# Patient Record
Sex: Female | Born: 1935 | Race: White | Hispanic: No | Smoking: Current every day smoker
Health system: Southern US, Community
[De-identification: ages and names within clinical notes are randomized; demographics above are authoritative.]

## PROBLEM LIST (undated history)

## (undated) DIAGNOSIS — K7460 Unspecified cirrhosis of liver: Secondary | ICD-10-CM

## (undated) DIAGNOSIS — IMO0002 Reserved for concepts with insufficient information to code with codable children: Secondary | ICD-10-CM

## (undated) DIAGNOSIS — Z923 Personal history of irradiation: Secondary | ICD-10-CM

## (undated) DIAGNOSIS — C50919 Malignant neoplasm of unspecified site of unspecified female breast: Secondary | ICD-10-CM

## (undated) DIAGNOSIS — M1990 Unspecified osteoarthritis, unspecified site: Secondary | ICD-10-CM

## (undated) DIAGNOSIS — Z972 Presence of dental prosthetic device (complete) (partial): Secondary | ICD-10-CM

## (undated) DIAGNOSIS — Z9221 Personal history of antineoplastic chemotherapy: Secondary | ICD-10-CM

## (undated) DIAGNOSIS — Z09 Encounter for follow-up examination after completed treatment for conditions other than malignant neoplasm: Secondary | ICD-10-CM

## (undated) DIAGNOSIS — C4490 Unspecified malignant neoplasm of skin, unspecified: Secondary | ICD-10-CM

## (undated) HISTORY — DX: Malignant neoplasm of unspecified site of unspecified female breast: C50.919

## (undated) HISTORY — PX: CHOLECYSTECTOMY: SHX55

---

## 1999-07-04 ENCOUNTER — Other Ambulatory Visit: Admission: RE | Admit: 1999-07-04 | Discharge: 1999-07-04 | Payer: Self-pay | Admitting: Obstetrics and Gynecology

## 2003-03-29 ENCOUNTER — Other Ambulatory Visit: Admission: RE | Admit: 2003-03-29 | Discharge: 2003-03-29 | Payer: Self-pay | Admitting: Obstetrics and Gynecology

## 2006-04-09 ENCOUNTER — Other Ambulatory Visit: Payer: Self-pay

## 2006-04-09 ENCOUNTER — Emergency Department: Payer: Self-pay | Admitting: Emergency Medicine

## 2009-06-01 DIAGNOSIS — C50919 Malignant neoplasm of unspecified site of unspecified female breast: Secondary | ICD-10-CM

## 2009-06-01 DIAGNOSIS — IMO0001 Reserved for inherently not codable concepts without codable children: Secondary | ICD-10-CM

## 2009-06-01 DIAGNOSIS — Z09 Encounter for follow-up examination after completed treatment for conditions other than malignant neoplasm: Secondary | ICD-10-CM

## 2009-06-01 HISTORY — DX: Malignant neoplasm of unspecified site of unspecified female breast: C50.919

## 2009-06-01 HISTORY — PX: BREAST EXCISIONAL BIOPSY: SUR124

## 2009-06-01 HISTORY — DX: Encounter for follow-up examination after completed treatment for conditions other than malignant neoplasm: Z09

## 2009-06-01 HISTORY — DX: Reserved for inherently not codable concepts without codable children: IMO0001

## 2009-11-29 ENCOUNTER — Ambulatory Visit: Payer: Self-pay | Admitting: Oncology

## 2009-12-23 ENCOUNTER — Ambulatory Visit: Payer: Self-pay | Admitting: Vascular Surgery

## 2009-12-24 ENCOUNTER — Ambulatory Visit: Payer: Self-pay | Admitting: Oncology

## 2009-12-30 ENCOUNTER — Ambulatory Visit: Payer: Self-pay | Admitting: Oncology

## 2010-01-02 ENCOUNTER — Inpatient Hospital Stay: Payer: Self-pay | Admitting: Internal Medicine

## 2010-01-30 ENCOUNTER — Ambulatory Visit: Payer: Self-pay | Admitting: Oncology

## 2010-03-01 ENCOUNTER — Ambulatory Visit: Payer: Self-pay | Admitting: Oncology

## 2010-03-15 ENCOUNTER — Inpatient Hospital Stay: Payer: Self-pay | Admitting: Oncology

## 2010-04-01 ENCOUNTER — Ambulatory Visit: Payer: Self-pay | Admitting: Oncology

## 2010-05-01 ENCOUNTER — Ambulatory Visit: Payer: Self-pay | Admitting: Oncology

## 2010-06-01 ENCOUNTER — Ambulatory Visit: Payer: Self-pay | Admitting: Oncology

## 2010-07-02 ENCOUNTER — Ambulatory Visit: Payer: Self-pay | Admitting: Oncology

## 2010-07-31 ENCOUNTER — Ambulatory Visit: Payer: Self-pay | Admitting: Oncology

## 2010-08-31 ENCOUNTER — Ambulatory Visit: Payer: Self-pay | Admitting: Oncology

## 2010-09-30 ENCOUNTER — Ambulatory Visit: Payer: Self-pay | Admitting: Oncology

## 2010-10-31 ENCOUNTER — Ambulatory Visit: Payer: Self-pay | Admitting: Oncology

## 2010-11-30 ENCOUNTER — Ambulatory Visit: Payer: Self-pay | Admitting: Oncology

## 2010-12-29 ENCOUNTER — Observation Stay: Payer: Self-pay | Admitting: Internal Medicine

## 2010-12-30 DIAGNOSIS — R079 Chest pain, unspecified: Secondary | ICD-10-CM

## 2010-12-31 ENCOUNTER — Ambulatory Visit: Payer: Self-pay | Admitting: Oncology

## 2011-01-31 ENCOUNTER — Ambulatory Visit: Payer: Self-pay | Admitting: Oncology

## 2011-02-23 ENCOUNTER — Ambulatory Visit: Payer: Self-pay | Admitting: Vascular Surgery

## 2011-03-02 ENCOUNTER — Ambulatory Visit: Payer: Self-pay | Admitting: Oncology

## 2011-05-14 ENCOUNTER — Ambulatory Visit: Payer: Self-pay | Admitting: Oncology

## 2011-06-02 ENCOUNTER — Ambulatory Visit: Payer: Self-pay | Admitting: Oncology

## 2011-08-18 ENCOUNTER — Ambulatory Visit: Payer: Self-pay | Admitting: Unknown Physician Specialty

## 2011-11-23 ENCOUNTER — Ambulatory Visit: Payer: Self-pay | Admitting: Oncology

## 2011-11-23 LAB — CBC CANCER CENTER
Basophil #: 0 x10 3/mm (ref 0.0–0.1)
HCT: 39.2 % (ref 35.0–47.0)
Lymphocyte #: 1.3 x10 3/mm (ref 1.0–3.6)
Lymphocyte %: 28.6 %
MCHC: 32.7 g/dL (ref 32.0–36.0)
MCV: 97 fL (ref 80–100)
Monocyte #: 0.3 x10 3/mm (ref 0.2–0.9)
Monocyte %: 7.3 %
Neutrophil %: 61.3 %
RDW: 14.3 % (ref 11.5–14.5)
WBC: 4.5 x10 3/mm (ref 3.6–11.0)

## 2011-11-30 ENCOUNTER — Ambulatory Visit: Payer: Self-pay | Admitting: Oncology

## 2012-05-31 ENCOUNTER — Ambulatory Visit: Payer: Self-pay | Admitting: Oncology

## 2012-05-31 LAB — CBC CANCER CENTER
Basophil #: 0 x10 3/mm (ref 0.0–0.1)
Eosinophil #: 0.1 x10 3/mm (ref 0.0–0.7)
Lymphocyte %: 32.7 %
MCHC: 33.6 g/dL (ref 32.0–36.0)
MCV: 95 fL (ref 80–100)
Neutrophil %: 55.4 %
RBC: 4.32 10*6/uL (ref 3.80–5.20)
RDW: 14.4 % (ref 11.5–14.5)
WBC: 3.8 x10 3/mm (ref 3.6–11.0)

## 2012-06-01 ENCOUNTER — Ambulatory Visit: Payer: Self-pay | Admitting: Oncology

## 2012-06-01 HISTORY — PX: BREAST BIOPSY: SHX20

## 2012-06-02 LAB — CANCER ANTIGEN 27.29: CA 27.29: 14.1 U/mL (ref 0.0–38.6)

## 2012-11-28 ENCOUNTER — Ambulatory Visit: Payer: Self-pay | Admitting: Oncology

## 2012-11-29 ENCOUNTER — Ambulatory Visit: Payer: Self-pay | Admitting: Oncology

## 2012-11-29 LAB — CANCER ANTIGEN 27.29: CA 27.29: 10.9 U/mL

## 2012-12-30 ENCOUNTER — Ambulatory Visit: Payer: Self-pay | Admitting: Oncology

## 2013-01-19 DIAGNOSIS — C50919 Malignant neoplasm of unspecified site of unspecified female breast: Secondary | ICD-10-CM | POA: Insufficient documentation

## 2013-10-04 ENCOUNTER — Ambulatory Visit: Payer: Self-pay | Admitting: Oncology

## 2013-10-05 LAB — CANCER ANTIGEN 27.29: CA 27.29: 12.6 U/mL (ref 0.0–38.6)

## 2013-10-30 ENCOUNTER — Ambulatory Visit: Payer: Self-pay | Admitting: Oncology

## 2013-12-27 ENCOUNTER — Ambulatory Visit: Payer: Self-pay | Admitting: Family Medicine

## 2014-01-31 ENCOUNTER — Ambulatory Visit: Payer: Self-pay | Admitting: Oncology

## 2014-03-01 ENCOUNTER — Ambulatory Visit: Payer: Self-pay | Admitting: Oncology

## 2014-04-11 ENCOUNTER — Ambulatory Visit: Payer: Self-pay | Admitting: Oncology

## 2014-04-19 LAB — CANCER ANTIGEN 27.29: CA 27.29: 6.3 U/mL (ref 0.0–38.6)

## 2014-05-01 ENCOUNTER — Ambulatory Visit: Payer: Self-pay | Admitting: Oncology

## 2014-05-30 DIAGNOSIS — R252 Cramp and spasm: Secondary | ICD-10-CM | POA: Insufficient documentation

## 2014-06-01 DIAGNOSIS — M8580 Other specified disorders of bone density and structure, unspecified site: Secondary | ICD-10-CM | POA: Insufficient documentation

## 2014-07-30 DIAGNOSIS — M79606 Pain in leg, unspecified: Secondary | ICD-10-CM | POA: Insufficient documentation

## 2014-07-30 DIAGNOSIS — M79669 Pain in unspecified lower leg: Secondary | ICD-10-CM | POA: Insufficient documentation

## 2014-10-19 ENCOUNTER — Inpatient Hospital Stay: Payer: Medicare Other | Attending: Oncology | Admitting: Oncology

## 2014-10-19 ENCOUNTER — Encounter: Payer: Self-pay | Admitting: Oncology

## 2014-10-19 VITALS — BP 161/70 | HR 60 | Temp 97.5°F | Resp 18 | Wt 176.6 lb

## 2014-10-19 DIAGNOSIS — Z170 Estrogen receptor positive status [ER+]: Secondary | ICD-10-CM | POA: Diagnosis not present

## 2014-10-19 DIAGNOSIS — M810 Age-related osteoporosis without current pathological fracture: Secondary | ICD-10-CM

## 2014-10-19 DIAGNOSIS — M8580 Other specified disorders of bone density and structure, unspecified site: Secondary | ICD-10-CM | POA: Diagnosis not present

## 2014-10-19 DIAGNOSIS — Z79899 Other long term (current) drug therapy: Secondary | ICD-10-CM | POA: Diagnosis not present

## 2014-10-19 DIAGNOSIS — Z79811 Long term (current) use of aromatase inhibitors: Secondary | ICD-10-CM | POA: Insufficient documentation

## 2014-10-19 DIAGNOSIS — D696 Thrombocytopenia, unspecified: Secondary | ICD-10-CM

## 2014-10-19 DIAGNOSIS — C50911 Malignant neoplasm of unspecified site of right female breast: Secondary | ICD-10-CM

## 2014-10-19 DIAGNOSIS — C50919 Malignant neoplasm of unspecified site of unspecified female breast: Secondary | ICD-10-CM | POA: Diagnosis present

## 2014-10-19 DIAGNOSIS — F17210 Nicotine dependence, cigarettes, uncomplicated: Secondary | ICD-10-CM | POA: Insufficient documentation

## 2014-11-01 ENCOUNTER — Telehealth: Payer: Self-pay | Admitting: Oncology

## 2014-11-01 NOTE — Telephone Encounter (Signed)
[name], I have called [facility] to request images.  Someone from [facility] will pick up the images and let you know when they can schedule.   FYI:  If [facility] knows previous images are done at [facility] they will request and pick up images themselves.     Thanks

## 2014-11-01 NOTE — Telephone Encounter (Signed)
Patient had her last mammogram at a different facility and we need to get images before we can schedule her next mammo. (Mammo was performed at [facility] across the [address] from hospital. [phone])

## 2014-11-02 DIAGNOSIS — C50919 Malignant neoplasm of unspecified site of unspecified female breast: Secondary | ICD-10-CM | POA: Insufficient documentation

## 2014-11-02 NOTE — Progress Notes (Signed)
[place]  [phone] Fax:([phone]) [phone]  ID: [name] OB: 1936/01/04  MR#: [id number]  UKG#:[id number]  No care team member to display  CHIEF COMPLAINT:  Chief Complaint  Patient presents with  . Follow-up    Breast Ca    INTERVAL HISTORY: Patient returns to clinic today for her routine six-month follow-up. She currently feels well and is asymptomatic. She denies any fevers.  She has no neurologic complaints.  She denies any chest pain, cough, or shortness of breath.  She has a good appetite and denies any nausea, vomiting, constipation, or diarrhea.  She has no urinary complaints.  Patient offers no specific complaints today.   REVIEW OF SYSTEMS:   Review of Systems  Constitutional: Negative.   Respiratory: Negative.   Cardiovascular: Negative.     As per HPI. Otherwise, a complete review of systems is negatve.  PAST MEDICAL HISTORY: Past Medical History  Diagnosis Date  . Breast cancer     PAST SURGICAL HISTORY: Past Surgical History  Procedure Laterality Date  . Cholecystectomy      FAMILY HISTORY: Father with prostate cancer, maternal aunt with cancer of unknown type.     ADVANCED DIRECTIVES:    HEALTH MAINTENANCE: History  Substance Use Topics  . Smoking status: Current Every Day Smoker -- 0.50 packs/day    Types: Cigarettes  . Smokeless tobacco: Never Used  . Alcohol Use: No     Comment: occasional glass of wine     Colonoscopy:  PAP:  Bone density:  Lipid panel:  No Known Allergies  Current Outpatient Prescriptions  Medication Sig Dispense Refill  . letrozole (FEMARA) 2.5 MG tablet      No current facility-administered medications for this visit.    OBJECTIVE: Filed Vitals:   10/19/14 1235  BP: 161/70  Pulse: 60  Temp: 97.5 F (36.4 C)  Resp: 18     There is no height on file to calculate BMI.    ECOG FS:0 - Asymptomatic  General: Well-developed, well-nourished, no acute distress. Eyes:  anicteric sclera. Breasts: Patient refused breast exam today. Lungs: Clear to auscultation bilaterally. Heart: Regular rate and rhythm. No rubs, murmurs, or gallops. Abdomen: Soft, nontender, nondistended. No organomegaly noted, normoactive bowel sounds. Musculoskeletal: No edema, cyanosis, or clubbing. Neuro: Alert, answering all questions appropriately. Cranial nerves grossly intact. Skin: No rashes or petechiae noted. Psych: Normal affect.   LAB RESULTS:  No results found for: NA, K, CL, CO2, GLUCOSE, BUN, CREATININE, CALCIUM, PROT, ALBUMIN, AST, ALT, ALKPHOS, BILITOT, GFRNONAA, GFRAA  Lab Results  Component Value Date   WBC 3.8 05/31/2012   NEUTROABS 2.1 05/31/2012   HGB 13.7 05/31/2012   HCT 40.9 05/31/2012   MCV 95 05/31/2012   PLT 121* 05/31/2012     STUDIES: No results found.  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  PLAN:    1.  Breast cancer:  Anastrozole was discontinued secondary to leg cramps and patient was switched to letrozole which she will complete in approximately October 2016.  Patient has not had a mammogram in greater than 1 year, therefore will schedule one in the next 1-2 weeks. Return to clinic in 6 months for routine evaluation. 2.  Osteopenia: Patient's bone mineral density on December 27, 2013 was reported -1.6.  Repeat in July 2016. 2.  Anxiety: Continue Xanax. 3.  Thrombocytopenia: Stable, secondary chemotherapy, monitor.  Patient expressed understanding and was in agreement with this plan. She also understands that She  can call clinic at any time with any questions, concerns, or complaints.   Breast cancer   Staging form: Breast, AJCC 7th Edition     Clinical stage from 11/02/2014: Stage IIA (T1b, N1, M0) - Signed by Lloyd Huger, MD on 11/02/2014   Lloyd Huger, MD   11/02/2014 4:05 PM

## 2014-11-15 NOTE — Telephone Encounter (Signed)
I contacted [facility] today to inquire about images on [facility] said they still had not received them and she could see the request hanging since June 2. She said she would call immediately to find out why they hadn't come yet.

## 2014-11-21 DIAGNOSIS — Q619 Cystic kidney disease, unspecified: Secondary | ICD-10-CM | POA: Diagnosis present

## 2014-11-21 DIAGNOSIS — R161 Splenomegaly, not elsewhere classified: Secondary | ICD-10-CM | POA: Insufficient documentation

## 2014-11-24 ENCOUNTER — Encounter: Payer: Self-pay | Admitting: Emergency Medicine

## 2014-11-24 ENCOUNTER — Emergency Department: Payer: Medicare Other

## 2014-11-24 DIAGNOSIS — Z720 Tobacco use: Secondary | ICD-10-CM | POA: Diagnosis not present

## 2014-11-24 DIAGNOSIS — M545 Low back pain: Secondary | ICD-10-CM | POA: Diagnosis not present

## 2014-11-24 DIAGNOSIS — N390 Urinary tract infection, site not specified: Secondary | ICD-10-CM | POA: Insufficient documentation

## 2014-11-24 DIAGNOSIS — Z792 Long term (current) use of antibiotics: Secondary | ICD-10-CM | POA: Diagnosis not present

## 2014-11-24 DIAGNOSIS — G8929 Other chronic pain: Secondary | ICD-10-CM | POA: Diagnosis not present

## 2014-11-24 DIAGNOSIS — R109 Unspecified abdominal pain: Secondary | ICD-10-CM | POA: Diagnosis present

## 2014-11-24 LAB — CBC
HCT: 41.9 % (ref 35.0–47.0)
HEMOGLOBIN: 14 g/dL (ref 12.0–16.0)
MCH: 31.2 pg (ref 26.0–34.0)
MCHC: 33.4 g/dL (ref 32.0–36.0)
MCV: 93.5 fL (ref 80.0–100.0)
Platelets: 124 10*3/uL — ABNORMAL LOW (ref 150–440)
RBC: 4.48 MIL/uL (ref 3.80–5.20)
RDW: 14.9 % — ABNORMAL HIGH (ref 11.5–14.5)
WBC: 6.6 10*3/uL (ref 3.6–11.0)

## 2014-11-24 LAB — COMPREHENSIVE METABOLIC PANEL
ALT: 33 U/L (ref 14–54)
AST: 45 U/L — AB (ref 15–41)
Albumin: 4.3 g/dL (ref 3.5–5.0)
Alkaline Phosphatase: 93 U/L (ref 38–126)
Anion gap: 11 (ref 5–15)
BUN: 24 mg/dL — ABNORMAL HIGH (ref 6–20)
CO2: 25 mmol/L (ref 22–32)
Calcium: 9.2 mg/dL (ref 8.9–10.3)
Chloride: 105 mmol/L (ref 101–111)
Creatinine, Ser: 0.78 mg/dL (ref 0.44–1.00)
GFR calc Af Amer: >60 mL/min (ref >60)
GFR calc non Af Amer: >60 mL/min (ref >60)
Glucose, Bld: 159 mg/dL — ABNORMAL HIGH (ref 65–99)
Potassium: 3.7 mmol/L (ref 3.5–5.1)
Sodium: 141 mmol/L (ref 135–145)
Total Bilirubin: 0.7 mg/dL (ref 0.3–1.2)
Total Protein: 7.3 g/dL (ref 6.5–8.1)

## 2014-11-24 LAB — URINALYSIS COMPLETE WITH MICROSCOPIC (ARMC ONLY)
BILIRUBIN URINE: NEGATIVE
Glucose, UA: NEGATIVE mg/dL
HGB URINE DIPSTICK: NEGATIVE
KETONES UR: NEGATIVE mg/dL
NITRITE: NEGATIVE
PROTEIN: NEGATIVE mg/dL
Specific Gravity, Urine: 1.029 (ref 1.005–1.030)
pH: 5 (ref 5.0–8.0)

## 2014-11-24 MED ORDER — ONDANSETRON 8 MG PO TBDP
8.0000 mg | ORAL_TABLET | Freq: Once | ORAL | Status: AC
  Administered 2014-11-24: 8 mg via ORAL

## 2014-11-24 MED ORDER — ONDANSETRON 8 MG PO TBDP
ORAL_TABLET | ORAL | Status: AC
  Administered 2014-11-24: 8 mg via ORAL
  Filled 2014-11-24: qty 1

## 2014-11-24 MED ORDER — OXYCODONE-ACETAMINOPHEN 5-325 MG PO TABS
ORAL_TABLET | ORAL | Status: AC
  Administered 2014-11-24: 1 via ORAL
  Filled 2014-11-24: qty 1

## 2014-11-24 MED ORDER — OXYCODONE-ACETAMINOPHEN 5-325 MG PO TABS
1.0000 | ORAL_TABLET | Freq: Once | ORAL | Status: AC
  Administered 2014-11-24: 1 via ORAL

## 2014-11-24 NOTE — ED Notes (Signed)
Pt presents to ER alert and in NAD. Pt reports she was told Wed that she had large kidney stones, pt had an [place] Wed. Pt states worse pain today.

## 2014-11-24 NOTE — ED Notes (Signed)
Pt reports she has rod in back and doesn't know if she can have CT scan, spoke with MD Thomasene Lot and he stated ok to CT scan.

## 2014-11-25 ENCOUNTER — Emergency Department
Admission: EM | Admit: 2014-11-25 | Discharge: 2014-11-25 | Disposition: A | Discharge status: Home / Self Care | Payer: Medicare Other | Attending: Emergency Medicine | Admitting: Emergency Medicine

## 2014-11-25 DIAGNOSIS — M545 Low back pain, unspecified: Secondary | ICD-10-CM

## 2014-11-25 DIAGNOSIS — N390 Urinary tract infection, site not specified: Secondary | ICD-10-CM | POA: Diagnosis not present

## 2014-11-25 DIAGNOSIS — R109 Unspecified abdominal pain: Secondary | ICD-10-CM

## 2014-11-25 MED ORDER — HYDROCODONE-ACETAMINOPHEN 5-325 MG PO TABS: 1.0000 | ORAL_TABLET | ORAL | Status: DC | PRN

## 2014-11-25 MED ORDER — CEPHALEXIN 500 MG PO CAPS
500.0000 mg | ORAL_CAPSULE | Freq: Once | ORAL | Status: AC
  Administered 2014-11-25: 500 mg via ORAL

## 2014-11-25 MED ORDER — CEPHALEXIN 500 MG PO CAPS
ORAL_CAPSULE | ORAL | Status: AC
  Filled 2014-11-25: qty 1

## 2014-11-25 MED ORDER — HYDROCODONE-ACETAMINOPHEN 5-325 MG PO TABS
2.0000 | ORAL_TABLET | Freq: Once | ORAL | Status: AC
  Administered 2014-11-25: 2 via ORAL

## 2014-11-25 MED ORDER — CEPHALEXIN 500 MG PO CAPS: 500.0000 mg | ORAL_CAPSULE | Freq: Two times a day (BID) | ORAL | Status: DC

## 2014-11-25 MED ORDER — HYDROCODONE-ACETAMINOPHEN 5-325 MG PO TABS
ORAL_TABLET | ORAL | Status: AC
  Filled 2014-11-25: qty 2

## 2014-11-25 NOTE — ED Provider Notes (Signed)
[facility] Emergency Department Provider Note  ____________________________________________  Time seen: Approximately 1:36 AM  I have reviewed the triage vital signs and the nursing notes.   HISTORY  Chief Complaint Flank Pain    HPI [name] is a 79 y.o. female with chronic lower back pain who presents with gradual onset of worsening acute bilateral lower back/flank pain.  She has been told in the past that she has large stones in her kidneys.  She denies dysuria.  She denies chest pain, shortness of breath, and abdominal pain.  She denies nausea and vomiting.  She states that at first she just thought it was worsening of her chronic pain, but was concerned about her history of kidney stones.  She says that she is cold a lot but denies fever.  She describes the pain as moderate.   Past Medical History  Diagnosis Date  . Breast cancer     Patient Active Problem List   Diagnosis Date Noted  . Breast cancer 11/02/2014    Past Surgical History  Procedure Laterality Date  . Cholecystectomy      Current Outpatient Rx  Name  Route  Sig  Dispense  Refill  . cephALEXin (KEFLEX) 500 MG capsule   Oral   Take 1 capsule (500 mg total) by mouth 2 (two) times daily.   14 capsule   0   . HYDROcodone-acetaminophen (NORCO/VICODIN) 5-325 MG per tablet   Oral   Take 1-2 tablets by mouth every 4 (four) hours as needed for moderate pain.   15 tablet   0   . letrozole (FEMARA) 2.5 MG tablet                 Allergies Review of patient's allergies indicates no known allergies.  History reviewed. No pertinent family history.  Social History History  Substance Use Topics  . Smoking status: Current Every Day Smoker -- 0.50 packs/day    Types: Cigarettes  . Smokeless tobacco: Never Used  . Alcohol Use: No     Comment: occasional glass of wine    Review of Systems Constitutional: No fever/chills Eyes: No visual changes. ENT: No sore  throat. Cardiovascular: Denies chest pain. Respiratory: Denies shortness of breath. Gastrointestinal: No abdominal pain.  No nausea, no vomiting.  No diarrhea.  No constipation. Genitourinary: Negative for dysuria. Musculoskeletal: Acute on chronic lower back pain bilaterally Skin: Negative for rash. Neurological: Negative for headaches, focal weakness or numbness.  10-point ROS otherwise negative.  ____________________________________________   PHYSICAL EXAM:  VITAL SIGNS: ED Triage Vitals  Enc Vitals Group     BP 11/24/14 2222 180/82 mmHg     Pulse Rate 11/24/14 2222 79     Resp 11/24/14 2222 20     Temp 11/24/14 2222 97.8 F (36.6 C)     Temp Source 11/24/14 2222 Oral     SpO2 11/24/14 2222 95 %     Weight 11/24/14 2222 171 lb (77.565 kg)     Height 11/24/14 2222 5\' 7"  (1.702 m)     Head Cir --      Peak Flow --      Pain Score 11/24/14 2224 10     Pain Loc --      Pain Edu? --      Excl. in [place]? --     Constitutional: Alert and oriented. Well appearing and in no acute distress. Eyes: Conjunctivae are normal. PERRL. EOMI. Head: Atraumatic. Nose: No congestion/rhinnorhea. Mouth/Throat: Mucous membranes are  moist.  Oropharynx non-erythematous. Neck: No stridor.   Cardiovascular: Normal rate, regular rhythm. Grossly normal heart sounds.  Good peripheral circulation. Respiratory: Normal respiratory effort.  No retractions. Lungs CTAB. Gastrointestinal: Soft and nontender. No distention. No abdominal bruits.  Mild bilateral CVA tenderness. Musculoskeletal: No lower extremity tenderness nor edema.  No joint effusions. Neurologic:  Normal speech and language. No gross focal neurologic deficits are appreciated. Speech is normal. Skin:  Skin is warm, dry and intact. No rash noted.   ____________________________________________   LABS (all labs ordered are listed, but only abnormal results are displayed)  Labs Reviewed  COMPREHENSIVE METABOLIC PANEL - Abnormal; Notable  for the following:    Glucose, Bld 159 (*)    BUN 24 (*)    AST 45 (*)    All other components within normal limits  URINALYSIS COMPLETEWITH MICROSCOPIC ([facility] ONLY) - Abnormal; Notable for the following:    Color, Urine YELLOW (*)    APPearance HAZY (*)    Leukocytes, UA 1+ (*)    Bacteria, UA RARE (*)    Squamous Epithelial / LPF 6-30 (*)    All other components within normal limits  CBC - Abnormal; Notable for the following:    RDW 14.9 (*)    Platelets 124 (*)    All other components within normal limits  URINE CULTURE   ____________________________________________  EKG  Not indicated ____________________________________________  RADIOLOGY  Ct Renal Stone Study  11/24/2014   CLINICAL DATA:  Acute onset of chronic lower back pain. Initial encounter.  EXAM: CT ABDOMEN AND PELVIS WITHOUT CONTRAST  TECHNIQUE: Multidetector CT imaging of the abdomen and pelvis was performed following the standard protocol without IV contrast.  COMPARISON:  CT of the abdomen and pelvis from 03/15/2010  FINDINGS: The visualized lung bases are clear.  Mildly nodular hepatic contour is compatible with cirrhosis. The spleen is enlarged, measuring 14.0 cm in length. The patient is status post cholecystectomy, with clips noted at the gallbladder fossa. The pancreas and adrenal glands are unremarkable. Gastric varices are seen.  Scattered left renal parapelvic cysts are seen. The kidneys are otherwise grossly unremarkable. There is no evidence of hydronephrosis. No renal or ureteral stones are seen. No perinephric stranding is appreciated.  No free fluid is identified. The small bowel is unremarkable in appearance. The stomach is within normal limits. No acute vascular abnormalities are seen. Relatively diffuse calcification is noted along the abdominal aorta and its branches.  The appendix is normal in caliber and contains trace air, without evidence for appendicitis. Scattered diverticulosis is noted along the  distal descending and proximal sigmoid colon, without evidence of diverticulitis.  The bladder is decompressed and not well assessed. The uterus is grossly unremarkable in appearance. The ovaries are relatively symmetric. No inguinal lymphadenopathy is seen.  No acute osseous abnormalities are identified. The patient is status post lumbar spinal fusion at L3-L5. There is diffuse osteopenia of visualized osseous structures.  IMPRESSION: 1. No acute abnormality seen to explain the patient's symptoms. 2. Findings of hepatic cirrhosis. Splenomegaly noted. Gastric varices seen. 3. Scattered left renal parapelvic cysts noted. 4. Relatively diffuse calcification along the abdominal aorta and its branches. 5. Scattered diverticulosis along the distal descending and proximal sigmoid colon, without evidence of diverticulitis. 6. Diffuse osteopenia of visualized osseous structures. Status post lumbar spinal fusion at L3-L5.   Electronically Signed   By: Garald Balding M.D.   On: 11/24/2014 23:30    ____________________________________________   PROCEDURES  Procedure(s) performed: None  Critical Care performed: No ____________________________________________   INITIAL IMPRESSION / ASSESSMENT AND PLAN / ED COURSE  Pertinent labs & imaging results that were available during my care of the patient were reviewed by me and considered in my medical decision making (see chart for details).  The patient is well-appearing and in no acute distress with reassuring vital signs.  I explained to her that she has no kidney stones on her CT scan.  I explained she may have a mild urinary tract infection that we will treat empirically with antibiotics while also sending the urine to culture.  I will treat her with a small number of Norco to help with her discomfort and advised her to follow up as soon as possible with her regular doctor.  I do not believe that she has an emergent medical condition.  She understands and agrees  with the plan.  ____________________________________________  FINAL CLINICAL IMPRESSION(S) / ED DIAGNOSES  Final diagnoses:  Bilateral low back pain without sciatica  UTI (urinary tract infection), uncomplicated      NEW MEDICATIONS STARTED DURING THIS VISIT:  New Prescriptions   CEPHALEXIN (KEFLEX) 500 MG CAPSULE    Take 1 capsule (500 mg total) by mouth 2 (two) times daily.   HYDROCODONE-ACETAMINOPHEN (NORCO/VICODIN) 5-325 MG PER TABLET    Take 1-2 tablets by mouth every 4 (four) hours as needed for moderate pain.     Hinda Kehr, MD 11/25/14 [phone]

## 2014-11-25 NOTE — Discharge Instructions (Signed)
As we discussed, your workup today was reassuring.  You may have a mild urinary tract infection for which we prescribed antibiotics and sent your urine for culture.  We are also providing a small number of stronger pain pills to help with your discomfort until you can follow-up with your primary care doctor.  If you develop new or worsening symptoms that concern you, please return to the emergency department.  We also provided a copy of your CT scan results to take your doctor; since there is no evidence of kidney stones, we thought you might want to pass this information along.   Back Pain, Adult Back pain is very common. The pain often gets better over time. The cause of back pain is usually not dangerous. Most people can learn to manage their back pain on their own.  HOME CARE   Stay active. Start with short walks on flat ground if you can. Try to walk farther each day.  Do not sit, drive, or stand in one place for more than 30 minutes. Do not stay in bed.  Do not avoid exercise or work. Activity can help your back heal faster.  Be careful when you bend or lift an object. Bend at your knees, keep the object close to you, and do not twist.  Sleep on a firm mattress. Lie on your side, and bend your knees. If you lie on your back, put a pillow under your knees.  Only take medicines as told by your doctor.  Put ice on the injured area.  Put ice in a plastic bag.  Place a towel between your skin and the bag.  Leave the ice on for 15-20 minutes, 03-04 times a day for the first 2 to 3 days. After that, you can switch between ice and heat packs.  Ask your doctor about back exercises or massage.  Avoid feeling anxious or stressed. Find good ways to deal with stress, such as exercise. GET HELP RIGHT AWAY IF:   Your pain does not go away with rest or medicine.  Your pain does not go away in 1 week.  You have new problems.  You do not feel well.  The pain spreads into your legs.  You  cannot control when you poop (bowel movement) or pee (urinate).  Your arms or legs feel weak or lose feeling (numbness).  You feel sick to your stomach (nauseous) or throw up (vomit).  You have belly (abdominal) pain.  You feel like you may pass out (faint). MAKE SURE YOU:   Understand these instructions.  Will watch your condition.  Will get help right away if you are not doing well or get worse. Document Released: 11/04/2007 Document Revised: 08/10/2011 Document Reviewed: 09/19/2013 [facility] Patient Information 2015 , [place]. This information is not intended to replace advice given to you by your health care provider. Make sure you discuss any questions you have with your health care provider.  Urinary Tract Infection Urinary tract infections (UTIs) can develop anywhere along your urinary tract. Your urinary tract is your body's drainage system for removing wastes and extra water. Your urinary tract includes two kidneys, two ureters, a bladder, and a urethra. Your kidneys are a pair of bean-shaped organs. Each kidney is about the size of your fist. They are located below your ribs, one on each side of your spine. CAUSES Infections are caused by microbes, which are microscopic organisms, including fungi, viruses, and bacteria. These organisms are so small that they can only  be seen through a microscope. Bacteria are the microbes that most commonly cause UTIs. SYMPTOMS  Symptoms of UTIs may vary by age and gender of the patient and by the location of the infection. Symptoms in young women typically include a frequent and intense urge to urinate and a painful, burning feeling in the bladder or urethra during urination. Older women and men are more likely to be tired, shaky, and weak and have muscle aches and abdominal pain. A fever may mean the infection is in your kidneys. Other symptoms of a kidney infection include pain in your back or sides below the ribs, nausea, and  vomiting. DIAGNOSIS To diagnose a UTI, your caregiver will ask you about your symptoms. Your caregiver also will ask to provide a urine sample. The urine sample will be tested for bacteria and white blood cells. White blood cells are made by your body to help fight infection. TREATMENT  Typically, UTIs can be treated with medication. Because most UTIs are caused by a bacterial infection, they usually can be treated with the use of antibiotics. The choice of antibiotic and length of treatment depend on your symptoms and the type of bacteria causing your infection. HOME CARE INSTRUCTIONS  If you were prescribed antibiotics, take them exactly as your caregiver instructs you. Finish the medication even if you feel better after you have only taken some of the medication.  Drink enough water and fluids to keep your urine clear or pale yellow.  Avoid caffeine, tea, and carbonated beverages. They tend to irritate your bladder.  Empty your bladder often. Avoid holding urine for long periods of time.  Empty your bladder before and after sexual intercourse.  After a bowel movement, women should cleanse from front to back. Use each tissue only once. SEEK MEDICAL CARE IF:   You have back pain.  You develop a fever.  Your symptoms do not begin to resolve within 3 days. SEEK IMMEDIATE MEDICAL CARE IF:   You have severe back pain or lower abdominal pain.  You develop chills.  You have nausea or vomiting.  You have continued burning or discomfort with urination. MAKE SURE YOU:   Understand these instructions.  Will watch your condition.  Will get help right away if you are not doing well or get worse. Document Released: 02/25/2005 Document Revised: 11/17/2011 Document Reviewed: 06/26/2011 [facility] Patient Information 2015 [place], [place]. This information is not intended to replace advice given to you by your health care provider. Make sure you discuss any questions you have with your health  care provider.

## 2014-11-27 LAB — URINE CULTURE: Special Requests: NORMAL

## 2014-11-28 ENCOUNTER — Encounter: Payer: Self-pay | Admitting: Urology

## 2014-11-28 ENCOUNTER — Ambulatory Visit (INDEPENDENT_AMBULATORY_CARE_PROVIDER_SITE_OTHER): Payer: Medicare Other | Admitting: Urology

## 2014-11-28 VITALS — BP 144/77 | HR 78 | Temp 98.7°F | Ht 67.0 in | Wt 171.6 lb

## 2014-11-28 DIAGNOSIS — M545 Low back pain, unspecified: Secondary | ICD-10-CM

## 2014-11-28 DIAGNOSIS — G8929 Other chronic pain: Secondary | ICD-10-CM | POA: Diagnosis not present

## 2014-11-28 DIAGNOSIS — N200 Calculus of kidney: Secondary | ICD-10-CM | POA: Diagnosis not present

## 2014-11-28 DIAGNOSIS — R351 Nocturia: Secondary | ICD-10-CM | POA: Diagnosis not present

## 2014-11-28 LAB — URINALYSIS, COMPLETE
BILIRUBIN UA: NEGATIVE
Glucose, UA: NEGATIVE
KETONES UA: NEGATIVE
Nitrite, UA: NEGATIVE
Specific Gravity, UA: >1.03 — ABNORMAL HIGH (ref 1.005–1.030)
Urobilinogen, Ur: 0.2 mg/dL (ref 0.2–1.0)
pH, UA: 6.5 (ref 5.0–7.5)

## 2014-11-28 LAB — MICROSCOPIC EXAMINATION

## 2014-11-28 LAB — BLADDER SCAN AMB NON-IMAGING: Scan Result: 47

## 2014-11-28 NOTE — Progress Notes (Signed)
11/28/2014 3:45 PM   [name] May 25, 1936 [id number]  Referring provider: Lavera Guise, MD [address] [place], [place] [address]  Chief Complaint  Patient presents with  . Flank Pain    bilateral fllank pain. the pt was seen in the ER x 3 days     HPI: 79 yo F presents for follow-up after recent emergency room visit on 11/25/2014.  She was referred to our office by her PCP a few weeks ago for bilateral flank pain. An in-office ultrasound (imaging is unavailable) noted to kidney stones which were too large to pass as well as an enlarged kidney per the patient. Over the past few weeks, her lower back pain has continued to worsen and she ultimately ended up in the emergency room 4 days ago. A CT abdomen and pelvis without contrast was obtained to rule out kidney stones which showed no evidence of nephrolithiasis bilaterally, no hydronephrosis, or if the any other concerning GU pathology.  She has lower mid back pain which was been present since Soso day.  The pain is 24-7 and worse when she gets up or moves.  She also has pain that shoots down her legs with associated numbness and tingling.  She does have a history of chronic back pain as well as a back injury requiring rod placement back in the 90s.  She denies any urinary enzymes including urinary urgency, frequency, or dysuria. No gross hematuria.  She does get up 2-3x nightly to void but drinks a lot just before bed and during the night when she wakes up.  She is not bothered by this.   She was dx with a UTI and urine culture from the ED on 11/24/14.  She never filled the prescription as she was complete asymptatic.  Urine culture showed mixed flora.     She denies a personal history of kidney stones.Marland Kitchen   PMH: Past Medical History  Diagnosis Date  . Breast cancer     Surgical History: Past Surgical History  Procedure Laterality Date  . Cholecystectomy      Home Medications:    Medication List       This list  is accurate as of: 11/28/14  3:45 PM.  Always use your most recent med list.               cephALEXin 500 MG capsule  Commonly known as:  KEFLEX  Take 1 capsule (500 mg total) by mouth 2 (two) times daily.     HYDROcodone-acetaminophen 5-325 MG per tablet  Commonly known as:  NORCO/VICODIN  Take 1-2 tablets by mouth every 4 (four) hours as needed for moderate pain.     letrozole 2.5 MG tablet  Commonly known as:  FEMARA        Allergies: No Known Allergies  Family History: No family history on file.  Social History:  reports that she has been smoking Cigarettes.  She has been smoking about 0.50 packs per day. She has never used smokeless tobacco. She reports that she does not drink alcohol or use illicit drugs.  ROS: Urological Symptom Review  Patient is experiencing the following symptoms: Get up at night to urinate   Review of Systems  Gastrointestinal (upper)  : Negative for upper GI symptoms  Gastrointestinal (lower) : Negative for lower GI symptoms  Constitutional : Weight loss  Skin: Negative for skin symptoms  Eyes: Negative for eye symptoms  Ear/Nose/Throat : Negative for Ear/Nose/Throat symptoms  Hematologic/Lymphatic: Negative for Hematologic/Lymphatic  symptoms  Cardiovascular : Negative for cardiovascular symptoms  Respiratory : Negative for respiratory symptoms  Endocrine: Negative for endocrine symptoms  Musculoskeletal: Back pain Joint pain  Neurological: Negative for neurological symptoms  Psychologic: Negative for psychiatric symptoms   Physical Exam: BP 144/77 mmHg  Pulse 78  Temp(Src) 98.7 F (37.1 C) (Oral)  Ht 5\' 7"  (1.702 m)  Wt 171 lb 9.6 oz (77.837 kg)  BMI 26.87 kg/m2  Constitutional:  Alert and oriented, No acute distress. HEENT: [place] AT, moist mucus membranes.  Trachea midline, no masses. Cardiovascular: No clubbing, cyanosis, or edema. Respiratory: Normal respiratory effort, no increased work of  breathing. GI: Abdomen is soft, nontender, nondistended, no abdominal masses GU: No CVA tenderness.  Skin: No rashes, bruises or suspicious lesions. Lymph: No cervical or inguinal adenopathy. Neurologic: Grossly intact, no focal deficits, moving all 4 extremities. Psychiatric: Normal mood and affect.  Laboratory Data: Lab Results  Component Value Date   WBC 6.6 11/24/2014   HGB 14.0 11/24/2014   HCT 41.9 11/24/2014   MCV 93.5 11/24/2014   PLT 124* 11/24/2014    Lab Results  Component Value Date   CREATININE 0.78 11/24/2014    Urinalysis    Component Value Date/Time   COLORURINE YELLOW* 11/24/2014 2234   APPEARANCEUR HAZY* 11/24/2014 2234   LABSPEC 1.029 11/24/2014 2234   PHURINE 5.0 11/24/2014 2234   GLUCOSEU NEGATIVE 11/24/2014 2234   HGBUR NEGATIVE 11/24/2014 2234   BILIRUBINUR NEGATIVE 11/24/2014 2234   KETONESUR NEGATIVE 11/24/2014 2234   PROTEINUR NEGATIVE 11/24/2014 2234   NITRITE NEGATIVE 11/24/2014 2234   LEUKOCYTESUR 1+* 11/24/2014 2234   Repeat UA today shows 6-10 white blood cells, 0-2 red blood cells per high-power field, 10 epithelial cells per high-powered field, nitrate negative.   Pertinent Imaging: CLINICAL DATA: Acute onset of chronic lower back pain. Initial encounter.  EXAM: CT ABDOMEN AND PELVIS WITHOUT CONTRAST  TECHNIQUE: Multidetector CT imaging of the abdomen and pelvis was performed following the standard protocol without IV contrast.  COMPARISON: CT of the abdomen and pelvis from 03/15/2010  FINDINGS: The visualized lung bases are clear.  Mildly nodular hepatic contour is compatible with cirrhosis. The spleen is enlarged, measuring 14.0 cm in length. The patient is status post cholecystectomy, with clips noted at the gallbladder fossa. The pancreas and adrenal glands are unremarkable. Gastric varices are seen.  Scattered left renal parapelvic cysts are seen. The kidneys are otherwise grossly unremarkable. There is no  evidence of hydronephrosis. No renal or ureteral stones are seen. No perinephric stranding is appreciated.  No free fluid is identified. The small bowel is unremarkable in appearance. The stomach is within normal limits. No acute vascular abnormalities are seen. Relatively diffuse calcification is noted along the abdominal aorta and its branches.  The appendix is normal in caliber and contains trace air, without evidence for appendicitis. Scattered diverticulosis is noted along the distal descending and proximal sigmoid colon, without evidence of diverticulitis.  The bladder is decompressed and not well assessed. The uterus is grossly unremarkable in appearance. The ovaries are relatively symmetric. No inguinal lymphadenopathy is seen.  No acute osseous abnormalities are identified. The patient is status post lumbar spinal fusion at L3-L5. There is diffuse osteopenia of visualized osseous structures.  IMPRESSION: 1. No acute abnormality seen to explain the patient's symptoms. 2. Findings of hepatic cirrhosis. Splenomegaly noted. Gastric varices seen. 3. Scattered left renal parapelvic cysts noted. 4. Relatively diffuse calcification along the abdominal aorta and its branches. 5. Scattered diverticulosis along the  distal descending and proximal sigmoid colon, without evidence of diverticulitis. 6. Diffuse osteopenia of visualized osseous structures. Status post lumbar spinal fusion at L3-L5.   Electronically Signed  By: Garald Balding M.D.  On: 11/24/2014 23:30     Assessment & Plan:  79 year old female with midline lower back pain which is more consistent with musculoskeletal lumbago presents today for evaluation of possible kidney stones seen on in-house ultrasound. Follow-up CT stone protocol shows no stones or no obvious kidney pathology. Her UA today is fairly unremarkable and recent urine culture was negative. I explained to her that I have no reason to suspect  that her pain is associated with any urological problem. She was advised to follow up with her PCP for further direction/ work up of back pain.  1. Chronic lower back pain See above  2. flank pain Unlikely GU in origin.  CT scan negative.  UA today not suspicious and recent negative culture. - Urinalysis, Complete   3. Nocturia Minimal bother.     Return if symptoms worsen or fail to improve.  Hollice Espy, MD  [facility] [address], [place], [place] [address] [phone]

## 2014-12-31 ENCOUNTER — Ambulatory Visit
Admission: RE | Admit: 2014-12-31 | Discharge: 2014-12-31 | Disposition: A | Discharge status: Home / Self Care | Payer: Medicare Other | Source: Ambulatory Visit | Attending: Oncology | Admitting: Oncology

## 2014-12-31 ENCOUNTER — Other Ambulatory Visit: Payer: Self-pay | Admitting: Oncology

## 2014-12-31 ENCOUNTER — Ambulatory Visit: Payer: Medicare Other

## 2014-12-31 DIAGNOSIS — M8580 Other specified disorders of bone density and structure, unspecified site: Secondary | ICD-10-CM | POA: Diagnosis not present

## 2014-12-31 DIAGNOSIS — Z853 Personal history of malignant neoplasm of breast: Secondary | ICD-10-CM | POA: Diagnosis present

## 2014-12-31 DIAGNOSIS — M810 Age-related osteoporosis without current pathological fracture: Secondary | ICD-10-CM | POA: Diagnosis present

## 2014-12-31 DIAGNOSIS — C50911 Malignant neoplasm of unspecified site of right female breast: Secondary | ICD-10-CM

## 2014-12-31 DIAGNOSIS — Z13820 Encounter for screening for osteoporosis: Secondary | ICD-10-CM | POA: Diagnosis present

## 2014-12-31 HISTORY — DX: Encounter for follow-up examination after completed treatment for conditions other than malignant neoplasm: Z09

## 2014-12-31 HISTORY — DX: Reserved for concepts with insufficient information to code with codable children: IMO0002

## 2015-01-02 ENCOUNTER — Ambulatory Visit (INDEPENDENT_AMBULATORY_CARE_PROVIDER_SITE_OTHER): Payer: Medicare Other | Admitting: Surgery

## 2015-01-02 ENCOUNTER — Encounter: Payer: Self-pay | Admitting: Surgery

## 2015-01-02 VITALS — BP 158/79 | HR 76 | Temp 98.3°F | Ht 67.0 in | Wt 171.0 lb

## 2015-01-02 DIAGNOSIS — D1720 Benign lipomatous neoplasm of skin and subcutaneous tissue of unspecified limb: Secondary | ICD-10-CM | POA: Diagnosis not present

## 2015-01-02 NOTE — H&P (Signed)
CC: Tender right arm, enlarging nodule   [name] is a pleasant 79 yo F with a history of breast cancer with LN involvement who presents with a history of a right arm nodule, anticubital fossa.  She says that it has been there for a while.  Has been enlarging per her report.  Often tender, often gets harder.  No fevers, chills, night sweats, shortness of breath, cough, chest pain, abdominal pain, nausea/vomiting, diarrhea/constipation, dysuria/hematuria.  Active Ambulatory Problems    Diagnosis Date Noted  . Breast cancer 11/02/2014  . Breast CA 01/19/2013   Resolved Ambulatory Problems    Diagnosis Date Noted  . No Resolved Ambulatory Problems   Past Medical History  Diagnosis Date  . Radiation 2011  . Chemotherapy follow-up examination 2011   Past Surgical History  Procedure Laterality Date  . Cholecystectomy    . Breast biopsy Right 2014    RT CORE W/CLIP - NEG     Medication List       This list is accurate as of: 01/02/15  1:25 PM.  Always use your most recent med list.               calcium gluconate 500 MG tablet  Take 1 tablet by mouth daily.     cephALEXin 500 MG capsule  Commonly known as:  KEFLEX  Take 1 capsule (500 mg total) by mouth 2 (two) times daily.     cholecalciferol 1000 UNITS tablet  Commonly known as:  VITAMIN D  Take 1,000 Units by mouth daily.     HYDROcodone-acetaminophen 5-325 MG per tablet  Commonly known as:  NORCO/VICODIN  Take 1-2 tablets by mouth every 4 (four) hours as needed for moderate pain.     letrozole 2.5 MG tablet  Commonly known as:  FEMARA     Magnesium 100 MG Tabs  Take 1 tablet by mouth daily.      No Known Allergies   History   Social History  . Marital Status: Divorced    Spouse Name: N/A  . Number of Children: N/A  . Years of Education: N/A   Occupational History  . Not on file.   Social History Main Topics  . Smoking status: Former Smoker -- 0.50 packs/day    Types: Cigarettes    Quit date:  04/28/2014  . Smokeless tobacco: Former Systems developer  . Alcohol Use: No     Comment: occasional glass of wine  . Drug Use: No  . Sexual Activity: Not on file   Other Topics Concern  . Not on file   Social History Narrative   Family History  Problem Relation Age of Onset  . Cancer Father     prostate  . Cancer Brother     prostate   Blood pressure 158/79, pulse 76, temperature 98.3 F (36.8 C), temperature source Oral, height 5\' 7"  (1.702 m), weight 77.565 kg (171 lb). GEN: NAD/A&Ox3 FACE: no obvious facial trauma, normal external nose, normal external ears EYES: no scleral icterus, no conjunctivitis HEAD: normocephalic atraumatic CV: RRR, no MRG RESP: moving air well, lungs clear ABD: soft, nontender, nondistended EXT: moving all ext well, strength 5/5, right anticubital fossa with approx 1.5 x 1.5 cm, nontender, mobile nodule, no hand/arm lesions NEURO: cnII-XII grossly intact, sensation intact all 4 ext  Labs: No new labs Imaging: No new imaging  A/P 80 yo with anticubital fossa nodule, intermittently nontender, growing.  Likely a lipoma, less likely a hypertropic lymph node but no hand/arm lesions.  I have told her that it probably benign.  I have offered excision if she would like due to discomfort and anxiety.  I would prefer to do it in the OR in case bleeding is encountered.  I have discussed the procedure including risks and benefits and she would like to proceed.

## 2015-01-02 NOTE — Progress Notes (Signed)
CC: Tender right arm, enlarging nodule   [name] is a pleasant 79 yo F with a history of breast cancer with LN involvement who presents with a history of a right arm nodule, anticubital fossa.  She says that it has been there for a while.  Has been enlarging per her report.  Often tender, often gets harder.  No fevers, chills, night sweats, shortness of breath, cough, chest pain, abdominal pain, nausea/vomiting, diarrhea/constipation, dysuria/hematuria.  Active Ambulatory Problems    Diagnosis Date Noted  . Breast cancer 11/02/2014  . Breast CA 01/19/2013   Resolved Ambulatory Problems    Diagnosis Date Noted  . No Resolved Ambulatory Problems   Past Medical History  Diagnosis Date  . Radiation 2011  . Chemotherapy follow-up examination 2011   Past Surgical History  Procedure Laterality Date  . Cholecystectomy    . Breast biopsy Right 2014    RT CORE W/CLIP - NEG     Medication List       This list is accurate as of: 01/02/15  1:25 PM.  Always use your most recent med list.               calcium gluconate 500 MG tablet  Take 1 tablet by mouth daily.     cephALEXin 500 MG capsule  Commonly known as:  KEFLEX  Take 1 capsule (500 mg total) by mouth 2 (two) times daily.     cholecalciferol 1000 UNITS tablet  Commonly known as:  VITAMIN D  Take 1,000 Units by mouth daily.     HYDROcodone-acetaminophen 5-325 MG per tablet  Commonly known as:  NORCO/VICODIN  Take 1-2 tablets by mouth every 4 (four) hours as needed for moderate pain.     letrozole 2.5 MG tablet  Commonly known as:  FEMARA     Magnesium 100 MG Tabs  Take 1 tablet by mouth daily.      No Known Allergies   History   Social History  . Marital Status: Divorced    Spouse Name: N/A  . Number of Children: N/A  . Years of Education: N/A   Occupational History  . Not on file.   Social History Main Topics  . Smoking status: Former Smoker -- 0.50 packs/day    Types: Cigarettes    Quit date:  04/28/2014  . Smokeless tobacco: Former Systems developer  . Alcohol Use: No     Comment: occasional glass of wine  . Drug Use: No  . Sexual Activity: Not on file   Other Topics Concern  . Not on file   Social History Narrative   Family History  Problem Relation Age of Onset  . Cancer Father     prostate  . Cancer Brother     prostate   Blood pressure 158/79, pulse 76, temperature 98.3 F (36.8 C), temperature source Oral, height 5\' 7"  (1.702 m), weight 77.565 kg (171 lb). GEN: NAD/A&Ox3 FACE: no obvious facial trauma, normal external nose, normal external ears EYES: no scleral icterus, no conjunctivitis HEAD: normocephalic atraumatic CV: RRR, no MRG RESP: moving air well, lungs clear ABD: soft, nontender, nondistended EXT: moving all ext well, strength 5/5, right anticubital fossa with approx 1.5 x 1.5 cm, nontender, mobile nodule, no hand/arm lesions NEURO: cnII-XII grossly intact, sensation intact all 4 ext  Labs: No new labs Imaging: No new imaging  A/P 79 yo with anticubital fossa nodule, intermittently nontender, growing.  Likely a lipoma, less likely a hypertropic lymph node but no hand/arm lesions.  I have told her that it probably benign.  I have offered excision if she would like due to discomfort and anxiety.  I would prefer to do it in the OR in case bleeding is encountered.  I have discussed the procedure including risks and benefits and she would like to proceed.

## 2015-01-02 NOTE — Patient Instructions (Signed)
We will see you in two weeks for [place] to do your surgery. Give [place] a call if you need [place] before then.

## 2015-01-03 ENCOUNTER — Telehealth: Payer: Self-pay | Admitting: Surgery

## 2015-01-03 NOTE — Telephone Encounter (Signed)
I called pt to go over surgery date. Pt stated that she would like to postpone surgery and will call back to reschedule at a later time. Dr Rexene Edison, Operating Room and pre admit has been advised.

## 2015-01-16 ENCOUNTER — Encounter: Admission: RE | Payer: Self-pay | Source: Ambulatory Visit

## 2015-01-16 ENCOUNTER — Ambulatory Visit: Admission: RE | Admit: 2015-01-16 | Payer: Medicare Other | Source: Ambulatory Visit | Admitting: Surgery

## 2015-01-16 SURGERY — EXCISION LIPOMA
Anesthesia: Choice | Laterality: Right

## 2015-02-14 ENCOUNTER — Telehealth: Payer: Self-pay | Admitting: Gastroenterology

## 2015-02-14 DIAGNOSIS — M545 Low back pain, unspecified: Secondary | ICD-10-CM | POA: Insufficient documentation

## 2015-02-14 NOTE — Telephone Encounter (Signed)
Colonoscopy triage °

## 2015-02-18 NOTE — Telephone Encounter (Signed)
Phoned patient for colon triage No answer, cant leave Voice mail will mail letter

## 2015-04-19 ENCOUNTER — Ambulatory Visit: Payer: Medicare Other | Admitting: Oncology

## 2015-04-19 ENCOUNTER — Other Ambulatory Visit: Payer: Medicare Other

## 2015-05-10 ENCOUNTER — Other Ambulatory Visit: Payer: Medicare Other

## 2015-05-10 ENCOUNTER — Ambulatory Visit: Payer: Medicare Other | Admitting: Oncology

## 2015-05-13 ENCOUNTER — Inpatient Hospital Stay: Payer: Medicare Other

## 2015-05-13 ENCOUNTER — Inpatient Hospital Stay: Payer: Medicare Other | Admitting: Oncology

## 2015-05-15 ENCOUNTER — Inpatient Hospital Stay: Payer: Medicare Other | Attending: Oncology

## 2015-05-15 ENCOUNTER — Inpatient Hospital Stay (HOSPITAL_BASED_OUTPATIENT_CLINIC_OR_DEPARTMENT_OTHER): Payer: Medicare Other | Admitting: Oncology

## 2015-05-15 VITALS — BP 155/82 | HR 64 | Temp 96.3°F | Resp 18 | Wt 176.8 lb

## 2015-05-15 DIAGNOSIS — Z170 Estrogen receptor positive status [ER+]: Secondary | ICD-10-CM

## 2015-05-15 DIAGNOSIS — Z87891 Personal history of nicotine dependence: Secondary | ICD-10-CM | POA: Insufficient documentation

## 2015-05-15 DIAGNOSIS — Z79899 Other long term (current) drug therapy: Secondary | ICD-10-CM | POA: Diagnosis not present

## 2015-05-15 DIAGNOSIS — D696 Thrombocytopenia, unspecified: Secondary | ICD-10-CM | POA: Diagnosis not present

## 2015-05-15 DIAGNOSIS — Z853 Personal history of malignant neoplasm of breast: Secondary | ICD-10-CM

## 2015-05-15 DIAGNOSIS — M8580 Other specified disorders of bone density and structure, unspecified site: Secondary | ICD-10-CM | POA: Diagnosis not present

## 2015-05-15 DIAGNOSIS — C50911 Malignant neoplasm of unspecified site of right female breast: Secondary | ICD-10-CM

## 2015-05-15 DIAGNOSIS — F419 Anxiety disorder, unspecified: Secondary | ICD-10-CM | POA: Insufficient documentation

## 2015-05-15 NOTE — Progress Notes (Signed)
Patient is having leg and they had not improved since stopping the Letrozole in 11/2014

## 2015-05-16 LAB — CANCER ANTIGEN 27.29: CA 27.29: 4.1 U/mL (ref 0.0–38.6)

## 2015-06-01 NOTE — Progress Notes (Signed)
[facility]  Telephone:([phone] [phone]) [phone]  ID: [name] OB: 1936/01/19  MR#: [id number]  XTK#:[id number]  Patient Care Team: Perrin Maltese, MD as PCP - General (Internal Medicine)  CHIEF COMPLAINT:  Chief Complaint  Patient presents with  . Breast Cancer    INTERVAL HISTORY: Patient returns to clinic today for her routine six-month follow-up. She continues to have leg cramps since discontinuing letrozole, but otherwise feels well. She currently feels well and is asymptomatic. She denies any fevers.  She has no neurologic complaints.  She denies any chest pain, cough, or shortness of breath.  She has a good appetite and denies any nausea, vomiting, constipation, or diarrhea.  She has no urinary complaints.  Patient offers no further specific complaints today.   REVIEW OF SYSTEMS:   Review of Systems  Constitutional: Negative.   Respiratory: Negative.   Cardiovascular: Negative.   Gastrointestinal: Negative.   Musculoskeletal:       Leg cramping.  Neurological: Negative.     As per HPI. Otherwise, a complete review of systems is negatve.  PAST MEDICAL HISTORY: Past Medical History  Diagnosis Date  . Breast cancer 2011    RT LUMPECTOMY  . Radiation 2011    RT BREAST CANCER  . Chemotherapy follow-up examination 2011    RT BREAST CANCER    PAST SURGICAL HISTORY: Past Surgical History  Procedure Laterality Date  . Cholecystectomy    . Breast biopsy Right 2014    RT CORE W/CLIP - NEG    FAMILY HISTORY: Father with prostate cancer, maternal aunt with cancer of unknown type.     ADVANCED DIRECTIVES:    HEALTH MAINTENANCE: Social History  Substance Use Topics  . Smoking status: Former Smoker -- 0.50 packs/day    Types: Cigarettes    Quit date: 04/28/2014  . Smokeless tobacco: Former Systems developer  . Alcohol Use: No     Comment: occasional glass of wine     Colonoscopy:  PAP:  Bone density:  Lipid panel:  No Known  Allergies  Current Outpatient Prescriptions  Medication Sig Dispense Refill  . calcium gluconate 500 MG tablet Take 1 tablet by mouth daily.    . cephALEXin (KEFLEX) 500 MG capsule Take 1 capsule (500 mg total) by mouth 2 (two) times daily. 14 capsule 0  . cholecalciferol (VITAMIN D) 1000 UNITS tablet Take 1,000 Units by mouth daily.    Marland Kitchen HYDROcodone-acetaminophen (NORCO/VICODIN) 5-325 MG per tablet Take 1-2 tablets by mouth every 4 (four) hours as needed for moderate pain. 15 tablet 0  . Magnesium 100 MG TABS Take 1 tablet by mouth daily.    Marland Kitchen letrozole [facility]) 2.5 MG tablet Reported on 05/15/2015     No current facility-administered medications for this visit.    OBJECTIVE: Filed Vitals:   05/15/15 1222  BP: 155/82  Pulse: 64  Temp: 96.3 F (35.7 C)  Resp: 18     Body mass index is 27.69 kg/(m^2).    ECOG FS:0 - Asymptomatic  General: Well-developed, well-nourished, no acute distress. Eyes: anicteric sclera. Breasts: Patient refused breast exam today. Lungs: Clear to auscultation bilaterally. Heart: Regular rate and rhythm. No rubs, murmurs, or gallops. Abdomen: Soft, nontender, nondistended. No organomegaly noted, normoactive bowel sounds. Musculoskeletal: No edema, cyanosis, or clubbing. Neuro: Alert, answering all questions appropriately. Cranial nerves grossly intact. Skin: No rashes or petechiae noted. Psych: Normal affect.   LAB RESULTS:  Lab Results  Component Value Date   NA 141 11/24/2014  K 3.7 11/24/2014   CL 105 11/24/2014   CO2 25 11/24/2014   GLUCOSE 159* 11/24/2014   BUN 24* 11/24/2014   CREATININE 0.78 11/24/2014   CALCIUM 9.2 11/24/2014   PROT 7.3 11/24/2014   ALBUMIN 4.3 11/24/2014   AST 45* 11/24/2014   ALT 33 11/24/2014   ALKPHOS 93 11/24/2014   BILITOT 0.7 11/24/2014   GFRNONAA >60 11/24/2014   GFRAA >60 11/24/2014    Lab Results  Component Value Date   WBC 6.6 11/24/2014   NEUTROABS 2.1 05/31/2012   HGB 14.0 11/24/2014   HCT  41.9 11/24/2014   MCV 93.5 11/24/2014   PLT 124* 11/24/2014     STUDIES: No results found.  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  PLAN:    1.  Breast cancer:  CA-27-29 is within normal limits at 4.1. Patient has now completed 5 years of an aromatase inhibitor. Patient complained of leg cramping on both anastrozole and letrozole which did not resolve upon completing her treatment. Patient's most recent mammogram on December 31, 2014 was reported as BI-RADS 2, repeat in one year. It was recommended patient follow-up on a yearly basis for continued monitoring, but she would prefer her primary care physician to continue to monitor and order her mammograms as needed. No further follow-up has been scheduled.  2.  Osteopenia: Patient's bone mineral density on December 31, 2014 was reported -1.7.  Which is essentially unchanged from one year prior.  2.  Anxiety: Continue Xanax. 3.  Thrombocytopenia: Stable, secondary chemotherapy, monitor.  Patient expressed understanding and was in agreement with this plan. She also understands that She can call clinic at any time with any questions, concerns, or complaints.   Breast cancer   Staging form: Breast, AJCC 7th Edition     Clinical stage from 11/02/2014: Stage IIA (T1b, N1, M0) - Signed by Lloyd Huger, MD on 11/02/2014   Lloyd Huger, MD   06/01/2015 11:15 AM

## 2015-10-01 ENCOUNTER — Other Ambulatory Visit (HOSPITAL_COMMUNITY): Payer: Self-pay | Admitting: Physician Assistant

## 2015-10-01 DIAGNOSIS — K7460 Unspecified cirrhosis of liver: Secondary | ICD-10-CM

## 2015-10-08 ENCOUNTER — Ambulatory Visit
Admission: RE | Admit: 2015-10-08 | Discharge: 2015-10-08 | Disposition: A | Discharge status: Home / Self Care | Payer: Medicare Other | Source: Ambulatory Visit | Attending: Physician Assistant | Admitting: Physician Assistant

## 2015-10-08 DIAGNOSIS — R938 Abnormal findings on diagnostic imaging of other specified body structures: Secondary | ICD-10-CM | POA: Insufficient documentation

## 2015-10-08 DIAGNOSIS — K3189 Other diseases of stomach and duodenum: Secondary | ICD-10-CM | POA: Diagnosis not present

## 2015-10-08 DIAGNOSIS — R161 Splenomegaly, not elsewhere classified: Secondary | ICD-10-CM | POA: Insufficient documentation

## 2015-10-08 DIAGNOSIS — I864 Gastric varices: Secondary | ICD-10-CM | POA: Insufficient documentation

## 2015-10-08 DIAGNOSIS — K5790 Diverticulosis of intestine, part unspecified, without perforation or abscess without bleeding: Secondary | ICD-10-CM | POA: Insufficient documentation

## 2015-10-08 DIAGNOSIS — K7460 Unspecified cirrhosis of liver: Secondary | ICD-10-CM | POA: Insufficient documentation

## 2015-10-08 LAB — POCT I-STAT CREATININE: Creatinine, Ser: 0.7 mg/dL (ref 0.44–1.00)

## 2015-10-08 MED ORDER — IOPAMIDOL (ISOVUE-300) INJECTION 61%
100.0000 mL | Freq: Once | INTRAVENOUS | Status: AC | PRN
  Administered 2015-10-08: 100 mL via INTRAVENOUS

## 2015-10-11 ENCOUNTER — Emergency Department: Payer: Medicare Other

## 2015-10-11 ENCOUNTER — Encounter: Payer: Self-pay | Admitting: *Deleted

## 2015-10-11 DIAGNOSIS — Z79899 Other long term (current) drug therapy: Secondary | ICD-10-CM | POA: Diagnosis not present

## 2015-10-11 DIAGNOSIS — J209 Acute bronchitis, unspecified: Secondary | ICD-10-CM | POA: Insufficient documentation

## 2015-10-11 DIAGNOSIS — Z87891 Personal history of nicotine dependence: Secondary | ICD-10-CM | POA: Diagnosis not present

## 2015-10-11 DIAGNOSIS — R0789 Other chest pain: Secondary | ICD-10-CM | POA: Diagnosis present

## 2015-10-11 LAB — COMPREHENSIVE METABOLIC PANEL
ALBUMIN: 4.6 g/dL (ref 3.5–5.0)
ALT: 44 U/L (ref 14–54)
ANION GAP: 6 (ref 5–15)
AST: 46 U/L — ABNORMAL HIGH (ref 15–41)
Alkaline Phosphatase: 101 U/L (ref 38–126)
BILIRUBIN TOTAL: 0.8 mg/dL (ref 0.3–1.2)
BUN: 25 mg/dL — ABNORMAL HIGH (ref 6–20)
CO2: 26 mmol/L (ref 22–32)
Calcium: 9.5 mg/dL (ref 8.9–10.3)
Chloride: 108 mmol/L (ref 101–111)
Creatinine, Ser: 0.62 mg/dL (ref 0.44–1.00)
GFR calc Af Amer: >60 mL/min (ref >60)
Glucose, Bld: 106 mg/dL — ABNORMAL HIGH (ref 65–99)
POTASSIUM: 4.2 mmol/L (ref 3.5–5.1)
Sodium: 140 mmol/L (ref 135–145)
Total Protein: 7.7 g/dL (ref 6.5–8.1)

## 2015-10-11 LAB — CBC
HEMATOCRIT: 42.6 % (ref 35.0–47.0)
Hemoglobin: 14.3 g/dL (ref 12.0–16.0)
MCH: 31.1 pg (ref 26.0–34.0)
MCHC: 33.5 g/dL (ref 32.0–36.0)
MCV: 92.8 fL (ref 80.0–100.0)
PLATELETS: 126 10*3/uL — AB (ref 150–440)
RBC: 4.6 MIL/uL (ref 3.80–5.20)
RDW: 14.6 % — AB (ref 11.5–14.5)
WBC: 6.4 10*3/uL (ref 3.6–11.0)

## 2015-10-11 LAB — TROPONIN I: Troponin I: <0.03 ng/mL (ref <0.031)

## 2015-10-11 NOTE — ED Notes (Signed)
Pt to triage via wheelchair.  Pt reports elevated blood pressure tonight.  Pt has chest pain and dizziness.  Sx began today.  No n/v/.  No diaphoresis.

## 2015-10-12 ENCOUNTER — Emergency Department
Admission: EM | Admit: 2015-10-12 | Discharge: 2015-10-12 | Disposition: A | Discharge status: Home / Self Care | Payer: Medicare Other | Attending: Emergency Medicine | Admitting: Emergency Medicine

## 2015-10-12 DIAGNOSIS — J209 Acute bronchitis, unspecified: Secondary | ICD-10-CM | POA: Diagnosis not present

## 2015-10-12 LAB — TROPONIN I: Troponin I: <0.03 ng/mL (ref <0.031)

## 2015-10-12 MED ORDER — PREDNISONE 20 MG PO TABS: 40.0000 mg | ORAL_TABLET | Freq: Every day | ORAL | Status: DC

## 2015-10-12 MED ORDER — AZITHROMYCIN 250 MG PO TABS: ORAL_TABLET | ORAL | Status: DC

## 2015-10-12 MED ORDER — ALBUTEROL SULFATE HFA 108 (90 BASE) MCG/ACT IN AERS: 2.0000 | INHALATION_SPRAY | RESPIRATORY_TRACT | Status: DC | PRN

## 2015-10-12 NOTE — Discharge Instructions (Signed)

## 2015-10-12 NOTE — ED Notes (Signed)
Pt denies dizziness or pain at this time. Pt denies shob at this time. Skin normal color warm and dry.

## 2015-10-12 NOTE — ED Provider Notes (Signed)
[facility] Emergency Department Provider Note  ____________________________________________  Time seen: 4:00 AM  I have reviewed the triage vital signs and the nursing notes.   HISTORY  Chief Complaint Hypertension and Chest Pain    HPI [name] is a 80 y.o. female who complains of dizziness and chest tightness that has been constant all day. Dizziness is described as motion and vertigo worse with rapid change in position or head movement. The chest tightness Much worse during the evening, but now feels better. She's also been having a productive cough worse in the mornings. No sore throat. For the last few weeks she has been having rhinorrhea and congestion along with the worsening cough. Denies fevers or syncope. No exertional symptoms, no pleuritic symptoms. Chest tightness is nonradiating and not associated with vomiting or diaphoresis. Does feel mildly short of breath.     Past Medical History  Diagnosis Date  . Breast cancer ([place]) 2011    RT LUMPECTOMY  . Radiation 2011    RT BREAST CANCER  . Chemotherapy follow-up examination 2011    RT BREAST CANCER     Patient Active Problem List   Diagnosis Date Noted  . Breast cancer ([place]) 11/02/2014  . Breast CA ([place]) 01/19/2013     Past Surgical History  Procedure Laterality Date  . Cholecystectomy    . Breast biopsy Right 2014    RT CORE W/CLIP - NEG     Current Outpatient Rx  Name  Route  Sig  Dispense  Refill  . albuterol (PROVENTIL HFA) 108 (90 Base) MCG/ACT inhaler   Inhalation   Inhale 2 puffs into the lungs every 4 (four) hours as needed for wheezing or shortness of breath.   1 Inhaler   0   . azithromycin (ZITHROMAX Z-PAK) 250 MG tablet      Take 2 tablets (500 mg) on  Day 1,  followed by 1 tablet (250 mg) once daily on Days 2 through 5.   6 each   0   . calcium gluconate 500 MG tablet   Oral   Take 1 tablet by mouth daily.         . cephALEXin (KEFLEX) 500 MG  capsule   Oral   Take 1 capsule (500 mg total) by mouth 2 (two) times daily.   14 capsule   0   . cholecalciferol (VITAMIN D) 1000 UNITS tablet   Oral   Take 1,000 Units by mouth daily.         Marland Kitchen HYDROcodone-acetaminophen (NORCO/VICODIN) 5-325 MG per tablet   Oral   Take 1-2 tablets by mouth every 4 (four) hours as needed for moderate pain.   15 tablet   0   . letrozole (FEMARA) 2.5 MG tablet      Reported on 05/15/2015         . Magnesium 100 MG TABS   Oral   Take 1 tablet by mouth daily.         . predniSONE (DELTASONE) 20 MG tablet   Oral   Take 2 tablets (40 mg total) by mouth daily.   8 tablet   0      Allergies Review of patient's allergies indicates no known allergies.   Family History  Problem Relation Age of Onset  . Cancer Father     prostate  . Cancer Brother     prostate    Social History Social History  Substance Use Topics  . Smoking status: Former Smoker --  0.50 packs/day    Types: Cigarettes    Quit date: 04/28/2014  . Smokeless tobacco: Former Systems developer  . Alcohol Use: No     Comment: occasional glass of wine    Review of Systems  Constitutional:   No fever or chills.  Eyes:   No vision changes.  ENT:   No sore throat. No rhinorrhea. Cardiovascular:   Positive as above chest pain. Respiratory:   Positive shortness of breath and productive cough. Gastrointestinal:   Negative for abdominal pain, vomiting and diarrhea.  Genitourinary:   Negative for dysuria or difficulty urinating. Musculoskeletal:   Negative for focal pain or swelling Neurological:   Negative for headaches 10-point ROS otherwise negative.  ____________________________________________   PHYSICAL EXAM:  VITAL SIGNS: ED Triage Vitals  Enc Vitals Group     BP 10/11/15 2216 166/74 mmHg     Pulse Rate 10/11/15 2216 56     Resp 10/11/15 2216 16     Temp 10/11/15 2216 97.6 F (36.4 C)     Temp Source 10/11/15 2216 Oral     SpO2 10/11/15 2216 96 %     Weight  10/11/15 2216 171 lb (77.565 kg)     Height 10/11/15 2216 5\' 8"  (1.727 m)     Head Cir --      Peak Flow --      Pain Score 10/11/15 2238 9     Pain Loc --      Pain Edu? --      Excl. in [place]? --     Vital signs reviewed, nursing assessments reviewed.   Constitutional:   Alert and oriented. Well appearing and in no distress. Eyes:   No scleral icterus. No conjunctival pallor. PERRL. EOMI.  No nystagmus. ENT   Head:   Normocephalic and atraumatic.   Nose:   No congestion/rhinnorhea. No septal hematoma   Mouth/Throat:   MMM, no pharyngeal erythema. No peritonsillar mass.    Neck:   No stridor. No SubQ emphysema. No meningismus. Hematological/Lymphatic/Immunilogical:   No cervical lymphadenopathy. Cardiovascular:   RRR. Symmetric bilateral radial and DP pulses.  No murmurs.  Respiratory:   Normal respiratory effort without tachypnea nor retractions. Good symmetric air entry in all lung fields. There is positive expiratory wheezing. On forceful rapid expiration there is accentuation of expiratory wheezing. No focal rhonchi Gastrointestinal:   Soft and nontender. Non distended. There is no CVA tenderness.  No rebound, rigidity, or guarding. Genitourinary:   deferred Musculoskeletal:   Nontender with normal range of motion in all extremities. No joint effusions.  No lower extremity tenderness.  No edema. Neurologic:   Normal speech and language.  CN 2-10 normal. Motor grossly intact. No gross focal neurologic deficits are appreciated.  Skin:    Skin is warm, dry and intact. No rash noted.  No petechiae, purpura, or bullae.  ____________________________________________    LABS (pertinent positives/negatives) (all labs ordered are listed, but only abnormal results are displayed) Labs Reviewed  CBC - Abnormal; Notable for the following:    RDW 14.6 (*)    Platelets 126 (*)    All other components within normal limits  COMPREHENSIVE METABOLIC PANEL - Abnormal; Notable for  the following:    Glucose, Bld 106 (*)    BUN 25 (*)    AST 46 (*)    All other components within normal limits  TROPONIN I  TROPONIN I   ____________________________________________   EKG  Interpreted by me Sinus bradycardia rate 55, normal axis intervals  QRS ST segments. Isolated T-wave inversion in V2 which is nonspecific ____________________________________________    RADIOLOGY  Chest x-ray unremarkable  ____________________________________________   PROCEDURES   ____________________________________________   INITIAL IMPRESSION / ASSESSMENT AND PLAN / ED COURSE  Pertinent labs & imaging results that were available during my care of the patient were reviewed by me and considered in my medical decision making (see chart for details).  Patient well appearing no acute distress. Calm and comfortable. Symptoms actually improving during her stay in the emergency department. Exam is consistent with acute bronchitis related to recent upper respiratory illness. She does not appear to have a significant pneumonia, she is not septic.Considering the patient's symptoms, medical history, and physical examination today, I have low suspicion for ACS, PE, TAD, pneumothorax, carditis, mediastinitis, pneumonia, CHF, or sepsis.  I'll treat her with prednisone and albuterol and azithromycin, strict return precautions given should her condition worsen or develop any new or concerning symptoms in any way, otherwise follow up closely with primary care this week.     ____________________________________________   FINAL CLINICAL IMPRESSION(S) / ED DIAGNOSES  Final diagnoses:  Acute bronchitis, unspecified organism       Portions of this note were generated with dragon dictation software. Dictation errors may occur despite best attempts at proofreading.   Carrie Mew, MD 10/12/15 [id number]

## 2015-10-12 NOTE — ED Notes (Signed)
Pt denies needs at this time. Pt states continues to feel dizzy if she moves her head quickly. Pt denies nausea or chest pain at this time.

## 2015-11-19 IMAGING — MG MM DIAG BREAST TOMO BILATERAL
8 of 13 series · 8 of 29 positions shown · non-contrast
Comparison: Priors

CLINICAL DATA: History of right lumpectomy for breast cancer 4566.
Asymptomatic.

EXAM:
DIGITAL DIAGNOSTIC bilateral MAMMOGRAM WITH 3D TOMOSYNTHESIS AND CAD

[R TAN]
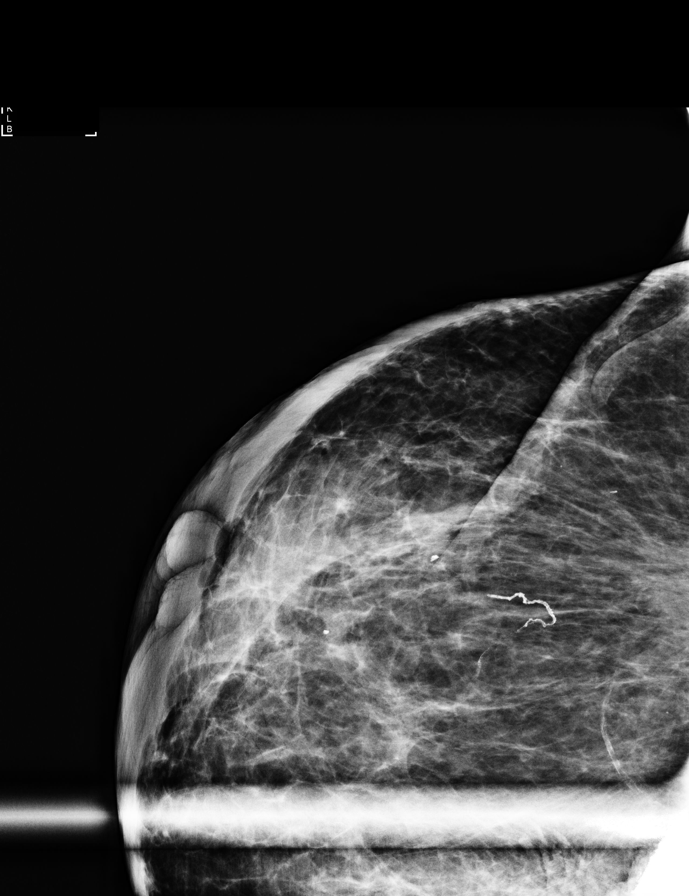

[L MLO synth-2D]
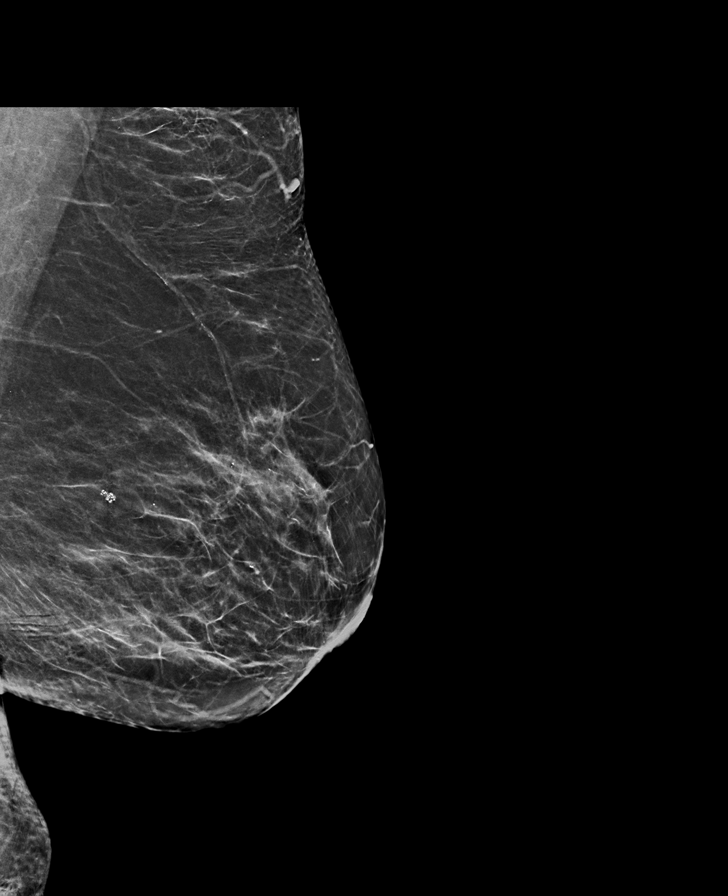

[L MLO]
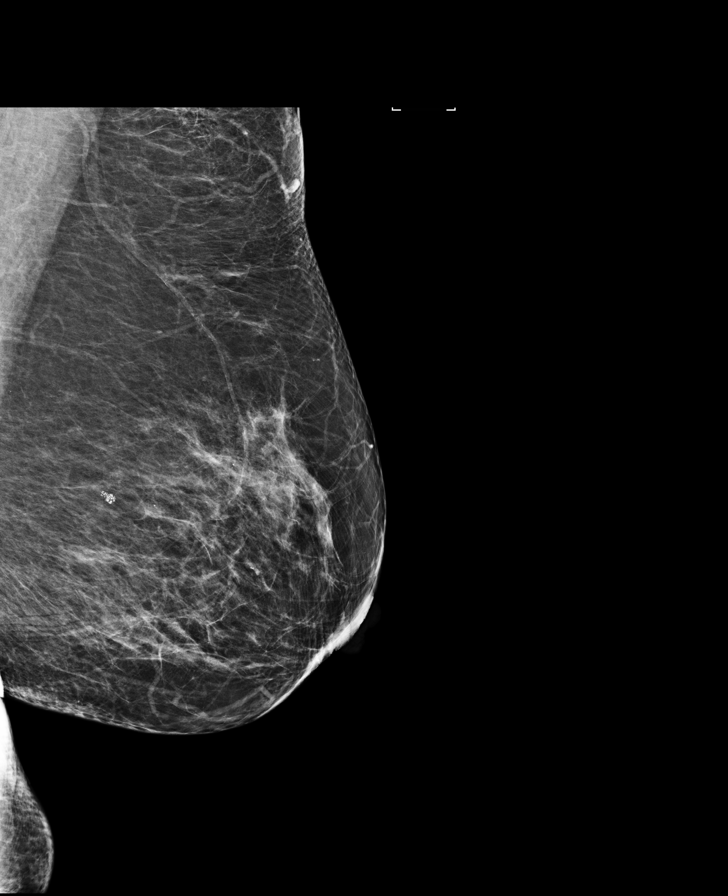

[R MLO synth-2D]
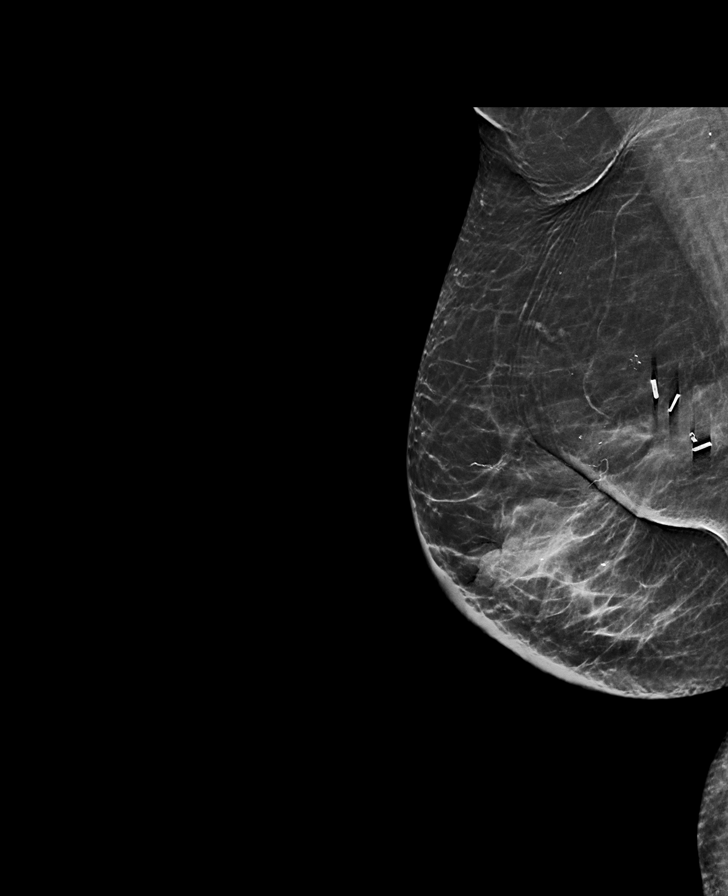

[L CC]
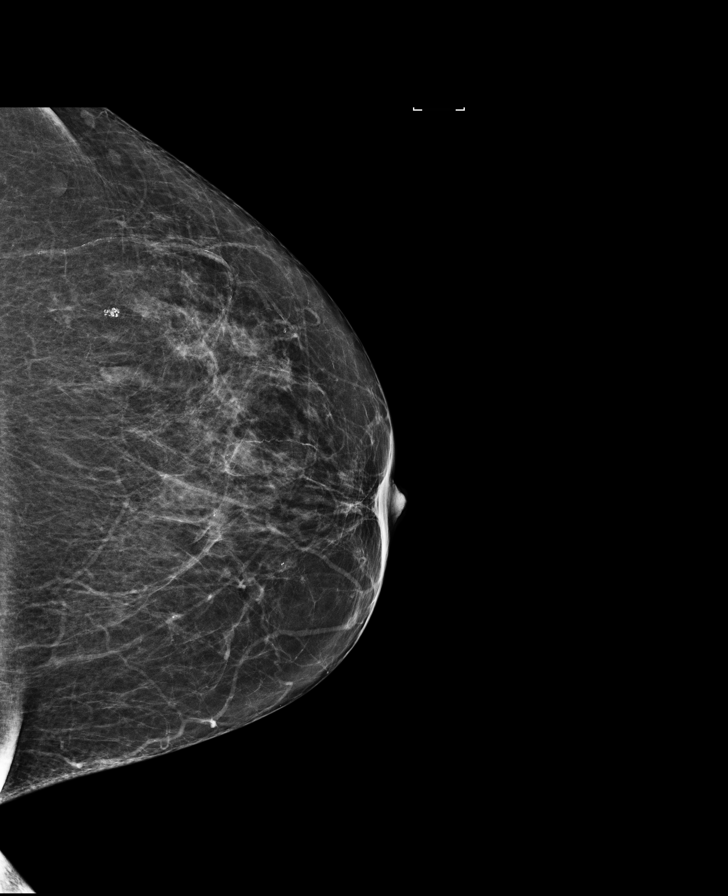

[R MLO]
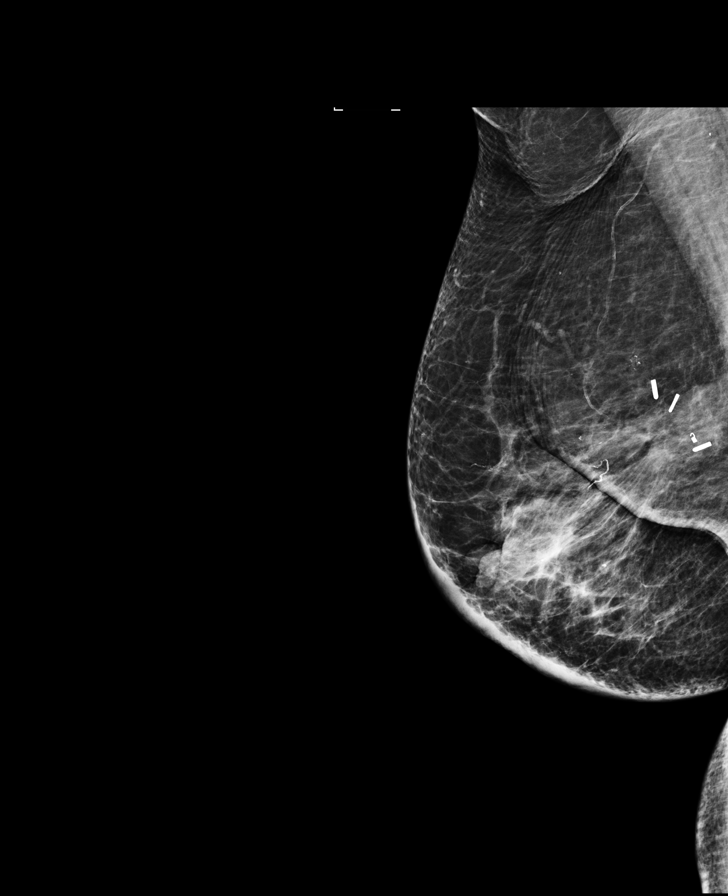

[L CC synth-2D]
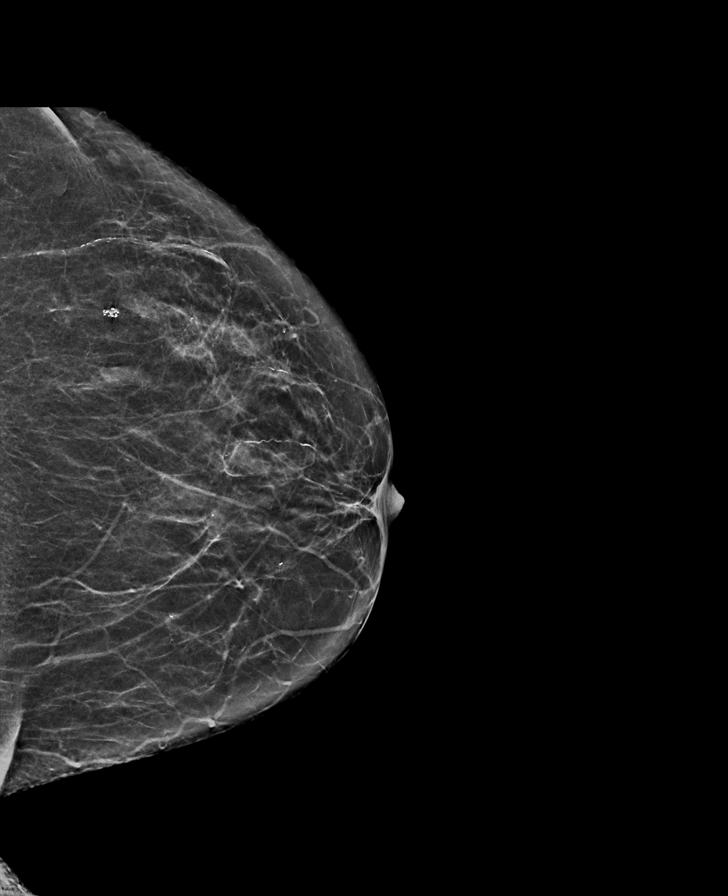

[R CC]
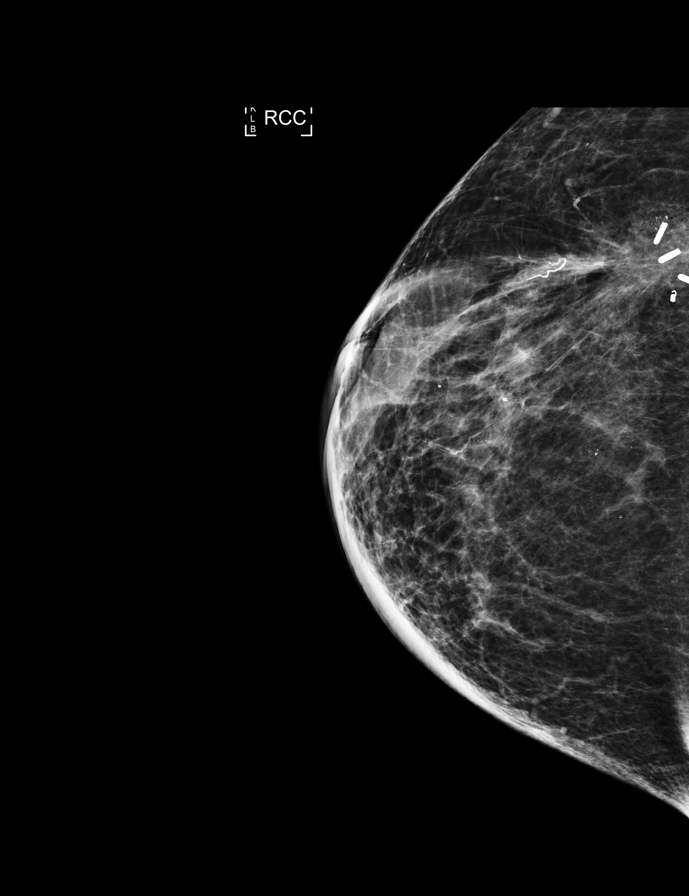

[8 of 29 positions shown; findings below may reference images not displayed]

ACR Breast Density Category c: The breast tissue is heterogeneously
dense, which may obscure small masses.
FINDINGS: Right lumpectomy and radiation change noted. No new suspicious
finding is seen in either breast.

Mammographic images were processed with CAD.
IMPRESSION: No evidence for malignancy in either breast

RECOMMENDATION:
Diagnostic mammogram is suggested in 1 year. (Code:[id number])

I have discussed the findings and recommendations with the patient.
Results were also provided in writing at the conclusion of the
visit. If applicable, a reminder letter will be sent to the patient
regarding the next appointment.

BI-RADS CATEGORY  2: Benign.

## 2015-11-20 ENCOUNTER — Ambulatory Visit: Admit: 2015-11-20 | Payer: Self-pay | Admitting: Unknown Physician Specialty

## 2015-11-20 SURGERY — COLONOSCOPY WITH PROPOFOL
Anesthesia: General

## 2016-04-07 NOTE — Discharge Instructions (Signed)

## 2016-04-09 ENCOUNTER — Encounter: Payer: Self-pay | Admitting: *Deleted

## 2016-04-15 ENCOUNTER — Ambulatory Visit
Admission: RE | Admit: 2016-04-15 | Discharge: 2016-04-15 | Disposition: A | Discharge status: Home / Self Care | Payer: Medicare Other | Source: Ambulatory Visit | Attending: Ophthalmology | Admitting: Ophthalmology

## 2016-04-15 ENCOUNTER — Ambulatory Visit: Payer: Medicare Other | Admitting: Anesthesiology

## 2016-04-15 ENCOUNTER — Encounter
Admission: RE | Disposition: A | Discharge status: Home / Self Care | Payer: Self-pay | Source: Ambulatory Visit | Attending: Ophthalmology

## 2016-04-15 DIAGNOSIS — H2511 Age-related nuclear cataract, right eye: Secondary | ICD-10-CM | POA: Diagnosis present

## 2016-04-15 DIAGNOSIS — F17200 Nicotine dependence, unspecified, uncomplicated: Secondary | ICD-10-CM | POA: Diagnosis not present

## 2016-04-15 DIAGNOSIS — M1990 Unspecified osteoarthritis, unspecified site: Secondary | ICD-10-CM | POA: Insufficient documentation

## 2016-04-15 DIAGNOSIS — K7460 Unspecified cirrhosis of liver: Secondary | ICD-10-CM | POA: Diagnosis not present

## 2016-04-15 HISTORY — PX: CATARACT EXTRACTION W/PHACO: SHX586

## 2016-04-15 HISTORY — DX: Presence of dental prosthetic device (complete) (partial): Z97.2

## 2016-04-15 HISTORY — DX: Unspecified osteoarthritis, unspecified site: M19.90

## 2016-04-15 HISTORY — DX: Unspecified cirrhosis of liver: K74.60

## 2016-04-15 SURGERY — PHACOEMULSIFICATION, CATARACT, WITH IOL INSERTION
Anesthesia: Monitor Anesthesia Care | Site: Eye | Laterality: Right | Wound class: Clean

## 2016-04-15 MED ORDER — NA HYALUR & NA CHOND-NA HYALUR 0.4-0.35 ML IO KIT
PACK | INTRAOCULAR | Status: DC | PRN
  Administered 2016-04-15: 1 mL via INTRAOCULAR

## 2016-04-15 MED ORDER — MIDAZOLAM HCL 2 MG/2ML IJ SOLN
INTRAMUSCULAR | Status: DC | PRN
  Administered 2016-04-15: 2 mg via INTRAVENOUS

## 2016-04-15 MED ORDER — BRIMONIDINE TARTRATE 0.2 % OP SOLN
OPHTHALMIC | Status: DC | PRN
  Administered 2016-04-15: 1 [drp] via OPHTHALMIC

## 2016-04-15 MED ORDER — LIDOCAINE HCL (PF) 4 % IJ SOLN
INTRAOCULAR | Status: DC | PRN
  Administered 2016-04-15: 1 mL via OPHTHALMIC

## 2016-04-15 MED ORDER — TIMOLOL MALEATE 0.5 % OP SOLN
OPHTHALMIC | Status: DC | PRN
  Administered 2016-04-15: 1 [drp] via OPHTHALMIC

## 2016-04-15 MED ORDER — EPINEPHRINE PF 1 MG/ML IJ SOLN
INTRAOCULAR | Status: DC | PRN
  Administered 2016-04-15: 63 mL via OPHTHALMIC

## 2016-04-15 MED ORDER — FENTANYL CITRATE (PF) 100 MCG/2ML IJ SOLN
INTRAMUSCULAR | Status: DC | PRN
  Administered 2016-04-15: 50 ug via INTRAVENOUS

## 2016-04-15 MED ORDER — MOXIFLOXACIN HCL 0.5 % OP SOLN
1.0000 [drp] | OPHTHALMIC | Status: DC | PRN
  Administered 2016-04-15 (×3): 1 [drp] via OPHTHALMIC

## 2016-04-15 MED ORDER — ARMC OPHTHALMIC DILATING DROPS
1.0000 "application " | OPHTHALMIC | Status: DC | PRN
  Administered 2016-04-15 (×3): 1 via OPHTHALMIC

## 2016-04-15 MED ORDER — CEFUROXIME OPHTHALMIC INJECTION 1 MG/0.1 ML
INJECTION | OPHTHALMIC | Status: DC | PRN
  Administered 2016-04-15: .3 mL via INTRACAMERAL

## 2016-04-15 SURGICAL SUPPLY — 25 items
CANNULA ANT/CHMB 27GA (MISCELLANEOUS) ×3 IMPLANT
CARTRIDGE ABBOTT (MISCELLANEOUS) IMPLANT
GLOVE SURG LX 7.5 STRW (GLOVE) ×2
GLOVE SURG LX STRL 7.5 STRW (GLOVE) ×1 IMPLANT
GLOVE SURG TRIUMPH 8.0 PF LTX (GLOVE) ×3 IMPLANT
GOWN STRL REUS W/ TWL LRG LVL3 (GOWN DISPOSABLE) ×2 IMPLANT
GOWN STRL REUS W/TWL LRG LVL3 (GOWN DISPOSABLE) ×4
LENS IOL TECNIS ITEC 18.0 (Intraocular Lens) ×3 IMPLANT
MARKER SKIN DUAL TIP RULER LAB (MISCELLANEOUS) ×3 IMPLANT
NDL RETROBULBAR .5 NSTRL (NEEDLE) IMPLANT
NEEDLE FILTER BLUNT 18X 1/2SAF (NEEDLE) ×2
NEEDLE FILTER BLUNT 18X1 1/2 (NEEDLE) ×1 IMPLANT
PACK CATARACT BRASINGTON (MISCELLANEOUS) ×3 IMPLANT
PACK EYE AFTER SURG (MISCELLANEOUS) ×3 IMPLANT
PACK OPTHALMIC (MISCELLANEOUS) ×3 IMPLANT
RING MALYGIN 7.0 (MISCELLANEOUS) IMPLANT
SUT ETHILON 10-0 CS-B-6CS-B-6 (SUTURE)
SUT VICRYL  9 0 (SUTURE)
SUT VICRYL 9 0 (SUTURE) IMPLANT
SUTURE EHLN 10-0 CS-B-6CS-B-6 (SUTURE) IMPLANT
SYR 3ML LL SCALE MARK (SYRINGE) ×3 IMPLANT
SYR 5ML LL (SYRINGE) ×3 IMPLANT
SYR TB 1ML LUER SLIP (SYRINGE) ×3 IMPLANT
WATER STERILE IRR 250ML POUR (IV SOLUTION) ×3 IMPLANT
WIPE NON LINTING 3.25X3.25 (MISCELLANEOUS) ×3 IMPLANT

## 2016-04-15 NOTE — H&P (Signed)
The History and Physical notes are on paper, have been signed, and are to be scanned. The patient remains stable and unchanged from the H&P.   Previous H&P reviewed, patient examined, and there are no changes.  [name] 04/15/2016 7:43 AM

## 2016-04-15 NOTE — Op Note (Signed)
LOCATION:  [place]   PREOPERATIVE DIAGNOSIS:    Nuclear sclerotic cataract right eye. H25.11   POSTOPERATIVE DIAGNOSIS:  Nuclear sclerotic cataract right eye.     PROCEDURE:  Phacoemusification with posterior chamber intraocular lens placement of the right eye   LENS:   Implant Name Type Inv. Item Serial No. Manufacturer Lot No. LRB No. Used  LENS IOL DIOP 18.0 - [id number] Intraocular Lens LENS IOL DIOP 18.0 [id number] AMO   Right 1        ULTRASOUND TIME: 22 % of 1 minutes, 24 seconds.  CDE 18.5   SURGEON:  Wyonia Hough, MD   ANESTHESIA:  Topical with tetracaine drops and 2% Xylocaine jelly, augmented with 1% preservative-free intracameral lidocaine.    COMPLICATIONS:  None.   DESCRIPTION OF PROCEDURE:  The patient was identified in the holding room and transported to the operating room and placed in the supine position under the operating microscope.  The right eye was identified as the operative eye and it was prepped and draped in the usual sterile ophthalmic fashion.   A 1 millimeter clear-corneal paracentesis was made at the 12:00 position.  0.5 ml of preservative-free 1% lidocaine was injected into the anterior chamber. The anterior chamber was filled with Viscoat viscoelastic.  A 2.4 millimeter keratome was used to make a near-clear corneal incision at the 9:00 position.  A curvilinear capsulorrhexis was made with a cystotome and capsulorrhexis forceps.  Balanced salt solution was used to hydrodissect and hydrodelineate the nucleus.   Phacoemulsification was then used in stop and chop fashion to remove the lens nucleus and epinucleus.  The remaining cortex was then removed using the irrigation and aspiration handpiece. Provisc was then placed into the capsular bag to distend it for lens placement.  A lens was then injected into the capsular bag.  The remaining viscoelastic was aspirated.   Wounds were hydrated with balanced salt solution.  The anterior  chamber was inflated to a physiologic pressure with balanced salt solution.  No wound leaks were noted. Cefuroxime 0.1 ml of a 10mg /ml solution was injected into the anterior chamber for a dose of 1 mg of intracameral antibiotic at the completion of the case.   Timolol and Brimonidine drops were applied to the eye.  The patient was taken to the recovery room in stable condition without complications of anesthesia or surgery.   Katryn Plummer 04/15/2016, 8:38 AM

## 2016-04-15 NOTE — Anesthesia Postprocedure Evaluation (Signed)
Anesthesia Post Note  Patient: [name]  Procedure(s) Performed: Procedure(s) (LRB): CATARACT EXTRACTION PHACO AND INTRAOCULAR LENS PLACEMENT (IOC) (Right)  Patient location during evaluation: PACU Anesthesia Type: MAC Level of consciousness: awake and alert Pain management: pain level controlled Vital Signs Assessment: post-procedure vital signs reviewed and stable Respiratory status: spontaneous breathing, nonlabored ventilation, respiratory function stable and patient connected to nasal cannula oxygen Cardiovascular status: stable and blood pressure returned to baseline Anesthetic complications: no    Alisa Graff

## 2016-04-15 NOTE — Anesthesia Preprocedure Evaluation (Signed)
Anesthesia Evaluation  Patient identified by MRN, date of birth, ID band Patient awake    Reviewed: Allergy & Precautions, H&P , NPO status , Patient's Chart, lab work & pertinent test results, reviewed documented beta blocker date and time   Airway Mallampati: II  TM Distance: >3 FB Neck ROM: full    Dental no notable dental hx.    Pulmonary neg pulmonary ROS, Current Smoker,    Pulmonary exam normal breath sounds clear to auscultation       Cardiovascular Exercise Tolerance: Good negative cardio ROS   Rhythm:regular Rate:Normal     Neuro/Psych negative neurological ROS  negative psych ROS   GI/Hepatic negative GI ROS, (+) Cirrhosis       ,   Endo/Other  negative endocrine ROS  Renal/GU negative Renal ROS  negative genitourinary   Musculoskeletal  (+) Arthritis ,   Abdominal   Peds  Hematology negative hematology ROS (+)   Anesthesia Other Findings   Reproductive/Obstetrics negative OB ROS                             Anesthesia Physical Anesthesia Plan  ASA: III  Anesthesia Plan: MAC   Post-op Pain Management:    Induction:   Airway Management Planned:   Additional Equipment:   Intra-op Plan:   Post-operative Plan:   Informed Consent: I have reviewed the patients History and Physical, chart, labs and discussed the procedure including the risks, benefits and alternatives for the proposed anesthesia with the patient or authorized representative who has indicated his/her understanding and acceptance.   Dental Advisory Given  Plan Discussed with: CRNA  Anesthesia Plan Comments:         Anesthesia Quick Evaluation

## 2016-04-15 NOTE — Transfer of Care (Signed)
Immediate Anesthesia Transfer of Care Note  Patient: [name]  Procedure(s) Performed: Procedure(s): CATARACT EXTRACTION PHACO AND INTRAOCULAR LENS PLACEMENT (IOC) (Right)  Patient Location: PACU  Anesthesia Type: MAC  Level of Consciousness: awake, alert  and patient cooperative  Airway and Oxygen Therapy: Patient Spontanous Breathing and Patient connected to supplemental oxygen  Post-op Assessment: Post-op Vital signs reviewed, Patient's Cardiovascular Status Stable, Respiratory Function Stable, Patent Airway and No signs of Nausea or vomiting  Post-op Vital Signs: Reviewed and stable  Complications: No apparent anesthesia complications

## 2016-04-15 NOTE — Anesthesia Procedure Notes (Signed)
Procedure Name: MAC Performed by: Karol Skarzynski Pre-anesthesia Checklist: Patient identified, Emergency Drugs available, Suction available, Timeout performed and Patient being monitored Patient Re-evaluated:Patient Re-evaluated prior to inductionOxygen Delivery Method: Nasal cannula Placement Confirmation: positive ETCO2     

## 2016-04-16 ENCOUNTER — Encounter: Payer: Self-pay | Admitting: Ophthalmology

## 2016-05-26 ENCOUNTER — Other Ambulatory Visit: Payer: Self-pay | Admitting: Internal Medicine

## 2016-05-26 DIAGNOSIS — Z853 Personal history of malignant neoplasm of breast: Secondary | ICD-10-CM

## 2016-05-28 ENCOUNTER — Other Ambulatory Visit: Payer: Self-pay | Admitting: *Deleted

## 2016-05-28 ENCOUNTER — Inpatient Hospital Stay
Admission: RE | Admit: 2016-05-28 | Discharge: 2016-05-28 | Disposition: A | Discharge status: Home / Self Care | Payer: Self-pay | Source: Ambulatory Visit | Attending: *Deleted | Admitting: *Deleted

## 2016-05-28 DIAGNOSIS — Z9289 Personal history of other medical treatment: Secondary | ICD-10-CM

## 2016-06-08 ENCOUNTER — Encounter: Payer: Self-pay | Admitting: *Deleted

## 2016-06-12 ENCOUNTER — Ambulatory Visit: Payer: Medicare Other

## 2016-06-12 ENCOUNTER — Other Ambulatory Visit: Payer: Medicare Other

## 2016-06-16 NOTE — Discharge Instructions (Signed)
Cataract Surgery, Care After °Refer to this sheet in the next few weeks. These instructions provide you with information about caring for yourself after your procedure. Your health care provider may also give you more specific instructions. Your treatment has been planned according to current medical practices, but problems sometimes occur. Call your health care provider if you have any problems or questions after your procedure. °What can I expect after the procedure? °After the procedure, it is common to have: °· Itching. °· Discomfort. °· Fluid discharge. °· Sensitivity to light and to touch. °· Bruising. °Follow these instructions at home: °Eye Care  °· Check your eye every day for signs of infection. Watch for: °¨ Redness, swelling, or pain. °¨ Fluid, blood, or pus. °¨ Warmth. °¨ Bad smell. °Activity  °· Avoid strenuous activities, such as playing contact sports, for as long as told by your health care provider. °· Do not drive or operate heavy machinery until your health care provider approves. °· Do not bend or lift heavy objects . Bending increases pressure in the eye. You can walk, climb stairs, and do light household chores. °· Ask your health care provider when you can return to work. If you work in a dusty environment, you may be advised to wear protective eyewear for a period of time. °General instructions  °· Take or apply over-the-counter and prescription medicines only as told by your health care provider. This includes eye drops. °· Do not touch or rub your eyes. °· If you were given a protective shield, wear it as told by your health care provider. If you were not given a protective shield, wear sunglasses as told by your health care provider to protect your eyes. °· Keep the area around your eye clean and dry. Avoid swimming or allowing water to hit you directly in the face while showering until told by your health care provider. Keep soap and shampoo out of your eyes. °· Do not put a contact lens  into the affected eye or eyes until your health care provider approves. °· Keep all follow-up visits as told by your health care provider. This is important. °Contact a health care provider if: ° °· You have increased bruising around your eye. °· You have pain that is not helped with medicine. °· You have a fever. °· You have redness, swelling, or pain in your eye. °· You have fluid, blood, or pus coming from your incision. °· Your vision gets worse. °Get help right away if: °· You have sudden vision loss. °This information is not intended to replace advice given to you by your health care provider. Make sure you discuss any questions you have with your health care provider. °Document Released: 12/05/2004 Document Revised: 09/26/2015 Document Reviewed: 03/28/2015 °Elsevier Interactive Patient Education © 2017 [employer] ° ° ° ° °General Anesthesia, Adult, Care After °These instructions provide you with information about caring for yourself after your procedure. Your health care provider may also give you more specific instructions. Your treatment has been planned according to current medical practices, but problems sometimes occur. Call your health care provider if you have any problems or questions after your procedure. °What can I expect after the procedure? °After the procedure, it is common to have: °· Vomiting. °· A sore throat. °· Mental slowness. °It is common to feel: °· Nauseous. °· Cold or shivery. °· Sleepy. °· Tired. °· Sore or achy, even in parts of your body where you did not have surgery. °Follow these instructions at   home: °For at least 24 hours after the procedure:  °· Do not: °¨ Participate in activities where you could fall or become injured. °¨ Drive. °¨ Use heavy machinery. °¨ Drink alcohol. °¨ Take sleeping pills or medicines that cause drowsiness. °¨ Make important decisions or sign legal documents. °¨ Take care of children on your own. °· Rest. °Eating and drinking  °· If you vomit, drink  water, juice, or soup when you can drink without vomiting. °· Drink enough fluid to keep your urine clear or pale yellow. °· Make sure you have little or no nausea before eating solid foods. °· Follow the diet recommended by your health care provider. °General instructions  °· Have a responsible adult stay with you until you are awake and alert. °· Return to your normal activities as told by your health care provider. Ask your health care provider what activities are safe for you. °· Take over-the-counter and prescription medicines only as told by your health care provider. °· If you smoke, do not smoke without supervision. °· Keep all follow-up visits as told by your health care provider. This is important. °Contact a health care provider if: °· You continue to have nausea or vomiting at home, and medicines are not helpful. °· You cannot drink fluids or start eating again. °· You cannot urinate after 8-12 hours. °· You develop a skin rash. °· You have fever. °· You have increasing redness at the site of your procedure. °Get help right away if: °· You have difficulty breathing. °· You have chest pain. °· You have unexpected bleeding. °· You feel that you are having a life-threatening or urgent problem. °This information is not intended to replace advice given to you by your health care provider. Make sure you discuss any questions you have with your health care provider. °Document Released: 08/24/2000 Document Revised: 10/21/2015 Document Reviewed: 05/02/2015 °Elsevier Interactive Patient Education © 2017 [employer] ° °

## 2016-06-25 ENCOUNTER — Encounter: Payer: Self-pay | Admitting: *Deleted

## 2016-07-01 ENCOUNTER — Encounter: Payer: Self-pay | Admitting: Student in an Organized Health Care Education/Training Program

## 2016-07-01 ENCOUNTER — Encounter
Admission: RE | Disposition: A | Discharge status: Home / Self Care | Payer: Self-pay | Source: Ambulatory Visit | Attending: Ophthalmology

## 2016-07-01 ENCOUNTER — Ambulatory Visit
Admission: RE | Admit: 2016-07-01 | Discharge: 2016-07-01 | Disposition: A | Discharge status: Home / Self Care | Payer: Medicare Other | Source: Ambulatory Visit | Attending: Ophthalmology | Admitting: Ophthalmology

## 2016-07-01 DIAGNOSIS — H2512 Age-related nuclear cataract, left eye: Secondary | ICD-10-CM | POA: Insufficient documentation

## 2016-07-01 HISTORY — PX: CATARACT EXTRACTION W/PHACO: SHX586

## 2016-07-01 SURGERY — PHACOEMULSIFICATION, CATARACT, WITH IOL INSERTION
Anesthesia: Monitor Anesthesia Care | Site: Eye | Laterality: Left | Wound class: Clean

## 2016-07-01 MED ORDER — MOXIFLOXACIN HCL 0.5 % OP SOLN
1.0000 [drp] | OPHTHALMIC | Status: DC | PRN
  Administered 2016-07-01 (×3): 1 [drp] via OPHTHALMIC

## 2016-07-01 MED ORDER — EPINEPHRINE PF 1 MG/ML IJ SOLN
INTRAMUSCULAR | Status: DC | PRN
  Administered 2016-07-01: 58 mL via OPHTHALMIC

## 2016-07-01 MED ORDER — BRIMONIDINE TARTRATE-TIMOLOL 0.2-0.5 % OP SOLN
OPHTHALMIC | Status: DC | PRN
  Administered 2016-07-01: 1 [drp] via OPHTHALMIC

## 2016-07-01 MED ORDER — BALANCED SALT IO SOLN
INTRAOCULAR | Status: DC | PRN
  Administered 2016-07-01: 2 mL via OPHTHALMIC

## 2016-07-01 MED ORDER — MIDAZOLAM HCL 2 MG/2ML IJ SOLN
INTRAMUSCULAR | Status: DC | PRN
  Administered 2016-07-01: 2 mg via INTRAVENOUS

## 2016-07-01 MED ORDER — MOXIFLOXACIN HCL 0.5 % OP SOLN: 1.0000 [drp] | OPHTHALMIC | Status: DC | PRN

## 2016-07-01 MED ORDER — ARMC OPHTHALMIC DILATING DROPS
1.0000 | OPHTHALMIC | Status: DC | PRN
Start: 2016-07-01 — End: 2016-07-01

## 2016-07-01 MED ORDER — CEFUROXIME OPHTHALMIC INJECTION 1 MG/0.1 ML
INJECTION | OPHTHALMIC | Status: DC | PRN
  Administered 2016-07-01: .3 mL via OPHTHALMIC

## 2016-07-01 MED ORDER — NA HYALUR & NA CHOND-NA HYALUR 0.4-0.35 ML IO KIT
PACK | INTRAOCULAR | Status: DC | PRN
  Administered 2016-07-01: 1 mL via INTRAOCULAR

## 2016-07-01 MED ORDER — FENTANYL CITRATE (PF) 100 MCG/2ML IJ SOLN
INTRAMUSCULAR | Status: DC | PRN
Start: 2016-07-01 — End: 2016-07-01
  Administered 2016-07-01: 50 ug via INTRAVENOUS

## 2016-07-01 MED ORDER — ARMC OPHTHALMIC DILATING DROPS
1.0000 "application " | OPHTHALMIC | Status: DC | PRN
  Administered 2016-07-01 (×3): 1 via OPHTHALMIC

## 2016-07-01 SURGICAL SUPPLY — 25 items
CANNULA ANT/CHMB 27GA (MISCELLANEOUS) ×3 IMPLANT
CARTRIDGE ABBOTT (MISCELLANEOUS) IMPLANT
GLOVE SURG LX 7.5 STRW (GLOVE) ×2
GLOVE SURG LX STRL 7.5 STRW (GLOVE) ×1 IMPLANT
GLOVE SURG TRIUMPH 8.0 PF LTX (GLOVE) ×3 IMPLANT
GOWN STRL REUS W/ TWL LRG LVL3 (GOWN DISPOSABLE) ×2 IMPLANT
GOWN STRL REUS W/TWL LRG LVL3 (GOWN DISPOSABLE) ×4
LENS IOL TECNIS ITEC 18.0 (Intraocular Lens) ×3 IMPLANT
MARKER SKIN DUAL TIP RULER LAB (MISCELLANEOUS) ×3 IMPLANT
NDL RETROBULBAR .5 NSTRL (NEEDLE) IMPLANT
NEEDLE FILTER BLUNT 18X 1/2SAF (NEEDLE) ×2
NEEDLE FILTER BLUNT 18X1 1/2 (NEEDLE) ×1 IMPLANT
PACK CATARACT BRASINGTON (MISCELLANEOUS) ×3 IMPLANT
PACK EYE AFTER SURG (MISCELLANEOUS) ×3 IMPLANT
PACK OPTHALMIC (MISCELLANEOUS) ×3 IMPLANT
RING MALYGIN 7.0 (MISCELLANEOUS) IMPLANT
SUT ETHILON 10-0 CS-B-6CS-B-6 (SUTURE)
SUT VICRYL  9 0 (SUTURE)
SUT VICRYL 9 0 (SUTURE) IMPLANT
SUTURE EHLN 10-0 CS-B-6CS-B-6 (SUTURE) IMPLANT
SYR 3ML LL SCALE MARK (SYRINGE) ×3 IMPLANT
SYR 5ML LL (SYRINGE) ×3 IMPLANT
SYR TB 1ML LUER SLIP (SYRINGE) ×3 IMPLANT
WATER STERILE IRR 250ML POUR (IV SOLUTION) ×3 IMPLANT
WIPE NON LINTING 3.25X3.25 (MISCELLANEOUS) ×3 IMPLANT

## 2016-07-01 NOTE — Transfer of Care (Signed)
Immediate Anesthesia Transfer of Care Note  Patient: [name]  Procedure(s) Performed: Procedure(s) with comments: CATARACT EXTRACTION PHACO AND INTRAOCULAR LENS PLACEMENT (IOC) (Left) - Left eye  Patient Location: PACU  Anesthesia Type: MAC  Level of Consciousness: awake, alert  and patient cooperative  Airway and Oxygen Therapy: Patient Spontanous Breathing and Patient connected to supplemental oxygen  Post-op Assessment: Post-op Vital signs reviewed, Patient's Cardiovascular Status Stable, Respiratory Function Stable, Patent Airway and No signs of Nausea or vomiting  Post-op Vital Signs: Reviewed and stable  Complications: No apparent anesthesia complications

## 2016-07-01 NOTE — Anesthesia Preprocedure Evaluation (Signed)
Anesthesia Evaluation  Patient identified by MRN, date of birth, ID band Patient awake    Reviewed: Allergy & Precautions, H&P , NPO status , Patient's Chart, lab work & pertinent test results, reviewed documented beta blocker date and time   Airway Mallampati: II  TM Distance: >3 FB Neck ROM: full    Dental  (+) Upper Dentures, Partial Lower   Pulmonary Current Smoker,    Pulmonary exam normal breath sounds clear to auscultation       Cardiovascular Exercise Tolerance: Good negative cardio ROS   Rhythm:regular Rate:Normal     Neuro/Psych negative neurological ROS  negative psych ROS   GI/Hepatic negative GI ROS, (+) Cirrhosis       ,   Endo/Other  negative endocrine ROS  Renal/GU negative Renal ROS  negative genitourinary   Musculoskeletal  (+) Arthritis ,   Abdominal   Peds  Hematology negative hematology ROS (+)   Anesthesia Other Findings   Reproductive/Obstetrics negative OB ROS                             Anesthesia Physical  Anesthesia Plan  ASA: II  Anesthesia Plan: MAC   Post-op Pain Management:    Induction:   Airway Management Planned:   Additional Equipment:   Intra-op Plan:   Post-operative Plan:   Informed Consent: I have reviewed the patients History and Physical, chart, labs and discussed the procedure including the risks, benefits and alternatives for the proposed anesthesia with the patient or authorized representative who has indicated his/her understanding and acceptance.   Dental Advisory Given  Plan Discussed with: CRNA  Anesthesia Plan Comments:         Anesthesia Quick Evaluation

## 2016-07-01 NOTE — H&P (Signed)
The History and Physical notes are on paper, have been signed, and are to be scanned. The patient remains stable and unchanged from the H&P.   Previous H&P reviewed, patient examined, and there are no changes.  Kristalynn Coddington 07/01/2016 8:44 AM

## 2016-07-01 NOTE — Op Note (Signed)
OPERATIVE NOTE  [name] [id number] 07/01/2016   PREOPERATIVE DIAGNOSIS:  Nuclear sclerotic cataract left eye. H25.12   POSTOPERATIVE DIAGNOSIS:    Nuclear sclerotic cataract left eye.     PROCEDURE:  Phacoemusification with posterior chamber intraocular lens placement of the left eye   LENS:   Implant Name Type Inv. Item Serial No. Manufacturer Lot No. LRB No. Used  LENS IOL DIOP 18.0 - [id number] Intraocular Lens LENS IOL DIOP 18.0 [id number] AMO   Left 1        ULTRASOUND TIME: 20  % of 1 minutes 16 seconds, CDE 15.5  SURGEON:  Wyonia Hough, MD   ANESTHESIA:  Topical with tetracaine drops and 2% Xylocaine jelly, augmented with 1% preservative-free intracameral lidocaine.    COMPLICATIONS:  None.   DESCRIPTION OF PROCEDURE:  The patient was identified in the holding room and transported to the operating room and placed in the supine position under the operating microscope.  The left eye was identified as the operative eye and it was prepped and draped in the usual sterile ophthalmic fashion.   A 1 millimeter clear-corneal paracentesis was made at the 1:30 position.  0.5 ml of preservative-free 1% lidocaine was injected into the anterior chamber.  The anterior chamber was filled with Viscoat viscoelastic.  A 2.4 millimeter keratome was used to make a near-clear corneal incision at the 10:30 position.  .  A curvilinear capsulorrhexis was made with a cystotome and capsulorrhexis forceps.  Balanced salt solution was used to hydrodissect and hydrodelineate the nucleus.   Phacoemulsification was then used in stop and chop fashion to remove the lens nucleus and epinucleus.  The remaining cortex was then removed using the irrigation and aspiration handpiece. Provisc was then placed into the capsular bag to distend it for lens placement.  A lens was then injected into the capsular bag.  The remaining viscoelastic was aspirated.   Wounds were hydrated with balanced salt  solution.  The anterior chamber was inflated to a physiologic pressure with balanced salt solution.  No wound leaks were noted. Cefuroxime 0.1 ml of a 10mg /ml solution was injected into the anterior chamber for a dose of 1 mg of intracameral antibiotic at the completion of the case.   Timolol and Brimonidine drops were applied to the eye.  The patient was taken to the recovery room in stable condition without complications of anesthesia or surgery.  [name] 07/01/2016, 9:34 AM

## 2016-07-01 NOTE — Anesthesia Postprocedure Evaluation (Signed)
Anesthesia Post Note  Patient: [name]  Procedure(s) Performed: Procedure(s) (LRB): CATARACT EXTRACTION PHACO AND INTRAOCULAR LENS PLACEMENT (IOC) (Left)  Patient location during evaluation: PACU Anesthesia Type: MAC Level of consciousness: awake and alert Pain management: pain level controlled Vital Signs Assessment: post-procedure vital signs reviewed and stable Respiratory status: spontaneous breathing, nonlabored ventilation, respiratory function stable and patient connected to nasal cannula oxygen Cardiovascular status: stable and blood pressure returned to baseline Anesthetic complications: no    Alisa Graff

## 2016-07-01 NOTE — Anesthesia Procedure Notes (Signed)
Procedure Name: MAC Performed by: Jason Hauge Pre-anesthesia Checklist: Patient identified, Emergency Drugs available, Suction available, Timeout performed and Patient being monitored Patient Re-evaluated:Patient Re-evaluated prior to inductionOxygen Delivery Method: Nasal cannula Placement Confirmation: positive ETCO2     

## 2016-07-16 ENCOUNTER — Other Ambulatory Visit: Payer: Medicare Other

## 2016-07-16 ENCOUNTER — Ambulatory Visit: Payer: Medicare Other | Attending: Internal Medicine

## 2016-10-06 ENCOUNTER — Ambulatory Visit
Admission: RE | Admit: 2016-10-06 | Discharge: 2016-10-06 | Disposition: A | Discharge status: Home / Self Care | Payer: Medicare Other | Source: Ambulatory Visit | Attending: Internal Medicine | Admitting: Internal Medicine

## 2016-10-06 DIAGNOSIS — Z853 Personal history of malignant neoplasm of breast: Secondary | ICD-10-CM

## 2016-10-06 DIAGNOSIS — Z9889 Other specified postprocedural states: Secondary | ICD-10-CM | POA: Insufficient documentation

## 2016-11-20 ENCOUNTER — Other Ambulatory Visit: Payer: Self-pay | Admitting: Internal Medicine

## 2016-11-20 ENCOUNTER — Ambulatory Visit
Admission: RE | Admit: 2016-11-20 | Discharge: 2016-11-20 | Disposition: A | Discharge status: Home / Self Care | Payer: Medicare Other | Source: Ambulatory Visit | Attending: Internal Medicine | Admitting: Internal Medicine

## 2016-11-20 DIAGNOSIS — M79605 Pain in left leg: Secondary | ICD-10-CM | POA: Insufficient documentation

## 2016-11-20 DIAGNOSIS — R51 Headache: Secondary | ICD-10-CM | POA: Insufficient documentation

## 2016-11-20 DIAGNOSIS — I6523 Occlusion and stenosis of bilateral carotid arteries: Secondary | ICD-10-CM | POA: Diagnosis not present

## 2016-11-20 DIAGNOSIS — R519 Headache, unspecified: Secondary | ICD-10-CM

## 2016-11-20 DIAGNOSIS — M79604 Pain in right leg: Secondary | ICD-10-CM | POA: Diagnosis present

## 2016-11-20 DIAGNOSIS — M79606 Pain in leg, unspecified: Secondary | ICD-10-CM

## 2016-11-20 DIAGNOSIS — R42 Dizziness and giddiness: Secondary | ICD-10-CM

## 2016-11-20 DIAGNOSIS — I6782 Cerebral ischemia: Secondary | ICD-10-CM | POA: Insufficient documentation

## 2017-01-19 ENCOUNTER — Other Ambulatory Visit
Admission: RE | Admit: 2017-01-19 | Discharge: 2017-01-19 | Disposition: A | Discharge status: Home / Self Care | Payer: Medicare Other | Source: Ambulatory Visit | Attending: Internal Medicine | Admitting: Internal Medicine

## 2017-01-19 ENCOUNTER — Encounter: Payer: Medicare Other | Attending: Internal Medicine | Admitting: Internal Medicine

## 2017-01-19 DIAGNOSIS — K7460 Unspecified cirrhosis of liver: Secondary | ICD-10-CM | POA: Insufficient documentation

## 2017-01-19 DIAGNOSIS — X58XXXD Exposure to other specified factors, subsequent encounter: Secondary | ICD-10-CM | POA: Insufficient documentation

## 2017-01-19 DIAGNOSIS — Z9221 Personal history of antineoplastic chemotherapy: Secondary | ICD-10-CM | POA: Diagnosis not present

## 2017-01-19 DIAGNOSIS — Z923 Personal history of irradiation: Secondary | ICD-10-CM | POA: Insufficient documentation

## 2017-01-19 DIAGNOSIS — Z853 Personal history of malignant neoplasm of breast: Secondary | ICD-10-CM | POA: Insufficient documentation

## 2017-01-19 DIAGNOSIS — J449 Chronic obstructive pulmonary disease, unspecified: Secondary | ICD-10-CM | POA: Diagnosis not present

## 2017-01-19 DIAGNOSIS — I87322 Chronic venous hypertension (idiopathic) with inflammation of left lower extremity: Secondary | ICD-10-CM | POA: Insufficient documentation

## 2017-01-19 DIAGNOSIS — S81812D Laceration without foreign body, left lower leg, subsequent encounter: Secondary | ICD-10-CM | POA: Insufficient documentation

## 2017-01-19 DIAGNOSIS — F17200 Nicotine dependence, unspecified, uncomplicated: Secondary | ICD-10-CM | POA: Insufficient documentation

## 2017-01-19 DIAGNOSIS — L0390 Cellulitis, unspecified: Secondary | ICD-10-CM | POA: Diagnosis present

## 2017-01-19 DIAGNOSIS — G629 Polyneuropathy, unspecified: Secondary | ICD-10-CM | POA: Insufficient documentation

## 2017-01-20 NOTE — Progress Notes (Signed)
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 Complaints and Symptoms: No Complaints or Symptoms Immunizations Pneumococcal Vaccine: Received Pneumococcal Vaccination: No Immunization Notes: up to date Family and Social History Cancer: No; Diabetes: No; Heart Disease: No; Hereditary Spherocytosis: No; Hypertension: No; Kidney Disease: No; Lung Disease: No; Seizures: No; Stroke: No; Thyroid Problems: No; Tuberculosis: No; Current every day smoker; Marital Status - Divorced; Alcohol Use: Never; Drug Use: No History; Caffeine Use: Daily; Financial Concerns: No; Food, Clothing  or Shelter Needs: No; Support  System Lacking: No; Transportation Concerns: No; Advanced Directives: No; Patient does not want information on Advanced Directives Electronic Signature(s) Signed: 01/19/2017 5:04:14 PM By: Montey Hora Signed: 01/19/2017 5:34:31 PM By: Linton Ham MD Entered By: Montey Hora on 01/19/2017 08:20:41 [name] ([id number]) -------------------------------------------------------------------------------- SuperBill Details Patient Name: [name]. Date of Service: 01/19/2017 Medical Record Patient Account Number: [id number] Number: Treating RN: Montey Hora Date of Birth/Sex: [birth year] (81 y.o. Female) Other Clinician: Primary Care Provider: Lamonte Sakai Treating Zonia Kief, Provider/Extender: G Referring Provider: Harless Nakayama in Treatment: 0 Diagnosis Coding ICD-10 Codes Code Description S81.812D Laceration without foreign body, left lower leg, subsequent encounter I87.322 Chronic venous hypertension (idiopathic) with inflammation of left lower extremity Facility Procedures CPT4: Description Modifier Quantity Code [id number] 99213 - WOUND CARE VISIT-LEV 3 EST PT 1 CPT4: 15726203 11042 - DEB SUBQ TISSUE 20 SQ CM/< 1 ICD-10 Description Diagnosis I87.322 Chronic venous hypertension (idiopathic) with inflammation of left lower extremity CPT4: 55974163 11045 - DEB SUBQ TISS EA ADDL 20CM 1 ICD-10 Description Diagnosis I87.322 Chronic venous hypertension (idiopathic) with inflammation of left lower extremity Physician Procedures CPT4: Description Modifier Quantity Code [id number] WC PHYS LEVEL 3 o NEW PT 25 1 ICD-10 Description Diagnosis S81.812D Laceration without foreign body, left lower leg, subsequent encounter I87.322 Chronic venous hypertension (idiopathic) with inflammation  of left lower extremity CPT4: 8032122 11042 - WC PHYS SUBQ TISS 20 SQ CM 1 Description HAGEN, TIDD ([id number]) Electronic Signature(s) Signed: 01/19/2017 9:57:04 AM  By: Montey Hora Signed: 01/19/2017 5:34:31 PM By: Linton Ham MD Entered By: Montey Hora on 01/19/2017 09:57:04

## 2017-01-20 NOTE — Progress Notes (Signed)
[name], [name] ([id number]) Visit Report for 01/19/2017 Abuse/Suicide Risk Screen Details Patient Name: [name], [name]. Date of Service: 01/19/2017 8:00 AM Medical Record Patient Account Number: [id number] Number: Treating RN: Montey Hora Date of Birth/Sex: 29-Apr-1936 (81 y.o. Female) Other Clinician: Primary Care Deamber Buckhalter: Lamonte Sakai Treating ROBSON, MICHAEL MCLEAN-SCOCOZZA, Wyatt Thorstenson/Extender: G Referring Hatice Bubel: Harless Nakayama in Treatment: 0 Abuse/Suicide Risk Screen Items Answer ABUSE/SUICIDE RISK SCREEN: Has anyone close to you tried to hurt or harm you recentlyo No Do you feel uncomfortable with anyone in your familyo No Has anyone forced you do things that you didnot want to doo No Do you have any thoughts of harming yourselfo No Patient displays signs or symptoms of abuse and/or neglect. No Electronic Signature(s) Signed: 01/19/2017 5:04:14 PM By: Montey Hora Entered By: Montey Hora on 01/19/2017 08:13:19 [name] ([id number]) -------------------------------------------------------------------------------- Activities of Daily Living Details Patient Name: [name]. Date of Service: 01/19/2017 8:00 AM Medical Record Patient Account Number: [id number] Number: Treating RN: Montey Hora Date of Birth/Sex: 30-Jan-1936 (81 y.o. Female) Other Clinician: Primary Care Caedan Sumler: Lamonte Sakai Treating Zonia Kief, Keimari /Extender: G Referring Jayline Kilburg: Harless Nakayama in Treatment: 0 Activities of Daily Living Items Answer Activities of Daily Living (Please select one for each item) Drive Automobile Completely Able Take Medications Completely Able Use Telephone Completely Able Care for Appearance Completely Able Use Toilet Completely Able Bath / Shower Completely Able Dress Self Completely Able Feed Self Completely Able Walk Completely Able Get In / Out Bed Completely Able Housework Completely Able Prepare  Meals Completely Able Handle Money Completely Able Shop for Self Completely Able Electronic Signature(s) Signed: 01/19/2017 5:04:14 PM By: Montey Hora Entered By: Montey Hora on 01/19/2017 08:13:49 [name] ([id number]) -------------------------------------------------------------------------------- Education Assessment Details Patient Name: [name]. Date of Service: 01/19/2017 8:00 AM Medical Record Patient Account Number: [id number] Number: Treating RN: Montey Hora Date of Birth/Sex: January 08, 1936 (81 y.o. Female) Other Clinician: Primary Care Azariya Freeman: Lamonte Sakai Treating ROBSON, Paulette Blanch, Garison Genova/Extender: G Referring Helmut Hennon: Harless Nakayama in Treatment: 0 Primary Learner Assessed: Caregiver Reason Patient is not Primary Learner: [facility] Learning Preferences/Education Level/Primary Language Learning Preference: Printed Material Highest Education Level: [employer] or Above Preferred Language: Diplomatic Services operational officer Assessment/Beliefs Language Barrier: No Translator Needed: No Memory Deficit: No Emotional Barrier: No Cultural/Religious Beliefs Affecting Medical No Care: Physical Barrier Assessment Impaired Vision: No Impaired Hearing: No Decreased Hand dexterity: No Knowledge/Comprehension Assessment Knowledge Level: Medium Comprehension Level: Medium Ability to understand written Medium instructions: Ability to understand verbal Medium instructions: Motivation Assessment Anxiety Level: Calm Cooperation: Cooperative Education Importance: Acknowledges Need Interest in Health Problems: Asks Questions Perception: Coherent Willingness to Engage in Self- Medium Management Activities: Medium [name], [name] ([id number]) Readiness to Engage in Self- Management Activities: Electronic Signature(s) Signed: 01/19/2017 5:04:14 PM By: Montey Hora Entered By: Montey Hora on 01/19/2017 08:14:38 [name]  ([id number]) -------------------------------------------------------------------------------- Fall Risk Assessment Details Patient Name: [name]. Date of Service: 01/19/2017 8:00 AM Medical Record Patient Account Number: [id number] Number: Treating RN: Montey Hora Date of Birth/Sex: August 02, 1935 (81 y.o. Female) Other Clinician: Primary Care Naesha Buckalew: Lamonte Sakai Treating ROBSON, Paulette Blanch, Marah Park/Extender: G Referring Daivd Fredericksen: Harless Nakayama in Treatment: 0 Fall Risk Assessment Items Have you had 2 or more falls in the last 12 monthso 0 No Have you had any fall that resulted in injury in the last 12 monthso 0 No FALL RISK ASSESSMENT: History of falling - immediate or within 3 months 0 No Secondary diagnosis 0  No Ambulatory aid None/bed rest/wheelchair/nurse 0 No Crutches/cane/walker 15 Yes Furniture 0 No IV Access/Saline Lock 0 No Gait/Training Normal/bed rest/immobile 0 No Weak 10 Yes Impaired 0 No Mental Status Oriented to own ability 0 Yes Electronic Signature(s) Signed: 01/19/2017 5:04:14 PM By: Montey Hora Entered By: Montey Hora on 01/19/2017 08:14:47 [name] ([id number]) -------------------------------------------------------------------------------- Nutrition Risk Assessment Details Patient Name: [name]. Date of Service: 01/19/2017 8:00 AM Medical Record Patient Account Number: [id number] Number: Treating RN: Montey Hora Date of Birth/Sex: 14-Oct-1935 (81 y.o. Female) Other Clinician: Primary Care Garvis Downum: Lamonte Sakai Treating ROBSON, Paulette Blanch, Keola Heninger/Extender: G Referring Nicollette Wilhelmi: Harless Nakayama in Treatment: 0 Height (in): Weight (lbs): Body Mass Index (BMI): Nutrition Risk Assessment Items NUTRITION RISK SCREEN: I have an illness or condition that made me change the kind and/or 0 No amount of food I eat I eat fewer than two meals per day 0 No I eat few fruits and  vegetables, or milk products 0 No I have three or more drinks of beer, liquor or wine almost every day 0 No I have tooth or mouth problems that make it hard for me to eat 0 No I don't always have enough money to buy the food I need 0 No I eat alone most of the time 0 No I take three or more different prescribed or over-the-counter drugs a 1 Yes day Without wanting to, I have lost or gained 10 pounds in the last six 0 No months I am not always physically able to shop, cook and/or feed myself 0 No Nutrition Protocols Good Risk Protocol 0 No interventions needed Moderate Risk Protocol Electronic Signature(s) Signed: 01/19/2017 5:04:14 PM By: Montey Hora Entered By: Montey Hora on 01/19/2017 08:14:53

## 2017-01-21 NOTE — Progress Notes (Signed)
PATTRICIA, WEIHER ([id number]) Visit Report for 01/19/2017 Allergy List Details Patient Name: [name], [name]. Date of Service: 01/19/2017 8:00 AM Medical Record Patient Account Number: [id number] Number: Treating RN: Montey Hora Date of Birth/Sex: 21-Oct-1935 (81 y.o. Female) Other Clinician: Primary Care Kaniyah Lisby: Lamonte Sakai Treating Zonia Kief, Deya Bigos/Extender: G Referring Zamani Crocker: Harless Nakayama in Treatment: 0 Allergies Active Allergies Iodinated Contrast- Oral and IV Dye Allergy Notes Electronic Signature(s) Signed: 01/19/2017 5:04:14 PM By: Montey Hora Entered By: Montey Hora on 01/19/2017 08:12:53 [name] ([id number]) -------------------------------------------------------------------------------- Arrival Information Details Patient Name: [name]. Date of Service: 01/19/2017 8:00 AM Medical Record Patient Account Number: [id number] Number: Treating RN: Montey Hora Date of Birth/Sex: 1935-10-03 (81 y.o. Female) Other Clinician: Primary Care Jullia Mulligan: Lamonte Sakai Treating Zonia Kief, Essence Merle/Extender: G Referring Brighid Koch: Harless Nakayama in Treatment: 0 Visit Information Patient Arrived: Walker Arrival Time: 08:10 Accompanied By: spouse Transfer Assistance: None Patient Identification Verified: Yes Secondary Verification Process Completed: Yes Electronic Signature(s) Signed: 01/19/2017 5:04:14 PM By: Montey Hora Entered By: Montey Hora on 01/19/2017 08:10:58 [name] ([id number]) -------------------------------------------------------------------------------- Clinic Level of Care Assessment Details Patient Name: [name]. Date of Service: 01/19/2017 8:00 AM Medical Record Patient Account Number: [id number] Number: Treating RN: Montey Hora Date of Birth/Sex: [birth year] (81 y.o. Female) Other Clinician: Primary Care Cniyah Sproull: Lamonte Sakai  Treating ROBSON, Paulette Blanch, Amethyst Gainer/Extender: G Referring Mel Langan: Harless Nakayama in Treatment: 0 Clinic Level of Care Assessment Items TOOL 1 Quantity Score []  - Use when EandM and Procedure is performed on INITIAL visit 0 ASSESSMENTS - Nursing Assessment / Reassessment X - General Physical Exam (combine w/ comprehensive assessment (listed just 1 20 below) when performed on new pt. evals) X - Comprehensive Assessment (HX, ROS, Risk Assessments, Wounds Hx, etc.) 1 25 ASSESSMENTS - Wound and Skin Assessment / Reassessment []  - Dermatologic / Skin Assessment (not related to wound area) 0 ASSESSMENTS - Ostomy and/or Continence Assessment and Care []  - Incontinence Assessment and Management 0 []  - Ostomy Care Assessment and Management (repouching, etc.) 0 PROCESS - Coordination of Care X - Simple Patient / Family Education for ongoing care 1 15 []  - Complex (extensive) Patient / Family Education for ongoing care 0 X - Staff obtains Programmer, systems, Records, Test Results / Process Orders 1 10 []  - Staff telephones HHA, Nursing Homes / Clarify orders / etc 0 []  - Routine Transfer to another Facility (non-emergent condition) 0 []  - Routine Hospital Admission (non-emergent condition) 0 X - New Admissions / Biomedical engineer / Ordering NPWT, Apligraf, etc. 1 15 []  - Emergency Hospital Admission (emergent condition) 0 PROCESS - Special Needs []  - Pediatric / Minor Patient Management 0 [name], [name] ([id number]) []  - Isolation Patient Management 0 []  - Hearing / Language / Visual special needs 0 []  - Assessment of Community assistance (transportation, D/C planning, etc.) 0 []  - Additional assistance / Altered mentation 0 []  - Support Surface(s) Assessment (bed, cushion, seat, etc.) 0 INTERVENTIONS - Miscellaneous []  - External ear exam 0 []  - Patient Transfer (multiple staff / Civil Service fast streamer / Similar devices) 0 []  - Simple Staple / Suture removal (25 or less) 0 []  - Complex  Staple / Suture removal (26 or more) 0 []  - Hypo/Hyperglycemic Management (do not check if billed separately) 0 []  - Ankle / Brachial Index (ABI) - do not check if billed separately 0 Has the patient been seen at the hospital within the last three years: Yes Total Score: 85 Level Of  Care: New/Established - Level 3 Electronic Signature(s) Signed: 01/19/2017 5:04:14 PM By: Montey Hora Entered By: Montey Hora on 01/19/2017 09:56:54 [name] ([id number]) -------------------------------------------------------------------------------- Encounter Discharge Information Details Patient Name: [name]. Date of Service: 01/19/2017 8:00 AM Medical Record Patient Account Number: [id number] Number: Treating RN: Montey Hora Date of Birth/Sex: [birth year] (81 y.o. Female) Other Clinician: Primary Care Breyonna Nault: Lamonte Sakai Treating ROBSON, Paulette Blanch, Ariane Ditullio/Extender: G Referring [place] Dube: Harless Nakayama in Treatment: 0 Encounter Discharge Information Items Discharge Pain Level: 0 Discharge Condition: Stable Ambulatory Status: Walker Discharge Destination: Home Transportation: Private Auto Accompanied By: spouse Schedule Follow-up Appointment: Yes Medication Reconciliation completed No and provided to Patient/Care Ulyana Pitones: Provided on Clinical Summary of Care: 01/19/2017 Form Type Recipient Paper Patient JG Electronic Signature(s) Signed: 01/19/2017 9:58:28 AM By: Montey Hora Entered By: Montey Hora on 01/19/2017 09:58:27 [name] ([id number]) -------------------------------------------------------------------------------- Lower Extremity Assessment Details Patient Name: [name]. Date of Service: 01/19/2017 8:00 AM Medical Record Patient Account Number: [id number] Number: Treating RN: Montey Hora Date of Birth/Sex: [birth year] (81 y.o. Female) Other Clinician: Primary Care Merina Behrendt: Lamonte Sakai Treating  ROBSON, MICHAEL MCLEAN-SCOCOZZA, Amare Bail/Extender: G Referring Brinleigh Tew: Harless Nakayama in Treatment: 0 Vascular Assessment Pulses: Dorsalis Pedis Palpable: [Left:Yes] Posterior Tibial Palpable: [Left:Yes] Extremity colors, hair growth, and conditions: Extremity Color: [Left:Mottled] Hair Growth on Extremity: [Left:No] Temperature of Extremity: [Left:Warm] Capillary Refill: [Left:< 3 seconds] Toe Nail Assessment Left: Right: Thick: No Discolored: No Deformed: No Improper Length and Hygiene: No Notes unable to obtain ABI R/T wound location and severe pain Electronic Signature(s) Signed: 01/19/2017 5:04:14 PM By: Montey Hora Entered By: Montey Hora on 01/19/2017 08:38:25 [name] ([id number]) -------------------------------------------------------------------------------- Multi Wound Chart Details Patient Name: [name]. Date of Service: 01/19/2017 8:00 AM Medical Record Patient Account Number: [id number] Number: Treating RN: Montey Hora Date of Birth/Sex: 1935/08/02 (81 y.o. Female) Other Clinician: Primary Care Panagiota Perfetti: Lamonte Sakai Treating ROBSON, MICHAEL MCLEAN-SCOCOZZA, Dachelle Molzahn/Extender: G Referring Makeisha Jentsch: Harless Nakayama in Treatment: 0 Vital Signs Height(in): 68 Pulse(bpm): 67 Weight(lbs): 173 Blood Pressure 112/63 (mmHg): Body Mass Index(BMI): 26 Temperature(F): 97.6 Respiratory Rate 16 (breaths/min): Photos: [1:No Photos] [N/A:N/A] Wound Location: [1:Left Lower Leg - Anterior] [N/A:N/A] Wounding Event: [1:Trauma] [N/A:N/A] Primary Etiology: [1:Trauma, Other] [N/A:N/A] Comorbid History: [1:Received Chemotherapy, Received Radiation] [N/A:N/A] Date Acquired: [1:01/07/2017] [N/A:N/A] Weeks of Treatment: [1:0] [N/A:N/A] Wound Status: [1:Open] [N/A:N/A] Measurements L x W x D 17.5x10.3x1.2 [N/A:N/A] (cm) Area (cm) : [1:141.568] [N/A:N/A] Volume (cm) : [1:169.882] [N/A:N/A] Classification: [1:Full Thickness With  Exposed Support Structures] [N/A:N/A] Exudate Amount: [1:Large] [N/A:N/A] Exudate Type: [1:Sanguinous] [N/A:N/A] Exudate Color: [1:red] [N/A:N/A] Foul Odor After [1:Yes] [N/A:N/A] Cleansing: Odor Anticipated Due to No [N/A:N/A] Product Use: Wound Margin: [1:Indistinct, nonvisible] [N/A:N/A] Granulation Amount: [1:Small (1-33%)] [N/A:N/A] Granulation Quality: [1:Red, Hyper-granulation] [N/A:N/A] Necrotic Amount: [1:Large (67-100%)] [N/A:N/A] Necrotic Tissue: [1:Eschar, Adherent Slough] [N/A:N/A] Exposed Structures: Fat Layer (Subcutaneous N/A N/A Tissue) Exposed: Yes Fascia: No Tendon: No Muscle: No Joint: No Bone: No Epithelialization: None N/A N/A Debridement: Debridement ([id number]- N/A N/A 11047) Pre-procedure 08:43 N/A N/A Verification/Time Out Taken: Pain Control: Lidocaine 4% Topical N/A N/A Solution Tissue Debrided: Fibrin/Slough, Skin, N/A N/A Subcutaneous Level: Skin/Subcutaneous N/A N/A Tissue Debridement Area (sq 25 N/A N/A cm): Instrument: Forceps, Scissors N/A N/A Specimen: Swab N/A N/A Number of Specimens 1 N/A N/A Taken: Bleeding: None N/A N/A Procedural Pain: 8 N/A N/A Post Procedural Pain: 0 N/A N/A Debridement Treatment Procedure was tolerated N/A N/A Response: well Post Debridement 17.5x10.3x1.6 N/A N/A Measurements L x W x D (cm)  Post Debridement 226.509 N/A N/A Volume: (cm) Periwound Skin Texture: Excoriation: No N/A N/A Induration: No Callus: No Crepitus: No Rash: No Scarring: No Periwound Skin Maceration: No N/A N/A Moisture: Dry/Scaly: No Periwound Skin Color: Erythema: Yes N/A N/A Atrophie Blanche: No Cyanosis: No Ecchymosis: No Hemosiderin Staining: No Mottled: No [name], [name] ([id number]) Pallor: No Rubor: No Erythema Location: Circumferential N/A N/A Temperature: No Abnormality N/A N/A Tenderness on Yes N/A N/A Palpation: Wound Preparation: Ulcer Cleansing: N/A N/A Rinsed/Irrigated with Saline Topical  Anesthetic Applied: Other: lidocaine 4% Procedures Performed: Debridement N/A N/A Treatment Notes Electronic Signature(s) Signed: 01/19/2017 5:04:14 PM By: Montey Hora Entered By: Montey Hora on 01/19/2017 08:52:24 [name] ([id number]) -------------------------------------------------------------------------------- [place] Details Patient Name: [name]. Date of Service: 01/19/2017 8:00 AM Medical Record Patient Account Number: [id number] Number: Treating RN: Montey Hora Date of Birth/Sex: 11/12/1935 (81 y.o. Female) Other Clinician: Primary Care Bianca Raneri: Lamonte Sakai Treating ROBSON, Paulette Blanch, Deano Tomaszewski/Extender: G Referring [place] Silveria: Harless Nakayama in Treatment: 0 Active Inactive ` Abuse / Safety / Falls / Self Care Management Nursing Diagnoses: Potential for falls Goals: Patient will remain injury free related to falls Date Initiated: 01/19/2017 Target Resolution Date: 03/05/2017 Goal Status: Active Interventions: Assess fall risk on admission and as needed Notes: ` Orientation to the Wound Care Program Nursing Diagnoses: Knowledge deficit related to the wound healing center program Goals: Patient/caregiver will verbalize understanding of the [facility] Program Date Initiated: 01/19/2017 Target Resolution Date: 03/05/2017 Goal Status: Active Interventions: Provide education on orientation to the wound center Notes: ` Pain, Acute or Chronic Nursing Diagnoses: [name], [name] ([id number]) Pain, acute or chronic: actual or potential Goals: Patient/caregiver will verbalize adequate pain control between visits Date Initiated: 01/19/2017 Target Resolution Date: 03/05/2017 Goal Status: Active Interventions: Assess comfort goal upon admission Notes: ` Wound/Skin Impairment Nursing Diagnoses: Impaired tissue integrity Goals: Ulcer/skin breakdown will heal within 14 weeks Date Initiated:  01/19/2017 Target Resolution Date: 04/02/2017 Goal Status: Active Interventions: Assess patient/caregiver ability to obtain necessary supplies Assess patient/caregiver ability to perform ulcer/skin care regimen upon admission and as needed Assess ulceration(s) every visit Notes: Electronic Signature(s) Signed: 01/19/2017 5:04:14 PM By: Montey Hora Entered By: Montey Hora on 01/19/2017 08:52:10 [name] ([id number]) -------------------------------------------------------------------------------- Pain Assessment Details Patient Name: [name]. Date of Service: 01/19/2017 8:00 AM Medical Record Patient Account Number: [id number] Number: Treating RN: Montey Hora Date of Birth/Sex: [birth year] (81 y.o. Female) Other Clinician: Primary Care Narek Kniss: Lamonte Sakai Treating Zonia Kief, Germaine Shenker/Extender: G Referring Makyra Corprew: Harless Nakayama in Treatment: 0 Active Problems Location of Pain Severity and Description of Pain Patient Has Paino Yes Site Locations Pain Location: Pain in Ulcers With Dressing Change: Yes Duration of the Pain. Constant / Intermittento Constant Pain Management and Medication Current Pain Management: Notes Topical or injectable lidocaine is offered to patient for acute pain when surgical debridement is performed. If needed, Patient is instructed to use over the counter pain medication for the following 24-48 hours after debridement. Wound care MDs do not prescribed pain medications. Patient has chronic pain or uncontrolled pain. Patient has been instructed to make an appointment with their Primary Care Physician for pain management. Electronic Signature(s) Signed: 01/19/2017 5:04:14 PM By: Montey Hora Entered By: Montey Hora on 01/19/2017 08:11:13 [name] ([id number]) -------------------------------------------------------------------------------- Patient/Caregiver Education Details Patient Name:  [name]. Date of Service: 01/19/2017 8:00 AM Medical Record Patient Account Number: [id number] Number: Treating RN: Montey Hora Date of Birth/Gender: 1935-09-30 (81  y.o. Female) Other Clinician: Primary Care Treating ROBSON, Vangie Bicker, Neelam Physician: Physician/Extender: Gery Pray, Weeks in Treatment: 0 Referring Physician: TRACY Education Assessment Education Provided To: Patient and Caregiver Education Topics Provided Wound/Skin Impairment: Handouts: Other: wound care as ordered Methods: Demonstration, Explain/Verbal Responses: State content correctly Electronic Signature(s) Signed: 01/19/2017 5:04:14 PM By: Montey Hora Entered By: Montey Hora on 01/19/2017 09:58:55 [name] ([id number]) -------------------------------------------------------------------------------- Wound Assessment Details Patient Name: [name]. Date of Service: 01/19/2017 8:00 AM Medical Record Patient Account Number: [id number] Number: Treating RN: Montey Hora Date of Birth/Sex: May 27, 1936 (81 y.o. Female) Other Clinician: Primary Care Rei Medlen: Lamonte Sakai Treating ROBSON, Paulette Blanch, Stephani Janak/Extender: G Referring Kriston Pasquarello: Harless Nakayama in Treatment: 0 Wound Status Wound Number: 1 Primary Trauma, Other Etiology: Wound Location: Left Lower Leg - Anterior Wound Status: Open Wounding Event: Trauma Comorbid Received Chemotherapy, Received Date Acquired: 01/07/2017 History: Radiation Weeks Of Treatment: 0 Clustered Wound: No Photos Photo Uploaded By: Montey Hora on 01/19/2017 11:16:08 Wound Measurements Length: (cm) 17.5 Width: (cm) 10.3 Depth: (cm) 1.2 Area: (cm) 141.568 Volume: (cm) 169.882 % Reduction in Area: % Reduction in Volume: Epithelialization: None Tunneling: No Undermining: No Wound Description Full Thickness With Exposed Classification: Support Structures Wound Margin: Indistinct,  nonvisible Exudate Large Amount: Exudate Type: Sanguinous Exudate Color: red Foul Odor After Cleansing: Yes Due to Product Use: No Slough/Fibrino Yes Wound Bed Granulation Amount: Small (1-33%) Exposed Structure [name], [name] ([id number]) Granulation Quality: Red, Hyper-granulation Fascia Exposed: No Necrotic Amount: Large (67-100%) Fat Layer (Subcutaneous Tissue) Exposed: Yes Necrotic Quality: Eschar, Adherent Slough Tendon Exposed: No Muscle Exposed: No Joint Exposed: No Bone Exposed: No Periwound Skin Texture Texture Color No Abnormalities Noted: No No Abnormalities Noted: No Callus: No Atrophie Blanche: No Crepitus: No Cyanosis: No Excoriation: No Ecchymosis: No Induration: No Erythema: Yes Rash: No Erythema Location: Circumferential Scarring: No Hemosiderin Staining: No Mottled: No Moisture Pallor: No No Abnormalities Noted: No Rubor: No Dry / Scaly: No Maceration: No Temperature / Pain Temperature: No Abnormality Tenderness on Palpation: Yes Wound Preparation Ulcer Cleansing: Rinsed/Irrigated with Saline Topical Anesthetic Applied: Other: lidocaine 4%, Treatment Notes Wound #1 (Left, Anterior Lower Leg) 1. Cleansed with: Clean wound with Normal Saline 2. Anesthetic Topical Lidocaine 4% cream to wound bed prior to debridement 4. Dressing Applied: Aquacel Ag Other dressing (specify in notes) 5. Secondary Dressing Applied ABD Pad Kerlix/Conform 7. Secured with Tape Other (specify in notes) Notes xtrasorb, kerlix and coban wrap lightly Electronic Signature(s) Signed: 01/19/2017 5:04:14 PM By: Raelyn Number ([id number]) Entered By: Montey Hora on 01/19/2017 08:31:33 [name] ([id number]) -------------------------------------------------------------------------------- Vitals Details Patient Name: [name]. Date of Service: 01/19/2017 8:00 AM Medical Record Patient Account Number:  [id number] Number: Treating RN: Montey Hora Date of Birth/Sex: 02/16/1936 (81 y.o. Female) Other Clinician: Primary Care Marionette [place]: Lamonte Sakai Treating ROBSON, MICHAEL MCLEAN-SCOCOZZA, Ashrita Chrismer/Extender: G Referring Charley Lafrance: Harless Nakayama in Treatment: 0 Vital Signs Time Taken: 08:14 Temperature (F): 97.6 Height (in): 68 Pulse (bpm): 67 Source: Measured Respiratory Rate (breaths/min): 16 Weight (lbs): 173 Blood Pressure (mmHg): 112/63 Source: Measured Reference Range: 80 - 120 mg / dl Body Mass Index (BMI): 26.3 Electronic Signature(s) Signed: 01/19/2017 5:04:14 PM By: Montey Hora Entered By: Montey Hora on 01/19/2017 08:21:18

## 2017-01-22 LAB — AEROBIC CULTURE  (SUPERFICIAL SPECIMEN)

## 2017-01-26 ENCOUNTER — Encounter: Payer: Medicare Other | Admitting: Internal Medicine

## 2017-01-26 DIAGNOSIS — I87322 Chronic venous hypertension (idiopathic) with inflammation of left lower extremity: Secondary | ICD-10-CM | POA: Diagnosis not present
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
## 2017-01-28 NOTE — Progress Notes (Signed)
EMYLIA, LATELLA ([id number]) Visit Report for 01/26/2017 Arrival Information Details Patient Name: [name], [name]. Date of Service: 01/26/2017 11:00 AM Medical Record Patient Account Number: [id number] Number: Treating RN: Russie, Gulledge 1935/06/06 (81 y.o. Other Clinician: Date of Birth/Sex: Female) Treating ROBSON, MICHAEL Primary Care Lokelani Lutes: Lamonte Sakai Anandi Abramo/Extender: G Referring Venisa Frampton: Orland Mustard in Treatment: 1 Visit Information History Since Last Visit Added or deleted any medications: No Patient Arrived: Ambulatory Any new allergies or adverse reactions: No Arrival Time: 10:56 Had a fall or experienced change in No Accompanied By: self activities of daily living that may affect Transfer Assistance: None risk of falls: Patient Identification Verified: Yes Signs or symptoms of abuse/neglect since last No Secondary Verification Process Yes visito Completed: Hospitalized since last visit: No Has Dressing in Place as Prescribed: Yes Has Compression in Place as Prescribed: Yes Pain Present Now: Yes Electronic Signature(s) Signed: 01/26/2017 5:05:53 PM By: Montey Hora Entered By: Montey Hora on 01/26/2017 10:57:06 [name] ([id number]) -------------------------------------------------------------------------------- Encounter Discharge Information Details Patient Name: [name]. Date of Service: 01/26/2017 11:00 AM Medical Record Patient Account Number: [id number] Number: Treating RN: Maybree, Riling 02/13/1936 (81 y.o. Other Clinician: Date of Birth/Sex: Female) Treating ROBSON, MICHAEL Primary Care Krishiv Sandler: Lamonte Sakai Jaylia Pettus/Extender: G Referring Victorino Fatzinger: Orland Mustard in Treatment: 1 Encounter Discharge Information Items Discharge Pain Level: 0 Discharge Condition: Stable Ambulatory Status: Ambulatory Discharge Destination: Home Transportation: Private Auto Accompanied By: self Schedule  Follow-up Appointment: Yes Medication Reconciliation completed and provided to Patient/Care No Akashdeep Chuba: Provided on Clinical Summary of Care: 01/26/2017 Form Type Recipient Paper Patient JG Electronic Signature(s) Signed: 01/26/2017 2:28:24 PM By: Montey Hora Entered By: Montey Hora on 01/26/2017 14:28:24 [name] ([id number]) -------------------------------------------------------------------------------- Lower Extremity Assessment Details Patient Name: [name]. Date of Service: 01/26/2017 11:00 AM Medical Record Patient Account Number: [id number] Number: Treating RN: Natalija, Mavis November 22, 1935 (81 y.o. Other Clinician: Date of Birth/Sex: Female) Treating ROBSON, MICHAEL Primary Care Nahomy Limburg: Lamonte Sakai Adama Ferber/Extender: G Referring Vittoria Noreen: Orland Mustard in Treatment: 1 Vascular Assessment Pulses: Posterior Tibial Extremity colors, hair growth, and conditions: Extremity Color: [Left:Hyperpigmented] Hair Growth on Extremity: [Left:Yes] Temperature of Extremity: [Left:Warm] Capillary Refill: [Left:< 3 seconds] Electronic Signature(s) Signed: 01/26/2017 5:05:53 PM By: Montey Hora Entered By: Montey Hora on 01/26/2017 11:12:11 [name] ([id number]) -------------------------------------------------------------------------------- Multi Wound Chart Details Patient Name: [name]. Date of Service: 01/26/2017 11:00 AM Medical Record Patient Account Number: [id number] Number: Treating RN: Azaria, Stegman 1935-12-06 (81 y.o. Other Clinician: Date of Birth/Sex: Female) Treating ROBSON, MICHAEL Primary Care Chayse Gracey: Lamonte Sakai Elisha Mcgruder/Extender: G Referring Helane Briceno: Orland Mustard in Treatment: 1 Vital Signs Height(in): 68 Pulse(bpm): 66 Weight(lbs): 169 Blood Pressure 135/60 (mmHg): Body Mass Index(BMI): 26 Temperature(F): 98.1 Respiratory Rate 18 (breaths/min): Photos: [1:No Photos]  [N/A:N/A] Wound Location: [1:Left Lower Leg - Anterior] [N/A:N/A] Wounding Event: [1:Trauma] [N/A:N/A] Primary Etiology: [1:Trauma, Other] [N/A:N/A] Comorbid History: [1:Received Chemotherapy, Received Radiation] [N/A:N/A] Date Acquired: [1:01/07/2017] [N/A:N/A] Weeks of Treatment: [1:1] [N/A:N/A] Wound Status: [1:Open] [N/A:N/A] Measurements L x W x Hopkins 17.7x8.2x1 [N/A:N/A] (cm) Area (cm) : [1:113.993] [N/A:N/A] Volume (cm) : [1:113.993] [N/A:N/A] % Reduction in Area: [1:19.50%] [N/A:N/A] % Reduction in Volume: 32.90% [N/A:N/A] Classification: [1:Full Thickness With Exposed Support Structures] [N/A:N/A] Exudate Amount: [1:Large] [N/A:N/A] Exudate Type: [1:Sanguinous] [N/A:N/A] Exudate Color: [1:red] [N/A:N/A] Foul Odor After [1:Yes] [N/A:N/A] Cleansing: Odor Anticipated Due to No [N/A:N/A] Product Use: Wound Margin: [1:Indistinct, nonvisible] [N/A:N/A] Granulation Amount: [1:Small (1-33%)] [N/A:N/A] Granulation Quality: [1:Red, Hyper-granulation] [N/A:N/A] Necrotic Amount:  Large (67-100%) N/A N/A Necrotic Tissue: Eschar, Adherent Slough N/A N/A Exposed Structures: Fat Layer (Subcutaneous N/A N/A Tissue) Exposed: Yes Fascia: No Tendon: No Muscle: No Joint: No Bone: No Epithelialization: None N/A N/A Debridement: Debridement (62229- N/A N/A 11047) Pre-procedure 11:19 N/A N/A Verification/Time Out Taken: Pain Control: Lidocaine 4% Topical N/A N/A Solution Tissue Debrided: Necrotic/Eschar, N/A N/A Fibrin/Slough, Subcutaneous Level: Skin/Subcutaneous N/A N/A Tissue Debridement Area (sq 145.14 N/A N/A cm): Instrument: Curette N/A N/A Bleeding: Moderate N/A N/A Hemostasis Achieved: Pressure N/A N/A Procedural Pain: 0 N/A N/A Post Procedural Pain: 0 N/A N/A Debridement Treatment Procedure was tolerated N/A N/A Response: well Post Debridement 17.7x8.2x1.2 N/A N/A Measurements L x W x Hopkins (cm) Post Debridement 136.791 N/A N/A Volume: (cm) Periwound Skin Texture:  Excoriation: No N/A N/A Induration: No Callus: No Crepitus: No Rash: No Scarring: No Periwound Skin Maceration: No N/A N/A Moisture: Dry/Scaly: No Periwound Skin Color: Erythema: Yes N/A N/A Atrophie Blanche: No Cyanosis: No Ecchymosis: No Hemosiderin Staining: No Mottled: No [name], [name] ([id number]) Pallor: No Rubor: No Erythema Location: Circumferential N/A N/A Temperature: No Abnormality N/A N/A Tenderness on Yes N/A N/A Palpation: Wound Preparation: Ulcer Cleansing: N/A N/A Rinsed/Irrigated with Saline Topical Anesthetic Applied: Other: lidocaine 4% Procedures Performed: Debridement N/A N/A Treatment Notes Electronic Signature(s) Signed: 01/27/2017 7:49:24 AM By: Linton Ham MD Entered By: Linton Ham on 01/26/2017 11:28:47 [name] ([id number]) -------------------------------------------------------------------------------- Bowler Details Patient Name: [name]. Date of Service: 01/26/2017 11:00 AM Medical Record Patient Account Number: [id number] Number: Treating RN: Mathilde, Mcwherter 1936-04-10 (81 y.o. Other Clinician: Date of Birth/Sex: Female) Treating ROBSON, MICHAEL Primary Care Wynston Romey: Lamonte Sakai Tommie Dejoseph/Extender: G Referring Akeem Heppler: Orland Mustard in Treatment: 1 Active Inactive ` Abuse / Safety / Falls / Self Care Management Nursing Diagnoses: Potential for falls Goals: Patient will remain injury free related to falls Date Initiated: 01/19/2017 Target Resolution Date: 03/05/2017 Goal Status: Active Interventions: Assess fall risk on admission and as needed Notes: ` Orientation to the Wound Care Program Nursing Diagnoses: Knowledge deficit related to the wound healing center program Goals: Patient/caregiver will verbalize understanding of the [facility] Date Initiated: 01/19/2017 Target Resolution Date: 03/05/2017 Goal Status: Active Interventions: Provide  education on orientation to the wound center Notes: ` Pain, Acute or Chronic Nursing Diagnoses: [name], [name] ([id number]) Pain, acute or chronic: actual or potential Goals: Patient/caregiver will verbalize adequate pain control between visits Date Initiated: 01/19/2017 Target Resolution Date: 03/05/2017 Goal Status: Active Interventions: Assess comfort goal upon admission Notes: ` Wound/Skin Impairment Nursing Diagnoses: Impaired tissue integrity Goals: Ulcer/skin breakdown will heal within 14 weeks Date Initiated: 01/19/2017 Target Resolution Date: 04/02/2017 Goal Status: Active Interventions: Assess patient/caregiver ability to obtain necessary supplies Assess patient/caregiver ability to perform ulcer/skin care regimen upon admission and as needed Assess ulceration(s) every visit Notes: Electronic Signature(s) Signed: 01/26/2017 5:05:53 PM By: Montey Hora Entered By: Montey Hora on 01/26/2017 11:12:17 [name] ([id number]) -------------------------------------------------------------------------------- Pain Assessment Details Patient Name: [name]. Date of Service: 01/26/2017 11:00 AM Medical Record Patient Account Number: [id number] Number: Treating RN: Anaissa, Macfadden [birth year] (81 y.o. Other Clinician: Date of Birth/Sex: Female) Treating ROBSON, MICHAEL Primary Care Zack Crager: Lamonte Sakai Orene Abbasi/Extender: G Referring Kadeshia Kasparian: Orland Mustard in Treatment: 1 Active Problems Location of Pain Severity and Description of Pain Patient Has Paino Yes Site Locations Pain Location: Pain in Ulcers With Dressing Change: Yes Duration of the Pain. Constant / Intermittento Constant Pain Management and Medication Current Pain  Management: Notes Topical or injectable lidocaine is offered to patient for acute pain when surgical debridement is performed. If needed, Patient is instructed to use over the counter pain medication for the  following 24-48 hours after debridement. Wound care MDs do not prescribed pain medications. Patient has chronic pain or uncontrolled pain. Patient has been instructed to make an appointment with their Primary Care Physician for pain management. Electronic Signature(s) Signed: 01/26/2017 5:05:53 PM By: Montey Hora Entered By: Montey Hora on 01/26/2017 10:57:25 [name] ([id number]) -------------------------------------------------------------------------------- Patient/Caregiver Education Details Patient Name: [name]. Date of Service: 01/26/2017 11:00 AM Medical Record Patient Account Number: [id number] Number: Treating RN: Lynnae, Ludemann 1935/12/06 (81 y.o. Other Clinician: Date of Birth/Gender: Female) Treating ROBSON, [place] Primary Care Physician: Lamonte Sakai Physician/Extender: G Referring Physician: Orland Mustard in Treatment: 1 Education Assessment Education Provided To: Patient Education Topics Provided Wound/Skin Impairment: Handouts: Other: wound care as ordered Methods: Demonstration, Explain/Verbal Responses: State content correctly Electronic Signature(s) Signed: 01/26/2017 5:05:53 PM By: Montey Hora Entered By: Montey Hora on 01/26/2017 14:28:40 [name] ([id number]) -------------------------------------------------------------------------------- Wound Assessment Details Patient Name: [name]. Date of Service: 01/26/2017 11:00 AM Medical Record Patient Account Number: [id number] Number: Treating RN: Dawnell, Bryant [birth year] (81 y.o. Other Clinician: Date of Birth/Sex: Female) Treating ROBSON, MICHAEL Primary Care Jazzalyn Loewenstein: Lamonte Sakai Takerra Lupinacci/Extender: G Referring Seraphim Trow: Orland Mustard in Treatment: 1 Wound Status Wound Number: 1 Primary Trauma, Other Etiology: Wound Location: Left Lower Leg - Anterior Wound Status: Open Wounding Event: Trauma Comorbid Received Chemotherapy,  Received Date Acquired: 01/07/2017 History: Radiation Weeks Of Treatment: 1 Clustered Wound: No Photos Photo Uploaded By: Montey Hora on 01/26/2017 12:46:11 Wound Measurements Length: (cm) 17.7 Width: (cm) 8.2 Depth: (cm) 1 Area: (cm) 113.993 Volume: (cm) 113.993 % Reduction in Area: 19.5% % Reduction in Volume: 32.9% Epithelialization: None Tunneling: No Undermining: No Wound Description Full Thickness With Exposed Classification: Support Structures Wound Margin: Indistinct, nonvisible Exudate Large Amount: Exudate Type: Sanguinous Exudate Color: red Foul Odor After Cleansing: Yes Due to Product Use: No Slough/Fibrino Yes Wound Bed Granulation Amount: Small (1-33%) Exposed Structure [name], [name] ([id number]) Granulation Quality: Red, Hyper-granulation Fascia Exposed: No Necrotic Amount: Large (67-100%) Fat Layer (Subcutaneous Tissue) Exposed: Yes Necrotic Quality: Eschar, Adherent Slough Tendon Exposed: No Muscle Exposed: No Joint Exposed: No Bone Exposed: No Periwound Skin Texture Texture Color No Abnormalities Noted: No No Abnormalities Noted: No Callus: No Atrophie Blanche: No Crepitus: No Cyanosis: No Excoriation: No Ecchymosis: No Induration: No Erythema: Yes Rash: No Erythema Location: Circumferential Scarring: No Hemosiderin Staining: No Mottled: No Moisture Pallor: No No Abnormalities Noted: No Rubor: No Dry / Scaly: No Maceration: No Temperature / Pain Temperature: No Abnormality Tenderness on Palpation: Yes Wound Preparation Ulcer Cleansing: Rinsed/Irrigated with Saline Topical Anesthetic Applied: Other: lidocaine 4%, Treatment Notes Wound #1 (Left, Anterior Lower Leg) 1. Cleansed with: Clean wound with Normal Saline 2. Anesthetic Topical Lidocaine 4% cream to wound bed prior to debridement 4. Dressing Applied: Aquacel Ag Contact layer Other dressing (specify in notes) 5. Secondary Dressing Applied ABD Pad 7.  Secured with Tape Other (specify in notes) Notes xtrasorb, kerlix and coban wrap lightly Electronic Signature(s) Signed: 01/26/2017 5:05:53 PM By: Raelyn Number ([id number]) Entered By: Montey Hora on 01/26/2017 11:11:55 [name] ([id number]) -------------------------------------------------------------------------------- Vitals Details Patient Name: [name]. Date of Service: 01/26/2017 11:00 AM Medical Record Patient Account Number: [id number] Number: Treating RN: Reizel, Calzada 11-21-1935 (81 y.o. Other Clinician: Date  of Birth/Sex: Female) Treating ROBSON, Gorman Primary Care Lavern Crimi: Lamonte Sakai Kaleigh Spiegelman/Extender: G Referring Jacki Couse: Orland Mustard in Treatment: 1 Vital Signs Time Taken: 10:57 Temperature (F): 98.1 Height (in): 68 Pulse (bpm): 66 Source: Measured Respiratory Rate (breaths/min): 18 Weight (lbs): 169 Blood Pressure (mmHg): 135/60 Source: Measured Reference Range: 80 - 120 mg / dl Body Mass Index (BMI): 25.7 Electronic Signature(s) Signed: 01/26/2017 5:05:53 PM By: Montey Hora Entered By: Montey Hora on 01/26/2017 10:59:24

## 2017-02-02 ENCOUNTER — Encounter: Payer: Medicare Other | Attending: Internal Medicine | Admitting: Internal Medicine

## 2017-02-02 DIAGNOSIS — J449 Chronic obstructive pulmonary disease, unspecified: Secondary | ICD-10-CM | POA: Insufficient documentation

## 2017-02-02 DIAGNOSIS — X58XXXD Exposure to other specified factors, subsequent encounter: Secondary | ICD-10-CM | POA: Insufficient documentation

## 2017-02-02 DIAGNOSIS — Z853 Personal history of malignant neoplasm of breast: Secondary | ICD-10-CM | POA: Diagnosis not present

## 2017-02-02 DIAGNOSIS — F17200 Nicotine dependence, unspecified, uncomplicated: Secondary | ICD-10-CM | POA: Diagnosis not present

## 2017-02-02 DIAGNOSIS — I87322 Chronic venous hypertension (idiopathic) with inflammation of left lower extremity: Secondary | ICD-10-CM | POA: Diagnosis not present

## 2017-02-02 DIAGNOSIS — Z9221 Personal history of antineoplastic chemotherapy: Secondary | ICD-10-CM | POA: Diagnosis not present

## 2017-02-02 DIAGNOSIS — K7460 Unspecified cirrhosis of liver: Secondary | ICD-10-CM | POA: Insufficient documentation

## 2017-02-02 DIAGNOSIS — S81812D Laceration without foreign body, left lower leg, subsequent encounter: Secondary | ICD-10-CM | POA: Diagnosis not present

## 2017-02-02 DIAGNOSIS — Z923 Personal history of irradiation: Secondary | ICD-10-CM | POA: Diagnosis not present

## 2017-02-02 DIAGNOSIS — G629 Polyneuropathy, unspecified: Secondary | ICD-10-CM | POA: Diagnosis not present
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
## 2017-02-04 NOTE — Progress Notes (Signed)
MYRTH, DAHAN ([id number]) Visit Report for 02/02/2017 Arrival Information Details Patient Name: [name], [name]. Date of Service: 02/02/2017 12:30 PM Medical Record Patient Account Number: [id number] Number: Treating RN: Cornell Barman 07/20/1935 (81 y.o. Other Clinician: Date of Birth/Sex: Female) Treating ROBSON, MICHAEL Primary Care Jaylyn Booher: Lamonte Sakai Nicodemus Denk/Extender: G Referring Ilean Spradlin: Orland Mustard in Treatment: 2 Visit Information History Since Last Visit Added or deleted any medications: No Patient Arrived: Ambulatory Any new allergies or adverse reactions: No Arrival Time: 12:39 Had a fall or experienced change in No Accompanied By: swelf activities of daily living that may affect Transfer Assistance: None risk of falls: Patient Identification Verified: Yes Signs or symptoms of abuse/neglect since last No Secondary Verification Process Yes visito Completed: Hospitalized since last visit: No Has Dressing in Place as Prescribed: No Pain Present Now: No Electronic Signature(s) Signed: 02/02/2017 5:20:16 PM By: Gretta Cool, BSN, RN, CWS, Kim RN, BSN Entered By: Gretta Cool, BSN, RN, CWS, Kim on 02/02/2017 12:40:16 [name] ([id number]) -------------------------------------------------------------------------------- Clinic Level of Care Assessment Details Patient Name: [name]. Date of Service: 02/02/2017 12:30 PM Medical Record Patient Account Number: [id number] Number: Treating RN: Cornell Barman Jun 22, 1935 (81 y.o. Other Clinician: Date of Birth/Sex: Female) Treating ROBSON, Ashley Primary Care Deakin Lacek: Lamonte Sakai Lyrick Worland/Extender: G Referring Suzzane Quilter: Orland Mustard in Treatment: 2 Clinic Level of Care Assessment Items TOOL 4 Quantity Score []  - Use when only an EandM is performed on FOLLOW-UP visit 0 ASSESSMENTS - Nursing Assessment / Reassessment []  - Reassessment of Co-morbidities (includes updates in patient status)  0 X - Reassessment of Adherence to Treatment Plan 1 5 ASSESSMENTS - Wound and Skin Assessment / Reassessment X - Simple Wound Assessment / Reassessment - one wound 1 5 []  - Complex Wound Assessment / Reassessment - multiple wounds 0 []  - Dermatologic / Skin Assessment (not related to wound area) 0 ASSESSMENTS - Focused Assessment []  - Circumferential Edema Measurements - multi extremities 0 []  - Nutritional Assessment / Counseling / Intervention 0 []  - Lower Extremity Assessment (monofilament, tuning fork, pulses) 0 []  - Peripheral Arterial Disease Assessment (using hand held doppler) 0 ASSESSMENTS - Ostomy and/or Continence Assessment and Care []  - Incontinence Assessment and Management 0 []  - Ostomy Care Assessment and Management (repouching, etc.) 0 PROCESS - Coordination of Care X - Simple Patient / Family Education for ongoing care 1 15 []  - Complex (extensive) Patient / Family Education for ongoing care 0 X - Staff obtains Programmer, systems, Records, Test Results / Process Orders 1 10 []  - Staff telephones HHA, Nursing Homes / Clarify orders / etc 0 [name], [name] ([id number]) []  - Routine Transfer to another Facility (non-emergent condition) 0 []  - Routine Hospital Admission (non-emergent condition) 0 []  - New Admissions / Biomedical engineer / Ordering NPWT, Apligraf, etc. 0 []  - Emergency Hospital Admission (emergent condition) 0 X - Simple Discharge Coordination 1 10 []  - Complex (extensive) Discharge Coordination 0 PROCESS - Special Needs []  - Pediatric / Minor Patient Management 0 []  - Isolation Patient Management 0 []  - Hearing / Language / Visual special needs 0 []  - Assessment of Community assistance (transportation, D/C planning, etc.) 0 []  - Additional assistance / Altered mentation 0 []  - Support Surface(s) Assessment (bed, cushion, seat, etc.) 0 INTERVENTIONS - Wound Cleansing / Measurement X - Simple Wound Cleansing - one wound 1 5 []  - Complex Wound Cleansing -  multiple wounds 0 X - Wound Imaging (photographs - any number of wounds) 1 5 []  -  Wound Tracing (instead of photographs) 0 X - Simple Wound Measurement - one wound 1 5 []  - Complex Wound Measurement - multiple wounds 0 INTERVENTIONS - Wound Dressings []  - Small Wound Dressing one or multiple wounds 0 X - Medium Wound Dressing one or multiple wounds 1 15 []  - Large Wound Dressing one or multiple wounds 0 []  - Application of Medications - topical 0 []  - Application of Medications - injection 0 [name], [name] ([id number]) INTERVENTIONS - Miscellaneous []  - External ear exam 0 []  - Specimen Collection (cultures, biopsies, blood, body fluids, etc.) 0 []  - Specimen(s) / Culture(s) sent or taken to Lab for analysis 0 []  - Patient Transfer (multiple staff / Civil Service fast streamer / Similar devices) 0 []  - Simple Staple / Suture removal (25 or less) 0 []  - Complex Staple / Suture removal (26 or more) 0 []  - Hypo / Hyperglycemic Management (close monitor of Blood Glucose) 0 []  - Ankle / Brachial Index (ABI) - do not check if billed separately 0 X - Vital Signs 1 5 Has the patient been seen at the hospital within the last three years: Yes Total Score: 80 Level Of Care: New/Established - Level 3 Electronic Signature(s) Signed: 02/02/2017 5:20:16 PM By: Gretta Cool, BSN, RN, CWS, Kim RN, BSN Entered By: Gretta Cool, BSN, RN, CWS, Kim on 02/02/2017 13:01:47 [name] ([id number]) -------------------------------------------------------------------------------- Encounter Discharge Information Details Patient Name: [name]. Date of Service: 02/02/2017 12:30 PM Medical Record Patient Account Number: [id number] Number: Treating RN: Cornell Barman 07-21-1935 (81 y.o. Other Clinician: Date of Birth/Sex: Female) Treating ROBSON, MICHAEL Primary Care Firmin Belisle: Lamonte Sakai Thi Sisemore/Extender: G Referring Talyn Dessert: Orland Mustard in Treatment: 2 Encounter Discharge Information Items Discharge Pain  Level: 0 Discharge Condition: Stable Ambulatory Status: Ambulatory Discharge Destination: Home Transportation: Private Auto Accompanied By: self Schedule Follow-up Appointment: Yes Medication Reconciliation completed and provided to Patient/Care Yes Charley Miske: Provided on Clinical Summary of Care: 02/02/2017 Form Type Recipient Paper Patient JG Electronic Signature(s) Signed: 02/03/2017 10:12:36 AM By: Ruthine Dose Previous Signature: 02/02/2017 5:20:16 PM Version By: Gretta Cool, BSN, RN, CWS, Kim RN, BSN Entered By: Ruthine Dose on 02/03/2017 09:55:04 [name] ([id number]) -------------------------------------------------------------------------------- Lower Extremity Assessment Details Patient Name: [name]. Date of Service: 02/02/2017 12:30 PM Medical Record Patient Account Number: [id number] Number: Treating RN: Cornell Barman 27-Sep-1935 (81 y.o. Other Clinician: Date of Birth/Sex: Female) Treating ROBSON, MICHAEL Primary Care Krystopher Kuenzel: Lamonte Sakai Conner Muegge/Extender: G Referring Tylan Briguglio: Orland Mustard in Treatment: 2 Vascular Assessment Pulses: Dorsalis Pedis Doppler Audible: [Left:Yes] Posterior Tibial Extremity colors, hair growth, and conditions: Extremity Color: [Left:Hyperpigmented] Hair Growth on Extremity: [Left:Yes] Temperature of Extremity: [Left:Warm] Capillary Refill: [Left:< 3 seconds] Toe Nail Assessment Left: Right: Thick: No Discolored: No Deformed: No Improper Length and Hygiene: No Electronic Signature(s) Signed: 02/02/2017 5:20:16 PM By: Gretta Cool, BSN, RN, CWS, Kim RN, BSN Entered By: Gretta Cool, BSN, RN, CWS, Kim on 02/02/2017 12:53:26 [name] ([id number]) -------------------------------------------------------------------------------- Multi Wound Chart Details Patient Name: [name]. Date of Service: 02/02/2017 12:30 PM Medical Record Patient Account Number: [id number] Number: Treating RN: Cornell Barman Dec 28, 1935 (81 y.o. Other Clinician: Date of Birth/Sex: Female) Treating ROBSON, MICHAEL Primary Care Bertin Inabinet: Lamonte Sakai Taria Castrillo/Extender: G Referring Ramondo Dietze: Orland Mustard in Treatment: 2 Vital Signs Height(in): 68 Pulse(bpm): 70 Weight(lbs): 169 Blood Pressure 136/63 (mmHg): Body Mass Index(BMI): 26 Temperature(F): 97.9 Respiratory Rate 18 (breaths/min): Photos: [N/A:N/A] Wound Location: Left Lower Leg - Anterior N/A N/A Wounding Event: Trauma N/A N/A Primary Etiology:  Trauma, Other N/A N/A Comorbid History: Received Chemotherapy, N/A N/A Received Radiation Date Acquired: 01/07/2017 N/A N/A Weeks of Treatment: 2 N/A N/A Wound Status: Open N/A N/A Measurements L x W x D 11x5.5x0.1 N/A N/A (cm) Area (cm) : 47.517 N/A N/A Volume (cm) : 4.752 N/A N/A % Reduction in Area: 66.40% N/A N/A % Reduction in Volume: 97.20% N/A N/A Classification: Full Thickness Without N/A N/A Exposed Support Structures Exudate Amount: Large N/A N/A Exudate Type: Sanguinous N/A N/A Exudate Color: red N/A N/A Foul Odor After Yes N/A N/A CleansingSRINIDHI, [name] ([id number]) Odor Anticipated Due to No N/A N/A Product Use: Wound Margin: Indistinct, nonvisible N/A N/A Granulation Amount: Small (1-33%) N/A N/A Granulation Quality: Red N/A N/A Necrotic Amount: Small (1-33%) N/A N/A Necrotic Tissue: Eschar N/A N/A Exposed Structures: Fat Layer (Subcutaneous N/A N/A Tissue) Exposed: Yes Fascia: No Tendon: No Muscle: No Joint: No Bone: No Epithelialization: Small (1-33%) N/A N/A Periwound Skin Texture: Excoriation: Yes N/A N/A Induration: No Callus: No Crepitus: No Rash: No Scarring: No Periwound Skin Maceration: No N/A N/A Moisture: Dry/Scaly: No Periwound Skin Color: Ecchymosis: Yes N/A N/A Atrophie Blanche: No Cyanosis: No Erythema: No Hemosiderin Staining: No Mottled: No Pallor: No Rubor: No Temperature: No Abnormality N/A N/A Tenderness on Yes N/A  N/A Palpation: Wound Preparation: Ulcer Cleansing: N/A N/A Rinsed/Irrigated with Saline Topical Anesthetic Applied: None Treatment Notes Electronic Signature(s) Signed: 02/03/2017 4:30:21 AM By: Linton Ham MD Entered By: Linton Ham on 02/02/2017 12:59:28 [name] ([id number]) -------------------------------------------------------------------------------- Multi-Disciplinary Care Plan Details Patient Name: [name]. Date of Service: 02/02/2017 12:30 PM Medical Record Patient Account Number: [id number] Number: Treating RN: Cornell Barman December 11, 1935 (81 y.o. Other Clinician: Date of Birth/Sex: Female) Treating ROBSON, MICHAEL Primary Care Tava Peery: Lamonte Sakai Dustan Hyams/Extender: G Referring Khloie Hamada: Orland Mustard in Treatment: 2 Active Inactive ` Abuse / Safety / Falls / Self Care Management Nursing Diagnoses: Potential for falls Goals: Patient will remain injury free related to falls Date Initiated: 01/19/2017 Target Resolution Date: 03/05/2017 Goal Status: Active Interventions: Assess fall risk on admission and as needed Notes: ` Orientation to the Wound Care Program Nursing Diagnoses: Knowledge deficit related to the wound healing center program Goals: Patient/caregiver will verbalize understanding of the [place] Program Date Initiated: 01/19/2017 Target Resolution Date: 03/05/2017 Goal Status: Active Interventions: Provide education on orientation to the wound center Notes: ` Pain, Acute or Chronic Nursing Diagnoses: [name], [name] ([id number]) Pain, acute or chronic: actual or potential Goals: Patient/caregiver will verbalize adequate pain control between visits Date Initiated: 01/19/2017 Target Resolution Date: 03/05/2017 Goal Status: Active Interventions: Assess comfort goal upon admission Notes: ` Wound/Skin Impairment Nursing Diagnoses: Impaired tissue integrity Goals: Ulcer/skin breakdown will heal  within 14 weeks Date Initiated: 01/19/2017 Target Resolution Date: 04/02/2017 Goal Status: Active Interventions: Assess patient/caregiver ability to obtain necessary supplies Assess patient/caregiver ability to perform ulcer/skin care regimen upon admission and as needed Assess ulceration(s) every visit Notes: Electronic Signature(s) Signed: 02/02/2017 5:20:16 PM By: Gretta Cool, BSN, RN, CWS, Kim RN, BSN Entered By: Gretta Cool, BSN, RN, CWS, Kim on 02/02/2017 12:53:52 [name] ([id number]) -------------------------------------------------------------------------------- Pain Assessment Details Patient Name: [name]. Date of Service: 02/02/2017 12:30 PM Medical Record Patient Account Number: [id number] Number: Treating RN: Cornell Barman [birth year] (81 y.o. Other Clinician: Date of Birth/Sex: Female) Treating ROBSON, MICHAEL Primary Care Bernedette Auston: Lamonte Sakai Chenelle Benning/Extender: G Referring Venus Gilles: Orland Mustard in Treatment: 2 Active Problems Location of Pain Severity and Description of Pain Patient Has Paino  No Site Locations With Dressing Change: No Pain Management and Medication Current Pain Management: Goals for Pain Management Topical or injectable lidocaine is offered to patient for acute pain when surgical debridement is performed. If needed, Patient is instructed to use over the counter pain medication for the following 24-48 hours after debridement. Wound care MDs do not prescribed pain medications. Patient has chronic pain or uncontrolled pain. Patient has been instructed to make an appointment with their Primary Care Physician for pain management. Electronic Signature(s) Signed: 02/02/2017 5:20:16 PM By: Gretta Cool, BSN, RN, CWS, Kim RN, BSN Entered By: Gretta Cool, BSN, RN, CWS, Kim on 02/02/2017 12:43:07 [name] ([id number]) -------------------------------------------------------------------------------- Patient/Caregiver Education Details Patient Name:  [name]. Date of Service: 02/02/2017 12:30 PM Medical Record Patient Account Number: [id number] Number: Treating RN: Cornell Barman [birth year] (81 y.o. Other Clinician: Date of Birth/Gender: Female) Treating ROBSON, [place] Primary Care Physician: Lamonte Sakai Physician/Extender: G Referring Physician: Orland Mustard in Treatment: 2 Education Assessment Education Provided To: Patient Education Topics Provided Welcome To [facility]: Handouts: Welcome To [facility] [place] Methods: Demonstration Responses: State content correctly Wound/Skin Impairment: Handouts: Caring for Your Ulcer Methods: Demonstration Responses: State content correctly Electronic Signature(s) Signed: 02/02/2017 5:20:16 PM By: Gretta Cool, BSN, RN, CWS, Kim RN, BSN Entered By: Gretta Cool, BSN, RN, CWS, Kim on 02/02/2017 16:28:36 [name] ([id number]) -------------------------------------------------------------------------------- Wound Assessment Details Patient Name: [name]. Date of Service: 02/02/2017 12:30 PM Medical Record Patient Account Number: [id number] Number: Treating RN: Cornell Barman [birth year] (81 y.o. Other Clinician: Date of Birth/Sex: Female) Treating ROBSON, MICHAEL Primary Care Chade Pitner: Lamonte Sakai Elease Swarm/Extender: G Referring Aastha Dayley: Orland Mustard in Treatment: 2 Wound Status Wound Number: 1 Primary Trauma, Other Etiology: Wound Location: Left Lower Leg - Anterior Wound Status: Open Wounding Event: Trauma Comorbid Received Chemotherapy, Received Date Acquired: 01/07/2017 History: Radiation Weeks Of Treatment: 2 Clustered Wound: No Photos Wound Measurements Length: (cm) 11 Width: (cm) 5.5 Depth: (cm) 0.1 Area: (cm) 47.517 Volume: (cm) 4.752 % Reduction in Area: 66.4% % Reduction in Volume: 97.2% Epithelialization: Small (1-33%) Tunneling: No Undermining: No Wound Description Full Thickness Without Exposed Foul Odor  After C Classification: Support Structures Due to Product [place] Wound Margin: Indistinct, nonvisible Slough/Fibrino Exudate Large Amount: Exudate Type: Sanguinous Exudate Color: red leansing: Yes e: No Yes Wound Bed Granulation Amount: Small (1-33%) Exposed Structure Granulation Quality: Red Fascia Exposed: No Necrotic Amount: Small (1-33%) Fat Layer (Subcutaneous Tissue) Exposed: Yes Necrotic Quality: Eschar Tendon Exposed: No NARYA, [name] ([id number]) Muscle Exposed: No Joint Exposed: No Bone Exposed: No Periwound Skin Texture Texture Color No Abnormalities Noted: No No Abnormalities Noted: No Callus: No Atrophie Blanche: No Crepitus: No Cyanosis: No Excoriation: Yes Ecchymosis: Yes Induration: No Erythema: No Rash: No Hemosiderin Staining: No Scarring: No Mottled: No Pallor: No Moisture Rubor: No No Abnormalities Noted: No Dry / Scaly: No Temperature / Pain Maceration: No Temperature: No Abnormality Tenderness on Palpation: Yes Wound Preparation Ulcer Cleansing: Rinsed/Irrigated with Saline Topical Anesthetic Applied: None Treatment Notes Wound #1 (Left, Anterior Lower Leg) 1. Cleansed with: Clean wound with Normal Saline 4. Dressing Applied: Aquacel Ag Contact layer 5. Secondary Dressing Applied ABD and Kerlix/Conform Electronic Signature(s) Signed: 02/02/2017 5:20:16 PM By: Gretta Cool, BSN, RN, CWS, Kim RN, BSN Entered By: Gretta Cool, BSN, RN, CWS, Kim on 02/02/2017 12:47:17 [name] ([id number]) -------------------------------------------------------------------------------- [place] Details Patient Name: [name]. Date of Service: 02/02/2017 12:30 PM Medical Record Patient Account Number: [id number] Number: Treating RN:  Cornell Barman 14-May-1936 (81 y.o. Other Clinician: Date of Birth/Sex: Female) Treating ROBSON, MICHAEL Primary Care Eshawn Coor: Lamonte Sakai Rodnesha Elie/Extender: G Referring Yazmina Pareja: Orland Mustard in Treatment:  2 Vital Signs Time Taken: 12:43 Temperature (F): 97.9 Height (in): 68 Pulse (bpm): 70 Weight (lbs): 169 Respiratory Rate (breaths/min): 18 Body Mass Index (BMI): 25.7 Blood Pressure (mmHg): 136/63 Reference Range: 80 - 120 mg / dl Electronic Signature(s) Signed: 02/02/2017 5:20:16 PM By: Gretta Cool, BSN, RN, CWS, Kim RN, BSN Entered By: Gretta Cool, BSN, RN, CWS, Kim on 02/02/2017 12:43:50

## 2017-02-09 ENCOUNTER — Encounter: Payer: Medicare Other | Admitting: Internal Medicine

## 2017-02-09 DIAGNOSIS — I87322 Chronic venous hypertension (idiopathic) with inflammation of left lower extremity: Secondary | ICD-10-CM | POA: Diagnosis not present

## 2017-02-11 NOTE — Progress Notes (Signed)
CAMARYN, LUMBERT ([id number]) Visit Report for 02/09/2017 Arrival Information Details Patient Name: [name], [name]. Date of Service: 02/09/2017 1:15 PM Medical Record Patient Account Number: [id number] Number: Treating RN: Ahmed Prima [birth year] (81 y.o. Other Clinician: Date of Birth/Sex: Female) Treating ROBSON, MICHAEL Primary Care Paisly Fingerhut: Lamonte Sakai Shamir Tuzzolino/Extender: G Referring Yeilin Zweber: Orland Mustard in Treatment: 3 Visit Information History Since Last Visit Added or deleted any medications: No Patient Arrived: Ambulatory Any new allergies or adverse reactions: No Arrival Time: 13:25 Had a fall or experienced change in No Accompanied By: self activities of daily living that may affect Transfer Assistance: None risk of falls: Patient Identification Verified: Yes Signs or symptoms of abuse/neglect since last No Secondary Verification Process Yes visito Completed: Hospitalized since last visit: No Has Dressing in Place as Prescribed: Yes Pain Present Now: No Electronic Signature(s) Signed: 02/10/2017 4:54:17 PM By: Alric Quan Entered By: Alric Quan on 02/09/2017 13:26:02 [name] ([id number]) -------------------------------------------------------------------------------- Clinic Level of Care Assessment Details Patient Name: [name]. Date of Service: 02/09/2017 1:15 PM Medical Record Patient Account Number: [id number] Number: Treating RN: Cornell Barman 1936/05/05 (81 y.o. Other Clinician: Date of Birth/Sex: Female) Treating ROBSON, [place] Primary Care Kendrick Haapala: Lamonte Sakai Zillah Alexie/Extender: G Referring Makailah Slavick: Orland Mustard in Treatment: 3 Clinic Level of Care Assessment Items TOOL 4 Quantity Score []  - Use when only an EandM is performed on FOLLOW-UP visit 0 ASSESSMENTS - Nursing Assessment / Reassessment []  - Reassessment of Co-morbidities (includes updates in patient status) 0 X - Reassessment  of Adherence to Treatment Plan 1 5 ASSESSMENTS - Wound and Skin Assessment / Reassessment X - Simple Wound Assessment / Reassessment - one wound 1 5 []  - Complex Wound Assessment / Reassessment - multiple wounds 0 []  - Dermatologic / Skin Assessment (not related to wound area) 0 ASSESSMENTS - Focused Assessment []  - Circumferential Edema Measurements - multi extremities 0 []  - Nutritional Assessment / Counseling / Intervention 0 []  - Lower Extremity Assessment (monofilament, tuning fork, pulses) 0 []  - Peripheral Arterial Disease Assessment (using hand held doppler) 0 ASSESSMENTS - Ostomy and/or Continence Assessment and Care []  - Incontinence Assessment and Management 0 []  - Ostomy Care Assessment and Management (repouching, etc.) 0 PROCESS - Coordination of Care X - Simple Patient / Family Education for ongoing care 1 15 []  - Complex (extensive) Patient / Family Education for ongoing care 0 []  - Staff obtains Programmer, systems, Records, Test Results / Process Orders 0 []  - Staff telephones HHA, Nursing Homes / Clarify orders / etc 0 [name], [name] ([id number]) []  - Routine Transfer to another Facility (non-emergent condition) 0 []  - Routine Hospital Admission (non-emergent condition) 0 []  - New Admissions / Biomedical engineer / Ordering NPWT, Apligraf, etc. 0 []  - Emergency Hospital Admission (emergent condition) 0 X - Simple Discharge Coordination 1 10 []  - Complex (extensive) Discharge Coordination 0 PROCESS - Special Needs []  - Pediatric / Minor Patient Management 0 []  - Isolation Patient Management 0 []  - Hearing / Language / Visual special needs 0 []  - Assessment of Community assistance (transportation, D/C planning, etc.) 0 []  - Additional assistance / Altered mentation 0 []  - Support Surface(s) Assessment (bed, cushion, seat, etc.) 0 INTERVENTIONS - Wound Cleansing / Measurement X - Simple Wound Cleansing - one wound 1 5 []  - Complex Wound Cleansing - multiple wounds 0 X -  Wound Imaging (photographs - any number of wounds) 1 5 []  - Wound Tracing (instead of photographs) 0 X - Simple  Wound Measurement - one wound 1 5 []  - Complex Wound Measurement - multiple wounds 0 INTERVENTIONS - Wound Dressings []  - Small Wound Dressing one or multiple wounds 0 X - Medium Wound Dressing one or multiple wounds 1 15 []  - Large Wound Dressing one or multiple wounds 0 []  - Application of Medications - topical 0 []  - Application of Medications - injection 0 [name], [name] ([id number]) INTERVENTIONS - Miscellaneous []  - External ear exam 0 []  - Specimen Collection (cultures, biopsies, blood, body fluids, etc.) 0 []  - Specimen(s) / Culture(s) sent or taken to Lab for analysis 0 []  - Patient Transfer (multiple staff / Civil Service fast streamer / Similar devices) 0 []  - Simple Staple / Suture removal (25 or less) 0 []  - Complex Staple / Suture removal (26 or more) 0 []  - Hypo / Hyperglycemic Management (close monitor of Blood Glucose) 0 []  - Ankle / Brachial Index (ABI) - do not check if billed separately 0 X - Vital Signs 1 5 Has the patient been seen at the hospital within the last three years: Yes Total Score: 70 Level Of Care: New/Established - Level 2 Electronic Signature(s) Signed: 02/09/2017 4:02:39 PM By: Gretta Cool, BSN, RN, CWS, Kim RN, BSN Entered By: Gretta Cool, BSN, RN, CWS, Kim on 02/09/2017 15:25:08 [name] ([id number]) -------------------------------------------------------------------------------- Encounter Discharge Information Details Patient Name: [name]. Date of Service: 02/09/2017 1:15 PM Medical Record Patient Account Number: [id number] Number: Treating RN: Ahmed Prima 05-09-1936 (81 y.o. Other Clinician: Date of Birth/Sex: Female) Treating ROBSON, MICHAEL Primary Care Vedh Ptacek: Lamonte Sakai Levon Boettcher/Extender: G Referring Amoni Scallan: Orland Mustard in Treatment: 3 Encounter Discharge Information Items Schedule Follow-up Appointment:  No Medication Reconciliation completed No and provided to Patient/Care Gunda Maqueda: Provided on Clinical Summary of Care: 02/09/2017 Form Type Recipient Paper Patient JG Electronic Signature(s) Signed: 02/10/2017 9:21:03 AM By: Ruthine Dose Entered By: Ruthine Dose on 02/09/2017 13:40:36 [name] ([id number]) -------------------------------------------------------------------------------- Lower Extremity Assessment Details Patient Name: [name]. Date of Service: 02/09/2017 1:15 PM Medical Record Patient Account Number: [id number] Number: Treating RN: Ahmed Prima [birth year] (81 y.o. Other Clinician: Date of Birth/Sex: Female) Treating ROBSON, MICHAEL Primary Care Taquanna Borras: Lamonte Sakai Macarius Ruark/Extender: G Referring Salimata Christenson: Orland Mustard in Treatment: 3 Vascular Assessment Pulses: Dorsalis Pedis Palpable: [Left:Yes] Posterior Tibial Extremity colors, hair growth, and conditions: Extremity Color: [Left:Hyperpigmented] Hair Growth on Extremity: [Left:No] Temperature of Extremity: [Left:Warm] Capillary Refill: [Left:< 3 seconds] Dependent Rubor: [Left:No] Blanched when Elevated: [Left:No] Lipodermatosclerosis: [Left:No] Toe Nail Assessment Left: Right: Thick: No Discolored: No Deformed: No Improper Length and Hygiene: No Electronic Signature(s) Signed: 02/10/2017 4:54:17 PM By: Alric Quan Entered By: Alric Quan on 02/09/2017 13:29:38 [name] ([id number]) -------------------------------------------------------------------------------- Multi Wound Chart Details Patient Name: Summer Lovely D. Date of Service: 02/09/2017 1:15 PM Medical Record Patient Account Number: [id number] Number: Treating RN: Ahmed Prima [birth year] (81 y.o. Other Clinician: Date of Birth/Sex: Female) Treating ROBSON, MICHAEL Primary Care Amya Hlad: Lamonte Sakai Amarise Lillo/Extender: G Referring Sephira Zellman: Orland Mustard in  Treatment: 3 Vital Signs Height(in): 68 Pulse(bpm): 68 Weight(lbs): 169 Blood Pressure 149/68 (mmHg): Body Mass Index(BMI): 26 Temperature(F): 98.2 Respiratory Rate 16 (breaths/min): Photos: [1:No Photos] [N/A:N/A] Wound Location: [1:Left Lower Leg - Anterior] [N/A:N/A] Wounding Event: [1:Trauma] [N/A:N/A] Primary Etiology: [1:Trauma, Other] [N/A:N/A] Comorbid History: [1:Received Chemotherapy, Received Radiation] [N/A:N/A] Date Acquired: [1:01/07/2017] [N/A:N/A] Weeks of Treatment: [1:3] [N/A:N/A] Wound Status: [1:Open] [N/A:N/A] Measurements L x W x D 8.5x4.5x0.1 [N/A:N/A] (cm) Area (cm) : [1:30.041] [N/A:N/A] Volume (cm) : [1:3.004] [N/A:N/A] %  Reduction in Area: [1:78.80%] [N/A:N/A] % Reduction in Volume: 98.20% [N/A:N/A] Classification: [1:Full Thickness Without Exposed Support Structures] [N/A:N/A] Exudate Amount: [1:Large] [N/A:N/A] Exudate Type: [1:Sanguinous] [N/A:N/A] Exudate Color: [1:red] [N/A:N/A] Foul Odor After [1:Yes] [N/A:N/A] Cleansing: Odor Anticipated Due to No [N/A:N/A] Product Use: Wound Margin: [1:Indistinct, nonvisible] [N/A:N/A] Granulation Amount: [1:Small (1-33%)] [N/A:N/A] Granulation Quality: [1:Red, Hyper-granulation] [N/A:N/A] Necrotic Amount: None Present (0%) N/A N/A Exposed Structures: Fat Layer (Subcutaneous N/A N/A Tissue) Exposed: Yes Fascia: No Tendon: No Muscle: No Joint: No Bone: No Epithelialization: Small (1-33%) N/A N/A Periwound Skin Texture: Excoriation: Yes N/A N/A Induration: No Callus: No Crepitus: No Rash: No Scarring: No Periwound Skin Maceration: No N/A N/A Moisture: Dry/Scaly: No Periwound Skin Color: Ecchymosis: Yes N/A N/A Atrophie Blanche: No Cyanosis: No Erythema: No Hemosiderin Staining: No Mottled: No Pallor: No Rubor: No Temperature: No Abnormality N/A N/A Tenderness on Yes N/A N/A Palpation: Wound Preparation: Ulcer Cleansing: N/A N/A Rinsed/Irrigated with Saline Topical  Anesthetic Applied: None Treatment Notes Electronic Signature(s) Signed: 02/09/2017 3:57:41 PM By: Linton Ham MD Entered By: Linton Ham on 02/09/2017 13:38:16 [name] ([id number]) -------------------------------------------------------------------------------- Multi-Disciplinary Care Plan Details Patient Name: [name]. Date of Service: 02/09/2017 1:15 PM Medical Record Patient Account Number: [id number] Number: Treating RN: Ahmed Prima [birth year] (81 y.o. Other Clinician: Date of Birth/Sex: Female) Treating ROBSON, MICHAEL Primary Care Arbie Reisz: Lamonte Sakai Maki Hege/Extender: G Referring Patriciann Becht: Orland Mustard in Treatment: 3 Active Inactive ` Abuse / Safety / Falls / Self Care Management Nursing Diagnoses: Potential for falls Goals: Patient will remain injury free related to falls Date Initiated: 01/19/2017 Target Resolution Date: 03/05/2017 Goal Status: Active Interventions: Assess fall risk on admission and as needed Notes: ` Orientation to the Wound Care Program Nursing Diagnoses: Knowledge deficit related to the wound healing center program Goals: Patient/caregiver will verbalize understanding of the [place] Program Date Initiated: 01/19/2017 Target Resolution Date: 03/05/2017 Goal Status: Active Interventions: Provide education on orientation to the wound center Notes: ` Pain, Acute or Chronic Nursing Diagnoses: [name], [name] ([id number]) Pain, acute or chronic: actual or potential Goals: Patient/caregiver will verbalize adequate pain control between visits Date Initiated: 01/19/2017 Target Resolution Date: 03/05/2017 Goal Status: Active Interventions: Assess comfort goal upon admission Notes: ` Wound/Skin Impairment Nursing Diagnoses: Impaired tissue integrity Goals: Ulcer/skin breakdown will heal within 14 weeks Date Initiated: 01/19/2017 Target Resolution Date: 04/02/2017 Goal Status:  Active Interventions: Assess patient/caregiver ability to obtain necessary supplies Assess patient/caregiver ability to perform ulcer/skin care regimen upon admission and as needed Assess ulceration(s) every visit Notes: Electronic Signature(s) Signed: 02/10/2017 4:54:17 PM By: Alric Quan Entered By: Alric Quan on 02/09/2017 13:29:43 [name] ([id number]) -------------------------------------------------------------------------------- Pain Assessment Details Patient Name: [name]. Date of Service: 02/09/2017 1:15 PM Medical Record Patient Account Number: [id number] Number: Treating RN: Ahmed Prima May 14, 1936 (81 y.o. Other Clinician: Date of Birth/Sex: Female) Treating ROBSON, MICHAEL Primary Care Rithwik Schmieg: Lamonte Sakai Allora Bains/Extender: G Referring Carzell Saldivar: Orland Mustard in Treatment: 3 Active Problems Location of Pain Severity and Description of Pain Patient Has Paino No Site Locations With Dressing Change: No Pain Management and Medication Current Pain Management: Electronic Signature(s) Signed: 02/10/2017 4:54:17 PM By: Alric Quan Entered By: Alric Quan on 02/09/2017 [name], Summer D. ([id number]) -------------------------------------------------------------------------------- Wound Assessment Details Patient Name: [name]. Date of Service: 02/09/2017 1:15 PM Medical Record Patient Account Number: [id number] Number: Treating RN: Ahmed Prima 10-Jan-1936 (81 y.o. Other Clinician: Date of Birth/Sex: Female) Treating ROBSON, [place] Primary Care Caera Enwright: Humphrey Rolls,  Neelam Camary Sosa/Extender: G Referring Leveon Pelzer: Lamonte Sakai Weeks in Treatment: 3 Wound Status Wound Number: 1 Primary Trauma, Other Etiology: Wound Location: Left Lower Leg - Anterior Wound Status: Open Wounding Event: Trauma Comorbid Received Chemotherapy, Received Date Acquired: 01/07/2017 History: Radiation Weeks Of  Treatment: 3 Clustered Wound: No Photos Photo Uploaded By: Gretta Cool, BSN, RN, CWS, Kim on 02/09/2017 15:46:51 Wound Measurements Length: (cm) 8.5 Width: (cm) 4.5 Depth: (cm) 0.1 Area: (cm) 30.041 Volume: (cm) 3.004 % Reduction in Area: 78.8% % Reduction in Volume: 98.2% Epithelialization: Small (1-33%) Tunneling: No Undermining: No Wound Description Full Thickness Without Exposed Classification: Support Structures Wound Margin: Indistinct, nonvisible Exudate Large Amount: Exudate Type: Sanguinous Exudate Color: red Foul Odor After Cleansing: Yes Due to Product Use: No Slough/Fibrino Yes Wound Bed Granulation Amount: Small (1-33%) Exposed Structure Granulation Quality: Red, Hyper-granulation Fascia Exposed: No Necrotic Amount: None Present (0%) Fat Layer (Subcutaneous Tissue) Exposed: Yes [name], [name] ([id number]) Tendon Exposed: No Muscle Exposed: No Joint Exposed: No Bone Exposed: No Periwound Skin Texture Texture Color No Abnormalities Noted: No No Abnormalities Noted: No Callus: No Atrophie Blanche: No Crepitus: No Cyanosis: No Excoriation: Yes Ecchymosis: Yes Induration: No Erythema: No Rash: No Hemosiderin Staining: No Scarring: No Mottled: No Pallor: No Moisture Rubor: No No Abnormalities Noted: No Dry / Scaly: No Temperature / Pain Maceration: No Temperature: No Abnormality Tenderness on Palpation: Yes Wound Preparation Ulcer Cleansing: Rinsed/Irrigated with Saline Topical Anesthetic Applied: None Electronic Signature(s) Signed: 02/10/2017 4:54:17 PM By: Alric Quan Entered By: Alric Quan on 02/09/2017 13:28:59 [name] ([id number]) -------------------------------------------------------------------------------- Vitals Details Patient Name: [name]. Date of Service: 02/09/2017 1:15 PM Medical Record Patient Account Number: [id number] Number: Treating RN: Ahmed Prima [birth year] (81 y.o. Other  Clinician: Date of Birth/Sex: Female) Treating ROBSON, MICHAEL Primary Care Alben Jepsen: Lamonte Sakai Aunisty Reali/Extender: G Referring Kensi Karr: Orland Mustard in Treatment: 3 Vital Signs Time Taken: 13:26 Temperature (F): 98.2 Height (in): 68 Pulse (bpm): 68 Weight (lbs): 169 Respiratory Rate (breaths/min): 16 Body Mass Index (BMI): 25.7 Blood Pressure (mmHg): 149/68 Reference Range: 80 - 120 mg / dl Electronic Signature(s) Signed: 02/10/2017 4:54:17 PM By: Alric Quan Entered By: Alric Quan on 02/09/2017 13:27:25
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
## 2017-02-18 ENCOUNTER — Encounter: Payer: Medicare Other | Admitting: Surgery

## 2017-02-18 DIAGNOSIS — I87322 Chronic venous hypertension (idiopathic) with inflammation of left lower extremity: Secondary | ICD-10-CM | POA: Diagnosis not present
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
## 2017-02-22 NOTE — Progress Notes (Signed)
[name], [name] ([id number]) Visit Report for 02/18/2017 Arrival Information Details Patient Name: [name], [name]. Date of Service: 02/18/2017 8:00 AM Medical Record Number: [id number] Patient Account Number: [id number] Date of Birth/Sex: 04-21-1936 (81 y.o. Female) Treating RN: Carolyne Fiscal, Debi Primary Care Beck Cofer: Lamonte Sakai Other Clinician: Referring Fidelia Cathers: Lamonte Sakai Treating Tyara Dassow/Extender: Frann Rider in Treatment: 4 Visit Information History Since Last Visit All ordered tests and consults were completed: No Patient Arrived: Ambulatory Added or deleted any medications: No Arrival Time: 08:02 Any new allergies or adverse reactions: No Accompanied By: self Had a fall or experienced change in No Transfer Assistance: None activities of daily living that may affect Patient Identification Verified: Yes risk of falls: Secondary Verification Process Yes Signs or symptoms of abuse/neglect since last No Completed: visito Patient Requires Transmission-Based No Hospitalized since last visit: No Precautions: Has Dressing in Place as Prescribed: Yes Patient Has Alerts: No Has Compression in Place as Prescribed: No Pain Present Now: No Electronic Signature(s) Signed: 02/19/2017 5:11:04 PM By: Alric Quan Entered By: Alric Quan on 02/18/2017 08:07:17 [name] ([id number]) -------------------------------------------------------------------------------- Clinic Level of Care Assessment Details Patient Name: [name]. Date of Service: 02/18/2017 8:00 AM Medical Record Number: [id number] Patient Account Number: [id number] Date of Birth/Sex: 12/10/1935 (81 y.o. Female) Treating RN: Carolyne Fiscal, Debi Primary Care Calvina Liptak: Lamonte Sakai Other Clinician: Referring Brayla Pat: Lamonte Sakai Treating Keymarion Bearman/Extender: Frann Rider in Treatment: 4 Clinic Level of Care Assessment Items TOOL 4 Quantity Score X - Use when only an EandM is performed  on FOLLOW-UP visit 1 0 ASSESSMENTS - Nursing Assessment / Reassessment X - Reassessment of Co-morbidities (includes updates in patient status) 1 10 X - Reassessment of Adherence to Treatment Plan 1 5 ASSESSMENTS - Wound and Skin Assessment / Reassessment X - Simple Wound Assessment / Reassessment - one wound 1 5 []  - Complex Wound Assessment / Reassessment - multiple wounds 0 []  - Dermatologic / Skin Assessment (not related to wound area) 0 ASSESSMENTS - Focused Assessment []  - Circumferential Edema Measurements - multi extremities 0 []  - Nutritional Assessment / Counseling / Intervention 0 []  - Lower Extremity Assessment (monofilament, tuning fork, pulses) 0 []  - Peripheral Arterial Disease Assessment (using hand held doppler) 0 ASSESSMENTS - Ostomy and/or Continence Assessment and Care []  - Incontinence Assessment and Management 0 []  - Ostomy Care Assessment and Management (repouching, etc.) 0 PROCESS - Coordination of Care []  - Simple Patient / Family Education for ongoing care 0 X - Complex (extensive) Patient / Family Education for ongoing care 1 20 X - Staff obtains Programmer, systems, Records, Test Results / Process Orders 1 10 X - Staff telephones HHA, Nursing Homes / Clarify orders / etc 1 10 []  - Routine Transfer to another Facility (non-emergent condition) 0 [name], [name] ([id number]) []  - Routine Hospital Admission (non-emergent condition) 0 []  - New Admissions / Biomedical engineer / Ordering NPWT, Apligraf, etc. 0 []  - Emergency Hospital Admission (emergent condition) 0 X - Simple Discharge Coordination 1 10 []  - Complex (extensive) Discharge Coordination 0 PROCESS - Special Needs []  - Pediatric / Minor Patient Management 0 []  - Isolation Patient Management 0 []  - Hearing / Language / Visual special needs 0 []  - Assessment of Community assistance (transportation, Hopkins/C planning, etc.) 0 []  - Additional assistance / Altered mentation 0 []  - Support Surface(s) Assessment  (bed, cushion, seat, etc.) 0 INTERVENTIONS - Wound Cleansing / Measurement X - Simple Wound Cleansing - one wound 1 5 []  - Complex Wound Cleansing -  multiple wounds 0 X - Wound Imaging (photographs - any number of wounds) 1 5 []  - Wound Tracing (instead of photographs) 0 X - Simple Wound Measurement - one wound 1 5 []  - Complex Wound Measurement - multiple wounds 0 INTERVENTIONS - Wound Dressings []  - Small Wound Dressing one or multiple wounds 0 X - Medium Wound Dressing one or multiple wounds 1 15 []  - Large Wound Dressing one or multiple wounds 0 X - Application of Medications - topical 1 5 []  - Application of Medications - injection 0 INTERVENTIONS - Miscellaneous []  - External ear exam 0 [name], [name] ([id number]) []  - Specimen Collection (cultures, biopsies, blood, body fluids, etc.) 0 []  - Specimen(s) / Culture(s) sent or taken to Lab for analysis 0 []  - Patient Transfer (multiple staff / Harrel Lemon Lift / Similar devices) 0 []  - Simple Staple / Suture removal (25 or less) 0 []  - Complex Staple / Suture removal (26 or more) 0 []  - Hypo / Hyperglycemic Management (close monitor of Blood Glucose) 0 []  - Ankle / Brachial Index (ABI) - do not check if billed separately 0 X - Vital Signs 1 5 Has the patient been seen at the hospital within the last three years: Yes Total Score: 110 Level Of Care: New/Established - Level 3 Electronic Signature(s) Signed: 02/19/2017 5:11:04 PM By: Alric Quan Entered By: Alric Quan on 02/18/2017 08:49:09 [name] ([id number]) -------------------------------------------------------------------------------- Encounter Discharge Information Details Patient Name: [name]. Date of Service: 02/18/2017 8:00 AM Medical Record Number: [id number] Patient Account Number: [id number] Date of Birth/Sex: 23-Dec-1935 (81 y.o. Female) Treating RN: Ahmed Prima Primary Care Davell Beckstead: Lamonte Sakai Other Clinician: Referring Pranavi Aure: Lamonte Sakai Treating Leitha Hyppolite/Extender: Frann Rider in Treatment: 4 Encounter Discharge Information Items Discharge Pain Level: 0 Discharge Condition: Stable Ambulatory Status: Ambulatory Discharge Destination: Home Transportation: Private Auto Accompanied By: self Schedule Follow-up Appointment: Yes Medication Reconciliation completed and provided to Patient/Care No [name]: Provided on Clinical Summary of Care: 02/18/2017 Form Type Recipient Paper Patient JG Electronic Signature(s) Signed: 02/22/2017 9:26:52 AM By: Ruthine Dose Entered By: Ruthine Dose on 02/18/2017 08:39:25 [name] ([id number]) -------------------------------------------------------------------------------- Lower Extremity Assessment Details Patient Name: [name]. Date of Service: 02/18/2017 8:00 AM Medical Record Number: [id number] Patient Account Number: [id number] Date of Birth/Sex: August 29, 1935 (81 y.o. Female) Treating RN: Carolyne Fiscal, Debi Primary Care Lakeyia Surber: Lamonte Sakai Other Clinician: Referring Verlin Duke: Lamonte Sakai Treating Caira Poche/Extender: Frann Rider in Treatment: 4 Vascular Assessment Pulses: Dorsalis Pedis Palpable: [Left:Yes] Posterior Tibial Extremity colors, hair growth, and conditions: Extremity Color: [Left:Hyperpigmented] Temperature of Extremity: [Left:Warm] Capillary Refill: [Left:< 3 seconds] Toe Nail Assessment Left: Right: Thick: Yes Discolored: No Deformed: No Improper Length and Hygiene: No Electronic Signature(s) Signed: 02/19/2017 5:11:04 PM By: Alric Quan Entered By: Alric Quan on 02/18/2017 08:15:41 [name] ([id number]) -------------------------------------------------------------------------------- Multi Wound Chart Details Patient Name: [name]. Date of Service: 02/18/2017 8:00 AM Medical Record Number: [id number] Patient Account Number: [id number] Date of Birth/Sex: [birth year] (81 y.o. Female) Treating  RN: Ahmed Prima Primary Care Bernardette Waldron: Lamonte Sakai Other Clinician: Referring Destiney Sanabia: Lamonte Sakai Treating Kevyn Boquet/Extender: Frann Rider in Treatment: 4 Vital Signs Height(in): 68 Pulse(bpm): 72 Weight(lbs): 169 Blood Pressure 140/77 (mmHg): Body Mass Index(BMI): 26 Temperature(F): 98.2 Respiratory Rate 16 (breaths/min): Photos: [1:No Photos] [N/A:N/A] Wound Location: [1:Left Lower Leg - Anterior] [N/A:N/A] Wounding Event: [1:Trauma] [N/A:N/A] Primary Etiology: [1:Trauma, Other] [N/A:N/A] Comorbid History: [1:Received Chemotherapy, Received Radiation] [N/A:N/A] Date Acquired: [1:01/07/2017] [N/A:N/A] Weeks of Treatment: [1:4] [N/A:N/A] Wound Status: [  1:Open] [N/A:N/A] Measurements L x W x Hopkins 7.3x2.8x0.1 [N/A:N/A] (cm) Area (cm) : [1:16.054] [N/A:N/A] Volume (cm) : [1:1.605] [N/A:N/A] % Reduction in Area: [1:88.70%] [N/A:N/A] % Reduction in Volume: 99.10% [N/A:N/A] Classification: [1:Full Thickness Without Exposed Support Structures] [N/A:N/A] Exudate Amount: [1:Large] [N/A:N/A] Exudate Type: [1:Sanguinous] [N/A:N/A] Exudate Color: [1:red] [N/A:N/A] Wound Margin: [1:Indistinct, nonvisible] [N/A:N/A] Granulation Amount: [1:Large (67-100%)] [N/A:N/A] Granulation Quality: [1:Red, Hyper-granulation] [N/A:N/A] Necrotic Amount: [1:Small (1-33%)] [N/A:N/A] Exposed Structures: [1:Fat Layer (Subcutaneous Tissue) Exposed: Yes Fascia: No Tendon: No Muscle: No] [N/A:N/A] Joint: No Bone: No Epithelialization: Small (1-33%) N/A N/A Periwound Skin Texture: Excoriation: Yes N/A N/A Induration: No Callus: No Crepitus: No Rash: No Scarring: No Periwound Skin Maceration: No N/A N/A Moisture: Dry/Scaly: No Periwound Skin Color: Ecchymosis: Yes N/A N/A Atrophie Blanche: No Cyanosis: No Erythema: No Hemosiderin Staining: No Mottled: No Pallor: No Rubor: No Temperature: No Abnormality N/A N/A Tenderness on Yes N/A N/A Palpation: Wound Preparation: Ulcer  Cleansing: N/A N/A Rinsed/Irrigated with Saline Topical Anesthetic Applied: Other: lidocaine 4% Treatment Notes Electronic Signature(s) Signed: 02/18/2017 2:14:22 PM By: Christin Fudge MD, FACS Entered By: Christin Fudge on 02/18/2017 08:33:46 [name] ([id number]) -------------------------------------------------------------------------------- [place] Details Patient Name: [name]. Date of Service: 02/18/2017 8:00 AM Medical Record Number: [id number] Patient Account Number: [id number] Date of Birth/Sex: 12-Feb-1936 (81 y.o. Female) Treating RN: Carolyne Fiscal, Debi Primary Care Adryan Druckenmiller: Lamonte Sakai Other Clinician: Referring Adonia Porada: Lamonte Sakai Treating Haston Casebolt/Extender: Frann Rider in Treatment: 4 Active Inactive ` Abuse / Safety / Falls / Self Care Management Nursing Diagnoses: Potential for falls Goals: Patient will remain injury free related to falls Date Initiated: 01/19/2017 Target Resolution Date: 03/05/2017 Goal Status: Active Interventions: Assess fall risk on admission and as needed Notes: ` Orientation to the Wound Care Program Nursing Diagnoses: Knowledge deficit related to the wound healing center program Goals: Patient/caregiver will verbalize understanding of the [facility] Program Date Initiated: 01/19/2017 Target Resolution Date: 03/05/2017 Goal Status: Active Interventions: Provide education on orientation to the wound center Notes: ` Pain, Acute or Chronic Nursing Diagnoses: Pain, acute or chronic: actual or potential [name], [name] ([id number]) Goals: Patient/caregiver will verbalize adequate pain control between visits Date Initiated: 01/19/2017 Target Resolution Date: 03/05/2017 Goal Status: Active Interventions: Assess comfort goal upon admission Notes: ` Wound/Skin Impairment Nursing Diagnoses: Impaired tissue integrity Goals: Ulcer/skin breakdown will heal within 14 weeks Date  Initiated: 01/19/2017 Target Resolution Date: 04/02/2017 Goal Status: Active Interventions: Assess patient/caregiver ability to obtain necessary supplies Assess patient/caregiver ability to perform ulcer/skin care regimen upon admission and as needed Assess ulceration(s) every visit Notes: Electronic Signature(s) Signed: 02/19/2017 5:11:04 PM By: Alric Quan Entered By: Alric Quan on 02/18/2017 08:16:49 [name] ([id number]) -------------------------------------------------------------------------------- Pain Assessment Details Patient Name: [name]. Date of Service: 02/18/2017 8:00 AM Medical Record Number: [id number] Patient Account Number: [id number] Date of Birth/Sex: [birth year] (81 y.o. Female) Treating RN: Ahmed Prima Primary Care Carisma Troupe: Lamonte Sakai Other Clinician: Referring Amaira Safley: Lamonte Sakai Treating Isaid Salvia/Extender: Frann Rider in Treatment: 4 Active Problems Location of Pain Severity and Description of Pain Patient Has Paino No Site Locations Pain Management and Medication Current Pain Management: Electronic Signature(s) Signed: 02/19/2017 5:11:04 PM By: Alric Quan Entered By: Alric Quan on 02/18/2017 08:07:22 [name] ([id number]) -------------------------------------------------------------------------------- Patient/Caregiver Education Details Patient Name: [name]. Date of Service: 02/18/2017 8:00 AM Medical Record Number: [id number] Patient Account Number: [id number] Date of Birth/Gender: [birth year] (81 y.o. Female) Treating RN: Ahmed Prima Primary Care Physician: Lamonte Sakai Other Clinician:  Referring Physician: Lamonte Sakai Treating Physician/Extender: Frann Rider in Treatment: 4 Education Assessment Education Provided To: Patient Education Topics Provided Wound/Skin Impairment: Handouts: Other: change dressing as ordered Methods: Demonstration,  Explain/Verbal Responses: State content correctly Electronic Signature(s) Signed: 02/19/2017 5:11:04 PM By: Alric Quan Entered By: Alric Quan on 02/18/2017 08:29:38 [name] ([id number]) -------------------------------------------------------------------------------- Wound Assessment Details Patient Name: [name]. Date of Service: 02/18/2017 8:00 AM Medical Record Number: [id number] Patient Account Number: [id number] Date of Birth/Sex: 08-Nov-1935 (81 y.o. Female) Treating RN: Carolyne Fiscal, Debi Primary Care Krissy Orebaugh: Lamonte Sakai Other Clinician: Referring Kathe Wirick: Lamonte Sakai Treating Kordae Buonocore/Extender: Frann Rider in Treatment: 4 Wound Status Wound Number: 1 Primary Trauma, Other Etiology: Wound Location: Left Lower Leg - Anterior Wound Status: Open Wounding Event: Trauma Comorbid Received Chemotherapy, Received Date Acquired: 01/07/2017 History: Radiation Weeks Of Treatment: 4 Clustered Wound: No Photos Photo Uploaded By: Alric Quan on 02/18/2017 16:27:52 Wound Measurements Length: (cm) 7.3 Width: (cm) 2.8 Depth: (cm) 0.1 Area: (cm) 16.054 Volume: (cm) 1.605 % Reduction in Area: 88.7% % Reduction in Volume: 99.1% Epithelialization: Small (1-33%) Tunneling: No Undermining: No Wound Description Full Thickness Without Exposed Classification: Support Structures Wound Margin: Indistinct, nonvisible Exudate Large Amount: Exudate Type: Sanguinous Exudate Color: red Foul Odor After Cleansing: No Slough/Fibrino Yes Wound Bed Granulation Amount: Large (67-100%) Exposed Structure Granulation Quality: Red, Hyper-granulation Fascia Exposed: No Necrotic Amount: Small (1-33%) Fat Layer (Subcutaneous Tissue) Exposed: Yes [name], [name] ([id number]) Necrotic Quality: Adherent Slough Tendon Exposed: No Muscle Exposed: No Joint Exposed: No Bone Exposed: No Periwound Skin Texture Texture Color No Abnormalities Noted: No No  Abnormalities Noted: No Callus: No Atrophie Blanche: No Crepitus: No Cyanosis: No Excoriation: Yes Ecchymosis: Yes Induration: No Erythema: No Rash: No Hemosiderin Staining: No Scarring: No Mottled: No Pallor: No Moisture Rubor: No No Abnormalities Noted: No Dry / Scaly: No Temperature / Pain Maceration: No Temperature: No Abnormality Tenderness on Palpation: Yes Wound Preparation Ulcer Cleansing: Rinsed/Irrigated with Saline Topical Anesthetic Applied: Other: lidocaine 4%, Treatment Notes Wound #1 (Left, Anterior Lower Leg) 1. Cleansed with: Clean wound with Normal Saline 2. Anesthetic Topical Lidocaine 4% cream to wound bed prior to debridement 4. Dressing Applied: Hydrafera Blue 5. Secondary Dressing Applied ABD Pad Kerlix/Conform 7. Secured with Tape Notes netting Electronic Signature(s) Signed: 02/19/2017 5:11:04 PM By: Alric Quan Entered By: Alric Quan on 02/18/2017 08:15:18 [name] ([id number]) -------------------------------------------------------------------------------- Vitals Details Patient Name: [name]. Date of Service: 02/18/2017 8:00 AM Medical Record Number: [id number] Patient Account Number: [id number] Date of Birth/Sex: [birth year] (81 y.o. Female) Treating RN: Carolyne Fiscal, Debi Primary Care Jemiah Cuadra: Lamonte Sakai Other Clinician: Referring Mikhia Dusek: Lamonte Sakai Treating Odessa Morren/Extender: Frann Rider in Treatment: 4 Vital Signs Time Taken: 08:07 Temperature (F): 98.2 Height (in): 68 Pulse (bpm): 72 Weight (lbs): 169 Respiratory Rate (breaths/min): 16 Body Mass Index (BMI): 25.7 Blood Pressure (mmHg): 140/77 Reference Range: 80 - 120 mg / dl Electronic Signature(s) Signed: 02/19/2017 5:11:04 PM By: Alric Quan Entered By: Alric Quan on 02/18/2017 08:12:00

## 2017-02-23 ENCOUNTER — Encounter: Payer: Medicare Other | Admitting: Internal Medicine

## 2017-02-23 DIAGNOSIS — I87322 Chronic venous hypertension (idiopathic) with inflammation of left lower extremity: Secondary | ICD-10-CM | POA: Diagnosis not present
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
## 2017-02-24 NOTE — Progress Notes (Signed)
[name] ([id number]) Visit Report for 02/23/2017 Arrival Information Details Patient Name: IZZIE, [name]. Date of Service: 02/23/2017 12:30 PM Medical Record Patient Account Number: [id number] Number: Treating RN: [name] Jul 29, 1935 (81 y.o. Other Clinician: Date of Birth/Sex: Female) Treating ROBSON, Hopkins Primary Care Sandra Tellefsen: Lamonte Sakai Tyria Springer/Extender: G Referring Lore Polka: Orland Mustard in Treatment: 5 Visit Information History Since Last Visit All ordered tests and consults were completed: No Patient Arrived: Ambulatory Added or deleted any medications: No Arrival Time: 12:35 Any new allergies or adverse reactions: No Accompanied By: self Had a fall or experienced change in No Transfer Assistance: None activities of daily living that may affect Patient Identification Verified: Yes risk of falls: Secondary Verification Process Yes Signs or symptoms of abuse/neglect since last No Completed: visito Patient Requires Transmission-Based No Hospitalized since last visit: No Precautions: Pain Present Now: No Patient Has Alerts: No Electronic Signature(s) Signed: 02/23/2017 4:23:28 PM By: [name] Entered By: [name] on 02/23/2017 12:36:54 [name] ([id number]) -------------------------------------------------------------------------------- Clinic Level of Care Assessment Details Patient Name: [name]. Date of Service: 02/23/2017 12:30 PM Medical Record Patient Account Number: [id number] Number: Treating RN: [name] 03-29-1936 (81 y.o. Other Clinician: Date of Birth/Sex: Female) Treating ROBSON, Hopkins Primary Care Yukari Flax: Lamonte Sakai Andriana Casa/Extender: G Referring Katelen Luepke: Orland Mustard in Treatment: 5 Clinic Level of Care Assessment Items TOOL 4 Quantity Score X - Use when only an EandM is performed on FOLLOW-UP visit 1 0 ASSESSMENTS - Nursing Assessment /  Reassessment X - Reassessment of Co-morbidities (includes updates in patient status) 1 10 X - Reassessment of Adherence to Treatment Plan 1 5 ASSESSMENTS - Wound and Skin Assessment / Reassessment X - Simple Wound Assessment / Reassessment - one wound 1 5 []  - Complex Wound Assessment / Reassessment - multiple wounds 0 []  - Dermatologic / Skin Assessment (not related to wound area) 0 ASSESSMENTS - Focused Assessment []  - Circumferential Edema Measurements - multi extremities 0 []  - Nutritional Assessment / Counseling / Intervention 0 []  - Lower Extremity Assessment (monofilament, tuning fork, pulses) 0 []  - Peripheral Arterial Disease Assessment (using hand held doppler) 0 ASSESSMENTS - Ostomy and/or Continence Assessment and Care []  - Incontinence Assessment and Management 0 []  - Ostomy Care Assessment and Management (repouching, etc.) 0 PROCESS - Coordination of Care []  - Simple Patient / Family Education for ongoing care 0 X - Complex (extensive) Patient / Family Education for ongoing care 1 20 X - Staff obtains Programmer, systems, Records, Test Results / Process Orders 1 10 X - Staff telephones HHA, Nursing Homes / Clarify orders / etc 1 10 [name], Summer D. ([id number]) []  - Routine Transfer to another Facility (non-emergent condition) 0 []  - Routine Hospital Admission (non-emergent condition) 0 []  - New Admissions / Biomedical engineer / Ordering NPWT, Apligraf, etc. 0 []  - Emergency Hospital Admission (emergent condition) 0 X - Simple Discharge Coordination 1 10 []  - Complex (extensive) Discharge Coordination 0 PROCESS - Special Needs []  - Pediatric / Minor Patient Management 0 []  - Isolation Patient Management 0 []  - Hearing / Language / Visual special needs 0 []  - Assessment of Community assistance (transportation, D/C planning, etc.) 0 []  - Additional assistance / Altered mentation 0 []  - Support Surface(s) Assessment (bed, cushion, seat, etc.) 0 INTERVENTIONS - Wound Cleansing /  Measurement X - Simple Wound Cleansing - one wound 1 5 []  - Complex Wound Cleansing - multiple wounds 0 X - Wound Imaging (photographs - any number of  wounds) 1 5 []  - Wound Tracing (instead of photographs) 0 X - Simple Wound Measurement - one wound 1 5 []  - Complex Wound Measurement - multiple wounds 0 INTERVENTIONS - Wound Dressings X - Small Wound Dressing one or multiple wounds 1 10 []  - Medium Wound Dressing one or multiple wounds 0 []  - Large Wound Dressing one or multiple wounds 0 X - Application of Medications - topical 1 5 []  - Application of Medications - injection 0 [name], [name] ([id number]) INTERVENTIONS - Miscellaneous []  - External ear exam 0 []  - Specimen Collection (cultures, biopsies, blood, body fluids, etc.) 0 []  - Specimen(s) / Culture(s) sent or taken to Lab for analysis 0 []  - Patient Transfer (multiple staff / Harrel Lemon Lift / Similar devices) 0 []  - Simple Staple / Suture removal (25 or less) 0 []  - Complex Staple / Suture removal (26 or more) 0 []  - Hypo / Hyperglycemic Management (close monitor of Blood Glucose) 0 []  - Ankle / Brachial Index (ABI) - do not check if billed separately 0 X - Vital Signs 1 5 Has the patient been seen at the hospital within the last three years: Yes Total Score: 105 Level Of Care: New/Established - Level 3 Electronic Signature(s) Signed: 02/23/2017 4:23:28 PM By: [name] Entered By: [name] on 02/23/2017 13:02:11 [name] ([id number]) -------------------------------------------------------------------------------- Encounter Discharge Information Details Patient Name: [name]. Date of Service: 02/23/2017 12:30 PM Medical Record Patient Account Number: [id number] Number: Treating RN: [name] 28-Apr-1936 (81 y.o. Other Clinician: Date of Birth/Sex: Female) Treating ROBSON, Hopkins Primary Care Stephaie Dardis: Lamonte Sakai [place]/Extender: G Referring Brennan Karam: Orland Mustard  in Treatment: 5 Encounter Discharge Information Items Discharge Pain Level: 0 Discharge Condition: Stable Ambulatory Status: Ambulatory Discharge Destination: Home Transportation: Private Auto Accompanied By: self Schedule Follow-up Appointment: Yes Medication Reconciliation completed and provided to Patient/Care No Chassidy Layson: Provided on Clinical Summary of Care: 02/23/2017 Form Type Recipient Paper Patient JG Electronic Signature(s) Signed: 02/23/2017 4:23:28 PM By: [name] Entered By: [name] on 02/23/2017 13:00:57 [name] ([id number]) -------------------------------------------------------------------------------- Lower Extremity Assessment Details Patient Name: [name]. Date of Service: 02/23/2017 12:30 PM Medical Record Patient Account Number: [id number] Number: Treating RN: [name] 11-Oct-1935 (81 y.o. Other Clinician: Date of Birth/Sex: Female) Treating ROBSON, Hopkins Primary Care Liya Strollo: Lamonte Sakai Stormi Vandevelde/Extender: G Referring Shamarcus Hoheisel: Orland Mustard in Treatment: 5 Vascular Assessment Pulses: Dorsalis Pedis Palpable: [Left:Yes] Posterior Tibial Extremity colors, hair growth, and conditions: Extremity Color: [Left:Hyperpigmented] Temperature of Extremity: [Left:Warm] Capillary Refill: [Left:< 3 seconds] Toe Nail Assessment Left: Right: Thick: Yes Discolored: No Deformed: No Improper Length and Hygiene: No Electronic Signature(s) Signed: 02/23/2017 4:23:28 PM By: [name] Entered By: [name] on 02/23/2017 12:45:11 [name] ([id number]) -------------------------------------------------------------------------------- Multi Wound Chart Details Patient Name: Summer Lovely D. Date of Service: 02/23/2017 12:30 PM Medical Record Patient Account Number: [id number] Number: Treating RN: [name] 06-08-1935 (81 y.o. Other Clinician: Date of Birth/Sex: Female)  Treating ROBSON, Hopkins Primary Care Gayland Nicol: Lamonte Sakai Vedha Tercero/Extender: G Referring Linzie Boursiquot: Orland Mustard in Treatment: 5 Vital Signs Height(in): 68 Pulse(bpm): 63 Weight(lbs): 169 Blood Pressure 160/61 (mmHg): Body Mass Index(BMI): 26 Temperature(F): 98.0 Respiratory Rate 16 (breaths/min): Photos: [1:No Photos] [N/A:N/A] Wound Location: [1:Left Lower Leg - Anterior] [N/A:N/A] Wounding Event: [1:Trauma] [N/A:N/A] Primary Etiology: [1:Trauma, Other] [N/A:N/A] Comorbid History: [1:Received Chemotherapy, Received Radiation] [N/A:N/A] Date Acquired: [1:01/07/2017] [N/A:N/A] Weeks of Treatment: [1:5] [N/A:N/A] Wound Status: [1:Open] [N/A:N/A] Measurements L x W x D 7.3x2x0.1 [  N/A:N/A] (cm) Area (cm) : [1:11.467] [N/A:N/A] Volume (cm) : [1:1.147] [N/A:N/A] % Reduction in Area: [1:91.90%] [N/A:N/A] % Reduction in Volume: 99.30% [N/A:N/A] Classification: [1:Full Thickness Without Exposed Support Structures] [N/A:N/A] Exudate Amount: [1:Large] [N/A:N/A] Exudate Type: [1:Sanguinous] [N/A:N/A] Exudate Color: [1:red] [N/A:N/A] Wound Margin: [1:Indistinct, nonvisible] [N/A:N/A] Granulation Amount: [1:Large (67-100%)] [N/A:N/A] Granulation Quality: [1:Red, Hyper-granulation] [N/A:N/A] Necrotic Amount: [1:Small (1-33%)] [N/A:N/A] Exposed Structures: [1:Fat Layer (Subcutaneous Tissue) Exposed: Yes Fascia: No] [N/A:N/A] Tendon: No Muscle: No Joint: No Bone: No Epithelialization: Small (1-33%) N/A N/A Periwound Skin Texture: Excoriation: Yes N/A N/A Induration: No Callus: No Crepitus: No Rash: No Scarring: No Periwound Skin Maceration: No N/A N/A Moisture: Dry/Scaly: No Periwound Skin Color: Ecchymosis: Yes N/A N/A Atrophie Blanche: No Cyanosis: No Erythema: No Hemosiderin Staining: No Mottled: No Pallor: No Rubor: No Temperature: No Abnormality N/A N/A Tenderness on Yes N/A N/A Palpation: Wound Preparation: Ulcer Cleansing: N/A N/A Rinsed/Irrigated  with Saline Topical Anesthetic Applied: Other: lidocaine 4% Treatment Notes Electronic Signature(s) Signed: 02/23/2017 4:14:11 PM By: Linton Ham MD Entered By: Linton Ham on 02/23/2017 12:57:26 [name] ([id number]) -------------------------------------------------------------------------------- [place] Details Patient Name: [name]. Date of Service: 02/23/2017 12:30 PM Medical Record Patient Account Number: [id number] Number: Treating RN: [name] 1935/12/03 (81 y.o. Other Clinician: Date of Birth/Sex: Female) Treating ROBSON, Hopkins Primary Care Desi Carby: Lamonte Sakai Legion Discher/Extender: G Referring Pama Roskos: Orland Mustard in Treatment: 5 Active Inactive ` Abuse / Safety / Falls / Self Care Management Nursing Diagnoses: Potential for falls Goals: Patient will remain injury free related to falls Date Initiated: 01/19/2017 Target Resolution Date: 03/05/2017 Goal Status: Active Interventions: Assess fall risk on admission and as needed Notes: ` Orientation to the Wound Care Program Nursing Diagnoses: Knowledge deficit related to the wound healing center program Goals: Patient/caregiver will verbalize understanding of the [place] Program Date Initiated: 01/19/2017 Target Resolution Date: 03/05/2017 Goal Status: Active Interventions: Provide education on orientation to the wound center Notes: ` Pain, Acute or Chronic Nursing Diagnoses: [name], [name] ([id number]) Pain, acute or chronic: actual or potential Goals: Patient/caregiver will verbalize adequate pain control between visits Date Initiated: 01/19/2017 Target Resolution Date: 03/05/2017 Goal Status: Active Interventions: Assess comfort goal upon admission Notes: ` Wound/Skin Impairment Nursing Diagnoses: Impaired tissue integrity Goals: Ulcer/skin breakdown will heal within 14 weeks Date Initiated: 01/19/2017 Target  Resolution Date: 04/02/2017 Goal Status: Active Interventions: Assess patient/caregiver ability to obtain necessary supplies Assess patient/caregiver ability to perform ulcer/skin care regimen upon admission and as needed Assess ulceration(s) every visit Notes: Electronic Signature(s) Signed: 02/23/2017 4:23:28 PM By: [name] Entered By: [name] on 02/23/2017 12:45:35 [name] ([id number]) -------------------------------------------------------------------------------- Pain Assessment Details Patient Name: [name]. Date of Service: 02/23/2017 12:30 PM Medical Record Patient Account Number: [id number] Number: Treating RN: [name] 10/25/1935 (81 y.o. Other Clinician: Date of Birth/Sex: Female) Treating ROBSON, Hopkins Primary Care Mali Eppard: Lamonte Sakai Apple Dearmas/Extender: G Referring Saron Tweed: Orland Mustard in Treatment: 5 Active Problems Location of Pain Severity and Description of Pain Patient Has Paino No Site Locations Pain Management and Medication Current Pain Management: Electronic Signature(s) Signed: 02/23/2017 4:23:28 PM By: [name] Entered By: [name] on 02/23/2017 12:37:02 [name] ([id number]) -------------------------------------------------------------------------------- Patient/Caregiver Education Details Patient Name: [name]. Date of Service: 02/23/2017 12:30 PM Medical Record Patient Account Number: [id number] Number: Treating RN: [name] 1936/03/10 (81 y.o. Other Clinician: Date of Birth/Gender: Female) Treating ROBSON, [place] Primary Care Physician: Lamonte Sakai Physician/Extender: G Referring Physician: Humphrey Rolls,  Neelam Weeks in Treatment: 5 Education Assessment Education Provided To: Patient Education Topics Provided Wound/Skin Impairment: Handouts: Other: change dressing as ordered Methods: Demonstration, Explain/Verbal Responses: State  content correctly Electronic Signature(s) Signed: 02/23/2017 4:23:28 PM By: [name] Entered By: [name] on 02/23/2017 13:01:07 [name] ([id number]) -------------------------------------------------------------------------------- Wound Assessment Details Patient Name: [name]. Date of Service: 02/23/2017 12:30 PM Medical Record Patient Account Number: [id number] Number: Treating RN: [name] 08-11-1935 (81 y.o. Other Clinician: Date of Birth/Sex: Female) Treating ROBSON, Hopkins Primary Care Burnie Therien: Lamonte Sakai Loran Auguste/Extender: G Referring Bogdan Vivona: Orland Mustard in Treatment: 5 Wound Status Wound Number: 1 Primary Trauma, Other Etiology: Wound Location: Left Lower Leg - Anterior Wound Status: Open Wounding Event: Trauma Comorbid Received Chemotherapy, Received Date Acquired: 01/07/2017 History: Radiation Weeks Of Treatment: 5 Clustered Wound: No Photos Photo Uploaded By: [name] on 02/23/2017 16:19:48 Wound Measurements Length: (cm) 7.3 Width: (cm) 2 Depth: (cm) 0.1 Area: (cm) 11.467 Volume: (cm) 1.147 % Reduction in Area: 91.9% % Reduction in Volume: 99.3% Epithelialization: Small (1-33%) Tunneling: No Undermining: No Wound Description Full Thickness Without Exposed Classification: Support Structures Wound Margin: Indistinct, nonvisible Exudate Large Amount: Exudate Type: Sanguinous Exudate Color: red Foul Odor After Cleansing: No Slough/Fibrino Yes Wound Bed Granulation Amount: Large (67-100%) Exposed Structure [name], [name] ([id number]) Granulation Quality: Red, Hyper-granulation Fascia Exposed: No Necrotic Amount: Small (1-33%) Fat Layer (Subcutaneous Tissue) Exposed: Yes Necrotic Quality: Adherent Slough Tendon Exposed: No Muscle Exposed: No Joint Exposed: No Bone Exposed: No Periwound Skin Texture Texture Color No Abnormalities Noted: No No Abnormalities Noted:  No Callus: No Atrophie Blanche: No Crepitus: No Cyanosis: No Excoriation: Yes Ecchymosis: Yes Induration: No Erythema: No Rash: No Hemosiderin Staining: No Scarring: No Mottled: No Pallor: No Moisture Rubor: No No Abnormalities Noted: No Dry / Scaly: No Temperature / Pain Maceration: No Temperature: No Abnormality Tenderness on Palpation: Yes Wound Preparation Ulcer Cleansing: Rinsed/Irrigated with Saline Topical Anesthetic Applied: Other: lidocaine 4%, Treatment Notes Wound #1 (Left, Anterior Lower Leg) 1. Cleansed with: Clean wound with Normal Saline 2. Anesthetic Topical Lidocaine 4% cream to wound bed prior to debridement 4. Dressing Applied: Hydrafera Blue Other dressing (specify in notes) 5. Secondary Dressing Applied ABD Pad Kerlix/Conform 7. Secured with Tape Notes Mepitel One, netting Electronic Signature(s) Signed: 02/23/2017 4:23:28 PM By: [name] Entered By: [name] on 02/23/2017 12:43:37 [name] ([id number]) [name] ([id number]) -------------------------------------------------------------------------------- Vitals Details Patient Name: [name]. Date of Service: 02/23/2017 12:30 PM Medical Record Patient Account Number: [id number] Number: Treating RN: [name] 1935-10-11 (81 y.o. Other Clinician: Date of Birth/Sex: Female) Treating ROBSON, Hopkins Primary Care Stepehn Eckard: Lamonte Sakai Brinkley Peet/Extender: G Referring Zylah Elsbernd: Orland Mustard in Treatment: 5 Vital Signs Time Taken: 12:37 Temperature (F): 98.0 Height (in): 68 Pulse (bpm): 63 Weight (lbs): 169 Respiratory Rate (breaths/min): 16 Body Mass Index (BMI): 25.7 Blood Pressure (mmHg): 160/61 Reference Range: 80 - 120 mg / dl Electronic Signature(s) Signed: 02/23/2017 4:23:28 PM By: [name] Entered By: [name] on 02/23/2017 12:43:04

## 2017-03-09 ENCOUNTER — Encounter: Payer: Medicare Other | Attending: Internal Medicine | Admitting: Internal Medicine

## 2017-03-09 DIAGNOSIS — I87322 Chronic venous hypertension (idiopathic) with inflammation of left lower extremity: Secondary | ICD-10-CM | POA: Insufficient documentation

## 2017-03-09 DIAGNOSIS — Z923 Personal history of irradiation: Secondary | ICD-10-CM | POA: Insufficient documentation

## 2017-03-09 DIAGNOSIS — Z853 Personal history of malignant neoplasm of breast: Secondary | ICD-10-CM | POA: Diagnosis not present

## 2017-03-09 DIAGNOSIS — X58XXXD Exposure to other specified factors, subsequent encounter: Secondary | ICD-10-CM | POA: Diagnosis not present

## 2017-03-09 DIAGNOSIS — J449 Chronic obstructive pulmonary disease, unspecified: Secondary | ICD-10-CM | POA: Diagnosis not present

## 2017-03-09 DIAGNOSIS — G629 Polyneuropathy, unspecified: Secondary | ICD-10-CM | POA: Insufficient documentation

## 2017-03-09 DIAGNOSIS — K7460 Unspecified cirrhosis of liver: Secondary | ICD-10-CM | POA: Insufficient documentation

## 2017-03-09 DIAGNOSIS — F17200 Nicotine dependence, unspecified, uncomplicated: Secondary | ICD-10-CM | POA: Insufficient documentation

## 2017-03-09 DIAGNOSIS — Z9221 Personal history of antineoplastic chemotherapy: Secondary | ICD-10-CM | POA: Diagnosis not present

## 2017-03-09 DIAGNOSIS — S81812D Laceration without foreign body, left lower leg, subsequent encounter: Secondary | ICD-10-CM | POA: Insufficient documentation

## 2017-03-10 NOTE — Progress Notes (Signed)
SUZETTE, FLAGLER ([id number]) Visit Report for 03/09/2017 Arrival Information Details Patient Name: [name], [name]. Date of Service: 03/09/2017 12:30 PM Medical Record Patient Account Number: [id number] Number: Treating RN: Ahmed Prima March 31, 1936 (81 y.o. Other Clinician: Date of Birth/Sex: Female) Treating ROBSON, MICHAEL Primary Care Hung Rhinesmith: Lamonte Sakai Areyanna Figeroa/Extender: G Referring Riannah Stagner: Orland Mustard in Treatment: 7 Visit Information History Since Last Visit All ordered tests and consults were completed: No Patient Arrived: Ambulatory Added or deleted any medications: No Arrival Time: 12:33 Any new allergies or adverse reactions: No Accompanied By: self Had a fall or experienced change in No Transfer Assistance: None activities of daily living that may affect Patient Identification Verified: Yes risk of falls: Secondary Verification Process Yes Signs or symptoms of abuse/neglect since last No Completed: visito Patient Requires Transmission-Based No Hospitalized since last visit: No Precautions: Has Dressing in Place as Prescribed: Yes Patient Has Alerts: No Pain Present Now: No Electronic Signature(s) Signed: 03/09/2017 4:19:28 PM By: Alric Quan Entered By: Alric Quan on 03/09/2017 12:34:17 [name] ([id number]) -------------------------------------------------------------------------------- Clinic Level of Care Assessment Details Patient Name: [name]. Date of Service: 03/09/2017 12:30 PM Medical Record Patient Account Number: [id number] Number: Treating RN: Ahmed Prima 1935/08/27 (81 y.o. Other Clinician: Date of Birth/Sex: Female) Treating ROBSON, Butler Primary Care Capri Raben: Lamonte Sakai Misako Roeder/Extender: G Referring Herbert Aguinaldo: Orland Mustard in Treatment: 7 Clinic Level of Care Assessment Items TOOL 4 Quantity Score X - Use when only an EandM is performed on FOLLOW-UP visit 1  0 ASSESSMENTS - Nursing Assessment / Reassessment X - Reassessment of Co-morbidities (includes updates in patient status) 1 10 X - Reassessment of Adherence to Treatment Plan 1 5 ASSESSMENTS - Wound and Skin Assessment / Reassessment X - Simple Wound Assessment / Reassessment - one wound 1 5 []  - Complex Wound Assessment / Reassessment - multiple wounds 0 []  - Dermatologic / Skin Assessment (not related to wound area) 0 ASSESSMENTS - Focused Assessment []  - Circumferential Edema Measurements - multi extremities 0 []  - Nutritional Assessment / Counseling / Intervention 0 []  - Lower Extremity Assessment (monofilament, tuning fork, pulses) 0 []  - Peripheral Arterial Disease Assessment (using hand held doppler) 0 ASSESSMENTS - Ostomy and/or Continence Assessment and Care []  - Incontinence Assessment and Management 0 []  - Ostomy Care Assessment and Management (repouching, etc.) 0 PROCESS - Coordination of Care []  - Simple Patient / Family Education for ongoing care 0 X - Complex (extensive) Patient / Family Education for ongoing care 1 20 X - Staff obtains Programmer, systems, Records, Test Results / Process Orders 1 10 X - Staff telephones HHA, Nursing Homes / Clarify orders / etc 1 10 [name], [name]. ([id number]) []  - Routine Transfer to another Facility (non-emergent condition) 0 []  - Routine Hospital Admission (non-emergent condition) 0 []  - New Admissions / Biomedical engineer / Ordering NPWT, Apligraf, etc. 0 []  - Emergency Hospital Admission (emergent condition) 0 X - Simple Discharge Coordination 1 10 []  - Complex (extensive) Discharge Coordination 0 PROCESS - Special Needs []  - Pediatric / Minor Patient Management 0 []  - Isolation Patient Management 0 []  - Hearing / Language / Visual special needs 0 []  - Assessment of Community assistance (transportation, D/C planning, etc.) 0 []  - Additional assistance / Altered mentation 0 []  - Support Surface(s) Assessment (bed, cushion, seat,  etc.) 0 INTERVENTIONS - Wound Cleansing / Measurement X - Simple Wound Cleansing - one wound 1 5 []  - Complex Wound Cleansing - multiple wounds 0 X -  Wound Imaging (photographs - any number of wounds) 1 5 []  - Wound Tracing (instead of photographs) 0 X - Simple Wound Measurement - one wound 1 5 []  - Complex Wound Measurement - multiple wounds 0 INTERVENTIONS - Wound Dressings X - Small Wound Dressing one or multiple wounds 1 10 []  - Medium Wound Dressing one or multiple wounds 0 []  - Large Wound Dressing one or multiple wounds 0 X - Application of Medications - topical 1 5 []  - Application of Medications - injection 0 [name], [name] ([id number]) INTERVENTIONS - Miscellaneous []  - External ear exam 0 []  - Specimen Collection (cultures, biopsies, blood, body fluids, etc.) 0 []  - Specimen(s) / Culture(s) sent or taken to Lab for analysis 0 []  - Patient Transfer (multiple staff / Harrel Lemon Lift / Similar devices) 0 []  - Simple Staple / Suture removal (25 or less) 0 []  - Complex Staple / Suture removal (26 or more) 0 []  - Hypo / Hyperglycemic Management (close monitor of Blood Glucose) 0 []  - Ankle / Brachial Index (ABI) - do not check if billed separately 0 X - Vital Signs 1 5 Has the patient been seen at the hospital within the last three years: Yes Total Score: 105 Level Of Care: New/Established - Level 3 Electronic Signature(s) Signed: 03/09/2017 4:19:28 PM By: Alric Quan Entered By: Alric Quan on 03/09/2017 13:25:24 [name] ([id number]) -------------------------------------------------------------------------------- Encounter Discharge Information Details Patient Name: [name]. Date of Service: 03/09/2017 12:30 PM Medical Record Patient Account Number: [id number] Number: Treating RN: Ahmed Prima 13-Nov-1935 (81 y.o. Other Clinician: Date of Birth/Sex: Female) Treating ROBSON, MICHAEL Primary Care Annastasia Haskins: Lamonte Sakai Waymon Laser/Extender:  G Referring Shamus Desantis: Orland Mustard in Treatment: 7 Encounter Discharge Information Items Discharge Pain Level: 0 Discharge Condition: Stable Ambulatory Status: Ambulatory Discharge Destination: Home Transportation: Private Auto Accompanied By: self Schedule Follow-up Appointment: Yes Medication Reconciliation completed and provided to Patient/Care No Dayln Tugwell: Provided on Clinical Summary of Care: 03/09/2017 Form Type Recipient Paper Patient JG Electronic Signature(s) Signed: 03/09/2017 4:19:28 PM By: Alric Quan Entered By: Alric Quan on 03/09/2017 13:06:06 [name] ([id number]) -------------------------------------------------------------------------------- Lower Extremity Assessment Details Patient Name: [name]. Date of Service: 03/09/2017 12:30 PM Medical Record Patient Account Number: [id number] Number: Treating RN: Ahmed Prima [birth year] (81 y.o. Other Clinician: Date of Birth/Sex: Female) Treating ROBSON, MICHAEL Primary Care Elizebeth Kluesner: Lamonte Sakai Timiko Offutt/Extender: G Referring Averi Cacioppo: Orland Mustard in Treatment: 7 Vascular Assessment Pulses: Dorsalis Pedis Palpable: [Left:Yes] Posterior Tibial Extremity colors, hair growth, and conditions: Extremity Color: [Left:Hyperpigmented] Temperature of Extremity: [Left:Warm] Capillary Refill: [Left:< 3 seconds] Toe Nail Assessment Left: Right: Thick: Yes Discolored: No Deformed: No Improper Length and Hygiene: No Electronic Signature(s) Signed: 03/09/2017 4:19:28 PM By: Alric Quan Entered By: Alric Quan on 03/09/2017 12:45:10 [name] ([id number]) -------------------------------------------------------------------------------- Multi Wound Chart Details Patient Name: Summer Lovely D. Date of Service: 03/09/2017 12:30 PM Medical Record Patient Account Number: [id number] Number: Treating RN: Ahmed Prima 1936-03-18 (81 y.o. Other  Clinician: Date of Birth/Sex: Female) Treating ROBSON, MICHAEL Primary Care Consuello Lassalle: Lamonte Sakai Malayja Freund/Extender: G Referring Sota Hetz: Orland Mustard in Treatment: 7 Vital Signs Height(in): 68 Pulse(bpm): 70 Weight(lbs): 169 Blood Pressure 124/62 (mmHg): Body Mass Index(BMI): 26 Temperature(F): 97.7 Respiratory Rate 18 (breaths/min): Photos: [1:No Photos] [N/A:N/A] Wound Location: [1:Left Lower Leg - Anterior] [N/A:N/A] Wounding Event: [1:Trauma] [N/A:N/A] Primary Etiology: [1:Trauma, Other] [N/A:N/A] Comorbid History: [1:Received Chemotherapy, Received Radiation] [N/A:N/A] Date Acquired: [1:01/07/2017] [N/A:N/A] Weeks of Treatment: [1:7] [N/A:N/A] Wound Status: [1:Open] [N/A:N/A]  Measurements L x W x D 1x0.8x0.1 [N/A:N/A] (cm) Area (cm) : [1:0.628] [N/A:N/A] Volume (cm) : [1:0.063] [N/A:N/A] % Reduction in Area: [1:99.60%] [N/A:N/A] % Reduction in Volume: 100.00% [N/A:N/A] Classification: [1:Full Thickness Without Exposed Support Structures] [N/A:N/A] Exudate Amount: [1:Large] [N/A:N/A] Exudate Type: [1:Sanguinous] [N/A:N/A] Exudate Color: [1:red] [N/A:N/A] Wound Margin: [1:Indistinct, nonvisible] [N/A:N/A] Granulation Amount: [1:Large (67-100%)] [N/A:N/A] Granulation Quality: [1:Red, Hyper-granulation] [N/A:N/A] Necrotic Amount: [1:Small (1-33%)] [N/A:N/A] Exposed Structures: [1:Fat Layer (Subcutaneous Tissue) Exposed: Yes Fascia: No] [N/A:N/A] Tendon: No Muscle: No Joint: No Bone: No Epithelialization: Small (1-33%) N/A N/A Periwound Skin Texture: Excoriation: Yes N/A N/A Induration: No Callus: No Crepitus: No Rash: No Scarring: No Periwound Skin Maceration: No N/A N/A Moisture: Dry/Scaly: No Periwound Skin Color: Ecchymosis: Yes N/A N/A Atrophie Blanche: No Cyanosis: No Erythema: No Hemosiderin Staining: No Mottled: No Pallor: No Rubor: No Temperature: No Abnormality N/A N/A Tenderness on Yes N/A N/A Palpation: Wound  Preparation: Ulcer Cleansing: N/A N/A Rinsed/Irrigated with Saline Topical Anesthetic Applied: Other: lidocaine 4% Treatment Notes Electronic Signature(s) Signed: 03/09/2017 4:33:30 PM By: Linton Ham MD Entered By: Linton Ham on 03/09/2017 13:04:46 [name] ([id number]) -------------------------------------------------------------------------------- [place] Details Patient Name: [name]. Date of Service: 03/09/2017 12:30 PM Medical Record Patient Account Number: [id number] Number: Treating RN: Ahmed Prima 1935-10-08 (81 y.o. Other Clinician: Date of Birth/Sex: Female) Treating ROBSON, MICHAEL Primary Care Alle Difabio: Lamonte Sakai Jolon Degante/Extender: G Referring Alvan Culpepper: Orland Mustard in Treatment: 7 Active Inactive ` Abuse / Safety / Falls / Self Care Management Nursing Diagnoses: Potential for falls Goals: Patient will remain injury free related to falls Date Initiated: 01/19/2017 Target Resolution Date: 03/05/2017 Goal Status: Active Interventions: Assess fall risk on admission and as needed Notes: ` Orientation to the Wound Care Program Nursing Diagnoses: Knowledge deficit related to the wound healing center program Goals: Patient/caregiver will verbalize understanding of the [facility] Date Initiated: 01/19/2017 Target Resolution Date: 03/05/2017 Goal Status: Active Interventions: Provide education on orientation to the wound center Notes: ` Pain, Acute or Chronic Nursing Diagnoses: [name], [name] ([id number]) Pain, acute or chronic: actual or potential Goals: Patient/caregiver will verbalize adequate pain control between visits Date Initiated: 01/19/2017 Target Resolution Date: 03/05/2017 Goal Status: Active Interventions: Assess comfort goal upon admission Notes: ` Wound/Skin Impairment Nursing Diagnoses: Impaired tissue integrity Goals: Ulcer/skin breakdown will heal  within 14 weeks Date Initiated: 01/19/2017 Target Resolution Date: 04/02/2017 Goal Status: Active Interventions: Assess patient/caregiver ability to obtain necessary supplies Assess patient/caregiver ability to perform ulcer/skin care regimen upon admission and as needed Assess ulceration(s) every visit Notes: Electronic Signature(s) Signed: 03/09/2017 4:19:28 PM By: Alric Quan Entered By: Alric Quan on 03/09/2017 12:46:55 [name] ([id number]) -------------------------------------------------------------------------------- Pain Assessment Details Patient Name: [name]. Date of Service: 03/09/2017 12:30 PM Medical Record Patient Account Number: [id number] Number: Treating RN: Ahmed Prima 10/14/1935 (81 y.o. Other Clinician: Date of Birth/Sex: Female) Treating ROBSON, MICHAEL Primary Care Carnita Golob: Lamonte Sakai Yon Schiffman/Extender: G Referring Dariel Pellecchia: Orland Mustard in Treatment: 7 Active Problems Location of Pain Severity and Description of Pain Patient Has Paino No Site Locations Pain Management and Medication Current Pain Management: Electronic Signature(s) Signed: 03/09/2017 4:19:28 PM By: Alric Quan Entered By: Alric Quan on 03/09/2017 12:34:23 [name] ([id number]) -------------------------------------------------------------------------------- Patient/Caregiver Education Details Patient Name: [name]. Date of Service: 03/09/2017 12:30 PM Medical Record Patient Account Number: [id number] Number: Treating RN: Ahmed Prima April 17, 1936 (81 y.o. Other Clinician: Date of Birth/Gender: Female) Treating ROBSON, Inger Primary Care Physician:  Lamonte Sakai Physician/Extender: Darnell Level Referring Physician: Orland Mustard in Treatment: 7 Education Assessment Education Provided To: Patient Education Topics Provided Wound/Skin Impairment: Handouts: Other: change dressing as ordered Methods:  Demonstration, Explain/Verbal Responses: State content correctly Electronic Signature(s) Signed: 03/09/2017 4:19:28 PM By: Alric Quan Entered By: Alric Quan on 03/09/2017 [name], Summer D. ([id number]) -------------------------------------------------------------------------------- Wound Assessment Details Patient Name: [name]. Date of Service: 03/09/2017 12:30 PM Medical Record Patient Account Number: [id number] Number: Treating RN: Ahmed Prima 10-Apr-1936 (81 y.o. Other Clinician: Date of Birth/Sex: Female) Treating ROBSON, MICHAEL Primary Care Finbar Nippert: Lamonte Sakai Kassy Mcenroe/Extender: G Referring Elease Swarm: Orland Mustard in Treatment: 7 Wound Status Wound Number: 1 Primary Trauma, Other Etiology: Wound Location: Left Lower Leg - Anterior Wound Status: Open Wounding Event: Trauma Comorbid Received Chemotherapy, Received Date Acquired: 01/07/2017 History: Radiation Weeks Of Treatment: 7 Clustered Wound: No Photos Photo Uploaded By: Alric Quan on 03/09/2017 14:30:59 Wound Measurements Length: (cm) 1 Width: (cm) 0.8 Depth: (cm) 0.1 Area: (cm) 0.628 Volume: (cm) 0.063 % Reduction in Area: 99.6% % Reduction in Volume: 100% Epithelialization: Small (1-33%) Tunneling: No Undermining: No Wound Description Full Thickness Without Exposed Classification: Support Structures Wound Margin: Indistinct, nonvisible Exudate Large Amount: Exudate Type: Sanguinous Exudate Color: red Foul Odor After Cleansing: No Slough/Fibrino Yes Wound Bed Granulation Amount: Large (67-100%) Exposed Structure [name], [name] ([id number]) Granulation Quality: Red, Hyper-granulation Fascia Exposed: No Necrotic Amount: Small (1-33%) Fat Layer (Subcutaneous Tissue) Exposed: Yes Necrotic Quality: Adherent Slough Tendon Exposed: No Muscle Exposed: No Joint Exposed: No Bone Exposed: No Periwound Skin Texture Texture Color No Abnormalities  Noted: No No Abnormalities Noted: No Callus: No Atrophie Blanche: No Crepitus: No Cyanosis: No Excoriation: Yes Ecchymosis: Yes Induration: No Erythema: No Rash: No Hemosiderin Staining: No Scarring: No Mottled: No Pallor: No Moisture Rubor: No No Abnormalities Noted: No Dry / Scaly: No Temperature / Pain Maceration: No Temperature: No Abnormality Tenderness on Palpation: Yes Wound Preparation Ulcer Cleansing: Rinsed/Irrigated with Saline Topical Anesthetic Applied: Other: lidocaine 4%, Treatment Notes Wound #1 (Left, Anterior Lower Leg) 1. Cleansed with: Clean wound with Normal Saline 2. Anesthetic Topical Lidocaine 4% cream to wound bed prior to debridement 4. Dressing Applied: Hydrafera Blue Other dressing (specify in notes) 5. Secondary Dressing Applied ABD Pad Dry Gauze Kerlix/Conform 7. Secured with Tape Notes Mepitel One, netting Electronic Signature(s) Signed: 03/09/2017 4:19:28 PM By: Anda Latina ([id number]) Entered By: Alric Quan on 03/09/2017 12:43:55 [name] ([id number]) -------------------------------------------------------------------------------- [place] Details Patient Name: [name]. Date of Service: 03/09/2017 12:30 PM Medical Record Patient Account Number: [id number] Number: Treating RN: Ahmed Prima January 09, 1936 (81 y.o. Other Clinician: Date of Birth/Sex: Female) Treating ROBSON, MICHAEL Primary Care Ladonte Verstraete: Lamonte Sakai Catriona Dillenbeck/Extender: G Referring Georgi Tuel: Orland Mustard in Treatment: 7 Vital Signs Time Taken: 12:38 Temperature (F): 97.7 Height (in): 68 Pulse (bpm): 70 Weight (lbs): 169 Respiratory Rate (breaths/min): 18 Body Mass Index (BMI): 25.7 Blood Pressure (mmHg): 124/62 Reference Range: 80 - 120 mg / dl Electronic Signature(s) Signed: 03/09/2017 4:19:28 PM By: Alric Quan Entered By: Alric Quan on 03/09/2017 12:38:22
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
## 2017-03-16 ENCOUNTER — Encounter: Payer: Medicare Other | Admitting: Internal Medicine

## 2017-03-16 DIAGNOSIS — I87322 Chronic venous hypertension (idiopathic) with inflammation of left lower extremity: Secondary | ICD-10-CM | POA: Diagnosis not present

## 2017-03-17 NOTE — Progress Notes (Signed)
SAWSAN, RIGGIO ([id number]) Visit Report for 03/16/2017 Arrival Information Details Patient Name: [name], [name]. Date of Service: 03/16/2017 3:15 PM Medical Record Patient Account Number: [id number] Number: Treating RN: Ahmed Prima 1936/04/20 (81 y.o. Other Clinician: Date of Birth/Sex: Female) Treating ROBSON, MICHAEL Primary Care Purvis Sidle: Lamonte Sakai Andrea Ferrer/Extender: G Referring Gunter Conde: Orland Mustard in Treatment: 8 Visit Information History Since Last Visit All ordered tests and consults were completed: No Patient Arrived: Ambulatory Added or deleted any medications: No Arrival Time: 15:27 Any new allergies or adverse reactions: No Accompanied By: self Had a fall or experienced change in No Transfer Assistance: None activities of daily living that may affect Patient Identification Verified: Yes risk of falls: Secondary Verification Process Yes Signs or symptoms of abuse/neglect since last No Completed: visito Patient Requires Transmission-Based No Hospitalized since last visit: No Precautions: Has Dressing in Place as Prescribed: Yes Patient Has Alerts: No Pain Present Now: No Electronic Signature(s) Signed: 03/16/2017 5:01:03 PM By: Alric Quan Entered By: Alric Quan on 03/16/2017 15:27:35 [name] ([id number]) -------------------------------------------------------------------------------- Clinic Level of Care Assessment Details Patient Name: [name]. Date of Service: 03/16/2017 3:15 PM Medical Record Patient Account Number: [id number] Number: Treating RN: Ahmed Prima 1935-07-30 (81 y.o. Other Clinician: Date of Birth/Sex: Female) Treating ROBSON, [place] Primary Care Crayton Savarese: Lamonte Sakai Delan Ksiazek/Extender: G Referring Reshma Hoey: Orland Mustard in Treatment: 8 Clinic Level of Care Assessment Items TOOL 4 Quantity Score X - Use when only an EandM is performed on FOLLOW-UP visit 1  0 ASSESSMENTS - Nursing Assessment / Reassessment X - Reassessment of Co-morbidities (includes updates in patient status) 1 10 X - Reassessment of Adherence to Treatment Plan 1 5 ASSESSMENTS - Wound and Skin Assessment / Reassessment X - Simple Wound Assessment / Reassessment - one wound 1 5 []  - Complex Wound Assessment / Reassessment - multiple wounds 0 []  - Dermatologic / Skin Assessment (not related to wound area) 0 ASSESSMENTS - Focused Assessment []  - Circumferential Edema Measurements - multi extremities 0 []  - Nutritional Assessment / Counseling / Intervention 0 []  - Lower Extremity Assessment (monofilament, tuning fork, pulses) 0 []  - Peripheral Arterial Disease Assessment (using hand held doppler) 0 ASSESSMENTS - Ostomy and/or Continence Assessment and Care []  - Incontinence Assessment and Management 0 []  - Ostomy Care Assessment and Management (repouching, etc.) 0 PROCESS - Coordination of Care []  - Simple Patient / Family Education for ongoing care 0 X - Complex (extensive) Patient / Family Education for ongoing care 1 20 X - Staff obtains Programmer, systems, Records, Test Results / Process Orders 1 10 X - Staff telephones HHA, Nursing Homes / Clarify orders / etc 1 10 XIN, KLAWITTER. ([id number]) []  - Routine Transfer to another Facility (non-emergent condition) 0 []  - Routine Hospital Admission (non-emergent condition) 0 []  - New Admissions / Biomedical engineer / Ordering NPWT, Apligraf, etc. 0 []  - Emergency Hospital Admission (emergent condition) 0 X - Simple Discharge Coordination 1 10 []  - Complex (extensive) Discharge Coordination 0 PROCESS - Special Needs []  - Pediatric / Minor Patient Management 0 []  - Isolation Patient Management 0 []  - Hearing / Language / Visual special needs 0 []  - Assessment of Community assistance (transportation, D/C planning, etc.) 0 []  - Additional assistance / Altered mentation 0 []  - Support Surface(s) Assessment (bed, cushion, seat,  etc.) 0 INTERVENTIONS - Wound Cleansing / Measurement X - Simple Wound Cleansing - one wound 1 5 []  - Complex Wound Cleansing - multiple wounds 0 X -  Wound Imaging (photographs - any number of wounds) 1 5 []  - Wound Tracing (instead of photographs) 0 X - Simple Wound Measurement - one wound 1 5 []  - Complex Wound Measurement - multiple wounds 0 INTERVENTIONS - Wound Dressings X - Small Wound Dressing one or multiple wounds 1 10 []  - Medium Wound Dressing one or multiple wounds 0 []  - Large Wound Dressing one or multiple wounds 0 X - Application of Medications - topical 1 5 []  - Application of Medications - injection 0 [name], [name] ([id number]) INTERVENTIONS - Miscellaneous []  - External ear exam 0 []  - Specimen Collection (cultures, biopsies, blood, body fluids, etc.) 0 []  - Specimen(s) / Culture(s) sent or taken to Lab for analysis 0 []  - Patient Transfer (multiple staff / Harrel Lemon Lift / Similar devices) 0 []  - Simple Staple / Suture removal (25 or less) 0 []  - Complex Staple / Suture removal (26 or more) 0 []  - Hypo / Hyperglycemic Management (close monitor of Blood Glucose) 0 []  - Ankle / Brachial Index (ABI) - do not check if billed separately 0 X - Vital Signs 1 5 Has the patient been seen at the hospital within the last three years: Yes Total Score: 105 Level Of Care: New/Established - Level 3 Electronic Signature(s) Signed: 03/16/2017 5:01:03 PM By: Alric Quan Entered By: Alric Quan on 03/16/2017 16:48:49 [name] ([id number]) -------------------------------------------------------------------------------- Encounter Discharge Information Details Patient Name: [name]. Date of Service: 03/16/2017 3:15 PM Medical Record Patient Account Number: [id number] Number: Treating RN: Ahmed Prima 07/22/1935 (81 y.o. Other Clinician: Date of Birth/Sex: Female) Treating ROBSON, MICHAEL Primary Care Donicia Druck: Lamonte Sakai Abdulrahim Siddiqi/Extender:  G Referring Mardell Suttles: Orland Mustard in Treatment: 8 Encounter Discharge Information Items Discharge Pain Level: 0 Discharge Condition: Stable Ambulatory Status: Ambulatory Discharge Destination: Home Transportation: Private Auto Accompanied By: self Schedule Follow-up Appointment: Yes Medication Reconciliation completed and provided to Patient/Care No Kairos Panetta: Provided on Clinical Summary of Care: 03/16/2017 Form Type Recipient Paper Patient JG Electronic Signature(s) Signed: 03/16/2017 4:13:00 PM By: Alric Quan Entered By: Alric Quan on 03/16/2017 16:12:59 [name] ([id number]) -------------------------------------------------------------------------------- Lower Extremity Assessment Details Patient Name: [name]. Date of Service: 03/16/2017 3:15 PM Medical Record Patient Account Number: [id number] Number: Treating RN: Ahmed Prima 1935/12/20 (81 y.o. Other Clinician: Date of Birth/Sex: Female) Treating ROBSON, MICHAEL Primary Care Migdalia Olejniczak: Lamonte Sakai Bethannie Iglehart/Extender: G Referring Maryruth Apple: Orland Mustard in Treatment: 8 Vascular Assessment Pulses: Dorsalis Pedis Palpable: [Left:Yes] Posterior Tibial Extremity colors, hair growth, and conditions: Extremity Color: [Left:Hyperpigmented] Temperature of Extremity: [Left:Warm] Capillary Refill: [Left:< 3 seconds] Toe Nail Assessment Left: Right: Thick: No Discolored: No Deformed: No Improper Length and Hygiene: No Electronic Signature(s) Signed: 03/16/2017 5:01:03 PM By: Alric Quan Entered By: Alric Quan on 03/16/2017 15:48:40 [name] ([id number]) -------------------------------------------------------------------------------- Multi Wound Chart Details Patient Name: Summer Lovely D. Date of Service: 03/16/2017 3:15 PM Medical Record Patient Account Number: [id number] Number: Treating RN: Ahmed Prima 09-Sep-1935 (81 y.o.  Other Clinician: Date of Birth/Sex: Female) Treating ROBSON, MICHAEL Primary Care Yunior Jain: Lamonte Sakai Dereck Agerton/Extender: G Referring Averiana Clouatre: Orland Mustard in Treatment: 8 Vital Signs Height(in): 68 Pulse(bpm): 66 Weight(lbs): 169 Blood Pressure 154/70 (mmHg): Body Mass Index(BMI): 26 Temperature(F): 98.0 Respiratory Rate 18 (breaths/min): Photos: [1:No Photos] [N/A:N/A] Wound Location: [1:Left Lower Leg - Anterior] [N/A:N/A] Wounding Event: [1:Trauma] [N/A:N/A] Primary Etiology: [1:Trauma, Other] [N/A:N/A] Comorbid History: [1:Received Chemotherapy, Received Radiation] [N/A:N/A] Date Acquired: [1:01/07/2017] [N/A:N/A] Weeks of Treatment: [1:8] [N/A:N/A] Wound Status: [1:Open] [N/A:N/A]  Measurements L x W x D 0.5x0.5x0.1 [N/A:N/A] (cm) Area (cm) : [1:0.196] [N/A:N/A] Volume (cm) : [1:0.02] [N/A:N/A] % Reduction in Area: [1:99.90%] [N/A:N/A] % Reduction in Volume: 100.00% [N/A:N/A] Classification: [1:Full Thickness Without Exposed Support Structures] [N/A:N/A] Exudate Amount: [1:Large] [N/A:N/A] Exudate Type: [1:Sanguinous] [N/A:N/A] Exudate Color: [1:red] [N/A:N/A] Wound Margin: [1:Indistinct, nonvisible] [N/A:N/A] Granulation Amount: [1:Large (67-100%)] [N/A:N/A] Granulation Quality: [1:Red, Hyper-granulation] [N/A:N/A] Necrotic Amount: [1:Small (1-33%)] [N/A:N/A] Exposed Structures: [1:Fat Layer (Subcutaneous Tissue) Exposed: Yes Fascia: No] [N/A:N/A] Tendon: No Muscle: No Joint: No Bone: No Epithelialization: Small (1-33%) N/A N/A Periwound Skin Texture: Excoriation: Yes N/A N/A Induration: No Callus: No Crepitus: No Rash: No Scarring: No Periwound Skin Maceration: No N/A N/A Moisture: Dry/Scaly: No Periwound Skin Color: Ecchymosis: Yes N/A N/A Atrophie Blanche: No Cyanosis: No Erythema: No Hemosiderin Staining: No Mottled: No Pallor: No Rubor: No Temperature: No Abnormality N/A N/A Tenderness on Yes N/A N/A Palpation: Wound  Preparation: Ulcer Cleansing: N/A N/A Rinsed/Irrigated with Saline Topical Anesthetic Applied: Other: lidocaine 4% Treatment Notes Wound #1 (Left, Anterior Lower Leg) 1. Cleansed with: Clean wound with Normal Saline 2. Anesthetic Topical Lidocaine 4% cream to wound bed prior to debridement 4. Dressing Applied: Hydrafera Blue Contact layer 5. Secondary Dressing Applied ABD Pad Kerlix/Conform 7. Secured with Tape Notes Mepitel One, netting [name], [name] ([id number]) Electronic Signature(s) Signed: 03/16/2017 4:50:14 PM By: Linton Ham MD Entered By: Linton Ham on 03/16/2017 16:36:56 [name] ([id number]) -------------------------------------------------------------------------------- [place] Details Patient Name: [name]. Date of Service: 03/16/2017 3:15 PM Medical Record Patient Account Number: [id number] Number: Treating RN: Ahmed Prima [birth year] (81 y.o. Other Clinician: Date of Birth/Sex: Female) Treating ROBSON, MICHAEL Primary Care Yi Falletta: Lamonte Sakai Adwoa Axe/Extender: G Referring Johnjoseph Rolfe: Orland Mustard in Treatment: 8 Active Inactive ` Abuse / Safety / Falls / Self Care Management Nursing Diagnoses: Potential for falls Goals: Patient will remain injury free related to falls Date Initiated: 01/19/2017 Target Resolution Date: 03/05/2017 Goal Status: Active Interventions: Assess fall risk on admission and as needed Notes: ` Orientation to the Wound Care Program Nursing Diagnoses: Knowledge deficit related to the wound healing center program Goals: Patient/caregiver will verbalize understanding of the [facility] Date Initiated: 01/19/2017 Target Resolution Date: 03/05/2017 Goal Status: Active Interventions: Provide education on orientation to the wound center Notes: ` Pain, Acute or Chronic Nursing Diagnoses: [name], [name] ([id number]) Pain, acute or chronic:  actual or potential Goals: Patient/caregiver will verbalize adequate pain control between visits Date Initiated: 01/19/2017 Target Resolution Date: 03/05/2017 Goal Status: Active Interventions: Assess comfort goal upon admission Notes: ` Wound/Skin Impairment Nursing Diagnoses: Impaired tissue integrity Goals: Ulcer/skin breakdown will heal within 14 weeks Date Initiated: 01/19/2017 Target Resolution Date: 04/02/2017 Goal Status: Active Interventions: Assess patient/caregiver ability to obtain necessary supplies Assess patient/caregiver ability to perform ulcer/skin care regimen upon admission and as needed Assess ulceration(s) every visit Notes: Electronic Signature(s) Signed: 03/16/2017 5:01:03 PM By: Alric Quan Entered By: Alric Quan on 03/16/2017 15:48:51 [name] ([id number]) -------------------------------------------------------------------------------- Pain Assessment Details Patient Name: [name]. Date of Service: 03/16/2017 3:15 PM Medical Record Patient Account Number: [id number] Number: Treating RN: Ahmed Prima 08/29/1935 (81 y.o. Other Clinician: Date of Birth/Sex: Female) Treating ROBSON, MICHAEL Primary Care Mindie Rawdon: Lamonte Sakai Latron Ribas/Extender: G Referring Eyanna Mcgonagle: Orland Mustard in Treatment: 8 Active Problems Location of Pain Severity and Description of Pain Patient Has Paino No Site Locations Pain Management and Medication Current Pain Management: Electronic Signature(s) Signed: 03/16/2017 5:01:03 PM By: Alric Quan Entered  By: Alric Quan on 03/16/2017 15:27:41 [name] ([id number]) -------------------------------------------------------------------------------- Patient/Caregiver Education Details Patient Name: [name]. Date of Service: 03/16/2017 3:15 PM Medical Record Patient Account Number: [id number] Number: Treating RN: Ahmed Prima [birth year] (81 y.o.  Other Clinician: Date of Birth/Gender: Female) Treating ROBSON, [place] Primary Care Physician: Lamonte Sakai Physician/Extender: G Referring Physician: Orland Mustard in Treatment: 8 Education Assessment Education Provided To: Patient Education Topics Provided Wound/Skin Impairment: Handouts: Other: change dressing as ordered Methods: Demonstration, Explain/Verbal Responses: State content correctly Electronic Signature(s) Signed: 03/16/2017 5:01:03 PM By: Alric Quan Entered By: Alric Quan on 03/16/2017 16:13:16 [name] ([id number]) -------------------------------------------------------------------------------- Wound Assessment Details Patient Name: [name]. Date of Service: 03/16/2017 3:15 PM Medical Record Patient Account Number: [id number] Number: Treating RN: Ahmed Prima December 21, 1935 (81 y.o. Other Clinician: Date of Birth/Sex: Female) Treating ROBSON, MICHAEL Primary Care Anael Rosch: Lamonte Sakai Brit Carbonell/Extender: G Referring Kais Monje: Orland Mustard in Treatment: 8 Wound Status Wound Number: 1 Primary Trauma, Other Etiology: Wound Location: Left Lower Leg - Anterior Wound Status: Open Wounding Event: Trauma Comorbid Received Chemotherapy, Received Date Acquired: 01/07/2017 History: Radiation Weeks Of Treatment: 8 Clustered Wound: No Photos Photo Uploaded By: Alric Quan on 03/16/2017 16:44:02 Wound Measurements Length: (cm) 0.5 Width: (cm) 0.5 Depth: (cm) 0.1 Area: (cm) 0.196 Volume: (cm) 0.02 % Reduction in Area: 99.9% % Reduction in Volume: 100% Epithelialization: Small (1-33%) Tunneling: No Undermining: No Wound Description Full Thickness Without Exposed Classification: Support Structures Wound Margin: Indistinct, nonvisible Exudate Large Amount: Exudate Type: Sanguinous Exudate Color: red Foul Odor After Cleansing: No Slough/Fibrino Yes Wound Bed Granulation Amount: Large (67-100%)  Exposed Structure [name], [name] ([id number]) Granulation Quality: Red, Hyper-granulation Fascia Exposed: No Necrotic Amount: Small (1-33%) Fat Layer (Subcutaneous Tissue) Exposed: Yes Necrotic Quality: Adherent Slough Tendon Exposed: No Muscle Exposed: No Joint Exposed: No Bone Exposed: No Periwound Skin Texture Texture Color No Abnormalities Noted: No No Abnormalities Noted: No Callus: No Atrophie Blanche: No Crepitus: No Cyanosis: No Excoriation: Yes Ecchymosis: Yes Induration: No Erythema: No Rash: No Hemosiderin Staining: No Scarring: No Mottled: No Pallor: No Moisture Rubor: No No Abnormalities Noted: No Dry / Scaly: No Temperature / Pain Maceration: No Temperature: No Abnormality Tenderness on Palpation: Yes Wound Preparation Ulcer Cleansing: Rinsed/Irrigated with Saline Topical Anesthetic Applied: Other: lidocaine 4%, Treatment Notes Wound #1 (Left, Anterior Lower Leg) 1. Cleansed with: Clean wound with Normal Saline 2. Anesthetic Topical Lidocaine 4% cream to wound bed prior to debridement 4. Dressing Applied: Hydrafera Blue Contact layer 5. Secondary Dressing Applied ABD Pad Kerlix/Conform 7. Secured with Tape Notes Mepitel One, netting Electronic Signature(s) Signed: 03/16/2017 5:01:03 PM By: Alric Quan Entered By: Alric Quan on 03/16/2017 15:48:04 [name] ([id number]) [name] ([id number]) -------------------------------------------------------------------------------- Vitals Details Patient Name: [name]. Date of Service: 03/16/2017 3:15 PM Medical Record Patient Account Number: [id number] Number: Treating RN: Ahmed Prima 12-07-1935 (81 y.o. Other Clinician: Date of Birth/Sex: Female) Treating ROBSON, MICHAEL Primary Care Clever Geraldo: Lamonte Sakai Niemah Schwebke/Extender: G Referring Shawonda Kerce: Orland Mustard in Treatment: 8 Vital Signs Time Taken: 15:28 Temperature (F): 98.0 Height  (in): 68 Pulse (bpm): 66 Weight (lbs): 169 Respiratory Rate (breaths/min): 18 Body Mass Index (BMI): 25.7 Blood Pressure (mmHg): 154/70 Reference Range: 80 - 120 mg / dl Electronic Signature(s) Signed: 03/16/2017 5:01:03 PM By: Alric Quan Entered By: Alric Quan on 03/16/2017 15:29:03
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
## 2017-03-23 ENCOUNTER — Encounter: Payer: Medicare Other | Admitting: Internal Medicine

## 2017-03-23 DIAGNOSIS — I87322 Chronic venous hypertension (idiopathic) with inflammation of left lower extremity: Secondary | ICD-10-CM | POA: Diagnosis not present
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
## 2017-03-26 NOTE — Progress Notes (Signed)
[name], [name] ([id number]) Visit Report for 03/23/2017 Arrival Information Details Patient Name: [name], [name]. Date of Service: 03/23/2017 9:15 AM Medical Record Number: [id number] Patient Account Number: [id number] Date of Birth/Sex: 12/06/1935 (81 y.o. Female) Treating RN: Cornell Barman Primary Care Carmella Kees: Lamonte Sakai Other Clinician: Referring Bernard Donahoo: Lamonte Sakai Treating Daune Colgate/Extender: Tito Dine in Treatment: 9 Visit Information History Since Last Visit Added or deleted any medications: Yes Patient Arrived: Ambulatory Any new allergies or adverse reactions: No Arrival Time: 09:14 Had a fall or experienced change in No Accompanied By: self activities of daily living that may affect Transfer Assistance: None risk of falls: Patient Identification Verified: Yes Signs or symptoms of abuse/neglect since last visito No Secondary Verification Process Completed: Yes Hospitalized since last visit: No Patient Requires Transmission-Based No Has Dressing in Place as Prescribed: Yes Precautions: Pain Present Now: No Patient Has Alerts: No Electronic Signature(s) Signed: 03/24/2017 5:14:29 PM By: Gretta Cool, BSN, RN, CWS, Kim RN, BSN Entered By: Gretta Cool, BSN, RN, CWS, Kim on 03/23/2017 09:15:27 [name] ([id number]) -------------------------------------------------------------------------------- Clinic Level of Care Assessment Details Patient Name: [name]. Date of Service: 03/23/2017 9:15 AM Medical Record Number: [id number] Patient Account Number: [id number] Date of Birth/Sex: 1936-05-11 (81 y.o. Female) Treating RN: Cornell Barman Primary Care Jaquarious Grey: Lamonte Sakai Other Clinician: Referring Azlaan Isidore: Lamonte Sakai Treating Delano Scardino/Extender: Tito Dine in Treatment: 9 Clinic Level of Care Assessment Items TOOL 4 Quantity Score []  - Use when only an EandM is performed on FOLLOW-UP visit 0 ASSESSMENTS - Nursing Assessment /  Reassessment []  - Reassessment of Co-morbidities (includes updates in patient status) 0 X- 1 5 Reassessment of Adherence to Treatment Plan ASSESSMENTS - Wound and Skin Assessment / Reassessment X - Simple Wound Assessment / Reassessment - one wound 1 5 []  - 0 Complex Wound Assessment / Reassessment - multiple wounds []  - 0 Dermatologic / Skin Assessment (not related to wound area) ASSESSMENTS - Focused Assessment []  - Circumferential Edema Measurements - multi extremities 0 []  - 0 Nutritional Assessment / Counseling / Intervention []  - 0 Lower Extremity Assessment (monofilament, tuning fork, pulses) []  - 0 Peripheral Arterial Disease Assessment (using hand held doppler) ASSESSMENTS - Ostomy and/or Continence Assessment and Care []  - Incontinence Assessment and Management 0 []  - 0 Ostomy Care Assessment and Management (repouching, etc.) PROCESS - Coordination of Care X - Simple Patient / Family Education for ongoing care 1 15 []  - 0 Complex (extensive) Patient / Family Education for ongoing care []  - 0 Staff obtains Programmer, systems, Records, Test Results / Process Orders []  - 0 Staff telephones HHA, Nursing Homes / Clarify orders / etc []  - 0 Routine Transfer to another Facility (non-emergent condition) []  - 0 Routine Hospital Admission (non-emergent condition) []  - 0 New Admissions / Biomedical engineer / Ordering NPWT, Apligraf, etc. []  - 0 Emergency Hospital Admission (emergent condition) X- 1 10 Simple Discharge Coordination [name], [name] ([id number]) []  - 0 Complex (extensive) Discharge Coordination PROCESS - Special Needs []  - Pediatric / Minor Patient Management 0 []  - 0 Isolation Patient Management []  - 0 Hearing / Language / Visual special needs []  - 0 Assessment of Community assistance (transportation, D/C planning, etc.) []  - 0 Additional assistance / Altered mentation []  - 0 Support Surface(s) Assessment (bed, cushion, seat, etc.) INTERVENTIONS -  Wound Cleansing / Measurement X - Simple Wound Cleansing - one wound 1 5 []  - 0 Complex Wound Cleansing - multiple wounds X- 1 5 Wound Imaging (photographs - any  number of wounds) []  - 0 Wound Tracing (instead of photographs) X- 1 5 Simple Wound Measurement - one wound []  - 0 Complex Wound Measurement - multiple wounds INTERVENTIONS - Wound Dressings []  - Small Wound Dressing one or multiple wounds 0 X- 1 15 Medium Wound Dressing one or multiple wounds []  - 0 Large Wound Dressing one or multiple wounds []  - 0 Application of Medications - topical []  - 0 Application of Medications - injection INTERVENTIONS - Miscellaneous []  - External ear exam 0 []  - 0 Specimen Collection (cultures, biopsies, blood, body fluids, etc.) []  - 0 Specimen(s) / Culture(s) sent or taken to Lab for analysis []  - 0 Patient Transfer (multiple staff / Civil Service fast streamer / Similar devices) []  - 0 Simple Staple / Suture removal (25 or less) []  - 0 Complex Staple / Suture removal (26 or more) []  - 0 Hypo / Hyperglycemic Management (close monitor of Blood Glucose) []  - 0 Ankle / Brachial Index (ABI) - do not check if billed separately X- 1 5 Vital Signs [name], [name] ([id number]) Has the patient been seen at the hospital within the last three years: Yes Total Score: 70 Level Of Care: New/Established - Level 2 Electronic Signature(s) Signed: 03/24/2017 5:14:29 PM By: Gretta Cool, BSN, RN, CWS, Kim RN, BSN Entered By: Gretta Cool, BSN, RN, CWS, Kim on 03/23/2017 09:44:15 [name] ([id number]) -------------------------------------------------------------------------------- Encounter Discharge Information Details Patient Name: [name]. Date of Service: 03/23/2017 9:15 AM Medical Record Number: [id number] Patient Account Number: [id number] Date of Birth/Sex: [birth year] (81 y.o. Female) Treating RN: Cornell Barman Primary Care Eniola Cerullo: Lamonte Sakai Other Clinician: Referring Bobbi Yount: Lamonte Sakai Treating  Janita Camberos/Extender: Tito Dine in Treatment: 9 Encounter Discharge Information Items Discharge Pain Level: 0 Discharge Condition: Stable Ambulatory Status: Ambulatory Discharge Destination: Home Private Transportation: Auto Accompanied By: self Schedule Follow-up Appointment: Yes Medication Reconciliation completed and provided Yes to Patient/Care Martavius Lusty: Clinical Summary of Care: Electronic Signature(s) Signed: 03/24/2017 5:14:29 PM By: Gretta Cool, BSN, RN, CWS, Kim RN, BSN Entered By: Gretta Cool, BSN, RN, CWS, Kim on 03/23/2017 09:45:53 [name] ([id number]) -------------------------------------------------------------------------------- Lower Extremity Assessment Details Patient Name: [name]. Date of Service: 03/23/2017 9:15 AM Medical Record Number: [id number] Patient Account Number: [id number] Date of Birth/Sex: February 22, 1936 (81 y.o. Female) Treating RN: Cornell Barman Primary Care Eleen Litz: Lamonte Sakai Other Clinician: Referring Colbin Jovel: Lamonte Sakai Treating Jailene Cupit/Extender: Tito Dine in Treatment: 9 Vascular Assessment Pulses: Dorsalis Pedis Palpable: [Left:Yes] Posterior Tibial Extremity colors, hair growth, and conditions: Extremity Color: [Left:Mottled] Hair Growth on Extremity: [Left:Yes] Temperature of Extremity: [Left:Warm] Capillary Refill: [Left:< 3 seconds] Toe Nail Assessment Left: Right: Thick: No Discolored: No Deformed: No Improper Length and Hygiene: No Electronic Signature(s) Signed: 03/24/2017 5:14:29 PM By: Gretta Cool, BSN, RN, CWS, Kim RN, BSN Entered By: Gretta Cool, BSN, RN, CWS, Kim on 03/23/2017 09:19:59 [name] ([id number]) -------------------------------------------------------------------------------- Multi Wound Chart Details Patient Name: [name]. Date of Service: 03/23/2017 9:15 AM Medical Record Number: [id number] Patient Account Number: [id number] Date of Birth/Sex: August 03, 1935 (81 y.o.  Female) Treating RN: Cornell Barman Primary Care Tania Perrott: Lamonte Sakai Other Clinician: Referring Kristene Liberati: Lamonte Sakai Treating Loyal Holzheimer/Extender: Tito Dine in Treatment: 9 Vital Signs Height(in): 68 Pulse(bpm): 73 Weight(lbs): 169 Blood Pressure(mmHg): 147/72 Body Mass Index(BMI): 26 Temperature(F): 97.9 Respiratory Rate 16 (breaths/min): Photos: [N/A:N/A] Wound Location: Left Lower Leg - Anterior N/A N/A Wounding Event: Trauma N/A N/A Primary Etiology: Trauma, Other N/A N/A Comorbid History: Received Chemotherapy, N/A N/A Received Radiation Date Acquired: 01/07/2017  N/A N/A Weeks of Treatment: 9 N/A N/A Wound Status: Open N/A N/A Measurements L x W x D 0.3x0.3x0.1 N/A N/A (cm) Area (cm) : 0.071 N/A N/A Volume (cm) : 0.007 N/A N/A % Reduction in Area: 99.90% N/A N/A % Reduction in Volume: 100.00% N/A N/A Classification: Full Thickness Without N/A N/A Exposed Support Structures Exudate Amount: Large N/A N/A Exudate Type: Sanguinous N/A N/A Exudate Color: red N/A N/A Wound Margin: Indistinct, nonvisible N/A N/A Granulation Amount: Large (67-100%) N/A N/A Granulation Quality: Red N/A N/A Necrotic Amount: None Present (0%) N/A N/A Exposed Structures: Fascia: No N/A N/A Fat Layer (Subcutaneous Tissue) Exposed: No Tendon: No Muscle: No Joint: No Bone: No Epithelialization: Medium (34-66%) N/A N/A [name], [name] ([id number]) Periwound Skin Texture: Induration: Yes N/A N/A Excoriation: No Callus: No Crepitus: No Rash: No Scarring: No Periwound Skin Moisture: Maceration: No N/A N/A Dry/Scaly: No Periwound Skin Color: Ecchymosis: Yes N/A N/A Atrophie Blanche: No Cyanosis: No Erythema: No Hemosiderin Staining: No Mottled: No Pallor: No Rubor: No Temperature: No Abnormality N/A N/A Tenderness on Palpation: Yes N/A N/A Wound Preparation: Ulcer Cleansing: N/A N/A Rinsed/Irrigated with Saline Topical Anesthetic Applied: Other: lidocaine  4% Treatment Notes Wound #1 (Left, Anterior Lower Leg) 1. Cleansed with: Clean wound with Normal Saline 2. Anesthetic Topical Lidocaine 4% cream to wound bed prior to debridement 4. Dressing Applied: Hydrafera Blue Notes Mepitel One, coverlet, netting Electronic Signature(s) Signed: 03/23/2017 4:56:05 PM By: Linton Ham MD Entered By: Linton Ham on 03/23/2017 09:50:08 [name] ([id number]) -------------------------------------------------------------------------------- [place] Details Patient Name: [name]. Date of Service: 03/23/2017 9:15 AM Medical Record Number: [id number] Patient Account Number: [id number] Date of Birth/Sex: June 16, 1935 (81 y.o. Female) Treating RN: Cornell Barman Primary Care Deeanna Beightol: Lamonte Sakai Other Clinician: Referring Stevan Eberwein: Lamonte Sakai Treating Liboria Putnam/Extender: Tito Dine in Treatment: 9 Active Inactive ` Abuse / Safety / Falls / Self Care Management Nursing Diagnoses: Potential for falls Goals: Patient will remain injury free related to falls Date Initiated: 01/19/2017 Target Resolution Date: 03/05/2017 Goal Status: Active Interventions: Assess fall risk on admission and as needed Notes: ` Orientation to the Wound Care Program Nursing Diagnoses: Knowledge deficit related to the wound healing center program Goals: Patient/caregiver will verbalize understanding of the [place] Program Date Initiated: 01/19/2017 Target Resolution Date: 03/05/2017 Goal Status: Active Interventions: Provide education on orientation to the wound center Notes: ` Pain, Acute or Chronic Nursing Diagnoses: Pain, acute or chronic: actual or potential Goals: Patient/caregiver will verbalize adequate pain control between visits Date Initiated: 01/19/2017 Target Resolution Date: 03/05/2017 Goal Status: Active Interventions: [name], [name] ([id number]) Assess comfort goal upon  admission Notes: ` Wound/Skin Impairment Nursing Diagnoses: Impaired tissue integrity Goals: Ulcer/skin breakdown will heal within 14 weeks Date Initiated: 01/19/2017 Target Resolution Date: 04/02/2017 Goal Status: Active Interventions: Assess patient/caregiver ability to obtain necessary supplies Assess patient/caregiver ability to perform ulcer/skin care regimen upon admission and as needed Assess ulceration(s) every visit Notes: Electronic Signature(s) Signed: 03/24/2017 5:14:29 PM By: Gretta Cool, BSN, RN, CWS, Kim RN, BSN Entered By: Gretta Cool, BSN, RN, CWS, Kim on 03/23/2017 09:20:04 [name] ([id number]) -------------------------------------------------------------------------------- Pain Assessment Details Patient Name: [name]. Date of Service: 03/23/2017 9:15 AM Medical Record Number: [id number] Patient Account Number: [id number] Date of Birth/Sex: 1936-01-21 (81 y.o. Female) Treating RN: Cornell Barman Primary Care Labarron Durnin: Lamonte Sakai Other Clinician: Referring Hershell Brandl: Lamonte Sakai Treating Shanesha Bednarz/Extender: Tito Dine in Treatment: 9 Active Problems Location of Pain Severity and Description of Pain  Patient Has Paino No Site Locations With Dressing Change: No Pain Management and Medication Current Pain Management: Goals for Pain Management Topical or injectable lidocaine is offered to patient for acute pain when surgical debridement is performed. If needed, Patient is instructed to use over the counter pain medication for the following 24-48 hours after debridement. Wound care MDs do not prescribed pain medications. Patient has chronic pain or uncontrolled pain. Patient has been instructed to make an appointment with their Primary Care Physician for pain management. Electronic Signature(s) Signed: 03/24/2017 5:14:29 PM By: Gretta Cool, BSN, RN, CWS, Kim RN, BSN Entered By: Gretta Cool, BSN, RN, CWS, Kim on 03/23/2017 09:15:36 [name]  ([id number]) -------------------------------------------------------------------------------- Patient/Caregiver Education Details Patient Name: [name]. Date of Service: 03/23/2017 9:15 AM Medical Record Number: [id number] Patient Account Number: [id number] Date of Birth/Gender: 1936-04-15 (81 y.o. Female) Treating RN: Cornell Barman Primary Care Physician: Lamonte Sakai Other Clinician: Referring Physician: Lamonte Sakai Treating Physician/Extender: Tito Dine in Treatment: 9 Education Assessment Education Provided To: Patient Education Topics Provided Wound/Skin Impairment: Handouts: Caring for Your Ulcer Methods: Demonstration, Explain/Verbal Responses: State content correctly Electronic Signature(s) Signed: 03/24/2017 5:14:29 PM By: Gretta Cool, BSN, RN, CWS, Kim RN, BSN Entered By: Gretta Cool, BSN, RN, CWS, Kim on 03/23/2017 09:46:05 [name] ([id number]) -------------------------------------------------------------------------------- Wound Assessment Details Patient Name: [name]. Date of Service: 03/23/2017 9:15 AM Medical Record Number: [id number] Patient Account Number: [id number] Date of Birth/Sex: 1935-10-25 (81 y.o. Female) Treating RN: Cornell Barman Primary Care Asahd Can: Lamonte Sakai Other Clinician: Referring Regnia Mathwig: Lamonte Sakai Treating Kariah Loredo/Extender: Tito Dine in Treatment: 9 Wound Status Wound Number: 1 Primary Etiology: Trauma, Other Wound Location: Left Lower Leg - Anterior Wound Status: Open Wounding Event: Trauma Comorbid Received Chemotherapy, Received History: Radiation Date Acquired: 01/07/2017 Weeks Of Treatment: 9 Clustered Wound: No Photos Wound Measurements Length: (cm) 0.3 Width: (cm) 0.3 Depth: (cm) 0.1 Area: (cm) 0.071 Volume: (cm) 0.007 % Reduction in Area: 99.9% % Reduction in Volume: 100% Epithelialization: Medium (34-66%) Tunneling: No Undermining: No Wound Description Full Thickness  Without Exposed Support Foul Odo Classification: Structures Slough/F Wound Margin: Indistinct, nonvisible Exudate Large Amount: Exudate Type: Sanguinous Exudate Color: red r After Cleansing: No ibrino Yes Wound Bed Granulation Amount: Large (67-100%) Exposed Structure Granulation Quality: Red Fascia Exposed: No Necrotic Amount: None Present (0%) Fat Layer (Subcutaneous Tissue) Exposed: No Tendon Exposed: No Muscle Exposed: No Joint Exposed: No Bone Exposed: No Periwound Skin Texture Texture Color No Abnormalities Noted: No No Abnormalities Noted: No [name], [name] ([id number]) Callus: No Atrophie Blanche: No Crepitus: No Cyanosis: No Excoriation: No Ecchymosis: Yes Induration: Yes Erythema: No Rash: No Hemosiderin Staining: No Scarring: No Mottled: No Pallor: No Moisture Rubor: No No Abnormalities Noted: No Dry / Scaly: No Temperature / Pain Maceration: No Temperature: No Abnormality Tenderness on Palpation: Yes Wound Preparation Ulcer Cleansing: Rinsed/Irrigated with Saline Topical Anesthetic Applied: Other: lidocaine 4%, Treatment Notes Wound #1 (Left, Anterior Lower Leg) 1. Cleansed with: Clean wound with Normal Saline 2. Anesthetic Topical Lidocaine 4% cream to wound bed prior to debridement 4. Dressing Applied: Hydrafera Blue Notes Mepitel One, coverlet, netting Electronic Signature(s) Signed: 03/24/2017 5:14:29 PM By: Gretta Cool, BSN, RN, CWS, Kim RN, BSN Entered By: Gretta Cool, BSN, RN, CWS, Kim on 03/23/2017 09:19:05 [name] ([id number]) -------------------------------------------------------------------------------- Vitals Details Patient Name: [name]. Date of Service: 03/23/2017 9:15 AM Medical Record Number: [id number] Patient Account Number: [id number] Date of Birth/Sex: [birth year] (81 y.o. Female) Treating RN: Cornell Barman Primary Care Mahlon Gabrielle:  Lamonte Sakai Other Clinician: Referring Ossiel Marchio: Lamonte Sakai Treating  Julio Zappia/Extender: Tito Dine in Treatment: 9 Vital Signs Time Taken: 09:15 Temperature (F): 97.9 Height (in): 68 Pulse (bpm): 68 Weight (lbs): 169 Respiratory Rate (breaths/min): 16 Body Mass Index (BMI): 25.7 Blood Pressure (mmHg): 147/72 Reference Range: 80 - 120 mg / dl Electronic Signature(s) Signed: 03/24/2017 5:14:29 PM By: Gretta Cool, BSN, RN, CWS, Kim RN, BSN Entered By: Gretta Cool, BSN, RN, CWS, Kim on 03/23/2017 09:16:13

## 2017-03-30 ENCOUNTER — Encounter: Payer: Medicare Other | Admitting: Internal Medicine

## 2017-03-30 ENCOUNTER — Other Ambulatory Visit: Payer: Self-pay | Admitting: Internal Medicine

## 2017-03-30 DIAGNOSIS — I87322 Chronic venous hypertension (idiopathic) with inflammation of left lower extremity: Secondary | ICD-10-CM | POA: Diagnosis not present

## 2017-03-31 LAB — SURGICAL PATHOLOGY

## 2017-04-02 LAB — PATHOLOGY

## 2017-04-02 NOTE — Progress Notes (Signed)
YEIMI, DEBNAM ([id number]) Visit Report for 03/30/2017 Arrival Information Details Patient Name: [name], [name]. Date of Service: 03/30/2017 1:30 PM Medical Record Number: [id number] Patient Account Number: [id number] Date of Birth/Sex: [birth year] (81 y.o. Female) Treating RN: Carolyne Fiscal, Debi Primary Care Caeleigh Prohaska: Lamonte Sakai Other Clinician: Referring Dewey Viens: Lamonte Sakai Treating Jerred Zaremba/Extender: Tito Dine in Treatment: 10 Visit Information History Since Last Visit All ordered tests and consults were completed: No Patient Arrived: Ambulatory Added or deleted any medications: No Arrival Time: 13:13 Any new allergies or adverse reactions: No Accompanied By: self Had a fall or experienced change in No Transfer Assistance: None activities of daily living that may affect Patient Identification Verified: Yes risk of falls: Secondary Verification Process Completed: Yes Signs or symptoms of abuse/neglect since last visito No Patient Requires Transmission-Based No Hospitalized since last visit: No Precautions: Has Dressing in Place as Prescribed: Yes Patient Has Alerts: No Pain Present Now: No Electronic Signature(s) Signed: 03/31/2017 4:35:14 PM By: Alric Quan Entered By: Alric Quan on 03/30/2017 13:14:20 [name] ([id number]) -------------------------------------------------------------------------------- Encounter Discharge Information Details Patient Name: [name]. Date of Service: 03/30/2017 1:30 PM Medical Record Number: [id number] Patient Account Number: [id number] Date of Birth/Sex: [birth year] (81 y.o. Female) Treating RN: Ahmed Prima Primary Care Tylek Boney: Lamonte Sakai Other Clinician: Referring Lonney Revak: Lamonte Sakai Treating Saket Hellstrom/Extender: Tito Dine in Treatment: 10 Encounter Discharge Information Items Discharge Pain Level: 0 Discharge Condition: Stable Ambulatory Status: Ambulatory Discharge  Destination: Home Transportation: Private Auto Accompanied By: self Schedule Follow-up Appointment: Yes Medication Reconciliation completed and No provided to Patient/Care Andrick Rust: Provided on Clinical Summary of Care: 03/30/2017 Form Type Recipient Paper Patient JG Electronic Signature(s) Signed: 03/30/2017 4:17:00 PM By: Alric Quan Entered By: Alric Quan on 03/30/2017 16:17:00 [name] ([id number]) -------------------------------------------------------------------------------- Lower Extremity Assessment Details Patient Name: [name]. Date of Service: 03/30/2017 1:30 PM Medical Record Number: [id number] Patient Account Number: [id number] Date of Birth/Sex: 1936/01/20 (81 y.o. Female) Treating RN: Carolyne Fiscal, Debi Primary Care Paden Kuras: Lamonte Sakai Other Clinician: Referring Nicandro Perrault: Lamonte Sakai Treating Jina Olenick/Extender: Tito Dine in Treatment: 10 Vascular Assessment Pulses: Dorsalis Pedis Palpable: [Left:Yes] Posterior Tibial Extremity colors, hair growth, and conditions: Extremity Color: [Left:Mottled] Temperature of Extremity: [Left:Warm] Capillary Refill: [Left:< 3 seconds] Toe Nail Assessment Left: Right: Thick: No Discolored: No Deformed: No Improper Length and Hygiene: No Electronic Signature(s) Signed: 03/31/2017 4:35:14 PM By: Alric Quan Entered By: Alric Quan on 03/30/2017 13:36:36 [name] ([id number]) -------------------------------------------------------------------------------- Multi Wound Chart Details Patient Name: [name]. Date of Service: 03/30/2017 1:30 PM Medical Record Number: [id number] Patient Account Number: [id number] Date of Birth/Sex: [birth year] (81 y.o. Female) Treating RN: Carolyne Fiscal, Debi Primary Care Caliah Kopke: Lamonte Sakai Other Clinician: Referring Elise Gladden: Lamonte Sakai Treating Ryian Lynde/Extender: Tito Dine in Treatment: 10 Vital  Signs Height(in): 68 Pulse(bpm): 70 Weight(lbs): 169 Blood Pressure(mmHg): 161/68 Body Mass Index(BMI): 26 Temperature(F): 97.9 Respiratory Rate 18 (breaths/min): Photos: [1:No Photos] [N/A:N/A] Wound Location: [1:Left Lower Leg - Anterior] [N/A:N/A] Wounding Event: [1:Trauma] [N/A:N/A] Primary Etiology: [1:Trauma, Other] [N/A:N/A] Comorbid History: [1:Received Chemotherapy, Received Radiation] [N/A:N/A] Date Acquired: [1:01/07/2017] [N/A:N/A] Weeks of Treatment: [1:10] [N/A:N/A] Wound Status: [1:Open] [N/A:N/A] Measurements L x W x Hopkins [1:0.3x0.2x0.1] [N/A:N/A] (cm) Area (cm) : [1:0.047] [N/A:N/A] Volume (cm) : [1:0.005] [N/A:N/A] % Reduction in Area: [1:100.00%] [N/A:N/A] % Reduction in Volume: [1:100.00%] [N/A:N/A] Classification: [1:Full Thickness Without Exposed Support Structures] [N/A:N/A] Exudate Amount: [1:Large] [N/A:N/A] Exudate Type: [1:Sanguinous] [N/A:N/A] Exudate Color: [1:red] [N/A:N/A] Wound Margin: [1:Indistinct, nonvisible] [N/A:N/A] Granulation Amount: [  1:Large (67-100%)] [N/A:N/A] Granulation Quality: [1:Red] [N/A:N/A] Necrotic Amount: [1:Small (1-33%)] [N/A:N/A] Necrotic Tissue: [1:Eschar] [N/A:N/A] Exposed Structures: [1:Fascia: No Fat Layer (Subcutaneous Tissue) Exposed: No Tendon: No Muscle: No Joint: No Bone: No] [N/A:N/A] Epithelialization: [1:Medium (34-66%)] [N/A:N/A] Periwound Skin Texture: [1:Induration: Yes Excoriation: No Callus: No Crepitus: No] [N/A:N/A] Rash: No Scarring: No Periwound Skin Moisture: Maceration: No N/A N/A Dry/Scaly: No Periwound Skin Color: Ecchymosis: Yes N/A N/A Atrophie Blanche: No Cyanosis: No Erythema: No Hemosiderin Staining: No Mottled: No Pallor: No Rubor: No Temperature: No Abnormality N/A N/A Tenderness on Palpation: Yes N/A N/A Wound Preparation: Ulcer Cleansing: N/A N/A Rinsed/Irrigated with Saline Topical Anesthetic Applied: Other: lidocaine 4% Procedures Performed: Biopsy N/A N/A Treatment  Notes Electronic Signature(s) Signed: 03/30/2017 4:48:36 PM By: Linton Ham MD Entered By: Linton Ham on 03/30/2017 15:41:03 [name] ([id number]) -------------------------------------------------------------------------------- [place] Details Patient Name: [name]. Date of Service: 03/30/2017 1:30 PM Medical Record Number: [id number] Patient Account Number: [id number] Date of Birth/Sex: June 15, 1935 (81 y.o. Female) Treating RN: Carolyne Fiscal, Debi Primary Care Anjelo Pullman: Lamonte Sakai Other Clinician: Referring Naila Elizondo: Lamonte Sakai Treating Rosabell Geyer/Extender: Tito Dine in Treatment: 10 Active Inactive ` Abuse / Safety / Falls / Self Care Management Nursing Diagnoses: Potential for falls Goals: Patient will remain injury free related to falls Date Initiated: 01/19/2017 Target Resolution Date: 03/05/2017 Goal Status: Active Interventions: Assess fall risk on admission and as needed Notes: ` Orientation to the Wound Care Program Nursing Diagnoses: Knowledge deficit related to the wound healing center program Goals: Patient/caregiver will verbalize understanding of the [facility] Date Initiated: 01/19/2017 Target Resolution Date: 03/05/2017 Goal Status: Active Interventions: Provide education on orientation to the wound center Notes: ` Pain, Acute or Chronic Nursing Diagnoses: Pain, acute or chronic: actual or potential Goals: Patient/caregiver will verbalize adequate pain control between visits Date Initiated: 01/19/2017 Target Resolution Date: 03/05/2017 Goal Status: Active Interventions: [name], [name] ([id number]) Assess comfort goal upon admission Notes: ` Wound/Skin Impairment Nursing Diagnoses: Impaired tissue integrity Goals: Ulcer/skin breakdown will heal within 14 weeks Date Initiated: 01/19/2017 Target Resolution Date: 04/02/2017 Goal Status: Active Interventions: Assess  patient/caregiver ability to obtain necessary supplies Assess patient/caregiver ability to perform ulcer/skin care regimen upon admission and as needed Assess ulceration(s) every visit Notes: Electronic Signature(s) Signed: 03/31/2017 4:35:14 PM By: Alric Quan Entered By: Alric Quan on 03/30/2017 13:36:50 [name] ([id number]) -------------------------------------------------------------------------------- Pain Assessment Details Patient Name: [name]. Date of Service: 03/30/2017 1:30 PM Medical Record Number: [id number] Patient Account Number: [id number] Date of Birth/Sex: March 12, 1936 (81 y.o. Female) Treating RN: Ahmed Prima Primary Care Asami Lambright: Lamonte Sakai Other Clinician: Referring Jamesyn [place]: Lamonte Sakai Treating Wyley Hack/Extender: Tito Dine in Treatment: 10 Active Problems Location of Pain Severity and Description of Pain Patient Has Paino No Site Locations Pain Management and Medication Current Pain Management: Electronic Signature(s) Signed: 03/31/2017 4:35:14 PM By: Alric Quan Entered By: Alric Quan on 03/30/2017 13:14:55 [name] ([id number]) -------------------------------------------------------------------------------- Patient/Caregiver Education Details Patient Name: [name]. Date of Service: 03/30/2017 1:30 PM Medical Record Number: [id number] Patient Account Number: [id number] Date of Birth/Gender: [birth year] (81 y.o. Female) Treating RN: Ahmed Prima Primary Care Physician: Lamonte Sakai Other Clinician: Referring Physician: Lamonte Sakai Treating Physician/Extender: Tito Dine in Treatment: 10 Education Assessment Education Provided To: Patient Education Topics Provided Wound/Skin Impairment: Handouts: Other: change dressing as ordered Methods: Demonstration, Explain/Verbal Responses: State content correctly Electronic Signature(s) Signed: 03/31/2017 4:35:14 PM By:  Alric Quan Entered By: Alric Quan on 03/30/2017  [place] ([id number]) -------------------------------------------------------------------------------- Wound Assessment Details Patient Name: [name], [name]. Date of Service: 03/30/2017 1:30 PM Medical Record Number: [id number] Patient Account Number: [id number] Date of Birth/Sex: [birth year] (81 y.o. Female) Treating RN: Carolyne Fiscal, Debi Primary Care Jaedynn Bohlken: Lamonte Sakai Other Clinician: Referring Rafaela Dinius: Lamonte Sakai Treating Isabela Nardelli/Extender: Tito Dine in Treatment: 10 Wound Status Wound Number: 1 Primary Etiology: Trauma, Other Wound Location: Left Lower Leg - Anterior Wound Status: Open Wounding Event: Trauma Comorbid Received Chemotherapy, Received History: Radiation Date Acquired: 01/07/2017 Weeks Of Treatment: 10 Clustered Wound: No Photos Photo Uploaded By: Alric Quan on 03/31/2017 16:08:41 Wound Measurements Length: (cm) 0.3 Width: (cm) 0.2 Depth: (cm) 0.1 Area: (cm) 0.047 Volume: (cm) 0.005 % Reduction in Area: 100% % Reduction in Volume: 100% Epithelialization: Medium (34-66%) Tunneling: No Undermining: No Wound Description Full Thickness Without Exposed Support Classification: Structures Wound Margin: Indistinct, nonvisible Exudate Large Amount: Exudate Type: Sanguinous Exudate Color: red Foul Odor After Cleansing: No Slough/Fibrino Yes Wound Bed Granulation Amount: Large (67-100%) Exposed Structure Granulation Quality: Red Fascia Exposed: No Necrotic Amount: Small (1-33%) Fat Layer (Subcutaneous Tissue) Exposed: No Necrotic Quality: Eschar Tendon Exposed: No Muscle Exposed: No Joint Exposed: No Bone Exposed: No [name], [name]. ([id number]) Periwound Skin Texture Texture Color No Abnormalities Noted: No No Abnormalities Noted: No Callus: No Atrophie Blanche: No Crepitus: No Cyanosis: No Excoriation: No Ecchymosis: Yes Induration:  Yes Erythema: No Rash: No Hemosiderin Staining: No Scarring: No Mottled: No Pallor: No Moisture Rubor: No No Abnormalities Noted: No Dry / Scaly: No Temperature / Pain Maceration: No Temperature: No Abnormality Tenderness on Palpation: Yes Wound Preparation Ulcer Cleansing: Rinsed/Irrigated with Saline Topical Anesthetic Applied: Other: lidocaine 4%, Treatment Notes Wound #1 (Left, Anterior Lower Leg) 1. Cleansed with: Clean wound with Normal Saline 2. Anesthetic Topical Lidocaine 4% cream to wound bed prior to debridement 2% Lidocaine injectible without epinephrine prior to debridement 4. Dressing Applied: Hydrafera Blue 5. Secondary Dressing Applied ABD Pad Kerlix/Conform 7. Secured with Tape Notes Mepitel One, coverlet, netting Electronic Signature(s) Signed: 03/31/2017 4:35:14 PM By: Alric Quan Entered By: Alric Quan on 03/30/2017 13:20:53 [name] ([id number]) -------------------------------------------------------------------------------- Vitals Details Patient Name: [name]. Date of Service: 03/30/2017 1:30 PM Medical Record Number: [id number] Patient Account Number: [id number] Date of Birth/Sex: September 24, 1935 (81 y.o. Female) Treating RN: Carolyne Fiscal, Debi Primary Care Ronneisha Jett: Lamonte Sakai Other Clinician: Referring Lexxie Winberg: Lamonte Sakai Treating Ann-Marie Kluge/Extender: Tito Dine in Treatment: 10 Vital Signs Time Taken: 13:19 Temperature (F): 97.9 Height (in): 68 Pulse (bpm): 70 Weight (lbs): 169 Respiratory Rate (breaths/min): 18 Body Mass Index (BMI): 25.7 Blood Pressure (mmHg): 161/68 Reference Range: 80 - 120 mg / dl Electronic Signature(s) Signed: 03/31/2017 4:35:14 PM By: Alric Quan Entered By: Alric Quan on 03/30/2017 13:20:04
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
## 2017-04-06 ENCOUNTER — Encounter: Payer: Medicare Other | Attending: Internal Medicine | Admitting: Internal Medicine

## 2017-04-06 DIAGNOSIS — X58XXXD Exposure to other specified factors, subsequent encounter: Secondary | ICD-10-CM | POA: Insufficient documentation

## 2017-04-06 DIAGNOSIS — S81812D Laceration without foreign body, left lower leg, subsequent encounter: Secondary | ICD-10-CM | POA: Diagnosis not present

## 2017-04-06 DIAGNOSIS — I87322 Chronic venous hypertension (idiopathic) with inflammation of left lower extremity: Secondary | ICD-10-CM | POA: Diagnosis not present
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
## 2017-04-11 NOTE — Progress Notes (Signed)
[name], [name] ([id number]) Visit Report for 04/06/2017 Arrival Information Details Patient Name: [name], [name]. Date of Service: 04/06/2017 3:15 PM Medical Record Number: [id number] Patient Account Number: [id number] Date of Birth/Sex: Sep 10, 1935 (80 y.o. Female) Treating RN: Carolyne Fiscal, Debi Primary Care Chenoah Mcnally: Lamonte Sakai Other Clinician: Referring Martisha Toulouse: Lamonte Sakai Treating Adalina Dopson/Extender: Tito Dine in Treatment: 11 Visit Information History Since Last Visit All ordered tests and consults were completed: No Patient Arrived: Ambulatory Added or deleted any medications: No Arrival Time: 15:16 Any new allergies or adverse reactions: No Accompanied By: SELF Had a fall or experienced change in No Transfer Assistance: None activities of daily living that may affect Patient Identification Verified: Yes risk of falls: Secondary Verification Process Completed: Yes Signs or symptoms of abuse/neglect since last visito No Patient Requires Transmission-Based No Hospitalized since last visit: No Precautions: Has Dressing in Place as Prescribed: Yes Patient Has Alerts: No Pain Present Now: No Electronic Signature(s) Signed: 04/09/2017 4:36:47 PM By: Alric Quan Entered By: Alric Quan on 04/06/2017 15:17:31 [name] ([id number]) -------------------------------------------------------------------------------- Clinic Level of Care Assessment Details Patient Name: [name]. Date of Service: 04/06/2017 3:15 PM Medical Record Number: [id number] Patient Account Number: [id number] Date of Birth/Sex: 12-04-1935 (81 y.o. Female) Treating RN: Carolyne Fiscal, Debi Primary Care Kalima Saylor: Lamonte Sakai Other Clinician: Referring Jeannia Tatro: Lamonte Sakai Treating Yareliz Thorstenson/Extender: Tito Dine in Treatment: 11 Clinic Level of Care Assessment Items TOOL 4 Quantity Score X - Use when only an EandM is performed on FOLLOW-UP visit 1 0 ASSESSMENTS -  Nursing Assessment / Reassessment X - Reassessment of Co-morbidities (includes updates in patient status) 1 10 X- 1 5 Reassessment of Adherence to Treatment Plan ASSESSMENTS - Wound and Skin Assessment / Reassessment X - Simple Wound Assessment / Reassessment - one wound 1 5 []  - 0 Complex Wound Assessment / Reassessment - multiple wounds []  - 0 Dermatologic / Skin Assessment (not related to wound area) ASSESSMENTS - Focused Assessment []  - Circumferential Edema Measurements - multi extremities 0 []  - 0 Nutritional Assessment / Counseling / Intervention []  - 0 Lower Extremity Assessment (monofilament, tuning fork, pulses) []  - 0 Peripheral Arterial Disease Assessment (using hand held doppler) ASSESSMENTS - Ostomy and/or Continence Assessment and Care []  - Incontinence Assessment and Management 0 []  - 0 Ostomy Care Assessment and Management (repouching, etc.) PROCESS - Coordination of Care X - Simple Patient / Family Education for ongoing care 1 15 []  - 0 Complex (extensive) Patient / Family Education for ongoing care []  - 0 Staff obtains Programmer, systems, Records, Test Results / Process Orders []  - 0 Staff telephones HHA, Nursing Homes / Clarify orders / etc []  - 0 Routine Transfer to another Facility (non-emergent condition) []  - 0 Routine Hospital Admission (non-emergent condition) []  - 0 New Admissions / Biomedical engineer / Ordering NPWT, Apligraf, etc. []  - 0 Emergency Hospital Admission (emergent condition) []  - 0 Simple Discharge Coordination [name], [name] ([id number]) []  - 0 Complex (extensive) Discharge Coordination PROCESS - Special Needs []  - Pediatric / Minor Patient Management 0 []  - 0 Isolation Patient Management []  - 0 Hearing / Language / Visual special needs []  - 0 Assessment of Community assistance (transportation, D/C planning, etc.) []  - 0 Additional assistance / Altered mentation []  - 0 Support Surface(s) Assessment (bed, cushion, seat,  etc.) INTERVENTIONS - Wound Cleansing / Measurement X - Simple Wound Cleansing - one wound 1 5 []  - 0 Complex Wound Cleansing - multiple wounds X- 1 5 Wound Imaging (photographs -  any number of wounds) []  - 0 Wound Tracing (instead of photographs) []  - 0 Simple Wound Measurement - one wound []  - 0 Complex Wound Measurement - multiple wounds INTERVENTIONS - Wound Dressings []  - Small Wound Dressing one or multiple wounds 0 []  - 0 Medium Wound Dressing one or multiple wounds []  - 0 Large Wound Dressing one or multiple wounds []  - 0 Application of Medications - topical []  - 0 Application of Medications - injection INTERVENTIONS - Miscellaneous []  - External ear exam 0 []  - 0 Specimen Collection (cultures, biopsies, blood, body fluids, etc.) []  - 0 Specimen(s) / Culture(s) sent or taken to Lab for analysis []  - 0 Patient Transfer (multiple staff / Civil Service fast streamer / Similar devices) []  - 0 Simple Staple / Suture removal (25 or less) []  - 0 Complex Staple / Suture removal (26 or more) []  - 0 Hypo / Hyperglycemic Management (close monitor of Blood Glucose) []  - 0 Ankle / Brachial Index (ABI) - do not check if billed separately X- 1 5 Vital Signs [name], [name] ([id number]) Has the patient been seen at the hospital within the last three years: Yes Total Score: 50 Level Of Care: New/Established - Level 2 Electronic Signature(s) Signed: 04/09/2017 4:36:47 PM By: Alric Quan Entered By: Alric Quan on 04/06/2017 16:19:01 [name] ([id number]) -------------------------------------------------------------------------------- Encounter Discharge Information Details Patient Name: [name]. Date of Service: 04/06/2017 3:15 PM Medical Record Number: [id number] Patient Account Number: [id number] Date of Birth/Sex: [birth year] (81 y.o. Female) Treating RN: Ahmed Prima Primary Care Daouda Lonzo: Lamonte Sakai Other Clinician: Referring Twinkle Sockwell: Lamonte Sakai Treating Arriel Victor/Extender: Tito Dine in Treatment: 11 Encounter Discharge Information Items Discharge Pain Level: 0 Discharge Condition: Stable Ambulatory Status: Ambulatory Discharge Destination: Home Transportation: Private Auto Accompanied By: self Schedule Follow-up Appointment: Yes Medication Reconciliation completed and No provided to Patient/Care Georgianna Band: Provided on Clinical Summary of Care: 04/06/2017 Form Type Recipient Paper Patient JG Electronic Signature(s) Signed: 04/08/2017 10:05:11 AM By: Ruthine Dose Entered By: Ruthine Dose on 04/06/2017 15:39:59 [name] ([id number]) -------------------------------------------------------------------------------- Lower Extremity Assessment Details Patient Name: [name]. Date of Service: 04/06/2017 3:15 PM Medical Record Number: [id number] Patient Account Number: [id number] Date of Birth/Sex: Jul 29, 1935 (81 y.o. Female) Treating RN: Carolyne Fiscal, Debi Primary Care Mariangela Heldt: Lamonte Sakai Other Clinician: Referring Laasya Peyton: Lamonte Sakai Treating Fatina Sprankle/Extender: Tito Dine in Treatment: 11 Vascular Assessment Pulses: Dorsalis Pedis Palpable: [Left:Yes] Posterior Tibial Extremity colors, hair growth, and conditions: Extremity Color: [Left:Mottled] Temperature of Extremity: [Left:Warm] Capillary Refill: [Left:< 3 seconds] Toe Nail Assessment Left: Right: Thick: No Discolored: No Deformed: No Improper Length and Hygiene: No Electronic Signature(s) Signed: 04/09/2017 4:36:47 PM By: Alric Quan Entered By: Alric Quan on 04/06/2017 15:32:45 [name] ([id number]) -------------------------------------------------------------------------------- Multi Wound Chart Details Patient Name: Summer Lovely D. Date of Service: 04/06/2017 3:15 PM Medical Record Number: [id number] Patient Account Number: [id number] Date of Birth/Sex: 02/04/1936 (81 y.o.  Female) Treating RN: Carolyne Fiscal, Debi Primary Care Vidur Knust: Lamonte Sakai Other Clinician: Referring Damani Rando: Lamonte Sakai Treating Sabatino Williard/Extender: Tito Dine in Treatment: 11 Vital Signs Height(in): 68 Pulse(bpm): 70 Weight(lbs): 169 Blood Pressure(mmHg): 150/57 Body Mass Index(BMI): 26 Temperature(F): 97.9 Respiratory Rate 18 (breaths/min): Photos: [1:No Photos] [N/A:N/A] Wound Location: [1:Left Lower Leg - Anterior] [N/A:N/A] Wounding Event: [1:Trauma] [N/A:N/A] Primary Etiology: [1:Trauma, Other] [N/A:N/A] Comorbid History: [1:Received Chemotherapy, Received Radiation] [N/A:N/A] Date Acquired: [1:01/07/2017] [N/A:N/A] Weeks of Treatment: [1:11] [N/A:N/A] Wound Status: [1:Open] [N/A:N/A] Measurements L x W x D [1:0x0x0] [N/A:N/A] (cm) Area (cm) : [  1:0] [N/A:N/A] Volume (cm) : [1:0] [N/A:N/A] % Reduction in Area: [1:100.00%] [N/A:N/A] % Reduction in Volume: [1:100.00%] [N/A:N/A] Classification: [1:Full Thickness Without Exposed Support Structures] [N/A:N/A] Exudate Amount: [1:None Present] [N/A:N/A] Wound Margin: [1:Indistinct, nonvisible] [N/A:N/A] Granulation Amount: [1:None Present (0%)] [N/A:N/A] Necrotic Amount: [1:None Present (0%)] [N/A:N/A] Exposed Structures: [1:Fascia: No Fat Layer (Subcutaneous Tissue) Exposed: No Tendon: No Muscle: No Joint: No Bone: No] [N/A:N/A] Epithelialization: [1:Large (67-100%)] [N/A:N/A] Periwound Skin Texture: [1:Scarring: Yes Excoriation: No Induration: No Callus: No Crepitus: No Rash: No] [N/A:N/A] Periwound Skin Moisture: [1:Maceration: No Dry/Scaly: No] [N/A:N/A] Periwound Skin Color: Ecchymosis: Yes N/A N/A Atrophie Blanche: No Cyanosis: No Erythema: No Hemosiderin Staining: No Mottled: No Pallor: No Rubor: No Temperature: No Abnormality N/A N/A Tenderness on Palpation: No N/A N/A Wound Preparation: Ulcer Cleansing: N/A N/A Rinsed/Irrigated with Saline Topical Anesthetic Applied: None Treatment  Notes Electronic Signature(s) Signed: 04/07/2017 8:07:20 AM By: Linton Ham MD Entered By: Linton Ham on 04/06/2017 16:00:24 [name] ([id number]) -------------------------------------------------------------------------------- Multi-Disciplinary Care Plan Details Patient Name: [name]. Date of Service: 04/06/2017 3:15 PM Medical Record Number: [id number] Patient Account Number: [id number] Date of Birth/Sex: 07/22/1935 (81 y.o. Female) Treating RN: Carolyne Fiscal, Debi Primary Care Naveen Lorusso: Lamonte Sakai Other Clinician: Referring Prajwal Fellner: Lamonte Sakai Treating Reyli Schroth/Extender: Tito Dine in Treatment: 11 Active Inactive Electronic Signature(s) Signed: 04/09/2017 4:36:47 PM By: Alric Quan Entered By: Alric Quan on 04/06/2017 15:33:16 [name] ([id number]) -------------------------------------------------------------------------------- Pain Assessment Details Patient Name: [name]. Date of Service: 04/06/2017 3:15 PM Medical Record Number: [id number] Patient Account Number: [id number] Date of Birth/Sex: [birth year] (81 y.o. Female) Treating RN: Ahmed Prima Primary Care Mackenzy Grumbine: Lamonte Sakai Other Clinician: Referring Levern Kalka: Lamonte Sakai Treating Meyer Arora/Extender: Tito Dine in Treatment: 11 Active Problems Location of Pain Severity and Description of Pain Patient Has Paino No Site Locations Pain Management and Medication Current Pain Management: Electronic Signature(s) Signed: 04/09/2017 4:36:47 PM By: Alric Quan Entered By: Alric Quan on 04/06/2017 15:17:39 [name] ([id number]) -------------------------------------------------------------------------------- Patient/Caregiver Education Details Patient Name: [name]. Date of Service: 04/06/2017 3:15 PM Medical Record Number: [id number] Patient Account Number: [id number] Date of Birth/Gender: [birth year] (81 y.o.  Female) Treating RN: Ahmed Prima Primary Care Physician: Lamonte Sakai Other Clinician: Referring Physician: Lamonte Sakai Treating Physician/Extender: Tito Dine in Treatment: 11 Education Assessment Education Provided To: Patient Education Topics Provided Wound/Skin Impairment: Handouts: Other: Please call or contact our office if you have any questions or concerns. Methods: Demonstration, Explain/Verbal Responses: State content correctly Electronic Signature(s) Signed: 04/09/2017 4:36:47 PM By: Alric Quan Entered By: Alric Quan on 04/06/2017 15:39:23 [name] ([id number]) -------------------------------------------------------------------------------- Wound Assessment Details Patient Name: [name]. Date of Service: 04/06/2017 3:15 PM Medical Record Number: [id number] Patient Account Number: [id number] Date of Birth/Sex: December 21, 1935 (81 y.o. Female) Treating RN: Carolyne Fiscal, Debi Primary Care Deshonda Cryderman: Lamonte Sakai Other Clinician: Referring Muzamil Harker: Lamonte Sakai Treating Emeril Stille/Extender: Tito Dine in Treatment: 11 Wound Status Wound Number: 1 Primary Etiology: Trauma, Other Wound Location: Left Lower Leg - Anterior Wound Status: Open Wounding Event: Trauma Comorbid Received Chemotherapy, Received History: Radiation Date Acquired: 01/07/2017 Weeks Of Treatment: 11 Clustered Wound: No Photos Photo Uploaded By: Alric Quan on 04/06/2017 16:31:03 Wound Measurements Length: (cm) 0 % Re Width: (cm) 0 % Re Depth: (cm) 0 Epit Area: (cm) 0 Tun Volume: (cm) 0 Und duction in Area: 100% duction in Volume: 100% helialization: Large (67-100%) neling: No ermining: No Wound Description Full Thickness Without Exposed Support Classification: Structures Wound Margin:  Indistinct, nonvisible Exudate None Present Amount: Foul Odor After Cleansing: No Slough/Fibrino No Wound Bed Granulation Amount: None Present  (0%) Exposed Structure Necrotic Amount: None Present (0%) Fascia Exposed: No Fat Layer (Subcutaneous Tissue) Exposed: No Tendon Exposed: No Muscle Exposed: No Joint Exposed: No Bone Exposed: No Periwound Skin Texture Texture Color [name], [name] ([id number]) No Abnormalities Noted: No No Abnormalities Noted: No Callus: No Atrophie Blanche: No Crepitus: No Cyanosis: No Excoriation: No Ecchymosis: Yes Induration: No Erythema: No Rash: No Hemosiderin Staining: No Scarring: Yes Mottled: No Pallor: No Moisture Rubor: No No Abnormalities Noted: No Dry / Scaly: No Temperature / Pain Maceration: No Temperature: No Abnormality Wound Preparation Ulcer Cleansing: Rinsed/Irrigated with Saline Topical Anesthetic Applied: None Electronic Signature(s) Signed: 04/09/2017 4:36:47 PM By: Alric Quan Entered By: Alric Quan on 04/06/2017 15:32:26 [name] ([id number]) -------------------------------------------------------------------------------- Vitals Details Patient Name: Summer Lovely D. Date of Service: 04/06/2017 3:15 PM Medical Record Number: [id number] Patient Account Number: [id number] Date of Birth/Sex: 20-Dec-1935 (81 y.o. Female) Treating RN: Carolyne Fiscal, Debi Primary Care Tyreon Frigon: Lamonte Sakai Other Clinician: Referring Angel Hobdy: Lamonte Sakai Treating Gradyn Shein/Extender: Tito Dine in Treatment: 11 Vital Signs Time Taken: 15:19 Temperature (F): 97.9 Height (in): 68 Pulse (bpm): 70 Weight (lbs): 169 Respiratory Rate (breaths/min): 18 Body Mass Index (BMI): 25.7 Blood Pressure (mmHg): 150/57 Reference Range: 80 - 120 mg / dl Electronic Signature(s) Signed: 04/09/2017 4:36:47 PM By: Alric Quan Entered By: Alric Quan on 04/06/2017 15:20:10

## 2017-10-13 ENCOUNTER — Other Ambulatory Visit: Payer: Self-pay | Admitting: Internal Medicine

## 2017-10-13 DIAGNOSIS — Z1231 Encounter for screening mammogram for malignant neoplasm of breast: Secondary | ICD-10-CM

## 2017-11-12 ENCOUNTER — Emergency Department
Admission: EM | Admit: 2017-11-12 | Discharge: 2017-11-12 | Disposition: A | Discharge status: Home / Self Care | Payer: Medicare Other | Attending: Emergency Medicine | Admitting: Emergency Medicine

## 2017-11-12 ENCOUNTER — Other Ambulatory Visit: Payer: Self-pay

## 2017-11-12 DIAGNOSIS — Z853 Personal history of malignant neoplasm of breast: Secondary | ICD-10-CM | POA: Diagnosis not present

## 2017-11-12 DIAGNOSIS — Z9221 Personal history of antineoplastic chemotherapy: Secondary | ICD-10-CM | POA: Diagnosis not present

## 2017-11-12 DIAGNOSIS — R202 Paresthesia of skin: Secondary | ICD-10-CM | POA: Diagnosis not present

## 2017-11-12 DIAGNOSIS — M5410 Radiculopathy, site unspecified: Secondary | ICD-10-CM | POA: Insufficient documentation

## 2017-11-12 DIAGNOSIS — F17210 Nicotine dependence, cigarettes, uncomplicated: Secondary | ICD-10-CM | POA: Diagnosis not present

## 2017-11-12 DIAGNOSIS — M79604 Pain in right leg: Secondary | ICD-10-CM | POA: Diagnosis present

## 2017-11-12 DIAGNOSIS — M79605 Pain in left leg: Secondary | ICD-10-CM | POA: Insufficient documentation

## 2017-11-12 LAB — COMPREHENSIVE METABOLIC PANEL
ALBUMIN: 4.3 g/dL (ref 3.5–5.0)
ALK PHOS: 94 U/L (ref 38–126)
ALT: 34 U/L (ref 14–54)
AST: 41 U/L (ref 15–41)
Anion gap: 8 (ref 5–15)
BUN: 17 mg/dL (ref 6–20)
CALCIUM: 9.3 mg/dL (ref 8.9–10.3)
CO2: 26 mmol/L (ref 22–32)
CREATININE: 0.73 mg/dL (ref 0.44–1.00)
Chloride: 104 mmol/L (ref 101–111)
GFR calc non Af Amer: >60 mL/min (ref >60)
GLUCOSE: 100 mg/dL — AB (ref 65–99)
Potassium: 4.2 mmol/L (ref 3.5–5.1)
Sodium: 138 mmol/L (ref 135–145)
Total Bilirubin: 1.3 mg/dL — ABNORMAL HIGH (ref 0.3–1.2)
Total Protein: 7.8 g/dL (ref 6.5–8.1)

## 2017-11-12 LAB — CBC
HCT: 44 % (ref 35.0–47.0)
HEMOGLOBIN: 14.9 g/dL (ref 12.0–16.0)
MCH: 31.4 pg (ref 26.0–34.0)
MCHC: 33.8 g/dL (ref 32.0–36.0)
MCV: 92.9 fL (ref 80.0–100.0)
Platelets: 133 10*3/uL — ABNORMAL LOW (ref 150–440)
RBC: 4.74 MIL/uL (ref 3.80–5.20)
RDW: 15.1 % — ABNORMAL HIGH (ref 11.5–14.5)
WBC: 4.9 10*3/uL (ref 3.6–11.0)

## 2017-11-12 LAB — CK: Total CK: 72 U/L (ref 38–234)

## 2017-11-12 MED ORDER — MELOXICAM 15 MG PO TABS: 15.0000 mg | ORAL_TABLET | Freq: Every day | ORAL | 2 refills | Status: AC

## 2017-11-12 NOTE — ED Notes (Signed)
First Nurse Note:  Patient ambulatory complaining of pain in both feet.

## 2017-11-12 NOTE — ED Notes (Signed)
MD at bedside. 

## 2017-11-12 NOTE — ED Provider Notes (Signed)
[facility] Emergency Department Provider Note   ____________________________________________    I have reviewed the triage vital signs and the nursing notes.   HISTORY  Chief Complaint Leg pain    HPI [name] is a 82 y.o. female who presents with complaints of bilateral leg pain.  She notes this is a chronic issue.  She has intermittent cramping tingling from the knees down bilaterally and has had this for many years.  She attributes this to chemotherapy medication for breast cancer.  Presents today because symptoms interfered with her sleep last night.  She does not have a history of diabetes.  Has not taken anything for this today.   Past Medical History:  Diagnosis Date  . Arthritis    lower back, knees  . Breast cancer ([place]) 2011   RT LUMPECTOMY  . Chemotherapy follow-up examination 2011   RT BREAST CANCER  . Cirrhosis ([place])   . Radiation 2011   RT BREAST CANCER  . Wears dentures    full upper, partial lower    Patient Active Problem List   Diagnosis Date Noted  . Breast cancer ([place]) 11/02/2014  . Breast CA ([place]) 01/19/2013    Past Surgical History:  Procedure Laterality Date  . BREAST BIOPSY Right 2014   RT CORE W/CLIP - NEG  . BREAST EXCISIONAL BIOPSY Right 2011   breast ca  . CATARACT EXTRACTION W/PHACO Right 04/15/2016   Procedure: CATARACT EXTRACTION PHACO AND INTRAOCULAR LENS PLACEMENT (IOC);  Surgeon: Leandrew Koyanagi, MD;  Location: [place];  Service: Ophthalmology;  Laterality: Right;  . CATARACT EXTRACTION W/PHACO Left 07/01/2016   Procedure: CATARACT EXTRACTION PHACO AND INTRAOCULAR LENS PLACEMENT (IOC);  Surgeon: Leandrew Koyanagi, MD;  Location: [place];  Service: Ophthalmology;  Laterality: Left;  Left eye  . CHOLECYSTECTOMY      Prior to Admission medications   Medication Sig Start Date End Date Taking? Authorizing Provider  meloxicam (MOBIC) 15 MG tablet Take 1 tablet (15 mg  total) by mouth daily. 11/12/17 11/12/18  Lavonia Drafts, MD  Pseudoephedrine-Guaifenesin [facility] D PO) Take by mouth as needed.    [provider]     Allergies Ivp dye [iodinated diagnostic agents]  Family History  Problem Relation Age of Onset  . Cancer Father        prostate  . Cancer Brother        prostate    Social History Social History   Tobacco Use  . Smoking status: Current Every Day Smoker    Packs/day: 0.50    Types: Cigarettes    Last attempt to quit: 04/28/2014    Years since quitting: 3.5  . Smokeless tobacco: Former Network engineer Use Topics  . Alcohol use: No    Alcohol/week: 0.0 oz    Comment: occasional glass of wine  . Drug use: No    Review of Systems  Constitutional: No fever/chills Eyes: No visual changes.  ENT: No neck pain Cardiovascular: Denies chest pain. Respiratory: Denies cough Gastrointestinal: No nausea, no vomiting.   Genitourinary: Negative for dysuria. Musculoskeletal: As above Skin: Negative for rash. Neurological: Negative for  weakness   ____________________________________________   PHYSICAL EXAM:  VITAL SIGNS: ED Triage Vitals  Enc Vitals Group     BP 11/12/17 1022 (!) 172/67     Pulse Rate 11/12/17 1022 72     Resp 11/12/17 1022 18     Temp 11/12/17 1022 98.1 F (36.7 C)  Temp Source 11/12/17 1022 Oral     SpO2 11/12/17 1022 95 %     Weight 11/12/17 1023 77.1 kg (170 lb)     Height 11/12/17 1023 1.702 m (5\' 7" )     Head Circumference --      Peak Flow --      Pain Score 11/12/17 1022 10     Pain Loc --      Pain Edu? --      Excl. in [place]? --     Constitutional: Alert and oriented. No acute distress. Pleasant and interactive Eyes: Conjunctivae are normal.   Nose: No congestion/rhinnorhea. Mouth/Throat: Mucous membranes are moist.    Cardiovascular: Normal rate, regular rhythm. Grossly normal heart sounds.  Good peripheral circulation. Respiratory: Normal respiratory effort.  No  retractions. Lungs CTAB. Gastrointestinal: Soft and nontender. No distention.  No CVA tenderness.  Musculoskeletal: No lower extremity tenderness nor edema.  Warm and well perfused, unremarkable exam Neurologic:  Normal speech and language. No gross focal neurologic deficits are appreciated.  Skin:  Skin is warm, dry and intact. No rash noted. Psychiatric: Mood and affect are normal. Speech and behavior are normal.  ____________________________________________   LABS (all labs ordered are listed, but only abnormal results are displayed)  Labs Reviewed  CBC - Abnormal; Notable for the following components:      Result Value   RDW 15.1 (*)    Platelets 133 (*)    All other components within normal limits  COMPREHENSIVE METABOLIC PANEL - Abnormal; Notable for the following components:   Glucose, Bld 100 (*)    Total Bilirubin 1.3 (*)    All other components within normal limits  CK   ____________________________________________  EKG  None ____________________________________________  RADIOLOGY  None ____________________________________________   PROCEDURES  Procedure(s) performed: No  Procedures   Critical Care performed: No ____________________________________________   INITIAL IMPRESSION / ASSESSMENT AND PLAN / ED COURSE  Pertinent labs & imaging results that were available during my care of the patient were reviewed by me and considered in my medical decision making (see chart for details).  Patient with likely radiculopathy, chronic.  Lab work/l electrolytes are unremarkable.  CK is normal.  History of low platelets   Patient states meloxicam is helped in the past would like a prescription for this.  She does not want to take gabapentin as she thinks it has affected her vision in the past, encouraged outpatient follow-up   ____________________________________________   FINAL CLINICAL IMPRESSION(S) / ED DIAGNOSES  Final diagnoses:  Radiculopathy,  unspecified spinal region        Note:  This document was prepared using Dragon voice recognition software and may include unintentional dictation errors.    Lavonia Drafts, MD 11/12/17 [phone]

## 2017-11-12 NOTE — ED Notes (Signed)
Pt c/o achy/cramping pain from the knees down BL. Pt states she had breast cancer with chemotherapy and was on the pills for five years and during that time she had the pain start, states they told her it would stop after stopping the medication but it never did, states she takes meloxicam normally when the pain is too severe but she recently changed doctors and her prescription ran out.

## 2017-11-12 NOTE — ED Triage Notes (Signed)
Pt states that she has been having all over body cramping for the past 2 weeks, denies using a diuretic, states that when she took the cancer medication it caused her cramps and this reminds her of that medication and states that her knees are throbbing bilat, unable to sleep due to pain last night.

## 2017-11-30 ENCOUNTER — Emergency Department: Payer: Medicare Other

## 2017-11-30 ENCOUNTER — Encounter: Payer: Self-pay | Admitting: Emergency Medicine

## 2017-11-30 ENCOUNTER — Emergency Department
Admission: EM | Admit: 2017-11-30 | Discharge: 2017-11-30 | Disposition: A | Discharge status: Home / Self Care | Payer: Medicare Other | Attending: Emergency Medicine | Admitting: Emergency Medicine

## 2017-11-30 DIAGNOSIS — M5441 Lumbago with sciatica, right side: Secondary | ICD-10-CM | POA: Insufficient documentation

## 2017-11-30 DIAGNOSIS — Z853 Personal history of malignant neoplasm of breast: Secondary | ICD-10-CM | POA: Insufficient documentation

## 2017-11-30 DIAGNOSIS — F17210 Nicotine dependence, cigarettes, uncomplicated: Secondary | ICD-10-CM | POA: Diagnosis not present

## 2017-11-30 DIAGNOSIS — M5442 Lumbago with sciatica, left side: Secondary | ICD-10-CM | POA: Insufficient documentation

## 2017-11-30 DIAGNOSIS — Z79899 Other long term (current) drug therapy: Secondary | ICD-10-CM | POA: Insufficient documentation

## 2017-11-30 DIAGNOSIS — M545 Low back pain: Secondary | ICD-10-CM | POA: Diagnosis present

## 2017-11-30 DIAGNOSIS — G8929 Other chronic pain: Secondary | ICD-10-CM

## 2017-11-30 LAB — URINALYSIS, COMPLETE (UACMP) WITH MICROSCOPIC
Bilirubin Urine: NEGATIVE
Glucose, UA: NEGATIVE mg/dL
Hgb urine dipstick: NEGATIVE
Ketones, ur: NEGATIVE mg/dL
Leukocytes, UA: NEGATIVE
Nitrite: NEGATIVE
Protein, ur: NEGATIVE mg/dL
SPECIFIC GRAVITY, URINE: 1.009 (ref 1.005–1.030)
pH: 6 (ref 5.0–8.0)

## 2017-11-30 LAB — COMPREHENSIVE METABOLIC PANEL
ALK PHOS: 95 U/L (ref 38–126)
ALT: 30 U/L (ref 0–44)
AST: 42 U/L — ABNORMAL HIGH (ref 15–41)
Albumin: 4.3 g/dL (ref 3.5–5.0)
Anion gap: 7 (ref 5–15)
BILIRUBIN TOTAL: 1 mg/dL (ref 0.3–1.2)
BUN: 19 mg/dL (ref 8–23)
CALCIUM: 9.5 mg/dL (ref 8.9–10.3)
CO2: 25 mmol/L (ref 22–32)
CREATININE: 0.61 mg/dL (ref 0.44–1.00)
Chloride: 110 mmol/L (ref 98–111)
Glucose, Bld: 102 mg/dL — ABNORMAL HIGH (ref 70–99)
Potassium: 3.9 mmol/L (ref 3.5–5.1)
Sodium: 142 mmol/L (ref 135–145)
TOTAL PROTEIN: 7.9 g/dL (ref 6.5–8.1)

## 2017-11-30 LAB — CBC WITH DIFFERENTIAL/PLATELET
BASOS PCT: 1 %
Basophils Absolute: 0.1 10*3/uL (ref 0–0.1)
EOS ABS: 0.1 10*3/uL (ref 0–0.7)
Eosinophils Relative: 2 %
HCT: 42.8 % (ref 35.0–47.0)
Hemoglobin: 14.6 g/dL (ref 12.0–16.0)
Lymphocytes Relative: 26 %
Lymphs Abs: 1.4 10*3/uL (ref 1.0–3.6)
MCH: 31.8 pg (ref 26.0–34.0)
MCHC: 34.2 g/dL (ref 32.0–36.0)
MCV: 93 fL (ref 80.0–100.0)
MONO ABS: 0.4 10*3/uL (ref 0.2–0.9)
MONOS PCT: 8 %
NEUTROS PCT: 63 %
Neutro Abs: 3.4 10*3/uL (ref 1.4–6.5)
Platelets: 121 10*3/uL — ABNORMAL LOW (ref 150–440)
RBC: 4.6 MIL/uL (ref 3.80–5.20)
RDW: 14.9 % — AB (ref 11.5–14.5)
WBC: 5.4 10*3/uL (ref 3.6–11.0)

## 2017-11-30 LAB — CK: Total CK: 48 U/L (ref 38–234)

## 2017-11-30 LAB — PROTIME-INR
INR: 0.98
PROTHROMBIN TIME: 12.9 s (ref 11.4–15.2)

## 2017-11-30 MED ORDER — SODIUM CHLORIDE 0.9 % IV BOLUS
1000.0000 mL | Freq: Once | INTRAVENOUS | Status: AC
  Administered 2017-11-30: 1000 mL via INTRAVENOUS

## 2017-11-30 MED ORDER — PREDNISONE 10 MG PO TABS: 10.0000 mg | ORAL_TABLET | Freq: Every day | ORAL | 0 refills | Status: DC

## 2017-11-30 MED ORDER — METHOCARBAMOL 500 MG PO TABS: 500.0000 mg | ORAL_TABLET | Freq: Every evening | ORAL | 0 refills | Status: DC | PRN

## 2017-11-30 MED ORDER — DEXAMETHASONE SODIUM PHOSPHATE 10 MG/ML IJ SOLN
10.0000 mg | Freq: Once | INTRAMUSCULAR | Status: AC
  Administered 2017-11-30: 10 mg via INTRAVENOUS
  Filled 2017-11-30: qty 1

## 2017-11-30 MED ORDER — TRAMADOL HCL 50 MG PO TABS: 50.0000 mg | ORAL_TABLET | Freq: Four times a day (QID) | ORAL | 0 refills | Status: DC | PRN

## 2017-11-30 NOTE — ED Notes (Signed)
Pt back from MRI 

## 2017-11-30 NOTE — ED Notes (Signed)
See triage note   Presents with lower back pain states this is a chronic issue  States pain usually eased off with pain meds

## 2017-11-30 NOTE — ED Provider Notes (Signed)
[facility] Emergency Department Provider Note  ____________________________________________  Time seen: Approximately 3:18 PM  I have reviewed the triage vital signs and the nursing notes.   HISTORY  Chief Complaint Back Pain    HPI [name] is a 81 y.o. female who presents the emergency department complaining of increasing lower back pain, increasing radicular symptoms, urinary incontinence.  Patient reports that she has a long-standing history of lower back pain with sciatica/radicular symptoms.  Patient reports that this typically waxes and wanes in intensity.  She reports that the pain has increased worse than it ever been.  Patient denies any trauma to include falls, motor vehicle accident, direct trauma to the lumbar spine.  Patient reports difficulty with urinary incontinence over the past 2 days as well.  Patient denies any saddle anesthesia or gross paresthesias.  Patient does have a history of low back surgery with "rod" place.  Patient was unsure whether this was from degenerative changes or scoliosis.  This surgery occurred 21 years ago.  Patient denies any headache, neck pain, chest pain, short of breath, abdominal pain, nausea or vomiting, diarrhea constipation, polyuria, hematuria, dysuria.  Patient does have a history of liver cirrhosis.  Patient reports that she had fatty liver which was then worsened after she underwent radiation and chemotherapy for breast cancer.  Patient is in remission from breast cancer at this time.  She denies any unexplained fatigue, unexplained fevers or chills, unexplained weight loss over the past several months.    Past Medical History:  Diagnosis Date  . Arthritis    lower back, knees  . Breast cancer ([place]) 2011   RT LUMPECTOMY  . Chemotherapy follow-up examination 2011   RT BREAST CANCER  . Cirrhosis ([place])   . Radiation 2011   RT BREAST CANCER  . Wears dentures    full upper, partial lower    Patient  Active Problem List   Diagnosis Date Noted  . Breast cancer ([place]) 11/02/2014  . Breast CA ([place]) 01/19/2013    Past Surgical History:  Procedure Laterality Date  . BREAST BIOPSY Right 2014   RT CORE W/CLIP - NEG  . BREAST EXCISIONAL BIOPSY Right 2011   breast ca  . CATARACT EXTRACTION W/PHACO Right 04/15/2016   Procedure: CATARACT EXTRACTION PHACO AND INTRAOCULAR LENS PLACEMENT (IOC);  Surgeon: Leandrew Koyanagi, MD;  Location: [place];  Service: Ophthalmology;  Laterality: Right;  . CATARACT EXTRACTION W/PHACO Left 07/01/2016   Procedure: CATARACT EXTRACTION PHACO AND INTRAOCULAR LENS PLACEMENT (IOC);  Surgeon: Leandrew Koyanagi, MD;  Location: [place];  Service: Ophthalmology;  Laterality: Left;  Left eye  . CHOLECYSTECTOMY      Prior to Admission medications   Medication Sig Start Date End Date Taking? Authorizing Provider  meloxicam (MOBIC) 15 MG tablet Take 1 tablet (15 mg total) by mouth daily. 11/12/17 11/12/18  Lavonia Drafts, MD  methocarbamol (ROBAXIN) 500 MG tablet Take 1 tablet (500 mg total) by mouth at bedtime as needed for muscle spasms. 11/30/17   Cuthriell, Charline Bills, PA-C  predniSONE (DELTASONE) 10 MG tablet Take 1 tablet (10 mg total) by mouth daily. 11/30/17   Cuthriell, Charline Bills, PA-C  Pseudoephedrine-Guaifenesin (MUCINEX D PO) Take by mouth as needed.    [provider]  traMADol (ULTRAM) 50 MG tablet Take 1 tablet (50 mg total) by mouth every 6 (six) hours as needed. 11/30/17   Cuthriell, Charline Bills, PA-C    Allergies Ivp dye [iodinated diagnostic agents]  Family History  Problem Relation Age of Onset  . Cancer Father        prostate  . Cancer Brother        prostate    Social History Social History   Tobacco Use  . Smoking status: Current Every Day Smoker    Packs/day: 0.50    Types: Cigarettes    Last attempt to quit: 04/28/2014    Years since quitting: 3.5  . Smokeless tobacco: Former Network engineer Use Topics   . Alcohol use: No    Alcohol/week: 0.0 oz    Comment: occasional glass of wine  . Drug use: No     Review of Systems  Constitutional: No fever/chills Eyes: No visual changes. No discharge ENT: No upper respiratory complaints. Cardiovascular: no chest pain. Respiratory: no cough. No SOB. Gastrointestinal: No abdominal pain.  No nausea, no vomiting.  No diarrhea.  No constipation. Genitourinary: Negative for dysuria. No hematuria.  Mild urinary incontinence. Musculoskeletal: Positive for lower back pain with increasing radicular symptoms down the bilateral lower extremities. Skin: Negative for rash, abrasions, lacerations, ecchymosis. Neurological: Negative for headaches, focal weakness or numbness. 10-point ROS otherwise negative.  ____________________________________________   PHYSICAL EXAM:  VITAL SIGNS: ED Triage Vitals [11/30/17 1449]  Enc Vitals Group     BP (!) 146/76     Pulse Rate 69     Resp 18     Temp 98.2 F (36.8 C)     Temp Source Oral     SpO2 95 %     Weight 170 lb (77.1 kg)     Height 5\' 7"  (1.702 m)     Head Circumference      Peak Flow      Pain Score 10     Pain Loc      Pain Edu?      Excl. in [place]?      Constitutional: Alert and oriented. Well appearing and in no acute distress. Eyes: Conjunctivae are normal. PERRL. EOMI. Head: Atraumatic. Neck: No stridor.  No cervical spine tenderness to palpation.  Cardiovascular: Normal rate, regular rhythm. Normal S1 and S2.  Good peripheral circulation. Respiratory: Normal respiratory effort without tachypnea or retractions. Lungs CTAB. Good air entry to the bases with no decreased or absent breath sounds. Gastrointestinal: Bowel sounds 4 quadrants. Soft and nontender to palpation. No guarding or rigidity. No palpable masses. No distention. No CVA tenderness. Musculoskeletal: Full range of motion to all extremities. No gross deformities appreciated.  Limited rotation of the lumbar spine due to pain.  No  visible deformity or gross abnormality.  Patient is tender to palpation midline spine between the L3 and L4 vertebrae.  Mild tenderness throughout the rest of the lumbar spine.  No paraspinal muscle tenderness to palpation.  No tenderness to palpation over bilateral sciatic notches.  Positive for bilateral straight leg raise.  Dorsalis pedis pulse intact bilateral lower extremity's.  Sensation intact and equal in all dermatomal distributions bilaterally in the lower extremities. Neurologic:  Normal speech and language. No gross focal neurologic deficits are appreciated.  Skin:  Skin is warm, dry and intact. No rash noted. Psychiatric: Mood and affect are normal. Speech and behavior are normal. Patient exhibits appropriate insight and judgement.   ____________________________________________   LABS (all labs ordered are listed, but only abnormal results are displayed)  Labs Reviewed  COMPREHENSIVE METABOLIC PANEL - Abnormal; Notable for the following components:      Result Value   Glucose, Bld 102 (*)  AST 42 (*)    All other components within normal limits  CBC WITH DIFFERENTIAL/PLATELET - Abnormal; Notable for the following components:   RDW 14.9 (*)    Platelets 121 (*)    All other components within normal limits  URINALYSIS, COMPLETE (UACMP) WITH MICROSCOPIC - Abnormal; Notable for the following components:   Color, Urine STRAW (*)    APPearance CLEAR (*)    Bacteria, UA RARE (*)    All other components within normal limits  PROTIME-INR  CK   ____________________________________________  EKG   ____________________________________________  RADIOLOGY I personally viewed and evaluated these images as part of my medical decision making, as well as reviewing the written report by the radiologist.  Dg Lumbar Spine Complete  Result Date: 11/30/2017 CLINICAL DATA:  Low back pain.  Sciatica.  Denies trauma. EXAM: LUMBAR SPINE - COMPLETE 4+ VIEW COMPARISON:  10/08/2015 FINDINGS:  There is a very mild curvature of the lumbar spine which appears convex towards the right. Previous posterior hardware fixation of L3 through L5 with solid fusion of these levels. Disc space narrowing and ventral endplate spurring is noted at T12-L1, L1-2 and L2-3. The vertebral body heights are well preserved. No fractures identified. Atherosclerotic calcifications involve the abdominal aorta. IMPRESSION: 1. No acute findings. 2. Status post PLIF with solid fusion of L3 through L5. 3. Degenerative disc disease. 4. Aortic atherosclerosis.  Aortic Atherosclerosis (ICD10-I70.0). Electronically Signed   By: Kerby Moors M.D.   On: 11/30/2017 16:46   Mr Lumbar Spine Wo Contrast  Result Date: 11/30/2017 CLINICAL DATA:  History of chronic low back pain with sudden significant worsening of pain. No trauma. EXAM: MRI LUMBAR SPINE WITHOUT CONTRAST TECHNIQUE: Multiplanar, multisequence MR imaging of the lumbar spine was performed. No intravenous contrast was administered. COMPARISON:  Lumbar spine x-rays from same day. MR lumbar spine dated January 23, 2015. FINDINGS: Segmentation:  Standard. Alignment:  Physiologic. Vertebrae: Prior L3-L5 posterior fusion. No fracture, evidence of discitis, or suspicious bone lesion. Unchanged hemangioma within the L1 vertebral body. Conus medullaris and cauda equina: Conus extends to the L1-L2 level. Conus and cauda equina appear normal. Paraspinal and other soft tissues: Bilateral renal cysts. Otherwise negative. Disc levels: T12-L1: Mild disc degeneration without significant disc bulge. No stenosis. L1-L2:  Trace disc bulge.  No stenosis. L2-L3: Diffuse disc bulge and moderate bilateral facet arthropathy with ligamentum flavum hypertrophy resulting in severe central spinal canal stenosis and moderate left neuroforaminal stenosis, unchanged since 2016. No right neuroforaminal stenosis. L3-L4:  Prior posterior decompression and fusion.  No stenosis. L4-L5:  Prior posterior decompression  and fusion.  No stenosis. L5-S1:  Moderate bilateral facet arthropathy.  No stenosis. IMPRESSION: 1. No acute abnormality. 2. Prior L3-L5 posterior fusion. Advanced adjacent level disease at L2-L3 with severe central spinal canal stenosis and moderate left neuroforaminal stenosis is unchanged since 2016. Electronically Signed   By: Titus Dubin M.D.   On: 11/30/2017 18:08    ____________________________________________    PROCEDURES  Procedure(s) performed:    Procedures    Medications  dexamethasone (DECADRON) injection 10 mg (has no administration in time range)  sodium chloride 0.9 % bolus 1,000 mL (0 mLs Intravenous Stopped 11/30/17 1809)     ____________________________________________   INITIAL IMPRESSION / ASSESSMENT AND PLAN / ED COURSE  Pertinent labs & imaging results that were available during my care of the patient were reviewed by me and considered in my medical decision making (see chart for details).  Review of the [place] CSRS was  performed in accordance of the [place] prior to dispensing any controlled drugs.  Clinical Course as of Nov 30 1921  Tue Nov 30, 2017  1556 Patient presents the emergency department with a significant increase in lower back pain with increasing radicular symptoms.  Patient is also experiencing urinary incontinence which is not patient's baseline.  Patient does have a history of lumbar surgery but she was unsure whether this was from degenerative disc disease, scoliosis, ruptured disc.  She has in place hardware.  Patient denies any recent trauma.  Patient also endorsed some lower extremity cramping over the past several days.  Given patient's symptoms, medical history, differential includes spinal metastasis, lumbar strain, spinal stenosis, lumbar outlet stenosis, weakening or displacement of hardware, urinary tract disease, mild dehydration, rhabdo.  On inspection of medical records, no recent imaging of the lumbar spine.  Given patient's  symptoms, patient will be evaluated with basic labs, x-ray of the lumbar spine.  Patient will likely be a candidate for MRI pending x-ray results.   [JC]  5366 X-ray returned with significant degenerative disc disease, fusion of the L3-L5 spine intact.  Given patient's symptoms and these findings, this is likely worsening of a chronic problem.  However, given patient's sudden worsening with no trauma and history of incontinence, patient will be further evaluated with MRI.     [JC]    Clinical Course User Index [JC] Cuthriell, Charline Bills, PA-C     Patient's diagnosis is consistent with chronic midline lower back pain with bilateral radicular symptoms.  Patient presents the emergency department with worsening of lower back pain with reported urinary incontinence.  Given patient's medical history, physical exam findings, symptoms, patient was evaluated with basic labs, imaging both x-ray and MRI.  Patient does have central cord stenosis on MRI but this is unchanged from previous MRI 3 years prior.  At this time, patient has been incontinent with urine while in the emergency department.  No significant neuro deficits are appreciated on exam.  Given reassuring imaging as well as symptoms, patient will be treated with steroids, limited muscle relaxer at nighttime, very limited prescription of Ultram for pain.  Patient is counseled on the use of these medications including strict precautions not to take muscle relaxer and pain medication at the same time.  Patient verbalizes understanding of the medications and their potential side effects.  If patient's symptoms persist or worsen, she will follow-up with neurosurgery.  Otherwise, patient will follow primary care as needed.  Patient is given ED precautions to return to the ED for any worsening or new symptoms.     ____________________________________________  FINAL CLINICAL IMPRESSION(S) / ED DIAGNOSES  Final diagnoses:  Chronic midline low back pain  with bilateral sciatica      NEW MEDICATIONS STARTED DURING THIS VISIT:  ED Discharge Orders        Ordered    predniSONE (DELTASONE) 10 MG tablet  Daily    Note to Pharmacy:  Take 6 pills x 2 days, 5 pills x 2 days, 4 pills x 2 days, 3 pills x 2 days, 2 pills x 2 days, and 1 pill x 2 days   11/30/17 1920    traMADol (ULTRAM) 50 MG tablet  Every 6 hours PRN     11/30/17 1920    methocarbamol (ROBAXIN) 500 MG tablet  At bedtime PRN     11/30/17 1920          This chart was dictated using voice recognition software/Dragon. Despite best efforts  to proofread, errors can occur which can change the meaning. Any change was purely unintentional.    Darletta Moll, PA-C 11/30/17 1923    Nena Polio, MD 11/30/17 [phone]

## 2017-11-30 NOTE — ED Triage Notes (Signed)
Pt arrived with complaints of lower back pain. Pt states she has had intermittent back pain "for awhile." However, pt states this episode is the most severe it has ever been. Pt states she can normally take her pain medication and it will be relieved. Pt states she has had no relief from pain with her medication.Pt has been seen at urgent care who stated she needed and MRI. Pt states she cannot get in with her PCP till August. No chest pain or shortness of breath. Pt denies any numbness in triage.

## 2018-01-14 ENCOUNTER — Other Ambulatory Visit: Payer: Self-pay | Admitting: Nurse Practitioner

## 2018-01-14 DIAGNOSIS — Z1231 Encounter for screening mammogram for malignant neoplasm of breast: Secondary | ICD-10-CM

## 2018-02-09 ENCOUNTER — Ambulatory Visit
Admission: RE | Admit: 2018-02-09 | Discharge: 2018-02-09 | Disposition: A | Discharge status: Home / Self Care | Payer: Medicare Other | Source: Ambulatory Visit | Attending: Nurse Practitioner | Admitting: Nurse Practitioner

## 2018-02-09 DIAGNOSIS — Z1231 Encounter for screening mammogram for malignant neoplasm of breast: Secondary | ICD-10-CM | POA: Diagnosis present

## 2018-02-09 HISTORY — DX: Personal history of irradiation: Z92.3

## 2018-02-09 HISTORY — DX: Personal history of antineoplastic chemotherapy: Z92.21

## 2018-07-20 ENCOUNTER — Other Ambulatory Visit: Payer: Self-pay | Admitting: Nurse Practitioner

## 2018-07-20 DIAGNOSIS — M159 Polyosteoarthritis, unspecified: Secondary | ICD-10-CM

## 2018-07-20 DIAGNOSIS — M5441 Lumbago with sciatica, right side: Principal | ICD-10-CM

## 2018-07-20 DIAGNOSIS — G8929 Other chronic pain: Secondary | ICD-10-CM

## 2018-07-20 DIAGNOSIS — M150 Primary generalized (osteo)arthritis: Secondary | ICD-10-CM

## 2018-07-20 DIAGNOSIS — M85859 Other specified disorders of bone density and structure, unspecified thigh: Secondary | ICD-10-CM

## 2018-07-20 DIAGNOSIS — M5442 Lumbago with sciatica, left side: Principal | ICD-10-CM

## 2018-07-22 DIAGNOSIS — M48061 Spinal stenosis, lumbar region without neurogenic claudication: Secondary | ICD-10-CM | POA: Insufficient documentation

## 2018-07-22 DIAGNOSIS — Z9889 Other specified postprocedural states: Secondary | ICD-10-CM | POA: Insufficient documentation

## 2018-08-30 ENCOUNTER — Other Ambulatory Visit: Payer: Medicare Other

## 2018-10-19 ENCOUNTER — Other Ambulatory Visit: Payer: Medicare Other

## 2018-10-25 ENCOUNTER — Other Ambulatory Visit: Payer: Medicare Other

## 2019-01-17 ENCOUNTER — Other Ambulatory Visit: Payer: Self-pay | Admitting: Nurse Practitioner

## 2019-01-17 DIAGNOSIS — Z1231 Encounter for screening mammogram for malignant neoplasm of breast: Secondary | ICD-10-CM

## 2019-02-13 ENCOUNTER — Ambulatory Visit: Payer: Medicare Other

## 2019-02-28 ENCOUNTER — Ambulatory Visit
Admission: RE | Admit: 2019-02-28 | Discharge: 2019-02-28 | Disposition: A | Discharge status: Home / Self Care | Payer: Medicare Other | Source: Ambulatory Visit | Attending: Nurse Practitioner | Admitting: Nurse Practitioner

## 2019-02-28 ENCOUNTER — Other Ambulatory Visit: Payer: Self-pay

## 2019-02-28 ENCOUNTER — Encounter (INDEPENDENT_AMBULATORY_CARE_PROVIDER_SITE_OTHER): Payer: Self-pay

## 2019-02-28 DIAGNOSIS — Z1231 Encounter for screening mammogram for malignant neoplasm of breast: Secondary | ICD-10-CM | POA: Diagnosis not present

## 2019-02-28 HISTORY — DX: Unspecified malignant neoplasm of skin, unspecified: C44.90

## 2019-07-09 ENCOUNTER — Other Ambulatory Visit: Payer: Self-pay

## 2019-07-09 ENCOUNTER — Emergency Department: Payer: Medicare Other

## 2019-07-09 ENCOUNTER — Emergency Department
Admission: EM | Admit: 2019-07-09 | Discharge: 2019-07-09 | Disposition: A | Discharge status: Home / Self Care | Payer: Medicare Other | Attending: Emergency Medicine | Admitting: Emergency Medicine

## 2019-07-09 DIAGNOSIS — R432 Parageusia: Secondary | ICD-10-CM | POA: Insufficient documentation

## 2019-07-09 DIAGNOSIS — Z20822 Contact with and (suspected) exposure to covid-19: Secondary | ICD-10-CM | POA: Insufficient documentation

## 2019-07-09 DIAGNOSIS — N3000 Acute cystitis without hematuria: Secondary | ICD-10-CM | POA: Diagnosis not present

## 2019-07-09 DIAGNOSIS — R519 Headache, unspecified: Secondary | ICD-10-CM | POA: Diagnosis present

## 2019-07-09 DIAGNOSIS — Z79899 Other long term (current) drug therapy: Secondary | ICD-10-CM | POA: Diagnosis not present

## 2019-07-09 DIAGNOSIS — F17210 Nicotine dependence, cigarettes, uncomplicated: Secondary | ICD-10-CM | POA: Insufficient documentation

## 2019-07-09 LAB — URINALYSIS, COMPLETE (UACMP) WITH MICROSCOPIC
Bilirubin Urine: NEGATIVE
Glucose, UA: NEGATIVE mg/dL
Hgb urine dipstick: NEGATIVE
Ketones, ur: NEGATIVE mg/dL
Nitrite: NEGATIVE
Protein, ur: NEGATIVE mg/dL
Specific Gravity, Urine: 1.026 (ref 1.005–1.030)
pH: 5 (ref 5.0–8.0)

## 2019-07-09 LAB — BASIC METABOLIC PANEL
Anion gap: 10 (ref 5–15)
BUN: 34 mg/dL — ABNORMAL HIGH (ref 8–23)
CO2: 26 mmol/L (ref 22–32)
Calcium: 9.6 mg/dL (ref 8.9–10.3)
Chloride: 103 mmol/L (ref 98–111)
Creatinine, Ser: 0.77 mg/dL (ref 0.44–1.00)
GFR calc Af Amer: >60 mL/min (ref >60)
GFR calc non Af Amer: >60 mL/min (ref >60)
Glucose, Bld: 164 mg/dL — ABNORMAL HIGH (ref 70–99)
Potassium: 3.7 mmol/L (ref 3.5–5.1)
Sodium: 139 mmol/L (ref 135–145)

## 2019-07-09 LAB — CBC
HCT: 47.8 % — ABNORMAL HIGH (ref 36.0–46.0)
Hemoglobin: 15.5 g/dL — ABNORMAL HIGH (ref 12.0–15.0)
MCH: 31 pg (ref 26.0–34.0)
MCHC: 32.4 g/dL (ref 30.0–36.0)
MCV: 95.6 fL (ref 80.0–100.0)
Platelets: 184 10*3/uL (ref 150–400)
RBC: 5 MIL/uL (ref 3.87–5.11)
RDW: 14.7 % (ref 11.5–15.5)
WBC: 7.7 10*3/uL (ref 4.0–10.5)
nRBC: 0 % (ref 0.0–0.2)

## 2019-07-09 MED ORDER — TRAMADOL HCL 50 MG PO TABS
50.0000 mg | ORAL_TABLET | Freq: Once | ORAL | Status: AC
  Administered 2019-07-09: 20:00:00 50 mg via ORAL
  Filled 2019-07-09: qty 1

## 2019-07-09 MED ORDER — SODIUM CHLORIDE 0.9% FLUSH: 3.0000 mL | Freq: Once | INTRAVENOUS | Status: DC

## 2019-07-09 MED ORDER — CEPHALEXIN 500 MG PO CAPS: 500.0000 mg | ORAL_CAPSULE | Freq: Two times a day (BID) | ORAL | 0 refills | Status: AC

## 2019-07-09 MED ORDER — CEPHALEXIN 500 MG PO CAPS
500.0000 mg | ORAL_CAPSULE | Freq: Once | ORAL | Status: AC
  Administered 2019-07-09: 500 mg via ORAL
  Filled 2019-07-09: qty 1

## 2019-07-09 NOTE — ED Notes (Signed)
Pt to CT

## 2019-07-09 NOTE — ED Provider Notes (Signed)
[facility] Emergency Department Provider Note ____________________________________________  Time seen: Approximately 5:27 PM  I have reviewed the triage vital signs and the nursing notes.   HISTORY  Chief Complaint Headache and Dizziness   HPI [name] is a 84 y.o. female who presents to the emergency department for treatment and evaluation of headache.   Is also experienced some dizziness when bending over. Symptoms started 5 days ago.  Location: [place] Similar to previous headaches: no Duration: intermittent TIMING: worse with bending over SEVERITY: 7/10 QUALITY: ache, throb CONTEXT: no known exposure MODIFYING FACTORS: no relief with Tylenol ASSOCIATED SYMPTOMS: none Past Medical History:  Diagnosis Date  . Arthritis    lower back, knees  . Breast cancer ([place]) 2011   RT LUMPECTOMY  . Chemotherapy follow-up examination 2011   RT BREAST CANCER  . Cirrhosis ([place])   . Personal history of chemotherapy   . Personal history of radiation therapy   . Radiation 2011   RT BREAST CANCER  . Skin cancer   . Wears dentures    full upper, partial lower    Patient Active Problem List   Diagnosis Date Noted  . Breast cancer ([place]) 11/02/2014  . Breast CA ([place]) 01/19/2013    Past Surgical History:  Procedure Laterality Date  . BREAST BIOPSY Right 2014   RT CORE W/CLIP - NEG  . BREAST EXCISIONAL BIOPSY Right 2011   breast ca  . CATARACT EXTRACTION W/PHACO Right 04/15/2016   Procedure: CATARACT EXTRACTION PHACO AND INTRAOCULAR LENS PLACEMENT (IOC);  Surgeon: Leandrew Koyanagi, MD;  Location: [place];  Service: Ophthalmology;  Laterality: Right;  . CATARACT EXTRACTION W/PHACO Left 07/01/2016   Procedure: CATARACT EXTRACTION PHACO AND INTRAOCULAR LENS PLACEMENT (IOC);  Surgeon: Leandrew Koyanagi, MD;  Location: [place];  Service: Ophthalmology;  Laterality: Left;  Left eye  . CHOLECYSTECTOMY      Prior to Admission  medications   Medication Sig Start Date End Date Taking? Authorizing Provider  cephALEXin (KEFLEX) 500 MG capsule Take 1 capsule (500 mg total) by mouth 2 (two) times daily for 7 days. 07/09/19 07/16/19  Boubacar Lerette, Dessa Phi, FNP  methocarbamol (ROBAXIN) 500 MG tablet Take 1 tablet (500 mg total) by mouth at bedtime as needed for muscle spasms. 11/30/17   Cuthriell, Charline Bills, PA-C  predniSONE (DELTASONE) 10 MG tablet Take 1 tablet (10 mg total) by mouth daily. 11/30/17   Cuthriell, Charline Bills, PA-C  Pseudoephedrine-Guaifenesin (MUCINEX D PO) Take by mouth as needed.    [provider]  traMADol (ULTRAM) 50 MG tablet Take 1 tablet (50 mg total) by mouth every 6 (six) hours as needed. 11/30/17   Cuthriell, Charline Bills, PA-C    Allergies Gabapentin and Ivp dye [iodinated diagnostic agents]  Family History  Problem Relation Age of Onset  . Cancer Father        prostate  . Cancer Brother        prostate  . Breast cancer Neg Hx     Social History Social History   Tobacco Use  . Smoking status: Current Every Day Smoker    Packs/day: 0.50    Types: Cigarettes    Last attempt to quit: 04/28/2014    Years since quitting: 5.2  . Smokeless tobacco: Former Network engineer Use Topics  . Alcohol use: No    Alcohol/week: 0.0 standard drinks    Comment: occasional glass of wine  . Drug use: No    Review of Systems Constitutional: No  fever/chills or recent injury. Eyes: No visual changes. ENT: No sore throat. Respiratory: Denies shortness of breath. Gastrointestinal: No abdominal pain.  No nausea, no vomiting.  No diarrhea.  No constipation. Musculoskeletal: Negative for pain. Skin: Negative for rash. Neurological:Positive for headache, negative for focal weakness or numbness. No confusion or fainting. ___________________________________________   PHYSICAL EXAM:  VITAL SIGNS: ED Triage Vitals  Enc Vitals Group     BP 07/09/19 1547 (!) 151/83     Pulse Rate 07/09/19 1547 90      Resp 07/09/19 1547 18     Temp 07/09/19 1547 98.1 F (36.7 C)     Temp src --      SpO2 07/09/19 1547 98 %     Weight 07/09/19 1542 160 lb (72.6 kg)     Height 07/09/19 1542 5\' 2"  (1.575 m)     Head Circumference --      Peak Flow --      Pain Score 07/09/19 1542 6     Pain Loc --      Pain Edu? --      Excl. in [place]? --     Constitutional: Alert and oriented. Well appearing and in no acute distress. Eyes: Conjunctivae are normal. PERRL. EOMI without expressed pain. No evidence of papilledema on limited exam. Head: Atraumatic. Nose: No congestion/rhinnorhea. Mouth/Throat: Mucous membranes are moist.  Oropharynx non-erythematous. Neck: No stridor. Supple, no meningismus.  Cardiovascular: Normal rate, regular rhythm. Grossly normal heart sounds.  Good peripheral circulation. Respiratory: Normal respiratory effort.  No retractions. Lungs CTAB. Gastrointestinal: Soft and nontender. No distention.  Musculoskeletal: No lower extremity tenderness nor edema.  No joint effusions. Neurologic:  Normal speech and language. No gross focal neurologic deficits are appreciated. No gait instability. Cranial nerves: 2-10 normal as tested. Cerebellar:Normal Romberg, finger-nose-finger, heel to shin, normal gait. Sensorimotor: No aphasia, pronator drift, clonus, sensory loss or abnormal reflexes.  Skin:  Skin is warm, dry and intact. No rash noted. Psychiatric: Mood and affect are normal. Speech and behavior are normal. Normal thought process and cognition.  ____________________________________________   LABS (all labs ordered are listed, but only abnormal results are displayed)  Labs Reviewed  BASIC METABOLIC PANEL - Abnormal; Notable for the following components:      Result Value   Glucose, Bld 164 (*)    BUN 34 (*)    All other components within normal limits  CBC - Abnormal; Notable for the following components:   Hemoglobin 15.5 (*)    HCT 47.8 (*)    All other components within normal  limits  URINALYSIS, COMPLETE (UACMP) WITH MICROSCOPIC - Abnormal; Notable for the following components:   Color, Urine YELLOW (*)    APPearance HAZY (*)    Leukocytes,Ua TRACE (*)    Bacteria, UA FEW (*)    All other components within normal limits  SARS CORONAVIRUS 2 (TAT 6-24 HRS)   ____________________________________________  EKG  Not indicated. ____________________________________________  RADIOLOGY  CT Head Wo Contrast  Result Date: 07/09/2019 CLINICAL DATA:  Headache EXAM: CT HEAD WITHOUT CONTRAST TECHNIQUE: Contiguous axial images were obtained from the base of the skull through the vertex without intravenous contrast. COMPARISON:  11/18/2010 FINDINGS: Brain: Mild chronic small vessel disease throughout the deep white matter. No acute intracranial abnormality. Specifically, no hemorrhage, hydrocephalus, mass lesion, acute infarction, or significant intracranial injury. Vascular: No hyperdense vessel or unexpected calcification. Skull: No acute calvarial abnormality. Sinuses/Orbits: Visualized paranasal sinuses and mastoids clear. Orbital soft tissues unremarkable. Other: None IMPRESSION:  Chronic small vessel disease. No acute intracranial abnormality. Electronically Signed   By: Rolm Baptise M.D.   On: 07/09/2019 18:55   ____________________________________________   PROCEDURES  Procedure(s) performed:  Procedures  Critical Care performed: no ____________________________________________   INITIAL IMPRESSION / ASSESSMENT AND PLAN / ED COURSE  84 year old female presenting to the emergency department for treatment and evaluation of headache and dizziness has been ongoing for the past 5 days.  Patient also notes that she has had an altered sense of taste but denies an altered sense of smell.  No known exposure to COVID-19.  She took some Tylenol without much relief.  She states that rest has provided some relief.  She denies having had a headache like this in the  past.  Neuro exam is reassuring.  Patient was observed ambulating through the department without assistance.  Gait steady.  CT scan of the head is negative for any acute findings.  Urinalysis does show some leukocytes, white cells with few bacteria and red blood cells.  Patient states that she has noticed that her urine has been darker than usual but denies any dysuria.  Patient did mention that she does have an altered sense of taste.  COVID-19 screening will be completed prior to discharge.  She will be treated with some Keflex for findings concerning for UTI in the presence of dizziness and headache.  She will be discharged home to follow-up with her primary care provider for symptoms that are not improving over the next 24 hours or so.  She was encouraged to return to the emergency department for symptoms of change or worsen if she is unable to schedule an appointment.  Pertinent labs & imaging results that were available during my care of the patient were reviewed by me and considered in my medical decision making (see chart for details). ____________________________________________   FINAL CLINICAL IMPRESSION(S) / ED DIAGNOSES  Final diagnoses:  Dysgeusia  Acute intractable headache, unspecified headache type  Acute cystitis without hematuria    ED Discharge Orders         Ordered    cephALEXin (KEFLEX) 500 MG capsule  2 times daily     07/09/19 2006           Victorino Dike, FNP 07/09/19 2021    Earleen Newport, MD 07/09/19 2213

## 2019-07-09 NOTE — Discharge Instructions (Signed)
Please follow up with your primary care provider for symptoms that are not improving over the next day or so.  Return to the ER for symptoms that change or worsen if unable to schedule an appointment.  Your COVID screening will be back in 1-2 days. If positive, someone will call you.

## 2019-07-09 NOTE — ED Triage Notes (Addendum)
Pt comes via POV from home with c/o headache and some dizziness. Pt states this started last Tuesday.  Pt went to UC but was referred here. Pt states some diarrhea. Pt states 7/10 pain. Pt states when it comes it comes like a wam.  Pt denies any blurred vision. Pt states she does feel off balance. Pt states it is worse when she goes to sit up and then she has to wait a few minutes before she can walk.

## 2019-07-09 NOTE — ED Notes (Signed)
Pt may drink per EDP. Pt requesting coffee, pt given coffee. Family updated.

## 2019-07-09 NOTE — ED Triage Notes (Signed)
First RN Note: pt presents to ED via POV with c/o HA since Tuesday. Pt states took Tylenol, Advil, and Alleve, pt denies relief with medications. Pt c/o intermittent dizziness, states at approx 1300 today was having dizziness, denies dizziness at this time. Pt with clear speech noted on arrival to ED, pt ambulatory without difficulty at this time across the lobby.

## 2019-07-09 NOTE — ED Notes (Signed)
No Iv access needed  at this time per EDP

## 2019-07-10 LAB — SARS CORONAVIRUS 2 (TAT 6-24 HRS): SARS Coronavirus 2: NEGATIVE

## 2019-12-25 ENCOUNTER — Other Ambulatory Visit: Payer: Self-pay

## 2019-12-25 ENCOUNTER — Emergency Department
Admission: EM | Admit: 2019-12-25 | Discharge: 2019-12-25 | Disposition: A | Discharge status: Home / Self Care | Payer: Medicare Other | Attending: Emergency Medicine | Admitting: Emergency Medicine

## 2019-12-25 ENCOUNTER — Emergency Department: Payer: Medicare Other

## 2019-12-25 DIAGNOSIS — Z853 Personal history of malignant neoplasm of breast: Secondary | ICD-10-CM | POA: Insufficient documentation

## 2019-12-25 DIAGNOSIS — Y999 Unspecified external cause status: Secondary | ICD-10-CM | POA: Diagnosis not present

## 2019-12-25 DIAGNOSIS — M25572 Pain in left ankle and joints of left foot: Secondary | ICD-10-CM | POA: Diagnosis not present

## 2019-12-25 DIAGNOSIS — Y939 Activity, unspecified: Secondary | ICD-10-CM | POA: Insufficient documentation

## 2019-12-25 DIAGNOSIS — Z923 Personal history of irradiation: Secondary | ICD-10-CM | POA: Diagnosis not present

## 2019-12-25 DIAGNOSIS — F17210 Nicotine dependence, cigarettes, uncomplicated: Secondary | ICD-10-CM | POA: Diagnosis not present

## 2019-12-25 DIAGNOSIS — Z9221 Personal history of antineoplastic chemotherapy: Secondary | ICD-10-CM | POA: Diagnosis not present

## 2019-12-25 DIAGNOSIS — I10 Essential (primary) hypertension: Secondary | ICD-10-CM | POA: Insufficient documentation

## 2019-12-25 DIAGNOSIS — Z79899 Other long term (current) drug therapy: Secondary | ICD-10-CM | POA: Insufficient documentation

## 2019-12-25 DIAGNOSIS — M79605 Pain in left leg: Secondary | ICD-10-CM

## 2019-12-25 DIAGNOSIS — S81812A Laceration without foreign body, left lower leg, initial encounter: Secondary | ICD-10-CM | POA: Insufficient documentation

## 2019-12-25 DIAGNOSIS — W228XXA Striking against or struck by other objects, initial encounter: Secondary | ICD-10-CM | POA: Insufficient documentation

## 2019-12-25 DIAGNOSIS — Y929 Unspecified place or not applicable: Secondary | ICD-10-CM | POA: Diagnosis not present

## 2019-12-25 MED ORDER — BACITRACIN ZINC 500 UNIT/GM EX OINT
TOPICAL_OINTMENT | Freq: Once | CUTANEOUS | Status: AC
  Administered 2019-12-25: 1 via TOPICAL
  Filled 2019-12-25: qty 2.7

## 2019-12-25 MED ORDER — ACETAMINOPHEN 500 MG PO TABS
1000.0000 mg | ORAL_TABLET | Freq: Once | ORAL | Status: AC
  Administered 2019-12-25: 1000 mg via ORAL
  Filled 2019-12-25: qty 2

## 2019-12-25 MED ORDER — LIDOCAINE-EPINEPHRINE 1 %-1:100000 IJ SOLN
30.0000 mL | Freq: Once | INTRAMUSCULAR | Status: AC
  Administered 2019-12-25: 30 mL via INTRADERMAL
  Filled 2019-12-25: qty 1

## 2019-12-25 MED ORDER — ONDANSETRON 4 MG PO TBDP: 4.0000 mg | ORAL_TABLET | Freq: Once | ORAL | Status: AC

## 2019-12-25 MED ORDER — ONDANSETRON 4 MG PO TBDP
ORAL_TABLET | ORAL | Status: AC
  Administered 2019-12-25: 4 mg via ORAL
  Filled 2019-12-25: qty 1

## 2019-12-25 MED ORDER — OXYCODONE HCL 5 MG PO TABS
5.0000 mg | ORAL_TABLET | Freq: Once | ORAL | Status: AC
  Administered 2019-12-25: 5 mg via ORAL
  Filled 2019-12-25: qty 1

## 2019-12-25 NOTE — ED Triage Notes (Signed)
Pt reports was cut on her left leg with a car jack. Pt with large laceration to her left leg.

## 2019-12-25 NOTE — ED Provider Notes (Signed)
[facility] Emergency Department Provider Note ____________________________________________   First MD Initiated Contact with Patient 12/25/19 1845     (approximate)  I have reviewed the triage vital signs and the nursing notes.  HISTORY  Chief Complaint Laceration   HPI [name] is a 84 y.o. female with an accidental leg laceration.    Patient reports a history of hypertension, for which she does not take medications.  Denies additional medical history.  Reports she had a tetanus booster within the last 2 years.  No recent or preceding illnesses.  Patient reports a car jack, that did not have a car on top of it, tipped over and fell onto her left lower leg causing injury.  She reports immediate 8/10 intensity pain and bleeding that filled her shoe.  Pain is aching in nature and constant.  She did not take pain medications prior to arrival.  Patient reports a separate injury caused by a dog stepping on her left ankle that occurred about 3-4 days ago.  She is concerned that the carjack, on its way to the ground, may have "nicked this area."   Of note, patient reports having foot drop on this affected side, her left foot, for the past 2-3 years from an unknown injury.  She reports she at baseline is unable to dorsiflex her left foot, and frequently trips on her "left foot that is dragging behind me."  Past Medical History:  Diagnosis Date  . Arthritis    lower back, knees  . Breast cancer ([place]) 2011   RT LUMPECTOMY  . Chemotherapy follow-up examination 2011   RT BREAST CANCER  . Cirrhosis ([place])   . Personal history of chemotherapy   . Personal history of radiation therapy   . Radiation 2011   RT BREAST CANCER  . Skin cancer   . Wears dentures    full upper, partial lower    Patient Active Problem List   Diagnosis Date Noted  . Breast cancer ([place]) 11/02/2014  . Breast CA ([place]) 01/19/2013    Past Surgical History:  Procedure Laterality  Date  . BREAST BIOPSY Right 2014   RT CORE W/CLIP - NEG  . BREAST EXCISIONAL BIOPSY Right 2011   breast ca  . CATARACT EXTRACTION W/PHACO Right 04/15/2016   Procedure: CATARACT EXTRACTION PHACO AND INTRAOCULAR LENS PLACEMENT (IOC);  Surgeon: Leandrew Koyanagi, MD;  Location: [place];  Service: Ophthalmology;  Laterality: Right;  . CATARACT EXTRACTION W/PHACO Left 07/01/2016   Procedure: CATARACT EXTRACTION PHACO AND INTRAOCULAR LENS PLACEMENT (IOC);  Surgeon: Leandrew Koyanagi, MD;  Location: [place];  Service: Ophthalmology;  Laterality: Left;  Left eye  . CHOLECYSTECTOMY      Prior to Admission medications   Medication Sig Start Date End Date Taking? Authorizing Provider  methocarbamol (ROBAXIN) 500 MG tablet Take 1 tablet (500 mg total) by mouth at bedtime as needed for muscle spasms. 11/30/17   Cuthriell, Charline Bills, PA-C  predniSONE (DELTASONE) 10 MG tablet Take 1 tablet (10 mg total) by mouth daily. 11/30/17   Cuthriell, Charline Bills, PA-C  Pseudoephedrine-Guaifenesin (MUCINEX D PO) Take by mouth as needed.    [provider]  traMADol (ULTRAM) 50 MG tablet Take 1 tablet (50 mg total) by mouth every 6 (six) hours as needed. 11/30/17   Cuthriell, Charline Bills, PA-C    Allergies Gabapentin and Ivp dye [iodinated diagnostic agents]  Family History  Problem Relation Age of Onset  . Cancer Father  prostate  . Cancer Brother        prostate  . Breast cancer Neg Hx     Social History Social History   Tobacco Use  . Smoking status: Current Every Day Smoker    Packs/day: 0.50    Types: Cigarettes    Last attempt to quit: 04/28/2014    Years since quitting: 5.6  . Smokeless tobacco: Former Network engineer Use Topics  . Alcohol use: No    Alcohol/week: 0.0 standard drinks    Comment: occasional glass of wine  . Drug use: No    Review of Systems  Constitutional: No fever/chills Eyes: No visual changes. ENT: No sore  throat. Cardiovascular: Denies chest pain. Respiratory: Denies shortness of breath. Gastrointestinal: No abdominal pain.  No nausea, no vomiting.  No diarrhea.  No constipation. Genitourinary: Negative for dysuria. Musculoskeletal: Negative for back pain.  Positive for leg injury and pain. Skin: Negative for rash. Neurological: Negative for headaches, focal weakness or numbness.   ____________________________________________   PHYSICAL EXAM:  VITAL SIGNS: ED Triage Vitals  Enc Vitals Group     BP 12/25/19 1835 (!) 144/97     Pulse Rate 12/25/19 1835 100     Resp 12/25/19 1835 18     Temp 12/25/19 1835 98.2 F (36.8 C)     Temp Source 12/25/19 1835 Oral     SpO2 12/25/19 1835 97 %     Weight 12/25/19 1833 165 lb (74.8 kg)     Height 12/25/19 1833 5\' 8"  (1.727 m)     Head Circumference --      Peak Flow --      Pain Score 12/25/19 1833 10     Pain Loc --      Pain Edu? --      Excl. in [place]? --      Constitutional: Alert and oriented. Well appearing and in no acute distress. Eyes: Conjunctivae are normal. PERRL. EOMI. Head: Atraumatic. Nose: No congestion/rhinnorhea. Mouth/Throat: Mucous membranes are moist.  Oropharynx non-erythematous. Neck: No stridor. No cervical spine tenderness to palpation. Cardiovascular: Normal rate, regular rhythm. Grossly normal heart sounds.  Good peripheral circulation. Respiratory: Normal respiratory effort.  No retractions. Lungs CTAB. Gastrointestinal: Soft , nondistended, nontender to palpation. No abdominal bruits. No CVA tenderness. Musculoskeletal:  No joint effusions.  6 x 8 cm avulsed skin, attached to the inferior margin of the wound with exposed underlying fat.  No muscle belly or tendons exposed on my initial evaluation. Sterile examination prior to repair confirms that there is no muscle belly or tendons exposed.  Soft calf compartments. 1 x 1 cm healing laceration to the dorsal aspect of her ankle, that she reports is from a dog  stepping on her, as indicated above HPI. LLE is distally neurovascularly intact, beyond her known foot drop as described separately. Neurologic:  Normal speech and language. No gross focal neurologic deficits are appreciated. No gait instability noted. Left foot drop is present at baseline, per the patient and her husband. Skin:  Skin is warm, dry and intact. No rash noted. Psychiatric: Mood and affect are normal. Speech and behavior are normal.  ____________________________________________   LABS (all labs ordered are listed, but only abnormal results are displayed)  Labs Reviewed - No data to display ____________________________________________  12 Lead EKG   ____________________________________________  RADIOLOGY  ED MD interpretation: X-ray reviewed, ordered and assessment of foreign body or underlying fracture, and without evidence of fracture or retained foreign body.  Official radiology  report(s): DG Tibia/Fibula Left  Result Date: 12/25/2019 CLINICAL DATA:  84 year old female lacerated left leg on car Barnabas Lister. EXAM: LEFT TIBIA AND FIBULA - 2 VIEW COMPARISON:  Left ankle series today. FINDINGS: Medial mid tibia level soft tissue injury with architectural distortion. Only trace soft tissue gas. No radiopaque foreign body identified. Bone mineralization is within normal limits for age. Preserved alignment at the left knee and ankle. Mild for age degenerative changes. No acute osseous abnormality identified. IMPRESSION: 1. Medial soft tissue injury at the mid tibia level. No radiopaque foreign body identified. 2.  No acute osseous abnormality identified. Electronically Signed   By: Genevie Ann M.D.   On: 12/25/2019 19:27   DG Ankle 2 Views Left  Result Date: 12/25/2019 CLINICAL DATA:  84 year old female lacerated left leg on car Barnabas Lister. EXAM: LEFT ANKLE - 2 VIEW COMPARISON:  None. FINDINGS: Two views of the left ankle. Mortise joint alignment preserved. Talar dome intact. No evidence of  joint effusion. No acute osseous abnormality identified. Degenerative spurring at the calcaneus. Medial mid left tibia level soft tissue injury. Possible acute soft tissue swelling at the anterior ankle. No soft tissue gas. No radiopaque foreign body identified. IMPRESSION: Soft tissue injury. No acute fracture or dislocation identified about the left ankle. Electronically Signed   By: Genevie Ann M.D.   On: 12/25/2019 19:26    ____________________________________________   PROCEDURES  Procedure(s) performed (including Critical Care):  Marland KitchenMarland KitchenLaceration Repair  Date/Time: 12/25/2019 8:27 PM Performed by: Vladimir Crofts, MD Authorized by: Vladimir Crofts, MD   Consent:    Consent obtained:  Verbal   Consent given by:  Patient and spouse   Risks discussed:  Infection, pain, need for additional repair, poor cosmetic result and poor wound healing   Alternatives discussed:  No treatment Anesthesia (see MAR for exact dosages):    Anesthesia method:  Local infiltration   Local anesthetic:  Lidocaine 1% WITH epi Laceration details:    Location:  Leg   Leg location:  L lower leg   Length (cm):  8 Repair type:    Repair type:  Intermediate Exploration:    Hemostasis achieved with:  Direct pressure   Wound exploration: wound explored through full range of motion     Contaminated: no   Treatment:    Area cleansed with:  Betadine   Amount of cleaning:  Extensive   Irrigation solution:  Sterile saline   Irrigation volume:  1032ml   Irrigation method:  Pressure wash   Visualized foreign bodies/material removed: no   Subcutaneous repair:    Suture size:  4-0   Wound subcutaneous closure material used: monocryl.   Suture technique:  Simple interrupted   Number of sutures:  2 Skin repair:    Repair method:  Sutures   Suture size:  3-0   Suture material:  Nylon   Suture technique:  Horizontal mattress and simple interrupted   Number of sutures:  6 Approximation:    Approximation:   Loose Post-procedure details:    Dressing:  Antibiotic ointment, non-adherent dressing and adhesive bandage   Patient tolerance of procedure:  Tolerated well, no immediate complications   ____________________________________________   INITIAL IMPRESSION / ASSESSMENT AND PLAN / ED COURSE  Pleasant 84 year old woman presenting with accidental left lower leg laceration amenable to bedside repair and follow-up with the wound care clinic.  Hemodynamically stable and normal vital signs on room air.  Exam with isolated injury to her left medial lower leg, caused by a single striking  accidental injury from a falling car jack.  She reports having foot drop of this affected extremity at baseline, and she has no evidence of further neurovascular compromise beyond this.  Sterile examination without evidence of tendon rupture.  The wound extends into subcutaneous fat only.  Sterilely cleaned and repaired to the bedside, as above.  Underlying Monocryl to reapproximate underlying fat, and nonabsorbable nylon on the skin.  She has very thin remaining skin that may not be viable, but after thorough cleaning I kept this for further protection and possibility of viability.  Thoroughly advised the patient on outpatient management with cleaning precautions, nonadherent dressings and follow-up with wound care.  Return precautions for the ED were discussed.  Patient is medically stable for discharge home.  Clinical Course as of Dec 25 2027  Mon Dec 25, 2019  1930 Ring block around the wound applied with 20 cc of lidocaine with epinephrine.   [DS]    Clinical Course User Index [DS] Vladimir Crofts, MD     ____________________________________________   FINAL CLINICAL IMPRESSION(S) / ED DIAGNOSES  Final diagnoses:  Laceration of left lower extremity, initial encounter  Left leg pain  Acute left ankle pain     ED Discharge Orders    None       Phyillis Dascoli   Note:  This document was prepared using  Dragon voice recognition software and may include unintentional dictation errors.   Vladimir Crofts, MD 12/25/19 2033

## 2019-12-25 NOTE — Discharge Instructions (Addendum)
  In general, keep the area clean and dry.  You may gently wash with warm soap and water once daily, or more frequently if it gets dirty.  Once dry, apply bacitracin antibiotic ointment throughout the wound. Apply a nonadherent dressing/bandage such as Telfa Wrap loosely and Kerlix or Ace bandage   Please follow-up with the wound care clinic, the number's attached.  Use Tylenol for pain, 1000 mg at a time, up to 4 times per day.  Do not use more than 4000 mg of Tylenol in 24 hours.  If you develop any fevers, unbearable pain, pus coming from this wound, please return to the ED.

## 2020-01-17 ENCOUNTER — Encounter: Payer: Medicare Other | Attending: Internal Medicine | Admitting: Internal Medicine

## 2020-01-17 ENCOUNTER — Other Ambulatory Visit: Payer: Self-pay

## 2020-01-17 DIAGNOSIS — J449 Chronic obstructive pulmonary disease, unspecified: Secondary | ICD-10-CM | POA: Insufficient documentation

## 2020-01-17 DIAGNOSIS — K7460 Unspecified cirrhosis of liver: Secondary | ICD-10-CM | POA: Diagnosis not present

## 2020-01-17 DIAGNOSIS — I87323 Chronic venous hypertension (idiopathic) with inflammation of bilateral lower extremity: Secondary | ICD-10-CM | POA: Insufficient documentation

## 2020-01-17 DIAGNOSIS — M21372 Foot drop, left foot: Secondary | ICD-10-CM | POA: Insufficient documentation

## 2020-01-17 DIAGNOSIS — L97822 Non-pressure chronic ulcer of other part of left lower leg with fat layer exposed: Secondary | ICD-10-CM | POA: Diagnosis not present

## 2020-01-17 DIAGNOSIS — I872 Venous insufficiency (chronic) (peripheral): Secondary | ICD-10-CM | POA: Insufficient documentation

## 2020-01-17 DIAGNOSIS — F17200 Nicotine dependence, unspecified, uncomplicated: Secondary | ICD-10-CM | POA: Insufficient documentation

## 2020-01-17 DIAGNOSIS — S81812D Laceration without foreign body, left lower leg, subsequent encounter: Secondary | ICD-10-CM | POA: Insufficient documentation

## 2020-01-17 DIAGNOSIS — Z853 Personal history of malignant neoplasm of breast: Secondary | ICD-10-CM | POA: Insufficient documentation

## 2020-01-17 DIAGNOSIS — G4700 Insomnia, unspecified: Secondary | ICD-10-CM | POA: Diagnosis not present

## 2020-01-17 DIAGNOSIS — X58XXXD Exposure to other specified factors, subsequent encounter: Secondary | ICD-10-CM | POA: Insufficient documentation

## 2020-01-17 DIAGNOSIS — L97921 Non-pressure chronic ulcer of unspecified part of left lower leg limited to breakdown of skin: Secondary | ICD-10-CM | POA: Insufficient documentation

## 2020-01-19 NOTE — Progress Notes (Signed)
[name], [name] ([id number]) Visit Report for 01/17/2020 Abuse/Suicide Risk Screen Details Patient Name: [name], [name]. Date of Service: 01/17/2020 9:15 AM Medical Record Number: [id number] Patient Account Number: [id number] Date of Birth/Sex: [birth year] (84 y.o. F) Treating RN: Cornell Barman Primary Care Sandralee Tarkington: Otilio Miu Other Clinician: Referring Brieanna Nau: [name] Treating Brentlee Delage/Extender: Tito Dine in Treatment: 0 Abuse/Suicide Risk Screen Items Answer ABUSE RISK SCREEN: Has anyone close to you tried to hurt or harm you recentlyo No Do you feel uncomfortable with anyone in your familyo No Has anyone forced you do things that you didnot want to doo No Electronic Signature(s) Signed: 01/18/2020 10:49:47 AM By: Darci Needle Signed: 01/18/2020 6:39:29 PM By: Gretta Cool, BSN, RN, CWS, Kim RN, BSN Entered By: Darci Needle on 01/17/2020 10:04:26 [name] ([id number]) -------------------------------------------------------------------------------- Activities of Daily Living Details Patient Name: [name]. Date of Service: 01/17/2020 9:15 AM Medical Record Number: [id number] Patient Account Number: [id number] Date of Birth/Sex: 1935-11-13 (84 y.o. F) Treating RN: Cornell Barman Primary Care Halston Kintz: Otilio Miu Other Clinician: Referring Michaeal Davis: [name] Treating Rylen Hou/Extender: Tito Dine in Treatment: 0 Activities of Daily Living Items Answer Activities of Daily Living (Please select one for each item) Drive Automobile Completely Able Take Medications Completely Able Use Telephone Completely Able Care for Appearance Completely Able Use Toilet Completely Able Bath / Shower Completely Able Dress Self Completely Able Feed Self Completely Able Walk Completely Able Get In / Out Bed Completely Able Housework Completely Able Prepare Meals Completely Able Handle Money Completely Able Shop for Self Completely  Able Electronic Signature(s) Signed: 01/18/2020 10:49:47 AM By: Darci Needle Signed: 01/18/2020 6:39:29 PM By: Gretta Cool, BSN, RN, CWS, Kim RN, BSN Entered By: Darci Needle on 01/17/2020 10:05:00 [name] ([id number]) -------------------------------------------------------------------------------- Education Screening Details Patient Name: [name]. Date of Service: 01/17/2020 9:15 AM Medical Record Number: [id number] Patient Account Number: [id number] Date of Birth/Sex: January 16, 1936 (84 y.o. F) Treating RN: Cornell Barman Primary Care Cerys Winget: Otilio Miu Other Clinician: Referring Jazlen Ogarro: [name] Treating Melanee Cordial/Extender: Tito Dine in Treatment: 0 Learning Preferences/Education Level/Primary Language Highest Education Level: High School Preferred Language: English Cognitive Barrier Language Barrier: No Translator Needed: No Memory Deficit: No Emotional Barrier: No Cultural/Religious Beliefs Affecting Medical Care: No Physical Barrier Impaired Vision: No Impaired Hearing: No Decreased Hand dexterity: No Knowledge/Comprehension Knowledge Level: High Comprehension Level: High Ability to understand written instructions: High Ability to understand verbal instructions: High Motivation Anxiety Level: Calm Cooperation: Cooperative Education Importance: Acknowledges Need Interest in Health Problems: Asks Questions Perception: Coherent Willingness to Engage in Self-Management High Activities: Readiness to Engage in Self-Management High Activities: Electronic Signature(s) Signed: 01/18/2020 10:49:47 AM By: Darci Needle Signed: 01/18/2020 6:39:29 PM By: Gretta Cool, BSN, RN, CWS, Kim RN, BSN Entered By: Darci Needle on 01/17/2020 10:05:38 [name] ([id number]) -------------------------------------------------------------------------------- Fall Risk Assessment Details Patient Name: [name]. Date of Service: 01/17/2020 9:15  AM Medical Record Number: [id number] Patient Account Number: [id number] Date of Birth/Sex: Jan 19, 1936 (84 y.o. F) Treating RN: Cornell Barman Primary Care Abdo Denault: Otilio Miu Other Clinician: Referring Tawonda Legaspi: [name] Treating Zacariah Belue/Extender: Tito Dine in Treatment: 0 Fall Risk Assessment Items Have you had 2 or more falls in the last 12 monthso 0 Yes Have you had any fall that resulted in injury in the last 12 monthso 0 No FALLS RISK SCREEN History of falling - immediate or within 3 months 25 Yes Secondary diagnosis (Do you have 2 or more medical diagnoseso) 15 Yes  Ambulatory aid None/bed rest/wheelchair/nurse 0 No Crutches/cane/walker 0 No Furniture 0 No Intravenous therapy Access/Saline/Heparin Lock 0 No Gait/Transferring Normal/ bed rest/ wheelchair 0 No Weak (short steps with or without shuffle, stooped but able to lift head while walking, may 0 No seek support from furniture) Impaired (short steps with shuffle, may have difficulty arising from chair, head down, impaired 0 No balance) Mental Status Oriented to own ability 0 Yes Electronic Signature(s) Signed: 01/18/2020 10:49:47 AM By: Darci Needle Signed: 01/18/2020 6:39:29 PM By: Gretta Cool, BSN, RN, CWS, Kim RN, BSN Entered By: Darci Needle on 01/17/2020 10:06:31 [name] ([id number]) -------------------------------------------------------------------------------- Foot Assessment Details Patient Name: [name]. Date of Service: 01/17/2020 9:15 AM Medical Record Number: [id number] Patient Account Number: [id number] Date of Birth/Sex: 1935-10-13 (84 y.o. F) Treating RN: Cornell Barman Primary Care Aaronjames Kelsay: Otilio Miu Other Clinician: Referring Cristofher Livecchi: [name] Treating Nychelle Cassata/Extender: Tito Dine in Treatment: 0 Foot Assessment Items Site Locations + = Sensation present, - = Sensation absent, C = Callus, U = Ulcer R = Redness, W = Warmth, M = Maceration, PU  = Pre-ulcerative lesion F = Fissure, S = Swelling, D = Dryness Assessment Right: Left: Other Deformity: No No Prior Foot Ulcer: No No Prior Amputation: No No Charcot Joint: No No Ambulatory Status: Ambulatory Without Help Gait: Steady Electronic Signature(s) Signed: 01/18/2020 10:49:47 AM By: Darci Needle Signed: 01/18/2020 6:39:29 PM By: Gretta Cool, BSN, RN, CWS, Kim RN, BSN Entered By: Darci Needle on 01/17/2020 10:07:13 [name] ([id number]) -------------------------------------------------------------------------------- Nutrition Risk Screening Details Patient Name: [name]. Date of Service: 01/17/2020 9:15 AM Medical Record Number: [id number] Patient Account Number: [id number] Date of Birth/Sex: 09-15-1935 (84 y.o. F) Treating RN: Cornell Barman Primary Care Westyn Keatley: Otilio Miu Other Clinician: Referring Chemere Steffler: [name] Treating Mikella Linsley/Extender: Tito Dine in Treatment: 0 Height (in): 65 Weight (lbs): 140 Body Mass Index (BMI): 23.3 Nutrition Risk Screening Items Score Screening NUTRITION RISK SCREEN: I have an illness or condition that made me change the kind and/or amount of food I eat 0 No I eat fewer than two meals per day 0 No I eat few fruits and vegetables, or milk products 0 No I have three or more drinks of beer, liquor or wine almost every day 0 No I have tooth or mouth problems that make it hard for me to eat 0 No I don't always have enough money to buy the food I need 0 No I eat alone most of the time 0 No I take three or more different prescribed or over-the-counter drugs a day 0 No Without wanting to, I have lost or gained 10 pounds in the last six months 0 No I am not always physically able to shop, cook and/or feed myself 0 No Nutrition Protocols Good Risk Protocol 0 No interventions needed Moderate Risk Protocol High Risk Proctocol Risk Level: Good Risk Score: 0 Electronic Signature(s) Signed: 01/18/2020  10:49:47 AM By: Darci Needle Signed: 01/18/2020 6:39:29 PM By: Gretta Cool, BSN, RN, CWS, Kim RN, BSN Entered By: Darci Needle on 01/17/2020 10:06:47

## 2020-01-19 NOTE — Progress Notes (Signed)
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 Fortunately we are not as bad today as it was back then. 5. She will be followed up in 1 week no evidence of infection was seen no cultures were done I spent 35 minutes in review of this patient's past medical history, face-to-face evaluation and preparation is Electronic Signature(s) Signed: 01/17/2020 4:48:02 PM By: Linton Ham MD Entered By: Linton Ham on 01/17/2020 10:35:06 [name] ([id number]) -------------------------------------------------------------------------------- ROS/PFSH Details Patient Name: [name]. Date of Service: 01/17/2020 9:15 AM Medical Record Number: [id number] Patient Account Number: [id number] Date of Birth/Sex: 02-26-1936 (84 y.o. F) Treating RN: Cornell Barman Primary Care Provider: Otilio Miu Other Clinician: Referring Provider: [name] Treating Provider/Extender: Tito Dine in Treatment: 0 Information Obtained  From Patient Eyes Complaints and Symptoms: Negative for: Dry Eyes; Vision Changes; Glasses / Contacts Ear/Nose/Mouth/Throat Complaints and Symptoms: Negative for: Difficult clearing ears; Sinusitis Hematologic/Lymphatic Complaints and Symptoms: Negative for: Bleeding / Clotting Disorders; Human Immunodeficiency Virus Respiratory Complaints and Symptoms: Negative for: Chronic or frequent coughs; Shortness of Breath Medical History: Past Medical History Notes: smoker Cardiovascular Complaints and Symptoms: Negative for: Chest pain; LE edema Gastrointestinal Complaints and Symptoms: Negative for: Frequent diarrhea; Nausea; Vomiting Endocrine Complaints and Symptoms: Negative for: Hepatitis; Thyroid disease; Polydypsia (Excessive Thirst) Genitourinary Complaints and Symptoms: Negative for: Kidney failure/ Dialysis; Incontinence/dribbling Immunological Complaints and Symptoms: Negative for: Hives; Itching Integumentary (Skin) Complaints and Symptoms: Positive for: Wounds - trauma to left lower leg; Swelling Negative for: Bleeding or bruising tendency; Breakdown Medical HistoryZALEA, PETE ([id number]) Negative for: History of Burn; History of pressure wounds Musculoskeletal Complaints and Symptoms: Negative for: Muscle Pain; Muscle Weakness Neurologic Complaints and Symptoms: Negative for: Numbness/parasthesias; Focal/Weakness Psychiatric Complaints and Symptoms: Negative for: Anxiety; Claustrophobia Oncologic Medical History: Negative for: Received Chemotherapy; Received Radiation Past Medical History Notes: R breast cancer - lumpectomy 2011 Immunizations Pneumococcal Vaccine: Received Pneumococcal Vaccination: No Immunization Notes: up to date Implantable Devices None Family and Social History Cancer: No; Diabetes: No; Heart Disease: No; Hereditary Spherocytosis: No; Hypertension: No; Kidney Disease: No; Lung Disease: No; Seizures: No; Stroke: No;  Thyroid Problems: No; Tuberculosis: No; Current every day smoker; Marital Status - Divorced; Alcohol Use: Never; Drug Use: No History; Caffeine Use: Daily; Financial Concerns: No; Food, Clothing or Shelter Needs: No; Support System Lacking: No; Transportation Concerns: No Electronic Signature(s) Signed: 01/17/2020 4:48:02 PM By: Linton Ham MD Signed: 01/18/2020 10:49:47 AM By: Darci Needle Signed: 01/18/2020 6:39:29 PM By: Gretta Cool, BSN, RN, CWS, Kim RN, BSN Entered By: Darci Needle on 01/17/2020 10:04:09 [name] ([id number]) -------------------------------------------------------------------------------- SuperBill Details Patient Name: [name]. Date of Service: 01/17/2020 Medical Record Number: [id number] Patient Account Number: [id number] Date of Birth/Sex: 1935-09-17 (84 y.o. F) Treating RN: Cornell Barman Primary Care  Provider: Otilio Miu Other Clinician: Referring Provider: [name] Treating Provider/Extender: Tito Dine in Treatment: 0 Diagnosis Coding ICD-10 Codes Code Description [phone] Laceration without foreign body, left lower leg, subsequent encounter L97.822 Non-pressure chronic ulcer of other part of left lower leg with fat layer exposed L97.921 Non-pressure chronic ulcer of unspecified part of left lower leg limited to breakdown of skin I87.323 Chronic venous hypertension (idiopathic) with inflammation of bilateral lower extremity Facility Procedures CPT4 Code: [id number] Description: 78588 - WOUND CARE VISIT-LEV 3 EST PT Modifier: Quantity: 1 CPT4 Code: 50277412 Description: 87867 - DEB SUBQ TISSUE 20 SQ CM/< Modifier: Quantity: 1 CPT4 Code: Description: ICD-10 Diagnosis Description L97.822 Non-pressure chronic ulcer of other part of left lower leg with fat layer Modifier: exposed Quantity: Physician Procedures CPT4 Code: 6720947 Description: 09628 - WC PHYS LEVEL 4 - NEW PT Modifier: 25 Quantity: 1 CPT4  Code: Description: ICD-10 Diagnosis Description S81.812D Laceration without foreign body, left lower leg, subsequent encounter L97.822 Non-pressure chronic ulcer of other part of left lower leg with fat layer L97.921 Non-pressure chronic ulcer of unspecified  part of left lower leg limited t I87.323 Chronic venous hypertension (idiopathic) with inflammation of bilateral lo Modifier: exposed o breakdown of skin wer extremity Quantity: CPT4 Code: 3662947 Description: 65465 - WC PHYS SUBQ TISS 20 SQ CM Modifier: Quantity: 1 CPT4 Code: Description: ICD-10 Diagnosis Description L97.822 Non-pressure chronic ulcer of other part of left lower leg with fat layer Modifier: exposed Quantity: Electronic Signature(s) Signed: 01/17/2020 4:48:02 PM By: Linton Ham MD Entered By: Linton Ham on 01/17/2020 10:35:50

## 2020-01-19 NOTE — Progress Notes (Signed)
ELYSA, WOMAC ([id number]) Visit Report for 01/17/2020 Allergy List Details Patient Name: [name], [name]. Date of Service: 01/17/2020 9:15 AM Medical Record Number: [id number] Patient Account Number: [id number] Date of Birth/Sex: March 27, 1936 (84 y.o. F) Treating RN: Cornell Barman Primary Care Blaise Grieshaber: Otilio Miu Other Clinician: Referring Analyn Matusek: [name] Treating Evett Kassa/Extender: Ricard Dillon Weeks in Treatment: 0 Allergies Active Allergies Iodinated Contrast- Oral and IV Dye Allergy Notes Electronic Signature(s) Signed: 01/18/2020 10:49:47 AM By: Darci Needle Entered By: Darci Needle on 01/17/2020 10:00:23 [name] ([id number]) -------------------------------------------------------------------------------- Arrival Information Details Patient Name: [name]. Date of Service: 01/17/2020 9:15 AM Medical Record Number: [id number] Patient Account Number: [id number] Date of Birth/Sex: 06-Jul-1935 (84 y.o. F) Treating RN: Cornell Barman Primary Care Reign Bartnick: Otilio Miu Other Clinician: Referring Masae Lukacs: [name] Treating Dallan Schonberg/Extender: Tito Dine in Treatment: 0 Visit Information Patient Arrived: Ambulatory Arrival Time: 09:50 Accompanied By: self Transfer Assistance: None Patient Identification Verified: Yes Secondary Verification Process Completed: Yes Patient Requires Transmission-Based Precautions: No Patient Has Alerts: No History Since Last Visit All ordered tests and consults were completed: No Added or deleted any medications: No Any new allergies or adverse reactions: No Had a fall or experienced change in activities of daily living that may affect risk of falls: No Signs or symptoms of abuse/neglect since last visito No Hospitalized since last visit: No Implantable device outside of the clinic excluding cellular tissue based products placed in the center since last visit: No Pain Present Now: No Electronic  Signature(s) Signed: 01/18/2020 10:49:47 AM By: Darci Needle Entered By: Darci Needle on 01/17/2020 09:51:43 [name] ([id number]) -------------------------------------------------------------------------------- Clinic Level of Care Assessment Details Patient Name: [name]. Date of Service: 01/17/2020 9:15 AM Medical Record Number: [id number] Patient Account Number: [id number] Date of Birth/Sex: [birth year] (84 y.o. F) Treating RN: Cornell Barman Primary Care Clemon Devaul: Otilio Miu Other Clinician: Referring Analuisa Tudor: [name] Treating Nazire Fruth/Extender: Tito Dine in Treatment: 0 Clinic Level of Care Assessment Items TOOL 1 Quantity Score []  - Use when EandM and Procedure is performed on INITIAL visit 0 ASSESSMENTS - Nursing Assessment / Reassessment X - General Physical Exam (combine w/ comprehensive assessment (listed just below) when performed on new 1 20 pt. evals) X- 1 25 Comprehensive Assessment (HX, ROS, Risk Assessments, Wounds Hx, etc.) ASSESSMENTS - Wound and Skin Assessment / Reassessment []  - Dermatologic / Skin Assessment (not related to wound area) 0 ASSESSMENTS - Ostomy and/or Continence Assessment and Care []  - Incontinence Assessment and Management 0 []  - 0 Ostomy Care Assessment and Management (repouching, etc.) PROCESS - Coordination of Care X - Simple Patient / Family Education for ongoing care 1 15 []  - 0 Complex (extensive) Patient / Family Education for ongoing care []  - 0 Staff obtains Programmer, systems, Records, Test Results / Process Orders []  - 0 Staff telephones HHA, Nursing Homes / Clarify orders / etc []  - 0 Routine Transfer to another Facility (non-emergent condition) []  - 0 Routine Hospital Admission (non-emergent condition) X- 1 15 New Admissions / Biomedical engineer / Ordering NPWT, Apligraf, etc. []  - 0 Emergency Hospital Admission (emergent condition) PROCESS - Special Needs []  - Pediatric / Minor  Patient Management 0 []  - 0 Isolation Patient Management []  - 0 Hearing / Language / Visual special needs []  - 0 Assessment of Community assistance (transportation, D/C planning, etc.) []  - 0 Additional assistance / Altered mentation []  - 0 Support Surface(s) Assessment (bed, cushion, seat, etc.) INTERVENTIONS - Miscellaneous []  - External ear  exam 0 []  - 0 Patient Transfer (multiple staff / Civil Service fast streamer / Similar devices) []  - 0 Simple Staple / Suture removal (25 or less) []  - 0 Complex Staple / Suture removal (26 or more) []  - 0 Hypo/Hyperglycemic Management (do not check if billed separately) X- 1 15 Ankle / Brachial Index (ABI) - do not check if billed separately Has the patient been seen at the hospital within the last three years: Yes Total Score: 90 Level Of Care: New/Established - Level 3 KYRSTEN, DELEEUW ([id number]) Electronic Signature(s) Signed: 01/18/2020 6:39:29 PM By: Gretta Cool, BSN, RN, CWS, Kim RN, BSN Entered By: Gretta Cool, BSN, RN, CWS, Kim on 01/17/2020 10:28:20 [name] ([id number]) -------------------------------------------------------------------------------- Encounter Discharge Information Details Patient Name: [name]. Date of Service: 01/17/2020 9:15 AM Medical Record Number: [id number] Patient Account Number: [id number] Date of Birth/Sex: July 07, 1935 (84 y.o. F) Treating RN: Cornell Barman Primary Care Love Milbourne: Otilio Miu Other Clinician: Referring Atley Scarboro: [name] Treating Sheena Donegan/Extender: Tito Dine in Treatment: 0 Encounter Discharge Information Items Post Procedure Vitals Discharge Condition: Stable Temperature (F): 98.2 Ambulatory Status: Ambulatory Pulse (bpm): 89 Discharge Destination: Home Respiratory Rate (breaths/min): 22 Transportation: Private Auto Blood Pressure (mmHg): 132/94 Accompanied By: self Schedule Follow-up Appointment: No Clinical Summary of Care: Electronic Signature(s) Signed: 01/18/2020  6:39:29 PM By: Gretta Cool, BSN, RN, CWS, Kim RN, BSN Entered By: Gretta Cool, BSN, RN, CWS, Kim on 01/17/2020 10:30:27 [name] ([id number]) -------------------------------------------------------------------------------- Lower Extremity Assessment Details Patient Name: [name]. Date of Service: 01/17/2020 9:15 AM Medical Record Number: [id number] Patient Account Number: [id number] Date of Birth/Sex: 07/30/1935 (84 y.o. F) Treating RN: Cornell Barman Primary Care Kasean Denherder: Otilio Miu Other Clinician: Referring Pranish Akhavan: [name] Treating Blaire Palomino/Extender: Tito Dine in Treatment: 0 Edema Assessment Assessed: [Left: Yes] [Right: No] Edema: [Left: Ye] [Right: s] Calf Left: Right: Point of Measurement: 25 cm From Medial Instep 34 cm cm Ankle Left: Right: Point of Measurement: 12 cm From Medial Instep 24 cm cm Vascular Assessment Pulses: Dorsalis Pedis Palpable: [Left:Yes] Posterior Tibial Palpable: [Left:Yes] Blood Pressure: Brachial: [Left:150] Ankle: [Left:Dorsalis Pedis: 158 1.05] Electronic Signature(s) Signed: 01/18/2020 6:39:29 PM By: Gretta Cool, BSN, RN, CWS, Kim RN, BSN Entered By: Gretta Cool, BSN, RN, CWS, Kim on 01/17/2020 10:27:36 [name] ([id number]) -------------------------------------------------------------------------------- Multi Wound Chart Details Patient Name: [name]. Date of Service: 01/17/2020 9:15 AM Medical Record Number: [id number] Patient Account Number: [id number] Date of Birth/Sex: [birth year] (84 y.o. F) Treating RN: Cornell Barman Primary Care Shelbee Apgar: Otilio Miu Other Clinician: Referring Abrar Bilton: [name] Treating Xayvion Shirah/Extender: Tito Dine in Treatment: 0 Vital Signs Height(in): 65 Pulse(bpm): 89 Weight(lbs): 140 Blood Pressure(mmHg): 132/94 Body Mass Index(BMI): 23 Temperature(F): 98.2 Respiratory Rate(breaths/min): 22 Photos: [N/A:N/A] Wound Location: Lower Leg N/A N/A Wounding  Event: Trauma N/A N/A Primary Etiology: Trauma, Other N/A N/A Date Acquired: 12/25/2019 N/A N/A Weeks of Treatment: 0 N/A N/A Wound Status: Open N/A N/A Measurements L x W x D (cm) 6x2.5x0.1 N/A N/A Area (cm) : 11.781 N/A N/A Volume (cm) : 1.178 N/A N/A Classification: Full Thickness Without Exposed N/A N/A Support Structures Exudate Amount: Medium N/A N/A Exudate Type: Serosanguineous N/A N/A Exudate Color: red, brown N/A N/A Granulation Amount: Small (1-33%) N/A N/A Granulation Quality: Red N/A N/A Necrotic Amount: Large (67-100%) N/A N/A Necrotic Tissue: Eschar, Adherent Slough N/A N/A Exposed Structures: Fat Layer (Subcutaneous Tissue) N/A N/A Exposed: Yes Fascia: No Tendon: No Muscle: No Joint: No Bone: No Epithelialization: Small (1-33%) N/A N/A Debridement: Debridement -  Excisional N/A N/A Pre-procedure Verification/Time 10:10 N/A N/A Out Taken: Tissue Debrided: Subcutaneous N/A N/A Level: Skin/Subcutaneous Tissue N/A N/A Debridement Area (sq cm): 15 N/A N/A Instrument: Curette N/A N/A Bleeding: Moderate N/A N/A Hemostasis Achieved: Pressure N/A N/A Debridement Treatment Procedure was tolerated well N/A N/A Response: Post Debridement 6x2.5x0.4 N/A N/A Measurements L x W x D (cm) Post Debridement Volume: 4.712 N/A N/A (cm) Procedures Performed: Debridement N/A N/A LAKERIA, STARKMAN ([id number]) Treatment Notes Electronic Signature(s) Signed: 01/17/2020 4:48:02 PM By: Linton Ham MD Entered By: Linton Ham on 01/17/2020 10:27:55 [name] ([id number]) -------------------------------------------------------------------------------- [place] Details Patient Name: [name]. Date of Service: 01/17/2020 9:15 AM Medical Record Number: [id number] Patient Account Number: [id number] Date of Birth/Sex: [birth year] (84 y.o. F) Treating RN: Cornell Barman Primary Care Ishana Blades: Otilio Miu Other Clinician: Referring Dhruv Christina: [name] Treating Horton Ellithorpe/Extender: Tito Dine in Treatment: 0 Active Inactive Necrotic Tissue Nursing Diagnoses: Impaired tissue integrity related to necrotic/devitalized tissue Goals: Necrotic/devitalized tissue will be minimized in the wound bed Date Initiated: 01/17/2020 Target Resolution Date: 01/26/2020 Goal Status: Active Interventions: Assess patient pain level pre-, during and post procedure and prior to discharge Treatment Activities: Excisional debridement : 01/17/2020 Notes: Orientation to the Wound Care Program Nursing Diagnoses: Knowledge deficit related to the wound healing center program Goals: Patient/caregiver will verbalize understanding of the [place] Date Initiated: 01/17/2020 Target Resolution Date: 01/26/2020 Goal Status: Active Interventions: Provide education on orientation to the wound center Notes: Wound/Skin Impairment Nursing Diagnoses: Impaired tissue integrity Goals: Patient/caregiver will verbalize understanding of skin care regimen Date Initiated: 01/17/2020 Target Resolution Date: 01/26/2020 Goal Status: Active Ulcer/skin breakdown will have a volume reduction of 30% by week 4 Date Initiated: 01/17/2020 Target Resolution Date: 02/18/2020 Goal Status: Active Interventions: Provide education on smoking Provide education on ulcer and skin care Treatment Activities: Topical wound management initiated : 01/17/2020 Notes: SHAYNNA, HUSBY ([id number]) Electronic Signature(s) Signed: 01/18/2020 6:39:29 PM By: Gretta Cool, BSN, RN, CWS, Kim RN, BSN Entered By: Gretta Cool, BSN, RN, CWS, Kim on 01/17/2020 10:17:15 [name] ([id number]) -------------------------------------------------------------------------------- Pain Assessment Details Patient Name: [name]. Date of Service: 01/17/2020 9:15 AM Medical Record Number: [id number] Patient Account Number: [id number] Date of Birth/Sex: 09-20-1935 (84 y.o. F) Treating  RN: Cornell Barman Primary Care Denny Lave: Otilio Miu Other Clinician: Referring Jamelah Sitzer: [name] Treating Bailey Kolbe/Extender: Tito Dine in Treatment: 0 Active Problems Location of Pain Severity and Description of Pain Patient Has Paino No Site Locations With Dressing Change: No Pain Management and Medication Current Pain Management: Electronic Signature(s) Signed: 01/18/2020 10:49:47 AM By: Darci Needle Signed: 01/18/2020 6:39:29 PM By: Gretta Cool, BSN, RN, CWS, Kim RN, BSN Entered By: Darci Needle on 01/17/2020 09:51:59 [name] ([id number]) -------------------------------------------------------------------------------- Patient/Caregiver Education Details Patient Name: [name]. Date of Service: 01/17/2020 9:15 AM Medical Record Number: [id number] Patient Account Number: [id number] Date of Birth/Gender: 07/25/1935 (84 y.o. F) Treating RN: Cornell Barman Primary Care Physician: Otilio Miu Other Clinician: Referring Physician: [name] Treating Physician/Extender: Tito Dine in Treatment: 0 Education Assessment Education Provided To: Patient Education Topics Provided Smoking and Wound Healing: Handouts: Smoking and Wound Healing Methods: Demonstration, Explain/Verbal Responses: State content correctly Welcome [facility] [place]: Handouts: Welcome [facility] [place] Methods: Demonstration, Explain/Verbal Responses: State content correctly Wound/Skin Impairment: Handouts: Caring for Your Ulcer Methods: Demonstration, Explain/Verbal Responses: State content correctly Electronic Signature(s) Signed: 01/18/2020 6:39:29 PM By: Gretta Cool, BSN, RN, CWS, Kim RN, BSN  Entered By: Gretta Cool, BSN, RN, CWS, Kim on 01/17/2020 10:29:12 [name] ([id number]) -------------------------------------------------------------------------------- Wound Assessment Details Patient Name: [name]. Date of Service: 01/17/2020 9:15  AM Medical Record Number: [id number] Patient Account Number: [id number] Date of Birth/Sex: March 25, 1936 (84 y.o. F) Treating RN: Cornell Barman Primary Care Irys Nigh: Otilio Miu Other Clinician: Referring Tishawn Friedhoff: [name] Treating Hajer Dwyer/Extender: Tito Dine in Treatment: 0 Wound Status Wound Number: 2 Primary Etiology: Trauma, Other Wound Location: Lower Leg Wound Status: Open Wounding Event: Trauma Date Acquired: 12/25/2019 Weeks Of Treatment: 0 Clustered Wound: No Photos Wound Measurements Length: (cm) 6 Width: (cm) 2.5 Depth: (cm) 0.1 Area: (cm) 11.781 Volume: (cm) 1.178 % Reduction in Area: % Reduction in Volume: Epithelialization: Small (1-33%) Tunneling: No Undermining: No Wound Description Classification: Full Thickness Without Exposed Support Structu Exudate Amount: Medium Exudate Type: Serosanguineous Exudate Color: red, brown res Foul Odor After Cleansing: No Slough/Fibrino Yes Wound Bed Granulation Amount: Small (1-33%) Exposed Structure Granulation Quality: Red Fascia Exposed: No Necrotic Amount: Large (67-100%) Fat Layer (Subcutaneous Tissue) Exposed: Yes Necrotic Quality: Eschar, Adherent Slough Tendon Exposed: No Muscle Exposed: No Joint Exposed: No Bone Exposed: No Treatment Notes Wound #2 (Lower Leg) Notes Prisma, ABD. 3LL unna to anchor Electronic Signature(s) Signed: 01/18/2020 10:49:47 AM By: Darci Needle Signed: 01/18/2020 6:39:29 PM By: Gretta Cool BSN, RN, CWS, Kim RN, BSN [place], [place] DMarland Kitchen ([id number]) Entered By: Darci Needle on 01/17/2020 09:57:56 [name] ([id number]) -------------------------------------------------------------------------------- Vitals Details Patient Name: [name]. Date of Service: 01/17/2020 9:15 AM Medical Record Number: [id number] Patient Account Number: [id number] Date of Birth/Sex: Jun 04, 1935 (84 y.o. F) Treating RN: Cornell Barman Primary Care Rayme Bui: Otilio Miu Other  Clinician: Referring Elorah Dewing: [name] Treating Rudene Poulsen/Extender: Tito Dine in Treatment: 0 Vital Signs Time Taken: 09:40 Temperature (F): 98.2 Height (in): 65 Pulse (bpm): 89 Source: Stated Respiratory Rate (breaths/min): 22 Weight (lbs): 140 Blood Pressure (mmHg): 132/94 Source: Stated Reference Range: 80 - 120 mg / dl Body Mass Index (BMI): 23.3 Electronic Signature(s) Signed: 01/18/2020 10:49:47 AM By: Darci Needle Entered By: Darci Needle on 01/17/2020 09:52:54

## 2020-01-24 ENCOUNTER — Ambulatory Visit: Payer: Medicare Other | Admitting: Internal Medicine

## 2020-01-24 ENCOUNTER — Other Ambulatory Visit: Payer: Self-pay

## 2020-01-24 ENCOUNTER — Encounter: Payer: Medicare Other | Admitting: Internal Medicine

## 2020-01-24 DIAGNOSIS — J449 Chronic obstructive pulmonary disease, unspecified: Secondary | ICD-10-CM | POA: Diagnosis not present

## 2020-01-24 DIAGNOSIS — K7460 Unspecified cirrhosis of liver: Secondary | ICD-10-CM | POA: Diagnosis not present

## 2020-01-24 DIAGNOSIS — G4700 Insomnia, unspecified: Secondary | ICD-10-CM | POA: Diagnosis not present

## 2020-01-24 DIAGNOSIS — I87323 Chronic venous hypertension (idiopathic) with inflammation of bilateral lower extremity: Secondary | ICD-10-CM | POA: Diagnosis not present

## 2020-01-24 DIAGNOSIS — L97822 Non-pressure chronic ulcer of other part of left lower leg with fat layer exposed: Secondary | ICD-10-CM | POA: Diagnosis not present

## 2020-01-24 DIAGNOSIS — I872 Venous insufficiency (chronic) (peripheral): Secondary | ICD-10-CM | POA: Diagnosis not present

## 2020-01-24 DIAGNOSIS — I87312 Chronic venous hypertension (idiopathic) with ulcer of left lower extremity: Secondary | ICD-10-CM | POA: Diagnosis not present

## 2020-01-24 DIAGNOSIS — L97821 Non-pressure chronic ulcer of other part of left lower leg limited to breakdown of skin: Secondary | ICD-10-CM | POA: Diagnosis not present

## 2020-01-24 DIAGNOSIS — Z853 Personal history of malignant neoplasm of breast: Secondary | ICD-10-CM | POA: Diagnosis not present

## 2020-01-24 DIAGNOSIS — M21372 Foot drop, left foot: Secondary | ICD-10-CM | POA: Diagnosis not present

## 2020-01-24 DIAGNOSIS — S81812D Laceration without foreign body, left lower leg, subsequent encounter: Secondary | ICD-10-CM | POA: Diagnosis not present

## 2020-01-24 DIAGNOSIS — L97921 Non-pressure chronic ulcer of unspecified part of left lower leg limited to breakdown of skin: Secondary | ICD-10-CM | POA: Diagnosis not present
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
## 2020-01-25 NOTE — Progress Notes (Signed)
[name], [name] ([id number]) Visit Report for 01/24/2020 Arrival Information Details Patient Name: [name]. Date of Service: 01/24/2020 9:15 AM Medical Record Number: [id number] Patient Account Number: [id number] Date of Birth/Sex: Mar 17, 1936 (84 y.o. F) Treating RN: Cornell Barman Primary Care Jasmyne Lodato: Otilio Miu Other Clinician: Referring Kymani Laursen: Otilio Miu Treating Elby Blackwelder/Extender: Tito Dine in Treatment: 1 Visit Information History Since Last Visit Added or deleted any medications: No Patient Arrived: Ambulatory Has Dressing in Place as Prescribed: Yes Arrival Time: 09:11 Has Compression in Place as Prescribed: Yes Accompanied By: self Pain Present Now: No Transfer Assistance: None Patient Identification Verified: Yes Secondary Verification Process Completed: Yes Patient Requires Transmission-Based Precautions: No Patient Has Alerts: No Electronic Signature(s) Signed: 01/24/2020 5:45:00 PM By: Gretta Cool, BSN, RN, CWS, Kim RN, BSN Entered By: Gretta Cool, BSN, RN, CWS, Kim on 01/24/2020 09:21:10 [name] ([id number]) -------------------------------------------------------------------------------- Clinic Level of Care Assessment Details Patient Name: [name]. Date of Service: 01/24/2020 9:15 AM Medical Record Number: [id number] Patient Account Number: [id number] Date of Birth/Sex: [birth year] (84 y.o. F) Treating RN: Cornell Barman Primary Care Carri Spillers: Otilio Miu Other Clinician: Referring Takiah Maiden: Otilio Miu Treating Tamula Morrical/Extender: Tito Dine in Treatment: 1 Clinic Level of Care Assessment Items TOOL 1 Quantity Score []  - Use when EandM and Procedure is performed on INITIAL visit 0 ASSESSMENTS - Nursing Assessment / Reassessment []  - General Physical Exam (combine w/ comprehensive assessment (listed just below) when performed on new 0 pt. evals) []  - 0 Comprehensive Assessment (HX, ROS, Risk Assessments, Wounds Hx,  etc.) ASSESSMENTS - Wound and Skin Assessment / Reassessment []  - Dermatologic / Skin Assessment (not related to wound area) 0 ASSESSMENTS - Ostomy and/or Continence Assessment and Care []  - Incontinence Assessment and Management 0 []  - 0 Ostomy Care Assessment and Management (repouching, etc.) PROCESS - Coordination of Care []  - Simple Patient / Family Education for ongoing care 0 []  - 0 Complex (extensive) Patient / Family Education for ongoing care []  - 0 Staff obtains Programmer, systems, Records, Test Results / Process Orders []  - 0 Staff telephones HHA, Nursing Homes / Clarify orders / etc []  - 0 Routine Transfer to another Facility (non-emergent condition) []  - 0 Routine Hospital Admission (non-emergent condition) []  - 0 New Admissions / Biomedical engineer / Ordering NPWT, Apligraf, etc. []  - 0 Emergency Hospital Admission (emergent condition) PROCESS - Special Needs []  - Pediatric / Minor Patient Management 0 []  - 0 Isolation Patient Management []  - 0 Hearing / Language / Visual special needs []  - 0 Assessment of Community assistance (transportation, D/C planning, etc.) []  - 0 Additional assistance / Altered mentation []  - 0 Support Surface(s) Assessment (bed, cushion, seat, etc.) INTERVENTIONS - Miscellaneous []  - External ear exam 0 []  - 0 Patient Transfer (multiple staff / Civil Service fast streamer / Similar devices) []  - 0 Simple Staple / Suture removal (25 or less) []  - 0 Complex Staple / Suture removal (26 or more) []  - 0 Hypo/Hyperglycemic Management (do not check if billed separately) []  - 0 Ankle / Brachial Index (ABI) - do not check if billed separately Has the patient been seen at the hospital within the last three years: Yes Total Score: 0 Level Of Care: ____ [name] ([id number]) Electronic Signature(s) Signed: 01/24/2020 5:45:00 PM By: Gretta Cool, BSN, RN, CWS, Kim RN, BSN Entered By: Gretta Cool, BSN, RN, CWS, Kim on 01/24/2020 09:50:39 [name]  ([id number]) -------------------------------------------------------------------------------- Compression Therapy Details Patient Name: [name]. Date of Service: 01/24/2020 9:15 AM  Medical Record Number: [id number] Patient Account Number: [id number] Date of Birth/Sex: 1936-04-08 (84 y.o. F) Treating RN: Cornell Barman Primary Care Francisca Harbuck: Otilio Miu Other Clinician: Referring Sohum Delillo: Otilio Miu Treating Dante Cooter/Extender: Tito Dine in Treatment: 1 Compression Therapy Performed for Wound Assessment: Wound #2 Lower Leg Performed By: Clinician Cornell Barman, RN Compression Type: Three Layer Pre Treatment ABI: 1 Post Procedure Diagnosis Same as Pre-procedure Electronic Signature(s) Signed: 01/24/2020 5:45:00 PM By: Gretta Cool, BSN, RN, CWS, Kim RN, BSN Entered By: Gretta Cool, BSN, RN, CWS, Kim on 01/24/2020 09:35:41 [name] ([id number]) -------------------------------------------------------------------------------- Encounter Discharge Information Details Patient Name: [name]. Date of Service: 01/24/2020 9:15 AM Medical Record Number: [id number] Patient Account Number: [id number] Date of Birth/Sex: 1936/01/13 (84 y.o. F) Treating RN: Cornell Barman Primary Care Atilano Covelli: Otilio Miu Other Clinician: Referring Jinelle Butchko: Otilio Miu Treating Quinita Kostelecky/Extender: Tito Dine in Treatment: 1 Encounter Discharge Information Items Discharge Condition: Stable Ambulatory Status: Ambulatory Discharge Destination: Home Transportation: Private Auto Accompanied By: self Schedule Follow-up Appointment: Yes Clinical Summary of Care: Electronic Signature(s) Signed: 01/24/2020 5:24:50 PM By: Gretta Cool, BSN, RN, CWS, Kim RN, BSN Entered By: Gretta Cool, BSN, RN, CWS, Kim on 01/24/2020 17:24:50 [name] ([id number]) -------------------------------------------------------------------------------- Lower Extremity Assessment Details Patient Name: [name]. Date of Service: 01/24/2020 9:15 AM Medical Record Number: [id number] Patient Account Number: [id number] Date of Birth/Sex: 09/24/1935 (84 y.o. F) Treating RN: Cornell Barman Primary Care Christee Mervine: Otilio Miu Other Clinician: Referring Jakya Dovidio: Otilio Miu Treating Jin Shockley/Extender: Ricard Dillon Weeks in Treatment: 1 Edema Assessment Assessed: [Left: No] [Right: No] Edema: [Left: Ye] [Right: s] Calf Left: Right: Point of Measurement: 25 cm From Medial Instep 29 cm cm Ankle Left: Right: Point of Measurement: 12 cm From Medial Instep 22 cm cm Vascular Assessment Pulses: Dorsalis Pedis Palpable: [Left:Yes] Electronic Signature(s) Signed: 01/24/2020 5:45:00 PM By: Gretta Cool, BSN, RN, CWS, Kim RN, BSN Entered By: Gretta Cool, BSN, RN, CWS, Kim on 01/24/2020 09:26:45 [name] ([id number]) -------------------------------------------------------------------------------- Multi Wound Chart Details Patient Name: [name]. Date of Service: 01/24/2020 9:15 AM Medical Record Number: [id number] Patient Account Number: [id number] Date of Birth/Sex: [birth year] (84 y.o. F) Treating RN: Cornell Barman Primary Care Jaline Pincock: Otilio Miu Other Clinician: Referring Gloriana Piltz: Otilio Miu Treating Syvilla Martin/Extender: Tito Dine in Treatment: 1 Vital Signs Height(in): 65 Pulse(bpm): 88 Weight(lbs): 140 Blood Pressure(mmHg): 136/81 Body Mass Index(BMI): 23 Temperature(F): 98.9 Respiratory Rate(breaths/min): 18 Photos: [N/A:N/A] Wound Location: Lower Leg N/A N/A Wounding Event: Trauma N/A N/A Primary Etiology: Trauma, Other N/A N/A Date Acquired: 12/25/2019 N/A N/A Weeks of Treatment: 1 N/A N/A Wound Status: Open N/A N/A Measurements L x W x D (cm) 3.5x2x0.4 N/A N/A Area (cm) : 5.498 N/A N/A Volume (cm) : 2.199 N/A N/A % Reduction in Area: 53.30% N/A N/A % Reduction in Volume: -86.70% N/A N/A Classification: Full Thickness Without Exposed N/A N/A Support  Structures Exudate Amount: Medium N/A N/A Exudate Type: Serosanguineous N/A N/A Exudate Color: red, brown N/A N/A Wound Margin: Flat and Intact N/A N/A Granulation Amount: Medium (34-66%) N/A N/A Granulation Quality: Red N/A N/A Necrotic Amount: Medium (34-66%) N/A N/A Necrotic Tissue: Eschar, Adherent Slough N/A N/A Exposed Structures: Fat Layer (Subcutaneous Tissue): N/A N/A Yes Fascia: No Tendon: No Muscle: No Joint: No Bone: No Epithelialization: Small (1-33%) N/A N/A Procedures Performed: Compression Therapy N/A N/A Treatment Notes Electronic Signature(s) Signed: 01/25/2020 4:51:35 PM By: Linton Ham MD Entered By: Linton Ham on 01/24/2020 09:37:03 [name] ([id number]) -------------------------------------------------------------------------------- Multi-Disciplinary Care Plan Details Patient  Name: Leanne Lovely D. Date of Service: 01/24/2020 9:15 AM Medical Record Number: [id number] Patient Account Number: [id number] Date of Birth/Sex: 09-06-1935 (84 y.o. F) Treating RN: Cornell Barman Primary Care Jeananne Bedwell: Otilio Miu Other Clinician: Referring Daly Whipkey: Otilio Miu Treating Cailean Heacock/Extender: Tito Dine in Treatment: 1 Active Inactive Necrotic Tissue Nursing Diagnoses: Impaired tissue integrity related to necrotic/devitalized tissue Goals: Necrotic/devitalized tissue will be minimized in the wound bed Date Initiated: 01/17/2020 Target Resolution Date: 01/26/2020 Goal Status: Active Interventions: Assess patient pain level pre-, during and post procedure and prior to discharge Treatment Activities: Excisional debridement : 01/17/2020 Notes: Orientation to the Wound Care Program Nursing Diagnoses: Knowledge deficit related to the wound healing center program Goals: Patient/caregiver will verbalize understanding of the [facility] Date Initiated: 01/17/2020 Target Resolution Date: 01/26/2020 Goal Status:  Active Interventions: Provide education on orientation to the wound center Notes: Wound/Skin Impairment Nursing Diagnoses: Impaired tissue integrity Goals: Patient/caregiver will verbalize understanding of skin care regimen Date Initiated: 01/17/2020 Target Resolution Date: 01/26/2020 Goal Status: Active Ulcer/skin breakdown will have a volume reduction of 30% by week 4 Date Initiated: 01/17/2020 Target Resolution Date: 02/18/2020 Goal Status: Active Interventions: Provide education on smoking Provide education on ulcer and skin care Treatment Activities: Topical wound management initiated : 01/17/2020 Notes: ELLENIE, SALOME ([id number]) Electronic Signature(s) Signed: 01/24/2020 5:45:00 PM By: Gretta Cool, BSN, RN, CWS, Kim RN, BSN Entered By: Gretta Cool, BSN, RN, CWS, Kim on 01/24/2020 09:30:58 [name] ([id number]) -------------------------------------------------------------------------------- Pain Assessment Details Patient Name: [name]. Date of Service: 01/24/2020 9:15 AM Medical Record Number: [id number] Patient Account Number: [id number] Date of Birth/Sex: 1936-01-18 (84 y.o. F) Treating RN: Cornell Barman Primary Care Royce Stegman: Otilio Miu Other Clinician: Referring Lofton Leon: Otilio Miu Treating Nadiyah Zeis/Extender: Tito Dine in Treatment: 1 Active Problems Location of Pain Severity and Description of Pain Patient Has Paino No Site Locations Pain Management and Medication Current Pain Management: Electronic Signature(s) Signed: 01/24/2020 5:45:00 PM By: Gretta Cool, BSN, RN, CWS, Kim RN, BSN Entered By: Gretta Cool, BSN, RN, CWS, Kim on 01/24/2020 09:22:56 [name] ([id number]) -------------------------------------------------------------------------------- Patient/Caregiver Education Details Patient Name: [name]. Date of Service: 01/24/2020 9:15 AM Medical Record Number: [id number] Patient Account Number: [id number] Date of Birth/Gender:  1935/07/17 (84 y.o. F) Treating RN: Cornell Barman Primary Care Physician: Otilio Miu Other Clinician: Referring Physician: Otilio Miu Treating Physician/Extender: Tito Dine in Treatment: 1 Education Assessment Education Provided To: Patient Education Topics Provided Venous: Handouts: Controlling Swelling with Compression Stockings Methods: Demonstration, Explain/Verbal Responses: State content correctly Electronic Signature(s) Signed: 01/24/2020 5:45:00 PM By: Gretta Cool, BSN, RN, CWS, Kim RN, BSN Entered By: Gretta Cool, BSN, RN, CWS, Kim on 01/24/2020 17:24:14 [name] ([id number]) -------------------------------------------------------------------------------- Wound Assessment Details Patient Name: [name]. Date of Service: 01/24/2020 9:15 AM Medical Record Number: [id number] Patient Account Number: [id number] Date of Birth/Sex: 14-Feb-1936 (84 y.o. F) Treating RN: Cornell Barman Primary Care Emet Rafanan: Otilio Miu Other Clinician: Referring Kya Mayfield: Otilio Miu Treating Javen Hinderliter/Extender: Tito Dine in Treatment: 1 Wound Status Wound Number: 2 Primary Etiology: Trauma, Other Wound Location: Lower Leg Wound Status: Open Wounding Event: Trauma Date Acquired: 12/25/2019 Weeks Of Treatment: 1 Clustered Wound: No Photos Wound Measurements Length: (cm) 3.5 Width: (cm) 2 Depth: (cm) 0.4 Area: (cm) 5.498 Volume: (cm) 2.199 % Reduction in Area: 53.3% % Reduction in Volume: -86.7% Epithelialization: Small (1-33%) Wound Description Classification: Full Thickness Without Exposed Support Structu Wound Margin: Flat and Intact Exudate Amount: Medium Exudate Type: Serosanguineous Exudate Color:  red, brown res Foul Odor After Cleansing: No Slough/Fibrino Yes Wound Bed Granulation Amount: Medium (34-66%) Exposed Structure Granulation Quality: Red Fascia Exposed: No Necrotic Amount: Medium (34-66%) Fat Layer (Subcutaneous Tissue) Exposed:  Yes Necrotic Quality: Eschar, Adherent Slough Tendon Exposed: No Muscle Exposed: No Joint Exposed: No Bone Exposed: No Electronic Signature(s) Signed: 01/24/2020 5:45:00 PM By: Gretta Cool, BSN, RN, CWS, Kim RN, BSN Entered By: Gretta Cool, BSN, RN, CWS, Kim on 01/24/2020 09:23:54 [name] ([id number]) -------------------------------------------------------------------------------- Vitals Details Patient Name: [name]. Date of Service: 01/24/2020 9:15 AM Medical Record Number: [id number] Patient Account Number: [id number] Date of Birth/Sex: February 28, 1936 (84 y.o. F) Treating RN: Cornell Barman Primary Care Vidal Lampkins: Otilio Miu Other Clinician: Referring Dejae Bernet: Otilio Miu Treating Lillybeth Tal/Extender: Tito Dine in Treatment: 1 Vital Signs Time Taken: 09:21 Temperature (F): 98.9 Height (in): 65 Pulse (bpm): 88 Weight (lbs): 140 Respiratory Rate (breaths/min): 18 Body Mass Index (BMI): 23.3 Blood Pressure (mmHg): 136/81 Reference Range: 80 - 120 mg / dl Electronic Signature(s) Signed: 01/24/2020 5:45:00 PM By: Gretta Cool, BSN, RN, CWS, Kim RN, BSN Entered By: Gretta Cool, BSN, RN, CWS, Kim on 01/24/2020 76:54:65

## 2020-01-29 ENCOUNTER — Ambulatory Visit: Payer: Medicare Other | Admitting: Family Medicine

## 2020-01-31 ENCOUNTER — Encounter: Payer: Medicare Other | Attending: Internal Medicine | Admitting: Internal Medicine

## 2020-01-31 ENCOUNTER — Other Ambulatory Visit: Payer: Self-pay

## 2020-01-31 DIAGNOSIS — K7460 Unspecified cirrhosis of liver: Secondary | ICD-10-CM | POA: Diagnosis not present

## 2020-01-31 DIAGNOSIS — L97822 Non-pressure chronic ulcer of other part of left lower leg with fat layer exposed: Secondary | ICD-10-CM | POA: Insufficient documentation

## 2020-01-31 DIAGNOSIS — J449 Chronic obstructive pulmonary disease, unspecified: Secondary | ICD-10-CM | POA: Insufficient documentation

## 2020-01-31 DIAGNOSIS — I872 Venous insufficiency (chronic) (peripheral): Secondary | ICD-10-CM | POA: Insufficient documentation

## 2020-01-31 DIAGNOSIS — Z87442 Personal history of urinary calculi: Secondary | ICD-10-CM | POA: Insufficient documentation

## 2020-01-31 DIAGNOSIS — X58XXXA Exposure to other specified factors, initial encounter: Secondary | ICD-10-CM | POA: Diagnosis not present

## 2020-01-31 DIAGNOSIS — S81812D Laceration without foreign body, left lower leg, subsequent encounter: Secondary | ICD-10-CM | POA: Diagnosis not present

## 2020-01-31 DIAGNOSIS — Z853 Personal history of malignant neoplasm of breast: Secondary | ICD-10-CM | POA: Insufficient documentation
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
## 2020-02-01 NOTE — Progress Notes (Signed)
[place], DOD[place] ([id number]) Visit Report for 01/31/2020 Arrival Information Details Patient Name: [name], [name]. Date of Service: 01/31/2020 10:45 AM Medical Record Number: [id number] Patient Account Number: [id number] Date of Birth/Sex: 06-19-1935 (84 y.o. F) Treating RN: Cornell Barman Primary Care Treina Arscott: Otilio Miu Other Clinician: Referring Christene Pounds: Otilio Miu Treating Akaisha Truman/Extender: Tito Dine in Treatment: 2 Visit Information History Since Last Visit Added or deleted any medications: No Patient Arrived: Ambulatory Any new allergies or adverse reactions: No Arrival Time: 11:07 Had a fall or experienced change in No Accompanied By: self activities of daily living that may affect Transfer Assistance: None risk of falls: Patient Identification Verified: Yes Signs or symptoms of abuse/neglect since last visito No Secondary Verification Process Completed: Yes Hospitalized since last visit: No Patient Requires Transmission-Based Precautions: No Implantable device outside of the clinic excluding No Patient Has Alerts: No cellular tissue based products placed in the center since last visit: Has Dressing in Place as Prescribed: Yes Has Compression in Place as Prescribed: Yes Pain Present Now: No Electronic Signature(s) Signed: 01/31/2020 3:55:47 PM By: Lorine Bears RCP, RRT, CHT Entered By: Lorine Bears on 01/31/2020 11:07:53 [name] ([id number]) -------------------------------------------------------------------------------- Compression Therapy Details Patient Name: Summer Lovely D. Date of Service: 01/31/2020 10:45 AM Medical Record Number: [id number] Patient Account Number: [id number] Date of Birth/Sex: [birth year] (84 y.o. F) Treating RN: Cornell Barman Primary Care Kaidin Boehle: Otilio Miu Other Clinician: Referring Pricsilla Lindvall: Otilio Miu Treating Seleste Tallman/Extender: Tito Dine in Treatment: 2 Compression  Therapy Performed for Wound Assessment: Wound #2 Left,Medial Lower Leg Performed By: Clinician Cornell Barman, RN Compression Type: Three Layer Pre Treatment ABI: 1 Post Procedure Diagnosis Same as Pre-procedure Electronic Signature(s) Signed: 01/31/2020 11:41:11 AM By: Gretta Cool, BSN, RN, CWS, Kim RN, BSN Entered By: Gretta Cool, BSN, RN, CWS, Kim on 01/31/2020 11:35:19 [name] ([id number]) -------------------------------------------------------------------------------- Encounter Discharge Information Details Patient Name: [name]. Date of Service: 01/31/2020 10:45 AM Medical Record Number: [id number] Patient Account Number: [id number] Date of Birth/Sex: 01-19-1936 (84 y.o. F) Treating RN: Cornell Barman Primary Care Roderick Sweezy: Otilio Miu Other Clinician: Referring Deejay Koppelman: Otilio Miu Treating Cheyne Boulden/Extender: Tito Dine in Treatment: 2 Encounter Discharge Information Items Discharge Condition: Stable Ambulatory Status: Ambulatory Discharge Destination: Home Transportation: Private Auto Accompanied By: self Schedule Follow-up Appointment: Yes Clinical Summary of Care: Electronic Signature(s) Signed: 01/31/2020 11:41:11 AM By: Gretta Cool, BSN, RN, CWS, Kim RN, BSN Entered By: Gretta Cool, BSN, RN, CWS, Kim on 01/31/2020 11:37:57 [name] ([id number]) -------------------------------------------------------------------------------- Lower Extremity Assessment Details Patient Name: [name]. Date of Service: 01/31/2020 10:45 AM Medical Record Number: [id number] Patient Account Number: [id number] Date of Birth/Sex: 02-15-1936 (84 y.o. F) Treating RN: Cornell Barman Primary Care Delancey Moraes: Otilio Miu Other Clinician: Referring Elliot Simoneaux: Otilio Miu Treating Facundo Allemand/Extender: Ricard Dillon Weeks in Treatment: 2 Edema Assessment Assessed: [Left: Yes] [Right: No] Edema: [Left: Ye] [Right: s] Calf Left: Right: Point of Measurement: 25 cm From Medial Instep 28.5  cm cm Ankle Left: Right: Point of Measurement: 12 cm From Medial Instep 22 cm cm Vascular Assessment Pulses: Dorsalis Pedis Palpable: [Left:Yes] Doppler Audible: [Left:Yes] Posterior Tibial Palpable: [Left:Yes] Electronic Signature(s) Signed: 01/31/2020 11:38:20 AM By: Darci Needle Signed: 01/31/2020 11:41:11 AM By: Gretta Cool, BSN, RN, CWS, Kim RN, BSN Entered By: Darci Needle on 01/31/2020 11:30:39 [name] ([id number]) -------------------------------------------------------------------------------- Multi Wound Chart Details Patient Name: [name]. Date of Service: 01/31/2020 10:45 AM Medical Record Number: [id number] Patient Account Number: [id number] Date of Birth/Sex: 1935/07/18 (84 y.o.  F) Treating RN: Cornell Barman Primary Care Jermine Bibbee: Otilio Miu Other Clinician: Referring Sharilyn Geisinger: Otilio Miu Treating Donovon Micheletti/Extender: Tito Dine in Treatment: 2 Vital Signs Height(in): 65 Pulse(bpm): 30 Weight(lbs): 140 Blood Pressure(mmHg): 143/74 Body Mass Index(BMI): 23 Temperature(F): 98.0 Respiratory Rate(breaths/min): 18 Photos: [N/A:N/A] Wound Location: Left, Medial Lower Leg N/A N/A Wounding Event: Trauma N/A N/A Primary Etiology: Trauma, Other N/A N/A Date Acquired: 12/25/2019 N/A N/A Weeks of Treatment: 2 N/A N/A Wound Status: Open N/A N/A Measurements L x W x D (cm) 4.5x2x0.2 N/A N/A Area (cm) : 7.069 N/A N/A Volume (cm) : 1.414 N/A N/A % Reduction in Area: 40.00% N/A N/A % Reduction in Volume: -20.00% N/A N/A Classification: Full Thickness Without Exposed N/A N/A Support Structures Exudate Amount: Medium N/A N/A Exudate Type: Serosanguineous N/A N/A Exudate Color: red, brown N/A N/A Wound Margin: Flat and Intact N/A N/A Granulation Amount: Medium (34-66%) N/A N/A Granulation Quality: Red N/A N/A Necrotic Amount: Medium (34-66%) N/A N/A Necrotic Tissue: Eschar, Adherent Slough N/A N/A Exposed Structures: Fat Layer  (Subcutaneous Tissue): N/A N/A Yes Fascia: No Tendon: No Muscle: No Joint: No Bone: No Epithelialization: Small (1-33%) N/A N/A Procedures Performed: Compression Therapy N/A N/A Treatment Notes Wound #2 (Left, Medial Lower Leg) Notes Hydrofera Blue, ABD. 3LL unna to anchor [name], [name] ([id number]) Electronic Signature(s) Signed: 02/01/2020 9:14:54 AM By: Linton Ham MD Previous Signature: 01/31/2020 11:41:11 AM Version By: Gretta Cool, BSN, RN, CWS, Kim RN, BSN Entered By: Linton Ham on 01/31/2020 11:52:45 [name] ([id number]) -------------------------------------------------------------------------------- [place] Details Patient Name: [name]. Date of Service: 01/31/2020 10:45 AM Medical Record Number: [id number] Patient Account Number: [id number] Date of Birth/Sex: [birth year] (84 y.o. F) Treating RN: Cornell Barman Primary Care Kirby Argueta: Otilio Miu Other Clinician: Referring Blythe Veach: Otilio Miu Treating Mishawn Hemann/Extender: Tito Dine in Treatment: 2 Active Inactive Necrotic Tissue Nursing Diagnoses: Impaired tissue integrity related to necrotic/devitalized tissue Goals: Necrotic/devitalized tissue will be minimized in the wound bed Date Initiated: 01/17/2020 Target Resolution Date: 01/26/2020 Goal Status: Active Interventions: Assess patient pain level pre-, during and post procedure and prior to discharge Treatment Activities: Excisional debridement : 01/17/2020 Notes: Wound/Skin Impairment Nursing Diagnoses: Impaired tissue integrity Goals: Patient/caregiver will verbalize understanding of skin care regimen Date Initiated: 01/17/2020 Target Resolution Date: 01/26/2020 Goal Status: Active Ulcer/skin breakdown will have a volume reduction of 30% by week 4 Date Initiated: 01/17/2020 Target Resolution Date: 02/18/2020 Goal Status: Active Interventions: Provide education on smoking Provide education on ulcer and skin  care Treatment Activities: Topical wound management initiated : 01/17/2020 Notes: Electronic Signature(s) Signed: 01/31/2020 11:41:11 AM By: Gretta Cool, BSN, RN, CWS, Kim RN, BSN Entered By: Gretta Cool, BSN, RN, CWS, Kim on 01/31/2020 11:34:41 [name] ([id number]) -------------------------------------------------------------------------------- Pain Assessment Details Patient Name: [name]. Date of Service: 01/31/2020 10:45 AM Medical Record Number: [id number] Patient Account Number: [id number] Date of Birth/Sex: 09-19-1935 (84 y.o. F) Treating RN: Cornell Barman Primary Care Andi Mahaffy: Otilio Miu Other Clinician: Referring Mordche Hedglin: Otilio Miu Treating Jumana Paccione/Extender: Tito Dine in Treatment: 2 Active Problems Location of Pain Severity and Description of Pain Patient Has Paino No Site Locations With Dressing Change: No Pain Management and Medication Current Pain Management: Electronic Signature(s) Signed: 01/31/2020 11:38:20 AM By: Darci Needle Signed: 01/31/2020 11:41:11 AM By: Gretta Cool, BSN, RN, CWS, Kim RN, BSN Entered By: Darci Needle on 01/31/2020 11:26:56 [name] ([id number]) -------------------------------------------------------------------------------- Patient/Caregiver Education Details Patient Name: [name]. Date of Service: 01/31/2020 10:45 AM Medical Record Number: [id number] Patient  Account Number: [id number] Date of Birth/Gender: Jul 09, 1935 (84 y.o. F) Treating RN: Cornell Barman Primary Care Physician: Otilio Miu Other Clinician: Referring Physician: Otilio Miu Treating Physician/Extender: Tito Dine in Treatment: 2 Education Assessment Education Provided To: Patient Education Topics Provided Wound/Skin Impairment: Handouts: Caring for Your Ulcer Methods: Demonstration, Explain/Verbal Responses: State content correctly Electronic Signature(s) Signed: 01/31/2020 11:41:11 AM By: Gretta Cool, BSN, RN, CWS, Kim RN,  BSN Entered By: Gretta Cool, BSN, RN, CWS, Kim on 01/31/2020 11:37:02 [name] ([id number]) -------------------------------------------------------------------------------- Wound Assessment Details Patient Name: [name]. Date of Service: 01/31/2020 10:45 AM Medical Record Number: [id number] Patient Account Number: [id number] Date of Birth/Sex: [birth year] (84 y.o. F) Treating RN: Cornell Barman Primary Care Kang Ishida: Otilio Miu Other Clinician: Referring Jaylen Knope: Otilio Miu Treating Jerrelle Michelsen/Extender: Tito Dine in Treatment: 2 Wound Status Wound Number: 2 Primary Etiology: Trauma, Other Wound Location: Left, Medial Lower Leg Wound Status: Open Wounding Event: Trauma Date Acquired: 12/25/2019 Weeks Of Treatment: 2 Clustered Wound: No Photos Wound Measurements Length: (cm) 4.5 Width: (cm) 2 Depth: (cm) 0.2 Area: (cm) 7.069 Volume: (cm) 1.414 % Reduction in Area: 40% % Reduction in Volume: -20% Epithelialization: Small (1-33%) Wound Description Classification: Full Thickness Without Exposed Support Structu Wound Margin: Flat and Intact Exudate Amount: Medium Exudate Type: Serosanguineous Exudate Color: red, brown res Foul Odor After Cleansing: No Slough/Fibrino Yes Wound Bed Granulation Amount: Medium (34-66%) Exposed Structure Granulation Quality: Red Fascia Exposed: No Necrotic Amount: Medium (34-66%) Fat Layer (Subcutaneous Tissue) Exposed: Yes Necrotic Quality: Eschar, Adherent Slough Tendon Exposed: No Muscle Exposed: No Joint Exposed: No Bone Exposed: No Treatment Notes Wound #2 (Left, Medial Lower Leg) Notes Hydrofera Blue, ABD. 3LL unna to anchor Electronic Signature(s) Signed: 01/31/2020 11:38:20 AM By: Elouise Munroe ([id number]) Signed: 01/31/2020 11:41:11 AM By: Gretta Cool, BSN, RN, CWS, Kim RN, BSN Entered By: Darci Needle on 01/31/2020 11:29:22 [name]  ([id number]) -------------------------------------------------------------------------------- Vitals Details Patient Name: [name]. Date of Service: 01/31/2020 10:45 AM Medical Record Number: [id number] Patient Account Number: [id number] Date of Birth/Sex: Sep 20, 1935 (84 y.o. F) Treating RN: Cornell Barman Primary Care Azusena Erlandson: Otilio Miu Other Clinician: Referring Zandyr Barnhill: Otilio Miu Treating Navya Timmons/Extender: Tito Dine in Treatment: 2 Vital Signs Time Taken: 11:07 Temperature (F): 98.0 Height (in): 65 Pulse (bpm): 83 Weight (lbs): 140 Respiratory Rate (breaths/min): 18 Body Mass Index (BMI): 23.3 Blood Pressure (mmHg): 143/74 Reference Range: 80 - 120 mg / dl Electronic Signature(s) Signed: 01/31/2020 3:55:47 PM By: Lorine Bears RCP, RRT, CHT Entered By: Lorine Bears on 01/31/2020 11:11:07

## 2020-02-07 ENCOUNTER — Encounter: Payer: Medicare Other | Admitting: Internal Medicine

## 2020-02-07 ENCOUNTER — Other Ambulatory Visit: Payer: Self-pay

## 2020-02-07 DIAGNOSIS — Z853 Personal history of malignant neoplasm of breast: Secondary | ICD-10-CM | POA: Diagnosis not present

## 2020-02-07 DIAGNOSIS — J449 Chronic obstructive pulmonary disease, unspecified: Secondary | ICD-10-CM | POA: Diagnosis not present

## 2020-02-07 DIAGNOSIS — I872 Venous insufficiency (chronic) (peripheral): Secondary | ICD-10-CM | POA: Diagnosis not present

## 2020-02-07 DIAGNOSIS — S81802A Unspecified open wound, left lower leg, initial encounter: Secondary | ICD-10-CM | POA: Diagnosis not present

## 2020-02-07 DIAGNOSIS — L97822 Non-pressure chronic ulcer of other part of left lower leg with fat layer exposed: Secondary | ICD-10-CM | POA: Diagnosis not present

## 2020-02-07 DIAGNOSIS — Z87442 Personal history of urinary calculi: Secondary | ICD-10-CM | POA: Diagnosis not present

## 2020-02-07 DIAGNOSIS — S81812D Laceration without foreign body, left lower leg, subsequent encounter: Secondary | ICD-10-CM | POA: Diagnosis not present

## 2020-02-07 DIAGNOSIS — K7460 Unspecified cirrhosis of liver: Secondary | ICD-10-CM | POA: Diagnosis not present

## 2020-02-08 NOTE — Progress Notes (Signed)
LORICE, LAFAVE ([id number]) Visit Report for 02/07/2020 Arrival Information Details Patient Name: [name], [name]. Date of Service: 02/07/2020 10:45 AM Medical Record Number: [id number] Patient Account Number: [id number] Date of Birth/Sex: February 03, 1936 (84 y.o. F) Treating RN: Cornell Barman Primary Care Byanka Landrus: Otilio Miu Other Clinician: Referring Mckaylee Dimalanta: Otilio Miu Treating Felton Buczynski/Extender: Tito Dine in Treatment: 3 Visit Information History Since Last Visit Added or deleted any medications: No Patient Arrived: Ambulatory Any new allergies or adverse reactions: No Arrival Time: 11:13 Had a fall or experienced change in No Accompanied By: self activities of daily living that may affect Transfer Assistance: None risk of falls: Patient Identification Verified: Yes Signs or symptoms of abuse/neglect since last visito No Secondary Verification Process Completed: Yes Hospitalized since last visit: No Patient Requires Transmission-Based Precautions: No Implantable device outside of the clinic excluding No Patient Has Alerts: No cellular tissue based products placed in the center since last visit: Has Dressing in Place as Prescribed: Yes Pain Present Now: Yes Electronic Signature(s) Signed: 02/07/2020 3:31:30 PM By: Lorine Bears RCP, RRT, CHT Entered By: Lorine Bears on 02/07/2020 11:19:32 [name] ([id number]) -------------------------------------------------------------------------------- Encounter Discharge Information Details Patient Name: [name]. Date of Service: 02/07/2020 10:45 AM Medical Record Number: [id number] Patient Account Number: [id number] Date of Birth/Sex: 1935/12/18 (84 y.o. F) Treating RN: Cornell Barman Primary Care Yeshaya Vath: Otilio Miu Other Clinician: Referring Mylen Mangan: Otilio Miu Treating Corban Kistler/Extender: Tito Dine in Treatment: 3 Encounter Discharge Information Items Post  Procedure Vitals Discharge Condition: Stable Temperature (F): 98.3 Ambulatory Status: Ambulatory Pulse (bpm): 84 Discharge Destination: Home Respiratory Rate (breaths/min): 18 Transportation: Private Auto Blood Pressure (mmHg): 144/89 Accompanied By: self Schedule Follow-up Appointment: No Clinical Summary of Care: Electronic Signature(s) Signed: 02/07/2020 6:08:23 PM By: Gretta Cool, BSN, RN, CWS, Kim RN, BSN Entered By: Gretta Cool, BSN, RN, CWS, Kim on 02/07/2020 12:00:22 [name] ([id number]) -------------------------------------------------------------------------------- Lower Extremity Assessment Details Patient Name: [name]. Date of Service: 02/07/2020 10:45 AM Medical Record Number: [id number] Patient Account Number: [id number] Date of Birth/Sex: 04-Nov-1935 (84 y.o. F) Treating RN: Cornell Barman Primary Care Dariane Natzke: Otilio Miu Other Clinician: Referring Nyaira Hodgens: Otilio Miu Treating Brighton Delio/Extender: Tito Dine in Treatment: 3 Edema Assessment Assessed: [Left: Yes] [Right: No] Edema: [Left: Ye] [Right: s] Calf Left: Right: Point of Measurement: 25 cm From Medial Instep 28 cm cm Ankle Left: Right: Point of Measurement: 12 cm From Medial Instep 21 cm cm Vascular Assessment Pulses: Dorsalis Pedis Palpable: [Left:Yes] Posterior Tibial Palpable: [Left:Yes] Electronic Signature(s) Signed: 02/07/2020 6:08:23 PM By: Gretta Cool, BSN, RN, CWS, Kim RN, BSN Signed: 02/08/2020 1:36:53 PM By: Darci Needle Entered By: Darci Needle on 02/07/2020 11:42:28 [name] ([id number]) -------------------------------------------------------------------------------- Multi Wound Chart Details Patient Name: [name]. Date of Service: 02/07/2020 10:45 AM Medical Record Number: [id number] Patient Account Number: [id number] Date of Birth/Sex: Nov 02, 1935 (84 y.o. F) Treating RN: Cornell Barman Primary Care Dillard Pascal: Otilio Miu Other Clinician: Referring  Kearah Gayden: Otilio Miu Treating Khale Nigh/Extender: Tito Dine in Treatment: 3 Vital Signs Height(in): 65 Pulse(bpm): 71 Weight(lbs): 140 Blood Pressure(mmHg): 144/89 Body Mass Index(BMI): 23 Temperature(F): 98.3 Respiratory Rate(breaths/min): 18 Photos: [N/A:N/A] Wound Location: Left, Medial Lower Leg N/A N/A Wounding Event: Trauma N/A N/A Primary Etiology: Trauma, Other N/A N/A Date Acquired: 12/25/2019 N/A N/A Weeks of Treatment: 3 N/A N/A Wound Status: Open N/A N/A Measurements L x W x D (cm) 4.5x1.2x0.2 N/A N/A Area (cm) : 4.241 N/A N/A Volume (cm) : 0.848 N/A N/A % Reduction  in Area: 64.00% N/A N/A % Reduction in Volume: 28.00% N/A N/A Classification: Full Thickness Without Exposed N/A N/A Support Structures Exudate Amount: Medium N/A N/A Exudate Type: Serosanguineous N/A N/A Exudate Color: red, brown N/A N/A Wound Margin: Flat and Intact N/A N/A Granulation Amount: Medium (34-66%) N/A N/A Granulation Quality: Red N/A N/A Necrotic Amount: Medium (34-66%) N/A N/A Necrotic Tissue: Eschar, Adherent Slough N/A N/A Exposed Structures: Fat Layer (Subcutaneous Tissue): N/A N/A Yes Fascia: No Tendon: No Muscle: No Joint: No Bone: No Epithelialization: Small (1-33%) N/A N/A Debridement: Debridement - Excisional N/A N/A Pre-procedure Verification/Time 11:47 N/A N/A Out Taken: Pain Control: Lidocaine N/A N/A Tissue Debrided: Subcutaneous, Slough N/A N/A Level: Skin/Subcutaneous Tissue N/A N/A Debridement Area (sq cm): 5.4 N/A N/A Instrument: Curette N/A N/A Bleeding: Minimum N/A N/A Hemostasis Achieved: Pressure N/A N/A Debridement Treatment Procedure was tolerated well N/A N/A Response: 4.5x1.2x0.2 N/A N/A ESMERALDA, MALAY ([id number]) Post Debridement Measurements L x W x D (cm) Post Debridement Volume: 0.848 N/A N/A (cm) Procedures Performed: Debridement N/A N/A Treatment Notes Wound #2 (Left, Medial Lower Leg) Notes Hydrofera Blue and  tubi-grip. PAtient to wear her own stocking. Electronic Signature(s) Signed: 02/07/2020 3:44:02 PM By: Linton Ham MD Entered By: Linton Ham on 02/07/2020 12:09:32 [name] ([id number]) -------------------------------------------------------------------------------- [place] Details Patient Name: [name]. Date of Service: 02/07/2020 10:45 AM Medical Record Number: [id number] Patient Account Number: [id number] Date of Birth/Sex: 06/16/1935 (84 y.o. F) Treating RN: Cornell Barman Primary Care Javarian Jakubiak: Otilio Miu Other Clinician: Referring Jasyn Mey: Otilio Miu Treating Danalee Flath/Extender: Tito Dine in Treatment: 3 Active Inactive Necrotic Tissue Nursing Diagnoses: Impaired tissue integrity related to necrotic/devitalized tissue Goals: Necrotic/devitalized tissue will be minimized in the wound bed Date Initiated: 01/17/2020 Target Resolution Date: 01/26/2020 Goal Status: Active Interventions: Assess patient pain level pre-, during and post procedure and prior to discharge Treatment Activities: Excisional debridement : 01/17/2020 Notes: Wound/Skin Impairment Nursing Diagnoses: Impaired tissue integrity Goals: Patient/caregiver will verbalize understanding of skin care regimen Date Initiated: 01/17/2020 Target Resolution Date: 01/26/2020 Goal Status: Active Ulcer/skin breakdown will have a volume reduction of 30% by week 4 Date Initiated: 01/17/2020 Target Resolution Date: 02/18/2020 Goal Status: Active Interventions: Provide education on smoking Provide education on ulcer and skin care Treatment Activities: Topical wound management initiated : 01/17/2020 Notes: Electronic Signature(s) Signed: 02/07/2020 6:08:23 PM By: Gretta Cool, BSN, RN, CWS, Kim RN, BSN Entered By: Gretta Cool, BSN, RN, CWS, Kim on 02/07/2020 11:47:21 [name] ([id number]) -------------------------------------------------------------------------------- Pain  Assessment Details Patient Name: [name]. Date of Service: 02/07/2020 10:45 AM Medical Record Number: [id number] Patient Account Number: [id number] Date of Birth/Sex: 1935/06/14 (84 y.o. F) Treating RN: Grover Canavan Primary Care Corina Stacy: Otilio Miu Other Clinician: Referring Nesta Scaturro: Otilio Miu Treating Kennen Stammer/Extender: Tito Dine in Treatment: 3 Active Problems Location of Pain Severity and Description of Pain Patient Has Paino Yes Site Locations Duration of the Pain. Constant / Intermittento Intermittent Rate the pain. Current Pain Level: 2 Character of Pain Describe the Pain: Aching Pain Management and Medication Current Pain Management: Rest: Yes Electronic Signature(s) Signed: 02/07/2020 12:04:31 PM By: Grover Canavan Entered By: Grover Canavan on 02/07/2020 11:26:04 [name] ([id number]) -------------------------------------------------------------------------------- Patient/Caregiver Education Details Patient Name: [name]. Date of Service: 02/07/2020 10:45 AM Medical Record Number: [id number] Patient Account Number: [id number] Date of Birth/Gender: August 17, 1935 (84 y.o. F) Treating RN: Cornell Barman Primary Care Physician: Otilio Miu Other Clinician: Referring Physician: Otilio Miu Treating Physician/Extender: Ricard Dillon Weeks in Treatment:  3 Education Assessment Education Provided To: Patient Education Topics Provided Venous: Handouts: Controlling Swelling with Multilayered Compression Wraps Methods: Demonstration, Explain/Verbal Responses: State content correctly Wound/Skin Impairment: Handouts: Caring for Your Ulcer Methods: Demonstration, Explain/Verbal Responses: State content correctly Electronic Signature(s) Signed: 02/07/2020 6:08:23 PM By: Gretta Cool, BSN, RN, CWS, Kim RN, BSN Entered By: Gretta Cool, BSN, RN, CWS, Kim on 02/07/2020 11:58:07 [name]  ([id number]) -------------------------------------------------------------------------------- Wound Assessment Details Patient Name: [name]. Date of Service: 02/07/2020 10:45 AM Medical Record Number: [id number] Patient Account Number: [id number] Date of Birth/Sex: 1936/01/10 (84 y.o. F) Treating RN: Cornell Barman Primary Care Ashawn Rinehart: Otilio Miu Other Clinician: Referring Alaynna Kerwood: Otilio Miu Treating Odester Nilson/Extender: Tito Dine in Treatment: 3 Wound Status Wound Number: 2 Primary Etiology: Trauma, Other Wound Location: Left, Medial Lower Leg Wound Status: Open Wounding Event: Trauma Date Acquired: 12/25/2019 Weeks Of Treatment: 3 Clustered Wound: No Photos Wound Measurements Length: (cm) 4.5 Width: (cm) 1.2 Depth: (cm) 0.2 Area: (cm) 4.241 Volume: (cm) 0.848 % Reduction in Area: 64% % Reduction in Volume: 28% Epithelialization: Small (1-33%) Wound Description Classification: Full Thickness Without Exposed Support Structu Wound Margin: Flat and Intact Exudate Amount: Medium Exudate Type: Serosanguineous Exudate Color: red, brown res Foul Odor After Cleansing: No Slough/Fibrino Yes Wound Bed Granulation Amount: Medium (34-66%) Exposed Structure Granulation Quality: Red Fascia Exposed: No Necrotic Amount: Medium (34-66%) Fat Layer (Subcutaneous Tissue) Exposed: Yes Necrotic Quality: Eschar, Adherent Slough Tendon Exposed: No Muscle Exposed: No Joint Exposed: No Bone Exposed: No Treatment Notes Wound #2 (Left, Medial Lower Leg) Notes Hydrofera Blue and tubi-grip. PAtient to wear her own stocking. Electronic Signature(s) Signed: 02/07/2020 6:08:23 PM By: Gretta Cool, BSN, RN, CWS, Kim RN, BSN LOANY, NEUROTH DMarland Kitchen ([id number]) Signed: 02/08/2020 1:36:53 PM By: Darci Needle Entered By: Darci Needle on 02/07/2020 11:40:22 [name] ([id number]) -------------------------------------------------------------------------------- Vitals  Details Patient Name: [name]. Date of Service: 02/07/2020 10:45 AM Medical Record Number: [id number] Patient Account Number: [id number] Date of Birth/Sex: 10-Apr-1936 (84 y.o. F) Treating RN: Cornell Barman Primary Care Rasheem Figiel: Otilio Miu Other Clinician: Referring Jagger Beahm: Otilio Miu Treating Nayan Proch/Extender: Tito Dine in Treatment: 3 Vital Signs Time Taken: 11:15 Temperature (F): 98.3 Height (in): 65 Pulse (bpm): 84 Weight (lbs): 140 Respiratory Rate (breaths/min): 18 Body Mass Index (BMI): 23.3 Blood Pressure (mmHg): 144/89 Reference Range: 80 - 120 mg / dl Electronic Signature(s) Signed: 02/07/2020 3:31:30 PM By: Lorine Bears RCP, RRT, CHT Entered By: Lorine Bears on 02/07/2020 11:20:17
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
## 2020-02-19 ENCOUNTER — Encounter: Payer: Medicare Other | Admitting: Physician Assistant

## 2020-02-19 ENCOUNTER — Other Ambulatory Visit: Payer: Self-pay

## 2020-02-19 DIAGNOSIS — J449 Chronic obstructive pulmonary disease, unspecified: Secondary | ICD-10-CM | POA: Diagnosis not present

## 2020-02-19 DIAGNOSIS — K7460 Unspecified cirrhosis of liver: Secondary | ICD-10-CM | POA: Diagnosis not present

## 2020-02-19 DIAGNOSIS — S81802A Unspecified open wound, left lower leg, initial encounter: Secondary | ICD-10-CM | POA: Diagnosis not present

## 2020-02-19 DIAGNOSIS — L97812 Non-pressure chronic ulcer of other part of right lower leg with fat layer exposed: Secondary | ICD-10-CM | POA: Diagnosis not present

## 2020-02-19 DIAGNOSIS — Z853 Personal history of malignant neoplasm of breast: Secondary | ICD-10-CM | POA: Diagnosis not present

## 2020-02-19 DIAGNOSIS — Z87442 Personal history of urinary calculi: Secondary | ICD-10-CM | POA: Diagnosis not present

## 2020-02-19 DIAGNOSIS — L97822 Non-pressure chronic ulcer of other part of left lower leg with fat layer exposed: Secondary | ICD-10-CM | POA: Diagnosis not present

## 2020-02-19 DIAGNOSIS — S81812D Laceration without foreign body, left lower leg, subsequent encounter: Secondary | ICD-10-CM | POA: Diagnosis not present

## 2020-02-19 DIAGNOSIS — I872 Venous insufficiency (chronic) (peripheral): Secondary | ICD-10-CM | POA: Diagnosis not present

## 2020-02-19 NOTE — Progress Notes (Signed)
CEILIDH, TORREGROSSA ([id number]) Visit Report for 02/19/2020 Arrival Information Details Patient Name: [name], [name]. Date of Service: 02/19/2020 10:00 AM Medical Record Number: [id number] Patient Account Number: [id number] Date of Birth/Sex: 03-11-1936 (84 y.o. F) Treating RN: Grover Canavan Primary Care Kemoni Ortega: Otilio Miu Other Clinician: Referring Anaiah Mcmannis: Otilio Miu Treating Jalesia Loudenslager/Extender: Melburn Hake, HOYT Weeks in Treatment: 4 Visit Information History Since Last Visit Added or deleted any medications: No Patient Arrived: Ambulatory Any new allergies or adverse reactions: No Arrival Time: 09:46 Had a fall or experienced change in No Accompanied By: self activities of daily living that may affect Transfer Assistance: None risk of falls: Patient Requires Transmission-Based Precautions: No Signs or symptoms of abuse/neglect since last visito No Patient Has Alerts: No Hospitalized since last visit: No Implantable device outside of the clinic excluding No cellular tissue based products placed in the center since last visit: Pain Present Now: Yes Notes Patient has a new wound - level 5. Electronic Signature(s) Signed: 02/19/2020 4:27:49 PM By: Lorine Bears RCP, RRT, CHT Entered By: Lorine Bears on 02/19/2020 09:48:46 [name] ([id number]) -------------------------------------------------------------------------------- Clinic Level of Care Assessment Details Patient Name: [name]. Date of Service: 02/19/2020 10:00 AM Medical Record Number: [id number] Patient Account Number: [id number] Date of Birth/Sex: 1935-09-12 (84 y.o. F) Treating RN: Grover Canavan Primary Care Lyssa Hackley: Otilio Miu Other Clinician: Referring Lillyanna Glandon: Otilio Miu Treating Jacki Couse/Extender: Melburn Hake, HOYT Weeks in Treatment: 4 Clinic Level of Care Assessment Items TOOL 4 Quantity Score []  - Use when only an EandM is performed on FOLLOW-UP  visit 0 ASSESSMENTS - Nursing Assessment / Reassessment X - Reassessment of Co-morbidities (includes updates in patient status) 1 10 X- 1 5 Reassessment of Adherence to Treatment Plan ASSESSMENTS - Wound and Skin Assessment / Reassessment []  - Simple Wound Assessment / Reassessment - one wound 0 X- 2 5 Complex Wound Assessment / Reassessment - multiple wounds []  - 0 Dermatologic / Skin Assessment (not related to wound area) ASSESSMENTS - Focused Assessment X - Circumferential Edema Measurements - multi extremities 2 5 []  - 0 Nutritional Assessment / Counseling / Intervention X- 1 5 Lower Extremity Assessment (monofilament, tuning fork, pulses) []  - 0 Peripheral Arterial Disease Assessment (using hand held doppler) ASSESSMENTS - Ostomy and/or Continence Assessment and Care []  - Incontinence Assessment and Management 0 []  - 0 Ostomy Care Assessment and Management (repouching, etc.) PROCESS - Coordination of Care X - Simple Patient / Family Education for ongoing care 1 15 []  - 0 Complex (extensive) Patient / Family Education for ongoing care []  - 0 Staff obtains Programmer, systems, Records, Test Results / Process Orders []  - 0 Staff telephones HHA, Nursing Homes / Clarify orders / etc []  - 0 Routine Transfer to another Facility (non-emergent condition) []  - 0 Routine Hospital Admission (non-emergent condition) []  - 0 New Admissions / Biomedical engineer / Ordering NPWT, Apligraf, etc. []  - 0 Emergency Hospital Admission (emergent condition) X- 1 10 Simple Discharge Coordination []  - 0 Complex (extensive) Discharge Coordination PROCESS - Special Needs []  - Pediatric / Minor Patient Management 0 []  - 0 Isolation Patient Management []  - 0 Hearing / Language / Visual special needs []  - 0 Assessment of Community assistance (transportation, D/C planning, etc.) []  - 0 Additional assistance / Altered mentation []  - 0 Support Surface(s) Assessment (bed, cushion, seat,  etc.) INTERVENTIONS - Wound Cleansing / Measurement [name], [name]. ([id number]) []  - 0 Simple Wound Cleansing - one wound X- 2 5 Complex Wound Cleansing - multiple  wounds []  - 0 Wound Imaging (photographs - any number of wounds) X- 1 5 Wound Tracing (instead of photographs) []  - 0 Simple Wound Measurement - one wound X- 2 5 Complex Wound Measurement - multiple wounds INTERVENTIONS - Wound Dressings X - Small Wound Dressing one or multiple wounds 2 10 []  - 0 Medium Wound Dressing one or multiple wounds []  - 0 Large Wound Dressing one or multiple wounds []  - 0 Application of Medications - topical []  - 0 Application of Medications - injection INTERVENTIONS - Miscellaneous []  - External ear exam 0 []  - 0 Specimen Collection (cultures, biopsies, blood, body fluids, etc.) []  - 0 Specimen(s) / Culture(s) sent or taken to Lab for analysis []  - 0 Patient Transfer (multiple staff / Civil Service fast streamer / Similar devices) []  - 0 Simple Staple / Suture removal (25 or less) []  - 0 Complex Staple / Suture removal (26 or more) []  - 0 Hypo / Hyperglycemic Management (close monitor of Blood Glucose) []  - 0 Ankle / Brachial Index (ABI) - do not check if billed separately X- 1 5 Vital Signs Has the patient been seen at the hospital within the last three years: Yes Total Score: 115 Level Of Care: New/Established - Level 3 Electronic Signature(s) Signed: 02/19/2020 4:33:41 PM By: Grover Canavan Entered By: Grover Canavan on 02/19/2020 10:16:02 [name] ([id number]) -------------------------------------------------------------------------------- Encounter Discharge Information Details Patient Name: [name]. Date of Service: 02/19/2020 10:00 AM Medical Record Number: [id number] Patient Account Number: [id number] Date of Birth/Sex: 18-Nov-1935 (84 y.o. F) Treating RN: Grover Canavan Primary Care Marten Iles: Otilio Miu Other Clinician: Referring Tayshaun Kroh: Otilio Miu Treating Angle Karel/Extender: Melburn Hake, HOYT Weeks in Treatment: 4 Encounter Discharge Information Items Discharge Condition: Stable Ambulatory Status: Ambulatory Discharge Destination: Home Transportation: Private Auto Accompanied By: self Schedule Follow-up Appointment: Yes Clinical Summary of Care: Electronic Signature(s) Signed: 02/19/2020 4:33:41 PM By: Grover Canavan Entered By: Grover Canavan on 02/19/2020 10:16:56 [name] ([id number]) -------------------------------------------------------------------------------- Lower Extremity Assessment Details Patient Name: [name]. Date of Service: 02/19/2020 10:00 AM Medical Record Number: [id number] Patient Account Number: [id number] Date of Birth/Sex: [birth year] (84 y.o. F) Treating RN: Grover Canavan Primary Care Sheelah Ritacco: Otilio Miu Other Clinician: Referring Erik Nessel: Otilio Miu Treating Maclin Guerrette/Extender: STONE III, HOYT Weeks in Treatment: 4 Edema Assessment Assessed: [Left: Yes] [Right: Yes] Edema: [Left: Ye] [Right: s] Calf Left: Right: Point of Measurement: cm From Medial Instep 33.5 cm 35.5 cm Ankle Left: Right: Point of Measurement: cm From Medial Instep 24.5 cm 24 cm Vascular Assessment Pulses: Dorsalis Pedis Palpable: [Left:Yes] [Right:Yes] Posterior Tibial Palpable: [Left:Yes] [Right:Yes] Electronic Signature(s) Signed: 02/19/2020 4:33:41 PM By: Grover Canavan Entered By: Grover Canavan on 02/19/2020 10:03:37 [name] ([id number]) -------------------------------------------------------------------------------- Multi Wound Chart Details Patient Name: Summer Lovely D. Date of Service: 02/19/2020 10:00 AM Medical Record Number: [id number] Patient Account Number: [id number] Date of Birth/Sex: January 13, 1936 (84 y.o. F) Treating RN: Grover Canavan Primary Care Zyniah Ferraiolo: Otilio Miu Other Clinician: Referring Ludy Messamore: Otilio Miu Treating Nataline Basara/Extender: STONE  III, HOYT Weeks in Treatment: 4 Vital Signs Height(in): 65 Pulse(bpm): 7 Weight(lbs): 140 Blood Pressure(mmHg): 139/81 Body Mass Index(BMI): 23 Temperature(F): 98.1 Respiratory Rate(breaths/min): 18 Photos: [N/A:N/A] Wound Location: Left, Medial Lower Leg Right, Lateral Lower Leg N/A Wounding Event: Trauma Trauma N/A Primary Etiology: Trauma, Other Skin Tear N/A Date Acquired: 12/25/2019 02/16/2020 N/A Weeks of Treatment: 4 0 N/A Wound Status: Open Open N/A Measurements L x W x D (cm) 0.3x0.2x0.1 2.8x2x0.1 N/A Area (cm) : 0.047 4.398 N/A Volume (cm) :  0.005 0.44 N/A % Reduction in Area: 99.60% N/A N/A % Reduction in Volume: 99.60% N/A N/A Classification: Full Thickness Without Exposed Partial Thickness N/A Support Structures Exudate Amount: Medium Medium N/A Exudate Type: Serosanguineous Serosanguineous N/A Exudate Color: red, brown red, brown N/A Wound Margin: Flat and Intact Flat and Intact N/A Granulation Amount: Small (1-33%) None Present (0%) N/A Granulation Quality: Red N/A N/A Necrotic Amount: Small (1-33%) None Present (0%) N/A Exposed Structures: Fat Layer (Subcutaneous Tissue): Fat Layer (Subcutaneous Tissue): N/A Yes Yes Fascia: No Fascia: No Tendon: No Tendon: No Muscle: No Muscle: No Joint: No Joint: No Bone: No Bone: No Epithelialization: Large (67-100%) None N/A Treatment Notes Electronic Signature(s) Signed: 02/19/2020 4:33:41 PM By: Grover Canavan Entered By: Grover Canavan on 02/19/2020 10:08:48 [name] ([id number]) -------------------------------------------------------------------------------- Multi-Disciplinary Care Plan Details Patient Name: [name]. Date of Service: 02/19/2020 10:00 AM Medical Record Number: [id number] Patient Account Number: [id number] Date of Birth/Sex: January 13, 1936 (84 y.o. F) Treating RN: Grover Canavan Primary Care Cord Wilczynski: Otilio Miu Other Clinician: Referring Misheel Gowans: Otilio Miu Treating Jasiah Buntin/Extender: Melburn Hake, HOYT Weeks in Treatment: 4 Active Inactive Necrotic Tissue Nursing Diagnoses: Impaired tissue integrity related to necrotic/devitalized tissue Goals: Necrotic/devitalized tissue will be minimized in the wound bed Date Initiated: 01/17/2020 Target Resolution Date: 01/26/2020 Goal Status: Active Interventions: Assess patient pain level pre-, during and post procedure and prior to discharge Treatment Activities: Excisional debridement : 01/17/2020 Notes: Wound/Skin Impairment Nursing Diagnoses: Impaired tissue integrity Goals: Patient/caregiver will verbalize understanding of skin care regimen Date Initiated: 01/17/2020 Target Resolution Date: 01/26/2020 Goal Status: Active Ulcer/skin breakdown will have a volume reduction of 30% by week 4 Date Initiated: 01/17/2020 Target Resolution Date: 02/18/2020 Goal Status: Active Interventions: Provide education on smoking Provide education on ulcer and skin care Treatment Activities: Topical wound management initiated : 01/17/2020 Notes: Electronic Signature(s) Signed: 02/19/2020 4:33:41 PM By: Grover Canavan Entered By: Grover Canavan on 02/19/2020 10:08:40 [name] ([id number]) -------------------------------------------------------------------------------- Pain Assessment Details Patient Name: [name]. Date of Service: 02/19/2020 10:00 AM Medical Record Number: [id number] Patient Account Number: [id number] Date of Birth/Sex: 10/02/1935 (84 y.o. F) Treating RN: Grover Canavan Primary Care Ahja Martello: Otilio Miu Other Clinician: Referring Sollie Vultaggio: Otilio Miu Treating Lucely Leard/Extender: Melburn Hake, HOYT Weeks in Treatment: 4 Active Problems Location of Pain Severity and Description of Pain Patient Has Paino No Site Locations Pain Management and Medication Current Pain Management: Electronic Signature(s) Signed: 02/19/2020 4:33:41 PM By: Grover Canavan Entered  By: Grover Canavan on 02/19/2020 09:52:50 [name] ([id number]) -------------------------------------------------------------------------------- Patient/Caregiver Education Details Patient Name: [name]. Date of Service: 02/19/2020 10:00 AM Medical Record Number: [id number] Patient Account Number: [id number] Date of Birth/Gender: 1935-07-20 (84 y.o. F) Treating RN: Grover Canavan Primary Care Physician: Otilio Miu Other Clinician: Referring Physician: Otilio Miu Treating Physician/Extender: Melburn Hake, HOYT Weeks in Treatment: 4 Education Assessment Education Provided To: Patient Education Topics Provided Wound/Skin Impairment: Methods: Explain/Verbal Responses: State content correctly Electronic Signature(s) Signed: 02/19/2020 4:33:41 PM By: Grover Canavan Entered By: Grover Canavan on 02/19/2020 10:09:38 [name] ([id number]) -------------------------------------------------------------------------------- Wound Assessment Details Patient Name: [name]. Date of Service: 02/19/2020 10:00 AM Medical Record Number: [id number] Patient Account Number: [id number] Date of Birth/Sex: 1936/02/23 (84 y.o. F) Treating RN: Grover Canavan Primary Care Charika Mikelson: Otilio Miu Other Clinician: Referring Aldric Wenzler: Otilio Miu Treating Alissa Pharr/Extender: STONE III, HOYT Weeks in Treatment: 4 Wound Status Wound Number: 2 Primary Etiology: Trauma, Other Wound Location: Left, Medial Lower Leg Wound Status: Open Wounding Event: Trauma Date Acquired: 12/25/2019  Weeks Of Treatment: 4 Clustered Wound: No Photos Wound Measurements Length: (cm) 0.3 Width: (cm) 0.2 Depth: (cm) 0.1 Area: (cm) 0.047 Volume: (cm) 0.005 % Reduction in Area: 99.6% % Reduction in Volume: 99.6% Epithelialization: Large (67-100%) Tunneling: No Undermining: No Wound Description Classification: Full Thickness Without Exposed Support Stru Wound Margin: Flat and  Intact Exudate Amount: Medium Exudate Type: Serosanguineous Exudate Color: red, brown ctures Foul Odor After Cleansing: No Slough/Fibrino Yes Wound Bed Granulation Amount: Small (1-33%) Exposed Structure Granulation Quality: Red Fascia Exposed: No Necrotic Amount: Small (1-33%) Fat Layer (Subcutaneous Tissue) Exposed: Yes Necrotic Quality: Adherent Slough Tendon Exposed: No Muscle Exposed: No Joint Exposed: No Bone Exposed: No Treatment Notes Wound #2 (Left, Medial Lower Leg) Notes vaseline gauze, bfd Electronic Signature(s) Signed: 02/19/2020 4:33:41 PM By: Rexene Alberts ([id number]) Entered By: Grover Canavan on 02/19/2020 10:02:50 [name] ([id number]) -------------------------------------------------------------------------------- Wound Assessment Details Patient Name: Summer Lovely D. Date of Service: 02/19/2020 10:00 AM Medical Record Number: [id number] Patient Account Number: [id number] Date of Birth/Sex: [birth year] (84 y.o. F) Treating RN: Grover Canavan Primary Care Abdalla Naramore: Otilio Miu Other Clinician: Referring Merit Gadsby: Otilio Miu Treating Ianna Salmela/Extender: STONE III, HOYT Weeks in Treatment: 4 Wound Status Wound Number: 3 Primary Etiology: Skin Tear Wound Location: Right, Lateral Lower Leg Wound Status: Open Wounding Event: Trauma Date Acquired: 02/16/2020 Weeks Of Treatment: 0 Clustered Wound: No Photos Wound Measurements Length: (cm) 2.8 % R Width: (cm) 2 % R Depth: (cm) 0.1 Epi Area: (cm) 4.398 Tu Volume: (cm) 0.44 Un eduction in Area: eduction in Volume: thelialization: None nneling: No dermining: No Wound Description Classification: Partial Thickness Fo Wound Margin: Flat and Intact Sl Exudate Amount: Medium Exudate Type: Serosanguineous Exudate Color: red, brown ul Odor After Cleansing: No ough/Fibrino No Wound Bed Granulation Amount: None Present (0%) Exposed Structure Necrotic Amount: None  Present (0%) Fascia Exposed: No Fat Layer (Subcutaneous Tissue) Exposed: Yes Tendon Exposed: No Muscle Exposed: No Joint Exposed: No Bone Exposed: No Treatment Notes Wound #3 (Right, Lateral Lower Leg) Notes vaseline gauze, bfd Electronic Signature(s) Signed: 02/19/2020 4:33:41 PM By: Rexene Alberts ([id number]) Entered By: Grover Canavan on 02/19/2020 10:05:59 [name] ([id number]) -------------------------------------------------------------------------------- Vitals Details Patient Name: [name]. Date of Service: 02/19/2020 10:00 AM Medical Record Number: [id number] Patient Account Number: [id number] Date of Birth/Sex: Jan 18, 1936 (84 y.o. F) Treating RN: Grover Canavan Primary Care Glora Hulgan: Otilio Miu Other Clinician: Referring Ismael Treptow: Otilio Miu Treating Jerelle Virden/Extender: STONE III, HOYT Weeks in Treatment: 4 Vital Signs Time Taken: 09:45 Temperature (F): 98.1 Height (in): 65 Pulse (bpm): 79 Weight (lbs): 140 Respiratory Rate (breaths/min): 18 Body Mass Index (BMI): 23.3 Blood Pressure (mmHg): 139/81 Reference Range: 80 - 120 mg / dl Electronic Signature(s) Signed: 02/19/2020 4:27:49 PM By: Lorine Bears RCP, RRT, CHT Entered By: Lorine Bears on 02/19/2020 09:49:54

## 2020-02-19 NOTE — Progress Notes (Addendum)
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 week here in the clinic. If anything worsens or changes patient will contact our office for additional recommendations. Electronic Signature(s) Signed: 02/19/2020 3:22:34 PM By: Worthy Keeler PA-C Entered By: Worthy Keeler on 02/19/2020 15:22:33 [name] ([id number]) -------------------------------------------------------------------------------- SuperBill Details Patient Name: [name]. Date of Service: 02/19/2020 Medical Record Number: [id number] Patient Account Number: [id number] Date of Birth/Sex: [birth year] (84 y.o. F) Treating RN: Grover Canavan Primary Care Provider: Otilio Miu Other Clinician: Referring Provider: Otilio Miu Treating Provider/Extender: Melburn Hake, Jennaya Pogue Weeks in Treatment: 4 Diagnosis Coding ICD-10 Codes Code Description [id number] Laceration without foreign body, left lower leg, subsequent encounter L97.822 Non-pressure chronic ulcer of other part of left lower leg with fat layer exposed L97.921 Non-pressure chronic ulcer of  unspecified part of left lower leg limited to breakdown of skin I87.323 Chronic venous hypertension (idiopathic) with inflammation of bilateral lower extremity S81.811A Laceration without foreign body, right lower leg, initial encounter Facility Procedures CPT4 Code: [id number] Description: 99213 - WOUND CARE VISIT-LEV 3 EST PT Modifier: Quantity: 1 Physician Procedures CPT4 Code: [id number] Description: 62836 - WC PHYS LEVEL 3 - EST PT Modifier: Quantity: 1 CPT4 Code: Description: ICD-10 Diagnosis Description S81.812D Laceration without foreign body, left lower leg, subsequent encounter L97.822 Non-pressure chronic ulcer of other part of left lower leg with fat layer L97.921 Non-pressure chronic ulcer of unspecified  part of left lower leg limited I87.323 Chronic venous hypertension (idiopathic) with inflammation of bilateral l Modifier: exposed to breakdown of skin ower extremity Quantity: Electronic Signature(s) Signed: 02/19/2020 3:22:54 PM By: Worthy Keeler PA-C Entered By: Worthy Keeler on 02/19/2020 15:22:54

## 2020-02-28 ENCOUNTER — Ambulatory Visit: Payer: Medicare Other | Admitting: Internal Medicine

## 2020-03-19 ENCOUNTER — Ambulatory Visit: Payer: Medicare Other | Admitting: Family Medicine

## 2020-03-25 ENCOUNTER — Other Ambulatory Visit: Payer: Self-pay | Admitting: Nurse Practitioner

## 2020-03-25 DIAGNOSIS — Z1231 Encounter for screening mammogram for malignant neoplasm of breast: Secondary | ICD-10-CM

## 2020-03-26 ENCOUNTER — Other Ambulatory Visit (HOSPITAL_COMMUNITY): Payer: Self-pay | Admitting: Nurse Practitioner

## 2020-03-26 ENCOUNTER — Other Ambulatory Visit: Payer: Self-pay | Admitting: Nurse Practitioner

## 2020-03-26 DIAGNOSIS — K7460 Unspecified cirrhosis of liver: Secondary | ICD-10-CM

## 2020-04-01 ENCOUNTER — Encounter (INDEPENDENT_AMBULATORY_CARE_PROVIDER_SITE_OTHER): Payer: Self-pay

## 2020-04-01 ENCOUNTER — Ambulatory Visit
Admission: RE | Admit: 2020-04-01 | Discharge: 2020-04-01 | Disposition: A | Discharge status: Home / Self Care | Payer: Medicare Other | Source: Ambulatory Visit | Attending: Nurse Practitioner | Admitting: Nurse Practitioner

## 2020-04-01 ENCOUNTER — Other Ambulatory Visit: Payer: Self-pay

## 2020-04-01 DIAGNOSIS — K7460 Unspecified cirrhosis of liver: Secondary | ICD-10-CM | POA: Insufficient documentation

## 2020-04-16 ENCOUNTER — Ambulatory Visit
Admission: RE | Admit: 2020-04-16 | Discharge: 2020-04-16 | Disposition: A | Discharge status: Home / Self Care | Payer: Medicare Other | Source: Ambulatory Visit | Attending: Nurse Practitioner | Admitting: Nurse Practitioner

## 2020-04-16 ENCOUNTER — Other Ambulatory Visit: Payer: Self-pay

## 2020-04-16 DIAGNOSIS — Z1231 Encounter for screening mammogram for malignant neoplasm of breast: Secondary | ICD-10-CM

## 2020-05-07 ENCOUNTER — Ambulatory Visit: Payer: Medicare Other | Admitting: Family Medicine

## 2020-06-01 ENCOUNTER — Inpatient Hospital Stay
Admission: EM | Admit: 2020-06-01 | Discharge: 2020-06-04 | DRG: 176 | Disposition: A | Discharge status: Home / Self Care | Payer: Medicare Other | Attending: Internal Medicine | Admitting: Internal Medicine

## 2020-06-01 ENCOUNTER — Emergency Department: Payer: Medicare Other

## 2020-06-01 ENCOUNTER — Other Ambulatory Visit: Payer: Self-pay

## 2020-06-01 DIAGNOSIS — R188 Other ascites: Secondary | ICD-10-CM | POA: Diagnosis present

## 2020-06-01 DIAGNOSIS — I2699 Other pulmonary embolism without acute cor pulmonale: Principal | ICD-10-CM | POA: Diagnosis present

## 2020-06-01 DIAGNOSIS — Z91041 Radiographic dye allergy status: Secondary | ICD-10-CM

## 2020-06-01 DIAGNOSIS — Z886 Allergy status to analgesic agent status: Secondary | ICD-10-CM

## 2020-06-01 DIAGNOSIS — W1830XA Fall on same level, unspecified, initial encounter: Secondary | ICD-10-CM | POA: Diagnosis present

## 2020-06-01 DIAGNOSIS — Z79891 Long term (current) use of opiate analgesic: Secondary | ICD-10-CM

## 2020-06-01 DIAGNOSIS — Z923 Personal history of irradiation: Secondary | ICD-10-CM

## 2020-06-01 DIAGNOSIS — K219 Gastro-esophageal reflux disease without esophagitis: Secondary | ICD-10-CM | POA: Diagnosis present

## 2020-06-01 DIAGNOSIS — Z7952 Long term (current) use of systemic steroids: Secondary | ICD-10-CM

## 2020-06-01 DIAGNOSIS — Z853 Personal history of malignant neoplasm of breast: Secondary | ICD-10-CM

## 2020-06-01 DIAGNOSIS — I4821 Permanent atrial fibrillation: Secondary | ICD-10-CM | POA: Diagnosis present

## 2020-06-01 DIAGNOSIS — L03116 Cellulitis of left lower limb: Secondary | ICD-10-CM | POA: Diagnosis present

## 2020-06-01 DIAGNOSIS — Z85828 Personal history of other malignant neoplasm of skin: Secondary | ICD-10-CM

## 2020-06-01 DIAGNOSIS — J449 Chronic obstructive pulmonary disease, unspecified: Secondary | ICD-10-CM | POA: Diagnosis present

## 2020-06-01 DIAGNOSIS — I10 Essential (primary) hypertension: Secondary | ICD-10-CM | POA: Diagnosis present

## 2020-06-01 DIAGNOSIS — R5381 Other malaise: Secondary | ICD-10-CM | POA: Diagnosis present

## 2020-06-01 DIAGNOSIS — Z20822 Contact with and (suspected) exposure to covid-19: Secondary | ICD-10-CM | POA: Diagnosis present

## 2020-06-01 DIAGNOSIS — M1990 Unspecified osteoarthritis, unspecified site: Secondary | ICD-10-CM | POA: Diagnosis present

## 2020-06-01 DIAGNOSIS — Z859 Personal history of malignant neoplasm, unspecified: Secondary | ICD-10-CM

## 2020-06-01 DIAGNOSIS — I700 Atherosclerosis of aorta: Secondary | ICD-10-CM | POA: Diagnosis present

## 2020-06-01 DIAGNOSIS — K5730 Diverticulosis of large intestine without perforation or abscess without bleeding: Secondary | ICD-10-CM | POA: Diagnosis present

## 2020-06-01 DIAGNOSIS — K7460 Unspecified cirrhosis of liver: Secondary | ICD-10-CM | POA: Diagnosis present

## 2020-06-01 DIAGNOSIS — R0602 Shortness of breath: Secondary | ICD-10-CM

## 2020-06-01 DIAGNOSIS — R519 Headache, unspecified: Secondary | ICD-10-CM | POA: Diagnosis present

## 2020-06-01 DIAGNOSIS — R079 Chest pain, unspecified: Secondary | ICD-10-CM

## 2020-06-01 DIAGNOSIS — R7989 Other specified abnormal findings of blood chemistry: Secondary | ICD-10-CM

## 2020-06-01 DIAGNOSIS — Z9049 Acquired absence of other specified parts of digestive tract: Secondary | ICD-10-CM

## 2020-06-01 DIAGNOSIS — Z79899 Other long term (current) drug therapy: Secondary | ICD-10-CM

## 2020-06-01 DIAGNOSIS — Z9221 Personal history of antineoplastic chemotherapy: Secondary | ICD-10-CM

## 2020-06-01 DIAGNOSIS — Z720 Tobacco use: Secondary | ICD-10-CM

## 2020-06-01 DIAGNOSIS — K766 Portal hypertension: Secondary | ICD-10-CM | POA: Diagnosis present

## 2020-06-01 DIAGNOSIS — N3001 Acute cystitis with hematuria: Principal | ICD-10-CM

## 2020-06-01 DIAGNOSIS — F17210 Nicotine dependence, cigarettes, uncomplicated: Secondary | ICD-10-CM | POA: Diagnosis present

## 2020-06-01 DIAGNOSIS — I2510 Atherosclerotic heart disease of native coronary artery without angina pectoris: Secondary | ICD-10-CM | POA: Diagnosis present

## 2020-06-01 LAB — URINALYSIS, COMPLETE (UACMP) WITH MICROSCOPIC
Bilirubin Urine: NEGATIVE
Glucose, UA: NEGATIVE mg/dL
Ketones, ur: NEGATIVE mg/dL
Leukocytes,Ua: NEGATIVE
Nitrite: NEGATIVE
Protein, ur: >=300 mg/dL — AB
Specific Gravity, Urine: 1.034 — ABNORMAL HIGH (ref 1.005–1.030)
pH: 5 (ref 5.0–8.0)

## 2020-06-01 LAB — CBC
HCT: 49.1 % — ABNORMAL HIGH (ref 36.0–46.0)
Hemoglobin: 16 g/dL — ABNORMAL HIGH (ref 12.0–15.0)
MCH: 31.2 pg (ref 26.0–34.0)
MCHC: 32.6 g/dL (ref 30.0–36.0)
MCV: 95.7 fL (ref 80.0–100.0)
Platelets: 269 10*3/uL (ref 150–400)
RBC: 5.13 MIL/uL — ABNORMAL HIGH (ref 3.87–5.11)
RDW: 15.8 % — ABNORMAL HIGH (ref 11.5–15.5)
WBC: 10.1 10*3/uL (ref 4.0–10.5)
nRBC: 0 % (ref 0.0–0.2)

## 2020-06-01 LAB — RESP PANEL BY RT-PCR (FLU A&B, COVID) ARPGX2
Influenza A by PCR: NEGATIVE
Influenza B by PCR: NEGATIVE
SARS Coronavirus 2 by RT PCR: NEGATIVE

## 2020-06-01 LAB — COMPREHENSIVE METABOLIC PANEL
ALT: 32 U/L (ref 0–44)
AST: 44 U/L — ABNORMAL HIGH (ref 15–41)
Albumin: 2.5 g/dL — ABNORMAL LOW (ref 3.5–5.0)
Alkaline Phosphatase: 167 U/L — ABNORMAL HIGH (ref 38–126)
Anion gap: 8 (ref 5–15)
BUN: 35 mg/dL — ABNORMAL HIGH (ref 8–23)
CO2: 27 mmol/L (ref 22–32)
Calcium: 8.5 mg/dL — ABNORMAL LOW (ref 8.9–10.3)
Chloride: 103 mmol/L (ref 98–111)
Creatinine, Ser: 0.83 mg/dL (ref 0.44–1.00)
GFR, Estimated: >60 mL/min (ref >60)
Glucose, Bld: 119 mg/dL — ABNORMAL HIGH (ref 70–99)
Potassium: 4.4 mmol/L (ref 3.5–5.1)
Sodium: 138 mmol/L (ref 135–145)
Total Bilirubin: 1.2 mg/dL (ref 0.3–1.2)
Total Protein: 6.5 g/dL (ref 6.5–8.1)

## 2020-06-01 LAB — TROPONIN I (HIGH SENSITIVITY)
Troponin I (High Sensitivity): 6 ng/L (ref <18)
Troponin I (High Sensitivity): 5 ng/L (ref <18)

## 2020-06-01 LAB — LIPASE, BLOOD: Lipase: 35 U/L (ref 11–51)

## 2020-06-01 LAB — FIBRIN DERIVATIVES D-DIMER (ARMC ONLY): Fibrin derivatives D-dimer (ARMC): >7500 ng/mL (FEU) — ABNORMAL HIGH (ref 0.00–499.00)

## 2020-06-01 LAB — BRAIN NATRIURETIC PEPTIDE: B Natriuretic Peptide: 137.6 pg/mL — ABNORMAL HIGH (ref 0.0–100.0)

## 2020-06-01 MED ORDER — DIPHENHYDRAMINE HCL 50 MG/ML IJ SOLN
50.0000 mg | Freq: Once | INTRAMUSCULAR | Status: AC
  Filled 2020-06-01: qty 1

## 2020-06-01 MED ORDER — LABETALOL HCL 5 MG/ML IV SOLN
10.0000 mg | Freq: Once | INTRAVENOUS | Status: AC
  Administered 2020-06-01: 10 mg via INTRAVENOUS
  Filled 2020-06-01: qty 4

## 2020-06-01 MED ORDER — DIPHENHYDRAMINE HCL 25 MG PO CAPS
50.0000 mg | ORAL_CAPSULE | Freq: Once | ORAL | Status: AC
  Administered 2020-06-02: 50 mg via ORAL
  Filled 2020-06-01: qty 2

## 2020-06-01 MED ORDER — SODIUM CHLORIDE 0.9 % IV SOLN
1.0000 g | Freq: Once | INTRAVENOUS | Status: AC
  Administered 2020-06-01: 1 g via INTRAVENOUS
  Filled 2020-06-01: qty 10

## 2020-06-01 MED ORDER — ACETAMINOPHEN 500 MG PO TABS
1000.0000 mg | ORAL_TABLET | Freq: Once | ORAL | Status: AC
  Administered 2020-06-01: 1000 mg via ORAL
  Filled 2020-06-01: qty 2

## 2020-06-01 MED ORDER — HYDROCORTISONE NA SUCCINATE PF 250 MG IJ SOLR
200.0000 mg | Freq: Once | INTRAMUSCULAR | Status: AC
  Administered 2020-06-01: 200 mg via INTRAVENOUS
  Filled 2020-06-01: qty 200

## 2020-06-01 MED ORDER — SULFAMETHOXAZOLE-TRIMETHOPRIM 800-160 MG PO TABS: 1.0000 | ORAL_TABLET | Freq: Two times a day (BID) | ORAL | 0 refills | Status: DC

## 2020-06-01 NOTE — ED Notes (Signed)
ED Provider at bedside. 

## 2020-06-01 NOTE — ED Notes (Signed)
Helped pt ambulate to hall toilet. Pt ambulated well.

## 2020-06-01 NOTE — ED Notes (Signed)
Pt ambulated to restroom. 

## 2020-06-01 NOTE — ED Notes (Signed)
Solu-Cortef remains unavailable from pharmacy for RN to administer.

## 2020-06-01 NOTE — ED Notes (Signed)
IV team at bed side to start IV. 

## 2020-06-01 NOTE — ED Notes (Signed)
Pharmacy contacted requesting to send ordered Solu-Cortef for RN to administer. Awaiting arrival of medication.

## 2020-06-01 NOTE — ED Notes (Signed)
Date and time results received: 06/01/20  2034  Test: D-Dimer Critical Value: >7500  Name of Provider Notified: Dr. Tamala Julian  Orders Received? Or Actions Taken?: Awaiting response

## 2020-06-01 NOTE — ED Triage Notes (Signed)
Pt comes pov with nausea, headache, and chest pain for about 3 weeks. Pt also has left ankle injury-swelling, bruised, hard to bear weight.

## 2020-06-01 NOTE — ED Notes (Signed)
MD at bedside. 

## 2020-06-01 NOTE — ED Provider Notes (Signed)
[facility] Emergency Department Provider Note  ____________________________________________   Event Date/Time   First MD Initiated Contact with Patient 06/01/20 1531     (approximate)  I have reviewed the triage vital signs and the nursing notes.   HISTORY  Chief Complaint Nausea and Abdominal Pain   HPI [name] is a 85 y.o. female with past medical history of tobacco abuse, breast cancer status post chemo and radiation, and skin cancer who presents accompanied by her husband for assessment of several symptoms seem to be getting worse over the last couple of weeks since the patient states she injured her ankle tripping 3 weeks ago. Patient states that since then she has been able to walk but has significant pain with ankle has had some swelling in her left lower leg. She states that since this time she has had some intermittent chest pains as well as shortness of breath with exertion, headache, nausea, intermittent constipation and some intermittent abdominal pain. She also thinks her urinary frequency has decreased although she denies any pain with urination or blood in her urine. She denies any diarrhea. She denies hitting her head when she fell or any other recent injuries. She states that her chest pain cough and shortness of breath seem to get worse with exertion and she recently stopped quitting smoking November 2021. No significant cardio or pulmonary past medical history. Patient states she has never had a blood clot and is not taking blood thinners. No clear alleviating aggravating factors regarding her symptoms other than exertion. No prior similar episodes.         Past Medical History:  Diagnosis Date  . Arthritis    lower back, knees  . Breast cancer ([place]) 2011   RT LUMPECTOMY  . Chemotherapy follow-up examination 2011   RT BREAST CANCER  . Cirrhosis ([place])   . Personal history of chemotherapy   . Personal history of radiation therapy    . Radiation 2011   RT BREAST CANCER  . Skin cancer   . Wears dentures    full upper, partial lower    Patient Active Problem List   Diagnosis Date Noted  . Breast cancer ([place]) 11/02/2014  . Breast CA ([place]) 01/19/2013    Past Surgical History:  Procedure Laterality Date  . BREAST BIOPSY Right 2014   RT CORE W/CLIP - NEG  . BREAST EXCISIONAL BIOPSY Right 2011   breast ca  . CATARACT EXTRACTION W/PHACO Right 04/15/2016   Procedure: CATARACT EXTRACTION PHACO AND INTRAOCULAR LENS PLACEMENT (IOC);  Surgeon: Leandrew Koyanagi, MD;  Location: [place];  Service: Ophthalmology;  Laterality: Right;  . CATARACT EXTRACTION W/PHACO Left 07/01/2016   Procedure: CATARACT EXTRACTION PHACO AND INTRAOCULAR LENS PLACEMENT (IOC);  Surgeon: Leandrew Koyanagi, MD;  Location: [place];  Service: Ophthalmology;  Laterality: Left;  Left eye  . CHOLECYSTECTOMY      Prior to Admission medications   Medication Sig Start Date End Date Taking? Authorizing Provider  methocarbamol (ROBAXIN) 500 MG tablet Take 1 tablet (500 mg total) by mouth at bedtime as needed for muscle spasms. 11/30/17   Cuthriell, Charline Bills, PA-C  predniSONE (DELTASONE) 10 MG tablet Take 1 tablet (10 mg total) by mouth daily. 11/30/17   Cuthriell, Charline Bills, PA-C  Pseudoephedrine-Guaifenesin (MUCINEX D PO) Take by mouth as needed.    [provider]  traMADol (ULTRAM) 50 MG tablet Take 1 tablet (50 mg total) by mouth every 6 (six) hours as needed. 11/30/17  Cuthriell, Charline Bills, PA-C    Allergies Gabapentin and Ivp dye [iodinated diagnostic agents]  Family History  Problem Relation Age of Onset  . Cancer Father        prostate  . Cancer Brother        prostate  . Breast cancer Neg Hx     Social History Social History   Tobacco Use  . Smoking status: Current Every Day Smoker    Packs/day: 0.50    Types: Cigarettes    Last attempt to quit: 04/28/2014    Years since quitting: 6.0  .  Smokeless tobacco: Former Network engineer Use Topics  . Alcohol use: No    Alcohol/week: 0.0 standard drinks    Comment: occasional glass of wine  . Drug use: No    Review of Systems  Review of Systems  Unable to perform ROS: Dementia  Constitutional: Positive for malaise/fatigue. Negative for chills and fever.  HENT: Negative for sore throat.   Eyes: Negative for pain.  Respiratory: Positive for cough. Negative for stridor.   Cardiovascular: Positive for chest pain.  Gastrointestinal: Positive for abdominal pain, constipation and nausea. Negative for vomiting.  Genitourinary: Negative for dysuria.  Musculoskeletal: Positive for joint pain (L ankle) and myalgias ( L ankle, L lower leg).  Skin: Negative for rash.  Neurological: Positive for headaches. Negative for seizures and loss of consciousness.  Psychiatric/Behavioral: Negative for suicidal ideas.  All other systems reviewed and are negative.     ____________________________________________   PHYSICAL EXAM:  VITAL SIGNS: ED Triage Vitals  Enc Vitals Group     BP 06/01/20 1245 (!) 167/93     Pulse Rate 06/01/20 1245 (!) 114     Resp 06/01/20 1245 18     Temp 06/01/20 1245 98.2 F (36.8 C)     Temp Source 06/01/20 1245 Oral     SpO2 06/01/20 1245 96 %     Weight 06/01/20 1244 165 lb (74.8 kg)     Height 06/01/20 1244 5' 7"  (1.702 m)     Head Circumference --      Peak Flow --      Pain Score 06/01/20 1243 10     Pain Loc --      Pain Edu? --      Excl. in [place]? --    Vitals:   06/01/20 1855 06/01/20 1945  BP: (!) 146/114 (!) 146/90  Pulse: 73 93  Resp: 16 18  Temp:    SpO2: 98% 96%   Physical Exam Vitals and nursing note reviewed.  Constitutional:      General: She is not in acute distress.    Appearance: She is well-developed and well-nourished.  HENT:     Head: Normocephalic and atraumatic.     Right Ear: External ear normal.     Left Ear: External ear normal.     Nose: Nose normal.      Mouth/Throat:     Mouth: Mucous membranes are moist.  Eyes:     Conjunctiva/sclera: Conjunctivae normal.  Cardiovascular:     Rate and Rhythm: Regular rhythm. Tachycardia present.     Heart sounds: No murmur heard.   Pulmonary:     Effort: Pulmonary effort is normal. No respiratory distress.     Breath sounds: Normal breath sounds.  Abdominal:     Palpations: Abdomen is soft.     Tenderness: There is no abdominal tenderness.  Musculoskeletal:     Cervical back: Neck supple.  Right lower leg: Edema present.     Left lower leg: Edema present.  Skin:    General: Skin is warm and dry.  Neurological:     Mental Status: She is alert and oriented to person, place, and time.  Psychiatric:        Mood and Affect: Mood and affect and mood normal.     Bilateral lower extremity edema although more so on the left than the right. She also has some tenderness and erythema over the left ankle extending proximally to the mid left lower leg. Is primarily anterior and lateral. She does have full range of motion at the ankle although she has significant pain range of motion. She has full strength and range of motion at the bilateral knees hips and upper extremities. Sensation is intact light touch of all extremities. 2+ bilateral radial and DP pulses. Cranial nerves II 12 grossly intact. ____________________________________________   LABS (all labs ordered are listed, but only abnormal results are displayed)  Labs Reviewed  COMPREHENSIVE METABOLIC PANEL - Abnormal; Notable for the following components:      Result Value   Glucose, Bld 119 (*)    BUN 35 (*)    Calcium 8.5 (*)    Albumin 2.5 (*)    AST 44 (*)    Alkaline Phosphatase 167 (*)    All other components within normal limits  CBC - Abnormal; Notable for the following components:   RBC 5.13 (*)    Hemoglobin 16.0 (*)    HCT 49.1 (*)    RDW 15.8 (*)    All other components within normal limits  URINALYSIS, COMPLETE (UACMP) WITH  MICROSCOPIC - Abnormal; Notable for the following components:   Color, Urine AMBER (*)    APPearance CLOUDY (*)    Specific Gravity, Urine 1.034 (*)    Hgb urine dipstick LARGE (*)    Protein, ur >=300 (*)    Bacteria, UA MANY (*)    All other components within normal limits  BRAIN NATRIURETIC PEPTIDE - Abnormal; Notable for the following components:   B Natriuretic Peptide 137.6 (*)    All other components within normal limits  FIBRIN DERIVATIVES D-DIMER ([facility] ONLY) - Abnormal; Notable for the following components:   Fibrin derivatives D-dimer ([facility]) >7,500.00 (*)    All other components within normal limits  RESP PANEL BY RT-PCR (FLU A&B, COVID) ARPGX2  URINE CULTURE  LIPASE, BLOOD  TROPONIN I (HIGH SENSITIVITY)  TROPONIN I (HIGH SENSITIVITY)   ____________________________________________  EKG  Sinus tachycardia with ventricular rate of 112, normal axis, unremarkable intervals, nonspecific changes in anterior leads and some artifact in V5 and V1 without other clear evidence of acute ischemia or other significant underlying arrhythmia. ____________________________________________  RADIOLOGY  ED MD interpretation: Plain film of the left ankle shows no fracture or dislocation.  Official radiology report(s): CT ABDOMEN PELVIS WO CONTRAST  Result Date: 06/01/2020 CLINICAL DATA:  Cough, nausea, abdominal pain EXAM: CT CHEST, ABDOMEN AND PELVIS WITHOUT CONTRAST TECHNIQUE: Multidetector CT imaging of the chest, abdomen and pelvis was performed following the standard protocol without IV contrast. COMPARISON:  CT 10/08/2015, 12/29/2010 FINDINGS: CT CHEST FINDINGS Cardiovascular: Heart size is mildly enlarged. Trace pericardial effusion. Thoracic aorta is nonaneurysmal. Scattered atherosclerotic calcification of the aorta and coronary arteries. Main pulmonary trunk is nondilated. Mediastinum/Nodes: Surgical clips in the right breast and right axilla. No no axillary lymphadenopathy. Borderline  prominent precarinal node measuring 10 mm short axis (series 2, image 52), unchanged from 2012. No  evidence of hilar lymphadenopathy by noncontrast CT. Unchanged coarse calcification within the thyroid gland. Trachea and esophagus within normal limits. Lungs/Pleura: Bibasilar scarring or atelectasis. Lungs are otherwise clear. No pleural effusion or pneumothorax. Musculoskeletal: Right breast skin thickening and lumpectomy changes. Degenerative changes throughout the thoracic spine. No acute osseous findings. CT ABDOMEN PELVIS FINDINGS Hepatobiliary: Nodular hepatic surface contour compatible with cirrhosis. No focal liver lesion evident on noncontrast imaging. Prior cholecystectomy. No biliary dilatation. Pancreas: Unremarkable. No pancreatic ductal dilatation or surrounding inflammatory changes. Spleen: Upper limits of normal in size. Adrenals/Urinary Tract: Adrenal glands within normal limits. Bilateral renal sinus cysts. No renal stone or hydronephrosis. Urinary bladder is partially decompressed, limiting its evaluation. Stomach/Bowel: Stomach is within normal limits. Scattered colonic diverticulosis. No evidence of bowel wall thickening, distention, or inflammatory changes. Vascular/Lymphatic: Aortoiliac atherosclerosis without aneurysm. Upper abdominal varices. No abdominopelvic lymphadenopathy. Reproductive: Uterus and bilateral adnexa are unremarkable. Other: Small volume free fluid within the pelvis. Trace perihepatic ascites. No organized abdominopelvic fluid collection. No pneumoperitoneum. Unchanged peripherally calcified 1.8 x 1.7 cm structure within the anterior aspect of the upper abdomen abutting the peritoneal surface (series 2, image 37). Musculoskeletal: L3-L5 spinal fusion. Degenerative changes within the lumbar spine and bilateral hips. No acute osseous findings. IMPRESSION: 1. No acute findings within the chest, abdomen, or pelvis. 2. Cirrhosis with evidence of portal hypertension including  upper abdominal varices and small volume ascites. 3. Colonic diverticulosis without evidence of acute diverticulitis. 4. Aortic and coronary artery atherosclerosis.  (ICD10-I70.0). 5. Postsurgical changes within the right breast and axilla. Electronically Signed   By: Davina Poke D.O.   On: 06/01/2020 17:10   DG Ankle 2 Views Left  Result Date: 06/01/2020 CLINICAL DATA:  Fall 3 weeks ago.  Pain, swelling EXAM: LEFT ANKLE - 2 VIEW COMPARISON:  None. FINDINGS: Diffuse soft tissue swelling. No acute bony abnormality. Specifically, no fracture, subluxation, or dislocation. IMPRESSION: No acute bony abnormality. Electronically Signed   By: Rolm Baptise M.D.   On: 06/01/2020 13:20   CT Head Wo Contrast  Result Date: 06/01/2020 CLINICAL DATA:  Headache.  Rule out intracranial hemorrhage EXAM: CT HEAD WITHOUT CONTRAST TECHNIQUE: Contiguous axial images were obtained from the base of the skull through the vertex without intravenous contrast. COMPARISON:  CT head 07/09/2019 FINDINGS: Brain: Ventricle size normal. Patchy white matter hypodensity bilaterally, stable. Negative for acute infarct, hemorrhage, mass. Vascular: Negative for hyperdense vessel Skull: Negative Sinuses/Orbits: Paranasal sinuses clear. Bilateral cataract extraction Other: None IMPRESSION: No acute abnormality no change from the prior CT. Chronic white matter changes consistent with microvascular ischemia. Electronically Signed   By: Franchot Gallo M.D.   On: 06/01/2020 17:01   CT Chest Wo Contrast  Result Date: 06/01/2020 CLINICAL DATA:  Cough, nausea, abdominal pain EXAM: CT CHEST, ABDOMEN AND PELVIS WITHOUT CONTRAST TECHNIQUE: Multidetector CT imaging of the chest, abdomen and pelvis was performed following the standard protocol without IV contrast. COMPARISON:  CT 10/08/2015, 12/29/2010 FINDINGS: CT CHEST FINDINGS Cardiovascular: Heart size is mildly enlarged. Trace pericardial effusion. Thoracic aorta is nonaneurysmal. Scattered  atherosclerotic calcification of the aorta and coronary arteries. Main pulmonary trunk is nondilated. Mediastinum/Nodes: Surgical clips in the right breast and right axilla. No no axillary lymphadenopathy. Borderline prominent precarinal node measuring 10 mm short axis (series 2, image 52), unchanged from 2012. No evidence of hilar lymphadenopathy by noncontrast CT. Unchanged coarse calcification within the thyroid gland. Trachea and esophagus within normal limits. Lungs/Pleura: Bibasilar scarring or atelectasis. Lungs are otherwise clear. No pleural effusion  or pneumothorax. Musculoskeletal: Right breast skin thickening and lumpectomy changes. Degenerative changes throughout the thoracic spine. No acute osseous findings. CT ABDOMEN PELVIS FINDINGS Hepatobiliary: Nodular hepatic surface contour compatible with cirrhosis. No focal liver lesion evident on noncontrast imaging. Prior cholecystectomy. No biliary dilatation. Pancreas: Unremarkable. No pancreatic ductal dilatation or surrounding inflammatory changes. Spleen: Upper limits of normal in size. Adrenals/Urinary Tract: Adrenal glands within normal limits. Bilateral renal sinus cysts. No renal stone or hydronephrosis. Urinary bladder is partially decompressed, limiting its evaluation. Stomach/Bowel: Stomach is within normal limits. Scattered colonic diverticulosis. No evidence of bowel wall thickening, distention, or inflammatory changes. Vascular/Lymphatic: Aortoiliac atherosclerosis without aneurysm. Upper abdominal varices. No abdominopelvic lymphadenopathy. Reproductive: Uterus and bilateral adnexa are unremarkable. Other: Small volume free fluid within the pelvis. Trace perihepatic ascites. No organized abdominopelvic fluid collection. No pneumoperitoneum. Unchanged peripherally calcified 1.8 x 1.7 cm structure within the anterior aspect of the upper abdomen abutting the peritoneal surface (series 2, image 37). Musculoskeletal: L3-L5 spinal fusion.  Degenerative changes within the lumbar spine and bilateral hips. No acute osseous findings. IMPRESSION: 1. No acute findings within the chest, abdomen, or pelvis. 2. Cirrhosis with evidence of portal hypertension including upper abdominal varices and small volume ascites. 3. Colonic diverticulosis without evidence of acute diverticulitis. 4. Aortic and coronary artery atherosclerosis.  (ICD10-I70.0). 5. Postsurgical changes within the right breast and axilla. Electronically Signed   By: Davina Poke D.O.   On: 06/01/2020 17:10   US Venous Img Lower Unilateral Left  Result Date: 06/01/2020 CLINICAL DATA:  Lower extremity pain and edema EXAM: Left LOWER EXTREMITY VENOUS DOPPLER ULTRASOUND TECHNIQUE: Gray-scale sonography with compression, as well as color and duplex ultrasound, were performed to evaluate the deep venous system(s) from the level of the common femoral vein through the popliteal and proximal calf veins. COMPARISON:  None. FINDINGS: VENOUS Normal compressibility of the common femoral, superficial femoral, and popliteal veins, as well as the visualized calf veins. Visualized portions of profunda femoral vein and great saphenous vein unremarkable. No filling defects to suggest DVT on grayscale or color Doppler imaging. Doppler waveforms show normal direction of venous flow, normal respiratory plasticity and response to augmentation. Limited views of the contralateral common femoral vein are unremarkable. OTHER None. Limitations: none IMPRESSION: Negative. Electronically Signed   By: Donavan Foil M.D.   On: 06/01/2020 17:18    ____________________________________________   PROCEDURES  Procedure(s) performed (including Critical Care):  Procedures   ____________________________________________   INITIAL IMPRESSION / ASSESSMENT AND PLAN / ED COURSE      Patient presents with [place] to history exam for assessment of multiple symptoms that been ongoing for the past 3 weeks after she rolled  her left ankle.  On arrival patient is slightly tachycardic with a heart rate of 114 as well as hypertensive with a BP of 167 and 83 with otherwise stable vital signs on room air.  She does have some edema on the left that seems greater than the right as well as some erythema and tenderness although she is otherwise neurovascularly intact in both lower extremities.  She does not have any other neuro deficits.  With regard to patient's left lower leg pain ultrasound has no evidence of DVT and x-ray shows evidence of fracture dislocation.  Patient is neurovascularly intact.  She does have some erythema and tenderness concerning for possible developing cellulitis although he has been wearing white full range of motion of her low suspicion for significant tendon injury at this time.  With regard to  her headache unclear etiology at this time although she has no focal deficits to suggest CVA tenderness or exam findings to suggest meningitis or other deep space infection in the head or neck.  No tenderness over the temporal areas or history of jaw claudication suggestive for arteritis.  No other mass lesion seen on CT head or other clear acute process  Unclear etiology of patient's chest pain at this time.  Low suspicion for ACS given 2 nonelevated troponins obtained over 2 hours with otherwise relatively reassuring EKG.  Chest CT has no evidence of pneumothorax, focal consolidation suggestive of pneumonia, or other clear acute thoracic process.  D-dimer is significantly elevated at greater than 7500.  Given this with history of chest pain and shortness of breath as well as cough and no other clear etiology on CT without contrast will like to pretreat patient for her contrast allergy as V/Q scan is not currently available.  With right patient's abdominal pain CT has no evidence of diverticulitis, appendicitis, cholecystitis, pancreatitis, or perinephric stranding suggestive of pyelonephritis.  UA suggestive of  cystitis.  No evidence of kidney stone on CT.  CMP with mildly elevated alk phos at 167 with no other significant electrolyte or metabolic derangements.  Lipase is not consistent with acute pancreatitis.  Care patient signed over to oncoming rider at approximately 2300.  Plan is to follow-up CTA and if negative very low suspicion for immediate life-threatening intrathoracic process I believe patient will be safe for discharge patient with plan for close outpatient follow-up.  Will write antibiotics to cover for cystitis as well as possible cellulitis in the left lower extremity.    ____________________________________________   FINAL CLINICAL IMPRESSION(S) / ED DIAGNOSES  Final diagnoses:  Acute cystitis with hematuria  SOB (shortness of breath)  Chest pain, unspecified type  Nonintractable headache, unspecified chronicity pattern, unspecified headache type  Positive D dimer  Tobacco abuse  History of cancer    Medications  hydrocortisone sodium succinate (SOLU-CORTEF) injection 200 mg (has no administration in time range)  diphenhydrAMINE (BENADRYL) capsule 50 mg (has no administration in time range)    Or  diphenhydrAMINE (BENADRYL) injection 50 mg (has no administration in time range)  labetalol (NORMODYNE) injection 10 mg (10 mg Intravenous Given 06/01/20 1856)  acetaminophen (TYLENOL) tablet 1,000 mg (1,000 mg Oral Given 06/01/20 1855)  cefTRIAXone (ROCEPHIN) 1 g in sodium chloride 0.9 % 100 mL IVPB (0 g Intravenous Stopped 06/01/20 2033)     ED Discharge Orders    None       Note:  This document was prepared using Dragon voice recognition software and may include unintentional dictation errors.   Lucrezia Starch, MD 06/01/20 2232

## 2020-06-02 ENCOUNTER — Emergency Department: Payer: Medicare Other

## 2020-06-02 ENCOUNTER — Encounter: Payer: Self-pay | Admitting: Radiology

## 2020-06-02 ENCOUNTER — Other Ambulatory Visit: Payer: Self-pay

## 2020-06-02 DIAGNOSIS — N390 Urinary tract infection, site not specified: Secondary | ICD-10-CM | POA: Diagnosis not present

## 2020-06-02 DIAGNOSIS — N3001 Acute cystitis with hematuria: Secondary | ICD-10-CM | POA: Diagnosis present

## 2020-06-02 DIAGNOSIS — I10 Essential (primary) hypertension: Secondary | ICD-10-CM | POA: Diagnosis not present

## 2020-06-02 DIAGNOSIS — Z20822 Contact with and (suspected) exposure to covid-19: Secondary | ICD-10-CM | POA: Diagnosis present

## 2020-06-02 DIAGNOSIS — L03116 Cellulitis of left lower limb: Secondary | ICD-10-CM | POA: Diagnosis present

## 2020-06-02 DIAGNOSIS — Z9221 Personal history of antineoplastic chemotherapy: Secondary | ICD-10-CM | POA: Diagnosis not present

## 2020-06-02 DIAGNOSIS — I2510 Atherosclerotic heart disease of native coronary artery without angina pectoris: Secondary | ICD-10-CM | POA: Diagnosis present

## 2020-06-02 DIAGNOSIS — F17210 Nicotine dependence, cigarettes, uncomplicated: Secondary | ICD-10-CM | POA: Diagnosis present

## 2020-06-02 DIAGNOSIS — Z9049 Acquired absence of other specified parts of digestive tract: Secondary | ICD-10-CM | POA: Diagnosis not present

## 2020-06-02 DIAGNOSIS — I700 Atherosclerosis of aorta: Secondary | ICD-10-CM | POA: Diagnosis present

## 2020-06-02 DIAGNOSIS — K219 Gastro-esophageal reflux disease without esophagitis: Secondary | ICD-10-CM | POA: Diagnosis present

## 2020-06-02 DIAGNOSIS — Z923 Personal history of irradiation: Secondary | ICD-10-CM | POA: Diagnosis not present

## 2020-06-02 DIAGNOSIS — Z853 Personal history of malignant neoplasm of breast: Secondary | ICD-10-CM | POA: Diagnosis not present

## 2020-06-02 DIAGNOSIS — I2699 Other pulmonary embolism without acute cor pulmonale: Principal | ICD-10-CM

## 2020-06-02 DIAGNOSIS — K7460 Unspecified cirrhosis of liver: Secondary | ICD-10-CM | POA: Diagnosis present

## 2020-06-02 DIAGNOSIS — W1830XA Fall on same level, unspecified, initial encounter: Secondary | ICD-10-CM | POA: Diagnosis present

## 2020-06-02 DIAGNOSIS — K766 Portal hypertension: Secondary | ICD-10-CM | POA: Diagnosis present

## 2020-06-02 DIAGNOSIS — Z886 Allergy status to analgesic agent status: Secondary | ICD-10-CM | POA: Diagnosis not present

## 2020-06-02 DIAGNOSIS — I4821 Permanent atrial fibrillation: Secondary | ICD-10-CM | POA: Diagnosis present

## 2020-06-02 DIAGNOSIS — R7881 Bacteremia: Secondary | ICD-10-CM | POA: Diagnosis not present

## 2020-06-02 DIAGNOSIS — R188 Other ascites: Secondary | ICD-10-CM | POA: Diagnosis present

## 2020-06-02 DIAGNOSIS — J449 Chronic obstructive pulmonary disease, unspecified: Secondary | ICD-10-CM | POA: Diagnosis present

## 2020-06-02 DIAGNOSIS — M1990 Unspecified osteoarthritis, unspecified site: Secondary | ICD-10-CM | POA: Diagnosis present

## 2020-06-02 DIAGNOSIS — K5730 Diverticulosis of large intestine without perforation or abscess without bleeding: Secondary | ICD-10-CM | POA: Diagnosis present

## 2020-06-02 DIAGNOSIS — Z85828 Personal history of other malignant neoplasm of skin: Secondary | ICD-10-CM | POA: Diagnosis not present

## 2020-06-02 DIAGNOSIS — R519 Headache, unspecified: Secondary | ICD-10-CM | POA: Diagnosis present

## 2020-06-02 DIAGNOSIS — R5381 Other malaise: Secondary | ICD-10-CM | POA: Diagnosis present

## 2020-06-02 LAB — PROTIME-INR
INR: 1.3 — ABNORMAL HIGH (ref 0.8–1.2)
Prothrombin Time: 16.1 seconds — ABNORMAL HIGH (ref 11.4–15.2)

## 2020-06-02 LAB — HEPARIN LEVEL (UNFRACTIONATED)
Heparin Unfractionated: 1.05 IU/mL — ABNORMAL HIGH (ref 0.30–0.70)
Heparin Unfractionated: 0.43 IU/mL (ref 0.30–0.70)

## 2020-06-02 LAB — APTT: aPTT: >160 seconds (ref 24–36)

## 2020-06-02 MED ORDER — VITAMIN D3 25 MCG (1000 UNIT) PO TABS
1000.0000 [IU] | ORAL_TABLET | Freq: Every day | ORAL | Status: DC
  Administered 2020-06-02 – 2020-06-04 (×4): 1000 [IU] via ORAL
  Filled 2020-06-02 (×5): qty 1

## 2020-06-02 MED ORDER — ONDANSETRON HCL 4 MG/2ML IJ SOLN: 4.0000 mg | Freq: Four times a day (QID) | INTRAMUSCULAR | Status: DC | PRN

## 2020-06-02 MED ORDER — HEPARIN (PORCINE) 25000 UT/250ML-% IV SOLN
950.0000 [IU]/h | INTRAVENOUS | Status: DC
  Administered 2020-06-02: 1300 [IU]/h via INTRAVENOUS
  Filled 2020-06-02 (×2): qty 250

## 2020-06-02 MED ORDER — ONDANSETRON HCL 4 MG PO TABS: 4.0000 mg | ORAL_TABLET | Freq: Four times a day (QID) | ORAL | Status: DC | PRN

## 2020-06-02 MED ORDER — TRAZODONE HCL 50 MG PO TABS: 25.0000 mg | ORAL_TABLET | Freq: Every evening | ORAL | Status: DC | PRN

## 2020-06-02 MED ORDER — SODIUM CHLORIDE 0.9 % IV SOLN
1.0000 g | INTRAVENOUS | Status: DC
  Administered 2020-06-02: 1 g via INTRAVENOUS
  Filled 2020-06-02: qty 10

## 2020-06-02 MED ORDER — ACETAMINOPHEN 325 MG PO TABS
650.0000 mg | ORAL_TABLET | Freq: Four times a day (QID) | ORAL | Status: DC | PRN
  Administered 2020-06-03: 650 mg via ORAL
  Filled 2020-06-02: qty 2

## 2020-06-02 MED ORDER — LEVALBUTEROL HCL 0.63 MG/3ML IN NEBU: 0.6300 mg | INHALATION_SOLUTION | Freq: Four times a day (QID) | RESPIRATORY_TRACT | Status: DC | PRN

## 2020-06-02 MED ORDER — METHYLPREDNISOLONE SODIUM SUCC 40 MG IJ SOLR
40.0000 mg | Freq: Two times a day (BID) | INTRAMUSCULAR | Status: DC
  Administered 2020-06-02 – 2020-06-03 (×3): 40 mg via INTRAVENOUS
  Filled 2020-06-02 (×3): qty 1

## 2020-06-02 MED ORDER — SODIUM CHLORIDE 0.9 % IV SOLN: INTRAVENOUS | Status: DC

## 2020-06-02 MED ORDER — HEPARIN SODIUM (PORCINE) 5000 UNIT/ML IJ SOLN
4000.0000 [IU] | Freq: Once | INTRAMUSCULAR | Status: AC
  Administered 2020-06-02: 4000 [IU] via INTRAVENOUS
  Filled 2020-06-02: qty 1

## 2020-06-02 MED ORDER — ADULT MULTIVITAMIN W/MINERALS CH
1.0000 | ORAL_TABLET | Freq: Every day | ORAL | Status: DC
  Administered 2020-06-02 – 2020-06-04 (×3): 1 via ORAL
  Filled 2020-06-02 (×3): qty 1

## 2020-06-02 MED ORDER — METOPROLOL TARTRATE 25 MG PO TABS
12.5000 mg | ORAL_TABLET | Freq: Two times a day (BID) | ORAL | Status: DC
  Administered 2020-06-02 – 2020-06-04 (×4): 12.5 mg via ORAL
  Filled 2020-06-02 (×5): qty 1

## 2020-06-02 MED ORDER — IOHEXOL 350 MG/ML SOLN
75.0000 mL | Freq: Once | INTRAVENOUS | Status: AC | PRN
  Administered 2020-06-02: 75 mL via INTRAVENOUS

## 2020-06-02 MED ORDER — ACETAMINOPHEN 650 MG RE SUPP: 650.0000 mg | Freq: Four times a day (QID) | RECTAL | Status: DC | PRN

## 2020-06-02 MED ORDER — CALCIUM CARBONATE 1250 (500 CA) MG PO TABS
1250.0000 mg | ORAL_TABLET | Freq: Every day | ORAL | Status: DC
  Administered 2020-06-02 – 2020-06-04 (×2): 1250 mg via ORAL
  Filled 2020-06-02 (×3): qty 1

## 2020-06-02 NOTE — ED Notes (Signed)
Pt assisted to toilet to use restroom. Pt provided supper tray. Sitting on side of bed eating at this time.

## 2020-06-02 NOTE — ED Notes (Signed)
Pt sat up in bed, breakfast tray provided to pt.

## 2020-06-02 NOTE — ED Notes (Signed)
Pt ambulatory to toilet with steady gait to urinate and have BM

## 2020-06-02 NOTE — ED Notes (Signed)
Date and time results received: 06/02/20 0608  Test: PTT Critical Value: >160  Name of Provider Notified: Dr. Sidney Ace  Orders Received? Or Actions Taken?: Awaiting response

## 2020-06-02 NOTE — Progress Notes (Signed)
PT Cancellation Note  Patient Details Name: [name] MRN: [id number] DOB: 19-Dec-1935   Cancelled Treatment:    Reason Eval/Treat Not Completed: Patient not medically ready. Orders received, chart reviewed - pt noted to have new PE, started on anticoagulation 06/03/19 at 0358 AM. Per therapy guidelines will hold services until 48hrs after anticoagulation was initiated.  Lavone Nian, PT, DPT 06/02/20, 10:17 AM   [name] 06/02/2020, 10:17 AM

## 2020-06-02 NOTE — ED Notes (Signed)
Pt ambulatory to toilet with steady gait.  

## 2020-06-02 NOTE — Progress Notes (Signed)
OT Cancellation Note  Patient Details Name: IVIANNA NOTCH MRN: [id number] DOB: [birth year]   Cancelled Treatment:    Reason Eval/Treat Not Completed: Patient not medically ready. Chart reviewed - pt noted to have new PE, started on anticoagulation 06/03/19 at 0358 AM. Per therapy guidelines will hold services until 48hrs after anticoagulation was initiated.   Dessie Coma, M.S. OTR/L  06/02/20, 10:10 AM  ascom [phone]

## 2020-06-02 NOTE — H&P (Signed)
PATIENT NAME: [name]    MR#:  [id number]  DATE OF BIRTH:  [birth year]  DATE OF ADMISSION:  06/01/2020  PRIMARY CARE PHYSICIAN: Dayton Martes Victoriano Lain, NP   REQUESTING/REFERRING PHYSICIAN: Vladimir Crofts, MD  CHIEF COMPLAINT:   Chief Complaint  Patient presents with  . Nausea  . Abdominal Pain    HISTORY OF PRESENT ILLNESS:  [name]  is a 85 y.o. Caucasian female with a known history of hypertension, breast cancer status post right lumpectomy and GERD, who presented to the emergency room with acute onset of dyspnea, dry cough and right parasternal chest pain which has been intermittent since December 1 and significantly worsening over the last few days.  She been having bilateral lower extremity pain more on the left leg.  She admitted to nausea without vomiting.  No fever or chills.  She denies any significant dysuria, oliguria or hematuria urgency or frequency or flank pain.  Upon presentation to the ER, blood pressure was 167/93 with a heart rate of 114 with otherwise normal vital signs.  Labs revealed a BUN of 35 and alkaline phosphatase of 167 with AST of 44 with otherwise normal CMP.  BNP was 137.6 and high-sensitivity troponin I was 6.  CBC showed hemoconcentration.  Influenza antigens and COVID-19 PCR came back negative.  UA was positive for UTI and showed more than 300 protein.  Two-view chest x-ray showed no acute cardiopulmonary disease and 2 view left ankle x-ray was negative.  Abdominal and pelvic CT scan revealed cirrhosis with evidence of portal hypertension including upper abdominal varices and small volume ascites, colonic diverticulosis without diverticulitis, aortic and coronary artery atherosclerosis and postsurgical changes within the right breast and axilla without acute findings.  Chest CTA revealed small segmental pulmonary emboli in the right and left lower lobes, hepatic cirrhosis and aortic atherosclerosis.  The patient was given 1 g of  p.o. Tylenol, IV heparin bolus and drip and a gram of IV ceftriaxone for suspected left lower extremity cellulitis.Marland Kitchen  She was given 20 mg of IV Solu-Cortef 50 mg of IV and 50 mg p.o. Benadryl given history of IV dye allergy before her chest CTA.  She will be admitted to a progressive unit bed for further evaluation and management.  PAST MEDICAL HISTORY:   Past Medical History:  Diagnosis Date  . Arthritis    lower back, knees  . Breast cancer ([place]) 2011   RT LUMPECTOMY  . Chemotherapy follow-up examination 2011   RT BREAST CANCER  . Cirrhosis ([place])   . Personal history of chemotherapy   . Personal history of radiation therapy   . Radiation 2011   RT BREAST CANCER  . Skin cancer   . Wears dentures    full upper, partial lower    PAST SURGICAL HISTORY:   Past Surgical History:  Procedure Laterality Date  . BREAST BIOPSY Right 2014   RT CORE W/CLIP - NEG  . BREAST EXCISIONAL BIOPSY Right 2011   breast ca  . CATARACT EXTRACTION W/PHACO Right 04/15/2016   Procedure: CATARACT EXTRACTION PHACO AND INTRAOCULAR LENS PLACEMENT (IOC);  Surgeon: Leandrew Koyanagi, MD;  Location: [facility];  Service: Ophthalmology;  Laterality: Right;  . CATARACT EXTRACTION W/PHACO Left 07/01/2016   Procedure: CATARACT EXTRACTION PHACO AND INTRAOCULAR LENS PLACEMENT (IOC);  Surgeon: Leandrew Koyanagi, MD;  Location: [facility];  Service: Ophthalmology;  Laterality: Left;  Left eye  . CHOLECYSTECTOMY      SOCIAL HISTORY:  Social History   Tobacco Use  . Smoking status: Current Every Day Smoker    Packs/day: 0.50    Types: Cigarettes    Last attempt to quit: 04/28/2014    Years since quitting: 6.1  . Smokeless tobacco: Former Network engineer Use Topics  . Alcohol use: No    Alcohol/week: 0.0 standard drinks    Comment: occasional glass of wine    FAMILY HISTORY:   Family History  Problem Relation Age of Onset  . Cancer Father        prostate  . Cancer Brother         prostate  . Breast cancer Neg Hx     DRUG ALLERGIES:   Allergies  Allergen Reactions  . Gabapentin Itching  . Ivp Dye [Iodinated Diagnostic Agents] Itching    REVIEW OF SYSTEMS:   ROS As per history of present illness. All pertinent systems were reviewed above. Constitutional, HEENT, cardiovascular, respiratory, GI, GU, musculoskeletal, neuro, psychiatric, endocrine, integumentary and hematologic systems were reviewed and are otherwise negative/unremarkable except for positive findings mentioned above in the HPI.   MEDICATIONS AT HOME:   Prior to Admission medications   Medication Sig Start Date End Date Taking? Authorizing Provider  calcium carbonate (OS-CAL) 1250 (500 Ca) MG chewable tablet Chew 1 tablet by mouth daily.   Yes [provider]  Cholecalciferol 25 MCG (1000 UT) tablet Take 1 tablet by mouth daily.   Yes [provider]  meloxicam (MOBIC) 15 MG tablet Take 1 tablet by mouth daily as needed. 02/07/15  Yes [provider]  Multiple Vitamins-Minerals (CENTRUM SILVER 50+WOMEN) TABS Take 1 tablet by mouth daily.   Yes [provider]  sulfamethoxazole-trimethoprim (BACTRIM DS) 800-160 MG tablet Take 1 tablet by mouth 2 (two) times daily for 7 days. 06/01/20 06/08/20 Yes Lucrezia Starch, MD  methocarbamol (ROBAXIN) 500 MG tablet Take 1 tablet (500 mg total) by mouth at bedtime as needed for muscle spasms. Patient not taking: No sig reported 11/30/17   Cuthriell, Charline Bills, PA-C  predniSONE (DELTASONE) 10 MG tablet Take 1 tablet (10 mg total) by mouth daily. Patient not taking: No sig reported 11/30/17   Cuthriell, Charline Bills, PA-C  Pseudoephedrine-Guaifenesin [facility] D PO) Take by mouth as needed. Patient not taking: No sig reported    [provider]  traMADol (ULTRAM) 50 MG tablet Take 1 tablet (50 mg total) by mouth every 6 (six) hours as needed. Patient not taking: No sig reported 11/30/17   Cuthriell, Charline Bills, PA-C      VITAL  SIGNS:  Blood pressure (!) 163/95, pulse 93, temperature 98.2 F (36.8 C), temperature source Oral, resp. rate 18, height 5\' 7"  (1.702 m), weight 74.8 kg, SpO2 98 %.  PHYSICAL EXAMINATION:  Physical Exam  GENERAL:  85 y.o.-year-old Caucasian female patient lying in the bed with no acute distress.  EYES: Pupils equal, round, reactive to light and accommodation. No scleral icterus. Extraocular muscles intact.  HEENT: Head atraumatic, normocephalic. Oropharynx and nasopharynx clear.  NECK:  Supple, no jugular venous distention. No thyroid enlargement, no tenderness.  LUNGS: Normal breath sounds bilaterally, no wheezing, rales,rhonchi or crepitation. No use of accessory muscles of respiration.  CARDIOVASCULAR: Regular rate and rhythm, S1, S2 normal. No murmurs, rubs, or gallops.  ABDOMEN: Soft, nondistended, nontender. Bowel sounds present. No organomegaly or mass.  EXTREMITIES: Bilateral lower extremity edema, more on the left than the right with left lower leg tenderness and post dermatitic hyperpigmentation.  EUROLOGIC: Cranial  nerves II through XII are intact. Muscle strength 5/5 in all extremities. Sensation intact. Gait not checked.  PSYCHIATRIC: The patient is alert and oriented x 3.  Normal affect and good eye contact. SKIN: No obvious rash, lesion, or ulcer.   LABORATORY PANEL:   CBC Recent Labs  Lab 06/01/20 1251  WBC 10.1  HGB 16.0*  HCT 49.1*  PLT 269   ------------------------------------------------------------------------------------------------------------------  Chemistries  Recent Labs  Lab 06/01/20 1251  NA 138  K 4.4  CL 103  CO2 27  GLUCOSE 119*  BUN 35*  CREATININE 0.83  CALCIUM 8.5*  AST 44*  ALT 32  ALKPHOS 167*  BILITOT 1.2   ------------------------------------------------------------------------------------------------------------------  Cardiac Enzymes No results for input(s): TROPONINI in the last 168  hours. ------------------------------------------------------------------------------------------------------------------  RADIOLOGY:  CT ABDOMEN PELVIS WO CONTRAST  Result Date: 06/01/2020 CLINICAL DATA:  Cough, nausea, abdominal pain EXAM: CT CHEST, ABDOMEN AND PELVIS WITHOUT CONTRAST TECHNIQUE: Multidetector CT imaging of the chest, abdomen and pelvis was performed following the standard protocol without IV contrast. COMPARISON:  CT 10/08/2015, 12/29/2010 FINDINGS: CT CHEST FINDINGS Cardiovascular: Heart size is mildly enlarged. Trace pericardial effusion. Thoracic aorta is nonaneurysmal. Scattered atherosclerotic calcification of the aorta and coronary arteries. Main pulmonary trunk is nondilated. Mediastinum/Nodes: Surgical clips in the right breast and right axilla. No no axillary lymphadenopathy. Borderline prominent precarinal node measuring 10 mm short axis (series 2, image 52), unchanged from 2012. No evidence of hilar lymphadenopathy by noncontrast CT. Unchanged coarse calcification within the thyroid gland. Trachea and esophagus within normal limits. Lungs/Pleura: Bibasilar scarring or atelectasis. Lungs are otherwise clear. No pleural effusion or pneumothorax. Musculoskeletal: Right breast skin thickening and lumpectomy changes. Degenerative changes throughout the thoracic spine. No acute osseous findings. CT ABDOMEN PELVIS FINDINGS Hepatobiliary: Nodular hepatic surface contour compatible with cirrhosis. No focal liver lesion evident on noncontrast imaging. Prior cholecystectomy. No biliary dilatation. Pancreas: Unremarkable. No pancreatic ductal dilatation or surrounding inflammatory changes. Spleen: Upper limits of normal in size. Adrenals/Urinary Tract: Adrenal glands within normal limits. Bilateral renal sinus cysts. No renal stone or hydronephrosis. Urinary bladder is partially decompressed, limiting its evaluation. Stomach/Bowel: Stomach is within normal limits. Scattered colonic  diverticulosis. No evidence of bowel wall thickening, distention, or inflammatory changes. Vascular/Lymphatic: Aortoiliac atherosclerosis without aneurysm. Upper abdominal varices. No abdominopelvic lymphadenopathy. Reproductive: Uterus and bilateral adnexa are unremarkable. Other: Small volume free fluid within the pelvis. Trace perihepatic ascites. No organized abdominopelvic fluid collection. No pneumoperitoneum. Unchanged peripherally calcified 1.8 x 1.7 cm structure within the anterior aspect of the upper abdomen abutting the peritoneal surface (series 2, image 37). Musculoskeletal: L3-L5 spinal fusion. Degenerative changes within the lumbar spine and bilateral hips. No acute osseous findings. IMPRESSION: 1. No acute findings within the chest, abdomen, or pelvis. 2. Cirrhosis with evidence of portal hypertension including upper abdominal varices and small volume ascites. 3. Colonic diverticulosis without evidence of acute diverticulitis. 4. Aortic and coronary artery atherosclerosis.  (ICD10-I70.0). 5. Postsurgical changes within the right breast and axilla. Electronically Signed   By: Davina Poke D.O.   On: 06/01/2020 17:10   DG Ankle 2 Views Left  Result Date: 06/01/2020 CLINICAL DATA:  Fall 3 weeks ago.  Pain, swelling EXAM: LEFT ANKLE - 2 VIEW COMPARISON:  None. FINDINGS: Diffuse soft tissue swelling. No acute bony abnormality. Specifically, no fracture, subluxation, or dislocation. IMPRESSION: No acute bony abnormality. Electronically Signed   By: Rolm Baptise M.D.   On: 06/01/2020 13:20   CT Head Wo Contrast  Result Date: 06/01/2020 CLINICAL  DATA:  Headache.  Rule out intracranial hemorrhage EXAM: CT HEAD WITHOUT CONTRAST TECHNIQUE: Contiguous axial images were obtained from the base of the skull through the vertex without intravenous contrast. COMPARISON:  CT head 07/09/2019 FINDINGS: Brain: Ventricle size normal. Patchy white matter hypodensity bilaterally, stable. Negative for acute infarct,  hemorrhage, mass. Vascular: Negative for hyperdense vessel Skull: Negative Sinuses/Orbits: Paranasal sinuses clear. Bilateral cataract extraction Other: None IMPRESSION: No acute abnormality no change from the prior CT. Chronic white matter changes consistent with microvascular ischemia. Electronically Signed   By: Franchot Gallo M.D.   On: 06/01/2020 17:01   CT Chest Wo Contrast  Result Date: 06/01/2020 CLINICAL DATA:  Cough, nausea, abdominal pain EXAM: CT CHEST, ABDOMEN AND PELVIS WITHOUT CONTRAST TECHNIQUE: Multidetector CT imaging of the chest, abdomen and pelvis was performed following the standard protocol without IV contrast. COMPARISON:  CT 10/08/2015, 12/29/2010 FINDINGS: CT CHEST FINDINGS Cardiovascular: Heart size is mildly enlarged. Trace pericardial effusion. Thoracic aorta is nonaneurysmal. Scattered atherosclerotic calcification of the aorta and coronary arteries. Main pulmonary trunk is nondilated. Mediastinum/Nodes: Surgical clips in the right breast and right axilla. No no axillary lymphadenopathy. Borderline prominent precarinal node measuring 10 mm short axis (series 2, image 52), unchanged from 2012. No evidence of hilar lymphadenopathy by noncontrast CT. Unchanged coarse calcification within the thyroid gland. Trachea and esophagus within normal limits. Lungs/Pleura: Bibasilar scarring or atelectasis. Lungs are otherwise clear. No pleural effusion or pneumothorax. Musculoskeletal: Right breast skin thickening and lumpectomy changes. Degenerative changes throughout the thoracic spine. No acute osseous findings. CT ABDOMEN PELVIS FINDINGS Hepatobiliary: Nodular hepatic surface contour compatible with cirrhosis. No focal liver lesion evident on noncontrast imaging. Prior cholecystectomy. No biliary dilatation. Pancreas: Unremarkable. No pancreatic ductal dilatation or surrounding inflammatory changes. Spleen: Upper limits of normal in size. Adrenals/Urinary Tract: Adrenal glands within normal  limits. Bilateral renal sinus cysts. No renal stone or hydronephrosis. Urinary bladder is partially decompressed, limiting its evaluation. Stomach/Bowel: Stomach is within normal limits. Scattered colonic diverticulosis. No evidence of bowel wall thickening, distention, or inflammatory changes. Vascular/Lymphatic: Aortoiliac atherosclerosis without aneurysm. Upper abdominal varices. No abdominopelvic lymphadenopathy. Reproductive: Uterus and bilateral adnexa are unremarkable. Other: Small volume free fluid within the pelvis. Trace perihepatic ascites. No organized abdominopelvic fluid collection. No pneumoperitoneum. Unchanged peripherally calcified 1.8 x 1.7 cm structure within the anterior aspect of the upper abdomen abutting the peritoneal surface (series 2, image 37). Musculoskeletal: L3-L5 spinal fusion. Degenerative changes within the lumbar spine and bilateral hips. No acute osseous findings. IMPRESSION: 1. No acute findings within the chest, abdomen, or pelvis. 2. Cirrhosis with evidence of portal hypertension including upper abdominal varices and small volume ascites. 3. Colonic diverticulosis without evidence of acute diverticulitis. 4. Aortic and coronary artery atherosclerosis.  (ICD10-I70.0). 5. Postsurgical changes within the right breast and axilla. Electronically Signed   By: Davina Poke D.O.   On: 06/01/2020 17:10   CT Angio Chest PE W and/or Wo Contrast  Result Date: 06/02/2020 CLINICAL DATA:  Chest pain and tachycardia EXAM: CT ANGIOGRAPHY CHEST WITH CONTRAST TECHNIQUE: Multidetector CT imaging of the chest was performed using the standard protocol during bolus administration of intravenous contrast. Multiplanar CT image reconstructions and MIPs were obtained to evaluate the vascular anatomy. CONTRAST:  48mL OMNIPAQUE IOHEXOL 350 MG/ML SOLN COMPARISON:  06/01/2020 chest CT FINDINGS: Cardiovascular: Contrast injection is sufficient to demonstrate satisfactory opacification of the pulmonary  arteries to the segmental level. There are small segmental emboli in the right lower lobe (5:194, 214) and within a segmental  branch of the left lower lobe (10:75). No evidence of right heart strain. The size of the main pulmonary artery is normal. Heart size is normal, with no pericardial effusion. The course and caliber of the aorta are normal. There is aortic atherosclerotic calcification. Opacification decreased due to pulmonary arterial phase contrast bolus timing. Mediastinum/Nodes: No mediastinal, hilar or axillary lymphadenopathy. Normal visualized thyroid. Thoracic esophageal course is normal. Lungs/Pleura: Airways are patent. No pleural effusion, lobar consolidation, pneumothorax or pulmonary infarction. Upper Abdomen: Contrast bolus timing is not optimized for evaluation of the abdominal organs. Hepatic cirrhosis. Musculoskeletal: No chest wall abnormality. No bony spinal canal stenosis. Review of the MIP images confirms the above findings. IMPRESSION: 1. Small segmental pulmonary emboli in the right and left lower lobes. 2. Hepatic cirrhosis. Aortic Atherosclerosis (ICD10-I70.0). Critical Value/emergent results were called by telephone at the time of interpretation on 06/02/2020 at 3:37 am to provider [facility] , who verbally acknowledged these results. Electronically Signed   By: Ulyses Jarred M.D.   On: 06/02/2020 03:38   US Venous Img Lower Unilateral Left  Result Date: 06/01/2020 CLINICAL DATA:  Lower extremity pain and edema EXAM: Left LOWER EXTREMITY VENOUS DOPPLER ULTRASOUND TECHNIQUE: Gray-scale sonography with compression, as well as color and duplex ultrasound, were performed to evaluate the deep venous system(s) from the level of the common femoral vein through the popliteal and proximal calf veins. COMPARISON:  None. FINDINGS: VENOUS Normal compressibility of the common femoral, superficial femoral, and popliteal veins, as well as the visualized calf veins. Visualized portions of  profunda femoral vein and great saphenous vein unremarkable. No filling defects to suggest DVT on grayscale or color Doppler imaging. Doppler waveforms show normal direction of venous flow, normal respiratory plasticity and response to augmentation. Limited views of the contralateral common femoral vein are unremarkable. OTHER None. Limitations: none IMPRESSION: Negative. Electronically Signed   By: Donavan Foil M.D.   On: 06/01/2020 17:18      IMPRESSION AND PLAN:   1.  Bilateral acute pulmonary embolism. -The patient will be admitted to a progressive unit bed. -Continue IV heparin. -Her left lower extremity venous Dopplers was negative for DVT.  2.  Possible early left lower extremity cellulitis. -We will continue her on IV Rocephin.  3.  UTI. -This should be covered with IV Rocephin. -We will follow urine culture and sensitivity.  4.  Essential hypertension. -We will continue her beta-blocker therapy.  5.  GERD. -We will continue PPI therapy    All the records are reviewed and case discussed with ED provider. The plan of care was discussed in details with the patient (and family). I answered all questions. The patient agreed to proceed with the above mentioned plan. Further management will depend upon hospital course.   CODE STATUS: Full code  Status is: Inpatient  Remains inpatient appropriate because:Ongoing active pain requiring inpatient pain management, Ongoing diagnostic testing needed not appropriate for outpatient work up, Unsafe d/c plan, IV treatments appropriate due to intensity of illness or inability to take PO and Inpatient level of care appropriate due to severity of illness   Dispo: The patient is from: Home              Anticipated d/c is to: Home              Anticipated d/c date is: 3 days              Patient currently is not medically stable to d/c.   TOTAL TIME  TAKING CARE OF THIS PATIENT: 55 minutes.    Christel Mormon M.D on 06/02/2020 at 5:38  AM  [facility]   From 7 PM-7 AM, contact night-coverage [email or web address]  CC: Primary care physician; Gauger, Victoriano Lain, NP

## 2020-06-02 NOTE — Progress Notes (Signed)
[facility] for Heparin Infusion Indication: pulmonary embolus  Allergies  Allergen Reactions  . Gabapentin Itching  . Ivp Dye [Iodinated Diagnostic Agents] Itching    Patient Measurements: Height: 5\' 7"  (170.2 cm) Weight: 74.8 kg (165 lb) IBW/kg (Calculated) : 61.6 Heparin Dosing Weight: 74.8 kg  Vital Signs: BP: 117/63 (01/02 2142) Pulse Rate: 80 (01/02 2142)  Labs: Recent Labs    06/01/20 1251 06/01/20 1624 06/02/20 0444 06/02/20 1227 06/02/20 2203  HGB 16.0*  --   --   --   --   HCT 49.1*  --   --   --   --   PLT 269  --   --   --   --   APTT  --   --  >160*  --   --   LABPROT  --   --  16.1*  --   --   INR  --   --  1.3*  --   --   HEPARINUNFRC  --   --   --  1.05* 0.43  CREATININE 0.83  --   --   --   --   TROPONINIHS 6 5  --   --   --     Estimated Creatinine Clearance: 55.2 mL/min (by C-G formula based on SCr of 0.83 mg/dL).   Medical History: Past Medical History:  Diagnosis Date  . Arthritis    lower back, knees  . Breast cancer ([place]) 2011   RT LUMPECTOMY  . Chemotherapy follow-up examination 2011   RT BREAST CANCER  . Cirrhosis ([place])   . Personal history of chemotherapy   . Personal history of radiation therapy   . Radiation 2011   RT BREAST CANCER  . Skin cancer   . Wears dentures    full upper, partial lower    Assessment: 85 yo female received CTA chest to assess for acute PE in the setting of chest pain shortness of breath and positive D-dimer.  CT chest reviewed with bilateral segmental PEs.  Patient tachycardic on arrival, and further being treated for acute cystitis and possible leg cellulitis.  No pta anticoagulation of record.   Pt was given 4000 units heparin bolus, followed by 1300 units/hr (of note - bolus administered prior to baseline aPTT being drawn)  01/02 @ 1227 HL 1.05 - supratherapeutic 01/02 @ 2203 HL 0.43 - therapeutic   Goal of Therapy:  Heparin level 0.3-0.7 units/ml Monitor  platelets by anticoagulation protocol: Yes   Plan:  Will continue heparin rate @ 1100 units/hr  Re-check anti-Xa level in 8 hours to confirm, then daily while on heparin Continue to monitor H&H and platelets  Renda Rolls, PharmD, [facility] 06/02/2020 10:46 PM

## 2020-06-02 NOTE — ED Provider Notes (Signed)
  Patient received in signout from Dr. Tamala Julian pending premedication for CTA chest to assess for acute PE in the setting of chest pain shortness of breath and positive D-dimer.  CT chest reviewed with bilateral segmental PEs.  Patient tachycardic on arrival, and further being treated for acute cystitis and possible leg cellulitis.  I discussed acute PE with the patient and her husband.  We discussed initiation of anticoagulation.  We discussed medical observation admission due to multiple coexisting pathologies and their discomfort going home, which I think is reasonable.  We will discussed the case with hospitalist.   Vladimir Crofts, MD 06/02/20 [phone]

## 2020-06-02 NOTE — Progress Notes (Signed)
Brief note: Patient is 85 year old female with history of hypertension, breast cancer status post right lumpectomy currently in remission,GERD, atrial fibrillation who presents to the emergency department with complaints of dyspnea, dry cough, right-sided chest pain, bilateral lower extremity edema and pain.  On presentation she was hypertensive, in A. fib.  UA was suggestive of UTI.  Chest x-ray did not show any pneumonia.  Abdomen/pelvis CT showed cirrhosis with evidence of portal hypertension.  Chest CTA showed small bilateral segmental pulmonary emboli.  Patient was admitted for the management of acute PE.  Started on heparin drip. Patient seen and examined at bedside this morning.  She was hemodynamically stable.  She was on room air.  EKG showed irregular heart rhythm suggesting  A. fib.  She had bilateral lower extremity edema.  Venous Doppler test done on presentation showed negative DVT in the left lower extremity.  She was discharged on ceftriaxone for possible cellulitis of the left lower extremity. she was found to be wheezing during my evaluation today but was on room air. We will continue heparin drip for today.  Will request a PT/OT evaluation.  We have started on prednisone for wheezing.  We will continue current management .  Apparently she is not on any anticoagulation for A. fib.  She will be started on Eliquis.

## 2020-06-02 NOTE — ED Notes (Signed)
MD at bedside. 

## 2020-06-02 NOTE — ED Notes (Signed)
Pharmacy contacted requesting verification of medications for RN to administer.  

## 2020-06-02 NOTE — ED Notes (Signed)
Heparin paused for an hour per pharmacist and then rate will resume at 53ml/hr instead of 93ml/hr.

## 2020-06-02 NOTE — Progress Notes (Addendum)
[facility] for Heparin Infusion Indication: pulmonary embolus  Allergies  Allergen Reactions  . Gabapentin Itching  . Ivp Dye [Iodinated Diagnostic Agents] Itching    Patient Measurements: Height: 5\' 7"  (170.2 cm) Weight: 74.8 kg (165 lb) IBW/kg (Calculated) : 61.6 Heparin Dosing Weight: 74.8 kg  Vital Signs: BP: 154/111 (01/02 1200) Pulse Rate: 86 (01/02 1200)  Labs: Recent Labs    06/01/20 1251 06/01/20 1624 06/02/20 0444 06/02/20 1227  HGB 16.0*  --   --   --   HCT 49.1*  --   --   --   PLT 269  --   --   --   APTT  --   --  >160*  --   LABPROT  --   --  16.1*  --   INR  --   --  1.3*  --   HEPARINUNFRC  --   --   --  1.05*  CREATININE 0.83  --   --   --   TROPONINIHS 6 5  --   --     Estimated Creatinine Clearance: 55.2 mL/min (by C-G formula based on SCr of 0.83 mg/dL).   Medical History: Past Medical History:  Diagnosis Date  . Arthritis    lower back, knees  . Breast cancer ([place]) 2011   RT LUMPECTOMY  . Chemotherapy follow-up examination 2011   RT BREAST CANCER  . Cirrhosis ([place])   . Personal history of chemotherapy   . Personal history of radiation therapy   . Radiation 2011   RT BREAST CANCER  . Skin cancer   . Wears dentures    full upper, partial lower    Assessment: 85 yo female received CTA chest to assess for acute PE in the setting of chest pain shortness of breath and positive D-dimer.  CT chest reviewed with bilateral segmental PEs.  Patient tachycardic on arrival, and further being treated for acute cystitis and possible leg cellulitis.  No pta anticoagulation of record.   Pt was given 4000 units heparin bolus, followed by 1300 units/hr (of note - bolus administered prior to baseline aPTT being drawn)  01/02 @ 1227 HL 1.05 - supratherapeutic  Goal of Therapy:  Heparin level 0.3-0.7 units/ml Monitor platelets by anticoagulation protocol: Yes   Plan:  Conferred with nursing, will hold heparin  infusion for 1 hour, then resume @ 1100 units/hr  Re-check anti-Xa level in 8 hours and daily while on heparin Continue to monitor H&H and platelets  Lu Duffel, PharmD, BCPS Clinical Pharmacist 06/02/2020 1:00 PM

## 2020-06-02 NOTE — Progress Notes (Signed)
[facility] for Heparin Infusion Indication: pulmonary embolus  Allergies  Allergen Reactions  . Gabapentin Itching  . Ivp Dye [Iodinated Diagnostic Agents] Itching    Patient Measurements: Height: 5\' 7"  (170.2 cm) Weight: 74.8 kg (165 lb) IBW/kg (Calculated) : 61.6 Heparin Dosing Weight: 74.8 kg  Vital Signs: BP: 163/95 (01/02 0401) Pulse Rate: 93 (01/02 0401)  Labs: Recent Labs    06/01/20 1251 06/01/20 1624  HGB 16.0*  --   HCT 49.1*  --   PLT 269  --   CREATININE 0.83  --   TROPONINIHS 6 5    Estimated Creatinine Clearance: 55.2 mL/min (by C-G formula based on SCr of 0.83 mg/dL).   Medical History: Past Medical History:  Diagnosis Date  . Arthritis    lower back, knees  . Breast cancer ([place]) 2011   RT LUMPECTOMY  . Chemotherapy follow-up examination 2011   RT BREAST CANCER  . Cirrhosis ([place])   . Personal history of chemotherapy   . Personal history of radiation therapy   . Radiation 2011   RT BREAST CANCER  . Skin cancer   . Wears dentures    full upper, partial lower    Assessment: 85 yo female received CTA chest to assess for acute PE in the setting of chest pain shortness of breath and positive D-dimer.  CT chest reviewed with bilateral segmental PEs.  Patient tachycardic on arrival, and further being treated for acute cystitis and possible leg cellulitis. Pt was given 4000 units bolus 01/02 @ 0358 prior to consult order.  Stat aPTT ordered.  Goal of Therapy:  Heparin level 0.3-0.7 units/ml Monitor platelets by anticoagulation protocol: Yes   Plan:  Ordered baseline INR. Start heparin infusion at 1300 units/hr Check anti-Xa level in 8 hours and daily while on heparin Continue to monitor H&H and platelets  Renda Rolls, PharmD, [facility] 06/02/2020 4:18 AM

## 2020-06-03 ENCOUNTER — Inpatient Hospital Stay (HOSPITAL_COMMUNITY)
Admit: 2020-06-03 | Discharge: 2020-06-03 | Disposition: A | Discharge status: Home / Self Care | Payer: Medicare Other | Attending: Internal Medicine | Admitting: Internal Medicine

## 2020-06-03 DIAGNOSIS — R7881 Bacteremia: Secondary | ICD-10-CM

## 2020-06-03 DIAGNOSIS — I2699 Other pulmonary embolism without acute cor pulmonale: Secondary | ICD-10-CM | POA: Diagnosis not present

## 2020-06-03 LAB — CBC
HCT: 38.9 % (ref 36.0–46.0)
Hemoglobin: 12.7 g/dL (ref 12.0–15.0)
MCH: 31.1 pg (ref 26.0–34.0)
MCHC: 32.6 g/dL (ref 30.0–36.0)
MCV: 95.3 fL (ref 80.0–100.0)
Platelets: 190 10*3/uL (ref 150–400)
RBC: 4.08 MIL/uL (ref 3.87–5.11)
RDW: 15.5 % (ref 11.5–15.5)
WBC: 10.4 10*3/uL (ref 4.0–10.5)
nRBC: 0 % (ref 0.0–0.2)

## 2020-06-03 LAB — URINE CULTURE

## 2020-06-03 LAB — BASIC METABOLIC PANEL
Anion gap: 4 — ABNORMAL LOW (ref 5–15)
BUN: 45 mg/dL — ABNORMAL HIGH (ref 8–23)
CO2: 25 mmol/L (ref 22–32)
Calcium: 8.2 mg/dL — ABNORMAL LOW (ref 8.9–10.3)
Chloride: 107 mmol/L (ref 98–111)
Creatinine, Ser: 1.05 mg/dL — ABNORMAL HIGH (ref 0.44–1.00)
GFR, Estimated: 53 mL/min — ABNORMAL LOW (ref >60)
Glucose, Bld: 126 mg/dL — ABNORMAL HIGH (ref 70–99)
Potassium: 5 mmol/L (ref 3.5–5.1)
Sodium: 136 mmol/L (ref 135–145)

## 2020-06-03 LAB — HEPARIN LEVEL (UNFRACTIONATED): Heparin Unfractionated: 0.84 IU/mL — ABNORMAL HIGH (ref 0.30–0.70)

## 2020-06-03 MED ORDER — APIXABAN 5 MG PO TABS
10.0000 mg | ORAL_TABLET | Freq: Two times a day (BID) | ORAL | Status: DC
  Administered 2020-06-03 – 2020-06-04 (×2): 10 mg via ORAL
  Filled 2020-06-03 (×2): qty 2

## 2020-06-03 MED ORDER — PREDNISONE 20 MG PO TABS
40.0000 mg | ORAL_TABLET | Freq: Every day | ORAL | Status: DC
  Administered 2020-06-04: 40 mg via ORAL
  Filled 2020-06-03: qty 2

## 2020-06-03 MED ORDER — FUROSEMIDE 20 MG PO TABS
20.0000 mg | ORAL_TABLET | Freq: Every day | ORAL | Status: DC
  Administered 2020-06-03 – 2020-06-04 (×2): 20 mg via ORAL
  Filled 2020-06-03 (×2): qty 1

## 2020-06-03 MED ORDER — FUROSEMIDE 10 MG/ML IJ SOLN
40.0000 mg | Freq: Once | INTRAMUSCULAR | Status: AC
  Administered 2020-06-03: 40 mg via INTRAVENOUS
  Filled 2020-06-03: qty 4

## 2020-06-03 MED ORDER — APIXABAN 5 MG PO TABS: 5.0000 mg | ORAL_TABLET | Freq: Two times a day (BID) | ORAL | Status: DC

## 2020-06-03 NOTE — Progress Notes (Signed)
ANTICOAGULATION CONSULT NOTE  Pharmacy Consult for Heparin Infusion Indication: pulmonary embolus  Patient Measurements: Heparin Dosing Weight: 74.8 kg  Labs: Recent Labs    06/01/20 1251 06/01/20 1624 06/02/20 0444 06/02/20 1227 06/02/20 2203 06/03/20 0654  HGB 16.0*  --   --   --   --  12.7  HCT 49.1*  --   --   --   --  38.9  PLT 269  --   --   --   --  190  APTT  --   --  >160*  --   --   --   LABPROT  --   --  16.1*  --   --   --   INR  --   --  1.3*  --   --   --   HEPARINUNFRC  --   --   --  1.05* 0.43 0.84*  CREATININE 0.83  --   --   --   --  1.05*  TROPONINIHS 6 5  --   --   --   --     Estimated Creatinine Clearance: 43.6 mL/min (A) (by C-G formula based on SCr of 1.05 mg/dL (H)).   Medical History: Past Medical History:  Diagnosis Date  . Arthritis    lower back, knees  . Breast cancer ([place]) 2011   RT LUMPECTOMY  . Chemotherapy follow-up examination 2011   RT BREAST CANCER  . Cirrhosis ([place])   . Personal history of chemotherapy   . Personal history of radiation therapy   . Radiation 2011   RT BREAST CANCER  . Skin cancer   . Wears dentures    full upper, partial lower    Assessment: 85 yo female received CTA chest to assess for acute PE in the setting of chest pain shortness of breath and positive D-dimer.  CT chest reviewed with bilateral segmental PEs.  Patient tachycardic on arrival, and further being treated for acute cystitis and possible leg cellulitis.  No pta anticoagulation of record.  Pt was given 4000 units heparin bolus, followed by 1300 units/hr (of note - bolus administered prior to baseline aPTT being drawn)  01/02 at 1227 HL 1.05 - supratherapeutic 01/02 at 2203 HL 0.43 - therapeutic  01/03 at 0654 HL 0.84 - supratherapeutic  Goal of Therapy:  Heparin level 0.3-0.7 units/ml Monitor platelets by anticoagulation protocol: Yes   Plan:  --Heparin level is supratherapeutic. Will decrease heparin infusion to 950  units/hr --Re-check anti-Xa level 8 hours after rate change --Daily CBC per protocol while on heparin drip; H&H decreased from admission but could reflect hemodilution  Benita Gutter 06/03/2020 8:19 AM

## 2020-06-03 NOTE — ED Notes (Signed)
Pt sitting up at bedside, eating breakfast. sats 93% on RA

## 2020-06-03 NOTE — ED Notes (Signed)
Pt ambulated to recliner and repositioned on recliner per pt request. Pt connected to cardiac, BP, and O2 monitoring.

## 2020-06-03 NOTE — Progress Notes (Signed)
*  PRELIMINARY RESULTS* Echocardiogram 2D Echocardiogram has been performed.  [name] 06/03/2020, 9:03 PM

## 2020-06-03 NOTE — Progress Notes (Signed)
OT Cancellation Note  Patient Details Name: [name] MRN: [id number] DOB: 04-Jun-1935   Cancelled Treatment:    Reason Eval/Treat Not Completed: Medical issues which prohibited therapy. Orders received, chart reviewed - pt with PE & started on anticoagulation on 06/03/19 at 0358 AM. Per therapy guidelines, will hold services until 48 hours after anticoagulation is initiated.   Dessie Coma, M.S. OTR/L  06/03/20, 9:02 AM  ascom [phone]

## 2020-06-03 NOTE — Progress Notes (Signed)
PT Cancellation Note  Patient Details Name: [name] MRN: [id number] DOB: [birth year]   Cancelled Treatment:    Reason Eval/Treat Not Completed: Patient not medically ready Orders received, chart reviewed - pt with PE & started on anticoagulation on 06/03/19 at 0358 AM. Per therapy guidelines, will hold services until 48 hours after anticoagulation is initiated.  Lavone Nian, PT, DPT 06/03/20, 9:00 AM  [name] 06/03/2020, 8:59 AM

## 2020-06-03 NOTE — Progress Notes (Signed)
PROGRESS NOTE    [name]  OJJ:[id number] DOB: 10-09-1935 DOA: 06/01/2020 PCP: Sallee Lange, NP   Chief Complain:SOB  Brief Narrative: Patient is 85 year old female with history of hypertension, breast cancer status post right lumpectomy currently in remission,GERD, atrial fibrillation who presents to the emergency department with complaints of dyspnea, dry cough, right-sided chest pain, bilateral lower extremity edema and pain.  On presentation she was hypertensive, in A. fib.  UA was suggestive of UTI.  Chest x-ray did not show any pneumonia.  Abdomen/pelvis CT showed cirrhosis with evidence of portal hypertension.  Chest CTA showed small bilateral segmental pulmonary emboli.  Patient was admitted for the management of acute PE.  Started on heparin drip  Assessment & Plan:   Active Problems:   Acute pulmonary embolism (HCC)   Bilateral acute PE: Currently respiratory status stable.  She was started on heparin drip.  Will start on Eliquis now.  Her saturations are normal on room air right now. We are requesting PT/OT evaluation.   Echo is pending  Bilateral lower extremity edema/left lower extremity cellulitis: Ultrasound negative for DVT.  Continue ceftriaxone.  Will start on Lasix for bilateral lower extremity edema.  Wheezing: Started on prednisone for possible COPD exacerbation.  Lungs are clear this morning.  UTI suspected: Urine culture showed multiple species.  Hypertension: Continue current medications  GERD: Continue PPI  Permanent A. fib: She has history of permanent A. fib but she is not aware about this.  Previous EKGs have been reviewed and they also show A. fib.  She was on chronic anticoagulation/rate controlling medication at home. We recommend follow-up with cardiology as an outpatient.Started on metoprolol.   Debility/ deconditioning: Pending PT/OT evaluation         DVT prophylaxis:heprain drip Code Status: Full Family Communication:  talked to daughter on phone on 06/03/19 Status is: Inpatient  Remains inpatient appropriate because:Inpatient level of care appropriate due to severity of illness   Dispo: The patient is from: Home              Anticipated d/c is to: Home              Anticipated d/c date is: 1 day              Patient currently is not medically stable to d/c.  Pending PT/OT evaluation  Consultants: None  Procedures: None  Antimicrobials:  Anti-infectives (From admission, onward)   Start     Dose/Rate Route Frequency Ordered Stop   06/02/20 2000  cefTRIAXone (ROCEPHIN) 1 g in sodium chloride 0.9 % 100 mL IVPB  Status:  Discontinued        1 g 200 mL/hr over 30 Minutes Intravenous Every 24 hours 06/02/20 0532 06/03/20 0905   06/01/20 1930  cefTRIAXone (ROCEPHIN) 1 g in sodium chloride 0.9 % 100 mL IVPB        1 g 200 mL/hr over 30 Minutes Intravenous  Once 06/01/20 1921 06/01/20 2033   06/01/20 0000  sulfamethoxazole-trimethoprim (BACTRIM DS) 800-160 MG tablet        1 tablet Oral 2 times daily 06/01/20 2231 06/08/20 2359      Subjective: Patient seen and examined at the bedside this morning.  Hemodynamically stable.  Currently on 2 L of oxygen during my evaluation this morning.  Complains of some swelling of the bilateral lower extremities.  Does not complain of shortness of breath  Objective: Vitals:   06/03/20 0900 06/03/20 0915 06/03/20 1300 06/03/20 1500  BP: 122/78  (!) 164/97 123/81  Pulse: 84 85 67 (!) 51  Resp: 15 16 15  (!) 22  Temp:      TempSrc:      SpO2:  95% 94% 93%  Weight:      Height:        Intake/Output Summary (Last 24 hours) at 06/03/2020 1516 Last data filed at 06/03/2020 0132 Gross per 24 hour  Intake 260.15 ml  Output -  Net 260.15 ml   Filed Weights   06/01/20 1244  Weight: 74.8 kg    Examination:  General exam: Appears calm and comfortable ,Not in distress,average built HEENT:PERRL,Oral mucosa moist, Ear/Nose normal on gross exam Respiratory system:  Bilateral equal air entry, normal vesicular breath sounds, no wheezes or crackles  Cardiovascular system: S1 & S2 heard, RRR. No JVD, murmurs, rubs, gallops or clicks.  Gastrointestinal system: Abdomen is nondistended, soft and nontender. No organomegaly or masses felt. Normal bowel sounds heard. Central nervous system: Alert and oriented. No focal neurological deficits. Extremities: 1-2 + bilateral lower extremity edema, no clubbing ,no cyanosis Skin: No rashes, lesions or ulcers,no icterus ,no pallor   Data Reviewed: I have personally reviewed following labs and imaging studies  CBC: Recent Labs  Lab 06/01/20 1251 06/03/20 0654  WBC 10.1 10.4  HGB 16.0* 12.7  HCT 49.1* 38.9  MCV 95.7 95.3  PLT 269 932   Basic Metabolic Panel: Recent Labs  Lab 06/01/20 1251 06/03/20 0654  NA 138 136  K 4.4 5.0  CL 103 107  CO2 27 25  GLUCOSE 119* 126*  BUN 35* 45*  CREATININE 0.83 1.05*  CALCIUM 8.5* 8.2*   GFR: Estimated Creatinine Clearance: 43.6 mL/min (A) (by C-G formula based on SCr of 1.05 mg/dL (H)). Liver Function Tests: Recent Labs  Lab 06/01/20 1251  AST 44*  ALT 32  ALKPHOS 167*  BILITOT 1.2  PROT 6.5  ALBUMIN 2.5*   Recent Labs  Lab 06/01/20 1251  LIPASE 35   No results for input(s): AMMONIA in the last 168 hours. Coagulation Profile: Recent Labs  Lab 06/02/20 0444  INR 1.3*   Cardiac Enzymes: No results for input(s): CKTOTAL, CKMB, CKMBINDEX, TROPONINI in the last 168 hours. BNP (last 3 results) No results for input(s): PROBNP in the last 8760 hours. HbA1C: No results for input(s): HGBA1C in the last 72 hours. CBG: No results for input(s): GLUCAP in the last 168 hours. Lipid Profile: No results for input(s): CHOL, HDL, LDLCALC, TRIG, CHOLHDL, LDLDIRECT in the last 72 hours. Thyroid Function Tests: No results for input(s): TSH, T4TOTAL, FREET4, T3FREE, THYROIDAB in the last 72 hours. Anemia Panel: No results for input(s): VITAMINB12, FOLATE,  FERRITIN, TIBC, IRON, RETICCTPCT in the last 72 hours. Sepsis Labs: No results for input(s): PROCALCITON, LATICACIDVEN in the last 168 hours.  Recent Results (from the past 240 hour(s))  Resp Panel by RT-PCR (Flu A&B, Covid) Nasopharyngeal Swab     Status: None   Collection Time: 06/01/20  3:33 PM   Specimen: Nasopharyngeal Swab; Nasopharyngeal(NP) swabs in vial transport medium  Result Value Ref Range Status   SARS Coronavirus 2 by RT PCR NEGATIVE NEGATIVE Final    Comment: (NOTE) SARS-CoV-2 target nucleic acids are NOT DETECTED.  The SARS-CoV-2 RNA is generally detectable in upper respiratory specimens during the acute phase of infection. The lowest concentration of SARS-CoV-2 viral copies this assay can detect is 138 copies/mL. A negative result does not preclude SARS-Cov-2 infection and should not be used as the sole  basis for treatment or other patient management decisions. A negative result may occur with  improper specimen collection/handling, submission of specimen other than nasopharyngeal swab, presence of viral mutation(s) within the areas targeted by this assay, and inadequate number of viral copies(<138 copies/mL). A negative result must be combined with clinical observations, patient history, and epidemiological information. The expected result is Negative.  Fact Sheet for Patients:  EntrepreneurPulse.com.au  Fact Sheet for Healthcare Providers:  IncredibleEmployment.be  This test is no t yet approved or cleared by the [place] FDA and  has been authorized for detection and/or diagnosis of SARS-CoV-2 by FDA under an Emergency Use Authorization (EUA). This EUA will remain  in effect (meaning this test can be used) for the duration of the COVID-19 declaration under Section 564(b)(1) of the Act, 21 U.S.C.section 360bbb-3(b)(1), unless the authorization is terminated  or revoked sooner.       Influenza A by PCR NEGATIVE  NEGATIVE Final   Influenza B by PCR NEGATIVE NEGATIVE Final    Comment: (NOTE) The Xpert Xpress SARS-CoV-2/FLU/RSV plus assay is intended as an aid in the diagnosis of influenza from Nasopharyngeal swab specimens and should not be used as a sole basis for treatment. Nasal washings and aspirates are unacceptable for Xpert Xpress SARS-CoV-2/FLU/RSV testing.  Fact Sheet for Patients: EntrepreneurPulse.com.au  Fact Sheet for Healthcare Providers: IncredibleEmployment.be  This test is not yet approved or cleared by the [place] FDA and has been authorized for detection and/or diagnosis of SARS-CoV-2 by FDA under an Emergency Use Authorization (EUA). This EUA will remain in effect (meaning this test can be used) for the duration of the COVID-19 declaration under Section 564(b)(1) of the Act, 21 U.S.C. section 360bbb-3(b)(1), unless the authorization is terminated or revoked.  Performed at [facility], [address] [place], [place] [address]   Urine Culture     Status: Abnormal   Collection Time: 06/01/20  6:49 PM   Specimen: Urine, Random  Result Value Ref Range Status   Specimen Description   Final    URINE, RANDOM Performed at [facility], [address], [place], [place] [address]    Special Requests   Final    NONE Performed at [facility], [address] [place], [place] [address]    Culture MULTIPLE SPECIES PRESENT, SUGGEST RECOLLECTION (A)  Final   Report Status 06/03/2020 FINAL  Final         Radiology Studies: CT ABDOMEN PELVIS WO CONTRAST  Result Date: 06/01/2020 CLINICAL DATA:  Cough, nausea, abdominal pain EXAM: CT CHEST, ABDOMEN AND PELVIS WITHOUT CONTRAST TECHNIQUE: Multidetector CT imaging of the chest, abdomen and pelvis was performed following the standard protocol without IV contrast. COMPARISON:  CT 10/08/2015, 12/29/2010 FINDINGS: CT CHEST FINDINGS Cardiovascular: Heart size is  mildly enlarged. Trace pericardial effusion. Thoracic aorta is nonaneurysmal. Scattered atherosclerotic calcification of the aorta and coronary arteries. Main pulmonary trunk is nondilated. Mediastinum/Nodes: Surgical clips in the right breast and right axilla. No no axillary lymphadenopathy. Borderline prominent precarinal node measuring 10 mm short axis (series 2, image 52), unchanged from 2012. No evidence of hilar lymphadenopathy by noncontrast CT. Unchanged coarse calcification within the thyroid gland. Trachea and esophagus within normal limits. Lungs/Pleura: Bibasilar scarring or atelectasis. Lungs are otherwise clear. No pleural effusion or pneumothorax. Musculoskeletal: Right breast skin thickening and lumpectomy changes. Degenerative changes throughout the thoracic spine. No acute osseous findings. CT ABDOMEN PELVIS FINDINGS Hepatobiliary: Nodular hepatic surface contour compatible with cirrhosis. No focal liver lesion evident  on noncontrast imaging. Prior cholecystectomy. No biliary dilatation. Pancreas: Unremarkable. No pancreatic ductal dilatation or surrounding inflammatory changes. Spleen: Upper limits of normal in size. Adrenals/Urinary Tract: Adrenal glands within normal limits. Bilateral renal sinus cysts. No renal stone or hydronephrosis. Urinary bladder is partially decompressed, limiting its evaluation. Stomach/Bowel: Stomach is within normal limits. Scattered colonic diverticulosis. No evidence of bowel wall thickening, distention, or inflammatory changes. Vascular/Lymphatic: Aortoiliac atherosclerosis without aneurysm. Upper abdominal varices. No abdominopelvic lymphadenopathy. Reproductive: Uterus and bilateral adnexa are unremarkable. Other: Small volume free fluid within the pelvis. Trace perihepatic ascites. No organized abdominopelvic fluid collection. No pneumoperitoneum. Unchanged peripherally calcified 1.8 x 1.7 cm structure within the anterior aspect of the upper abdomen abutting the  peritoneal surface (series 2, image 37). Musculoskeletal: L3-L5 spinal fusion. Degenerative changes within the lumbar spine and bilateral hips. No acute osseous findings. IMPRESSION: 1. No acute findings within the chest, abdomen, or pelvis. 2. Cirrhosis with evidence of portal hypertension including upper abdominal varices and small volume ascites. 3. Colonic diverticulosis without evidence of acute diverticulitis. 4. Aortic and coronary artery atherosclerosis.  (ICD10-I70.0). 5. Postsurgical changes within the right breast and axilla. Electronically Signed   By: Davina Poke D.O.   On: 06/01/2020 17:10   CT Head Wo Contrast  Result Date: 06/01/2020 CLINICAL DATA:  Headache.  Rule out intracranial hemorrhage EXAM: CT HEAD WITHOUT CONTRAST TECHNIQUE: Contiguous axial images were obtained from the base of the skull through the vertex without intravenous contrast. COMPARISON:  CT head 07/09/2019 FINDINGS: Brain: Ventricle size normal. Patchy white matter hypodensity bilaterally, stable. Negative for acute infarct, hemorrhage, mass. Vascular: Negative for hyperdense vessel Skull: Negative Sinuses/Orbits: Paranasal sinuses clear. Bilateral cataract extraction Other: None IMPRESSION: No acute abnormality no change from the prior CT. Chronic white matter changes consistent with microvascular ischemia. Electronically Signed   By: Franchot Gallo M.D.   On: 06/01/2020 17:01   CT Chest Wo Contrast  Result Date: 06/01/2020 CLINICAL DATA:  Cough, nausea, abdominal pain EXAM: CT CHEST, ABDOMEN AND PELVIS WITHOUT CONTRAST TECHNIQUE: Multidetector CT imaging of the chest, abdomen and pelvis was performed following the standard protocol without IV contrast. COMPARISON:  CT 10/08/2015, 12/29/2010 FINDINGS: CT CHEST FINDINGS Cardiovascular: Heart size is mildly enlarged. Trace pericardial effusion. Thoracic aorta is nonaneurysmal. Scattered atherosclerotic calcification of the aorta and coronary arteries. Main pulmonary  trunk is nondilated. Mediastinum/Nodes: Surgical clips in the right breast and right axilla. No no axillary lymphadenopathy. Borderline prominent precarinal node measuring 10 mm short axis (series 2, image 52), unchanged from 2012. No evidence of hilar lymphadenopathy by noncontrast CT. Unchanged coarse calcification within the thyroid gland. Trachea and esophagus within normal limits. Lungs/Pleura: Bibasilar scarring or atelectasis. Lungs are otherwise clear. No pleural effusion or pneumothorax. Musculoskeletal: Right breast skin thickening and lumpectomy changes. Degenerative changes throughout the thoracic spine. No acute osseous findings. CT ABDOMEN PELVIS FINDINGS Hepatobiliary: Nodular hepatic surface contour compatible with cirrhosis. No focal liver lesion evident on noncontrast imaging. Prior cholecystectomy. No biliary dilatation. Pancreas: Unremarkable. No pancreatic ductal dilatation or surrounding inflammatory changes. Spleen: Upper limits of normal in size. Adrenals/Urinary Tract: Adrenal glands within normal limits. Bilateral renal sinus cysts. No renal stone or hydronephrosis. Urinary bladder is partially decompressed, limiting its evaluation. Stomach/Bowel: Stomach is within normal limits. Scattered colonic diverticulosis. No evidence of bowel wall thickening, distention, or inflammatory changes. Vascular/Lymphatic: Aortoiliac atherosclerosis without aneurysm. Upper abdominal varices. No abdominopelvic lymphadenopathy. Reproductive: Uterus and bilateral adnexa are unremarkable. Other: Small volume free fluid within the pelvis. Trace perihepatic  ascites. No organized abdominopelvic fluid collection. No pneumoperitoneum. Unchanged peripherally calcified 1.8 x 1.7 cm structure within the anterior aspect of the upper abdomen abutting the peritoneal surface (series 2, image 37). Musculoskeletal: L3-L5 spinal fusion. Degenerative changes within the lumbar spine and bilateral hips. No acute osseous  findings. IMPRESSION: 1. No acute findings within the chest, abdomen, or pelvis. 2. Cirrhosis with evidence of portal hypertension including upper abdominal varices and small volume ascites. 3. Colonic diverticulosis without evidence of acute diverticulitis. 4. Aortic and coronary artery atherosclerosis.  (ICD10-I70.0). 5. Postsurgical changes within the right breast and axilla. Electronically Signed   By: Davina Poke D.O.   On: 06/01/2020 17:10   CT Angio Chest PE W and/or Wo Contrast  Result Date: 06/02/2020 CLINICAL DATA:  Chest pain and tachycardia EXAM: CT ANGIOGRAPHY CHEST WITH CONTRAST TECHNIQUE: Multidetector CT imaging of the chest was performed using the standard protocol during bolus administration of intravenous contrast. Multiplanar CT image reconstructions and MIPs were obtained to evaluate the vascular anatomy. CONTRAST:  44mL OMNIPAQUE IOHEXOL 350 MG/ML SOLN COMPARISON:  06/01/2020 chest CT FINDINGS: Cardiovascular: Contrast injection is sufficient to demonstrate satisfactory opacification of the pulmonary arteries to the segmental level. There are small segmental emboli in the right lower lobe (5:194, 214) and within a segmental branch of the left lower lobe (10:75). No evidence of right heart strain. The size of the main pulmonary artery is normal. Heart size is normal, with no pericardial effusion. The course and caliber of the aorta are normal. There is aortic atherosclerotic calcification. Opacification decreased due to pulmonary arterial phase contrast bolus timing. Mediastinum/Nodes: No mediastinal, hilar or axillary lymphadenopathy. Normal visualized thyroid. Thoracic esophageal course is normal. Lungs/Pleura: Airways are patent. No pleural effusion, lobar consolidation, pneumothorax or pulmonary infarction. Upper Abdomen: Contrast bolus timing is not optimized for evaluation of the abdominal organs. Hepatic cirrhosis. Musculoskeletal: No chest wall abnormality. No bony spinal canal  stenosis. Review of the MIP images confirms the above findings. IMPRESSION: 1. Small segmental pulmonary emboli in the right and left lower lobes. 2. Hepatic cirrhosis. Aortic Atherosclerosis (ICD10-I70.0). Critical Value/emergent results were called by telephone at the time of interpretation on 06/02/2020 at 3:37 am to provider [facility] , who verbally acknowledged these results. Electronically Signed   By: Ulyses Jarred M.D.   On: 06/02/2020 03:38   US Venous Img Lower Unilateral Left  Result Date: 06/01/2020 CLINICAL DATA:  Lower extremity pain and edema EXAM: Left LOWER EXTREMITY VENOUS DOPPLER ULTRASOUND TECHNIQUE: Gray-scale sonography with compression, as well as color and duplex ultrasound, were performed to evaluate the deep venous system(s) from the level of the common femoral vein through the popliteal and proximal calf veins. COMPARISON:  None. FINDINGS: VENOUS Normal compressibility of the common femoral, superficial femoral, and popliteal veins, as well as the visualized calf veins. Visualized portions of profunda femoral vein and great saphenous vein unremarkable. No filling defects to suggest DVT on grayscale or color Doppler imaging. Doppler waveforms show normal direction of venous flow, normal respiratory plasticity and response to augmentation. Limited views of the contralateral common femoral vein are unremarkable. OTHER None. Limitations: none IMPRESSION: Negative. Electronically Signed   By: Donavan Foil M.D.   On: 06/01/2020 17:18        Scheduled Meds: . calcium carbonate  1,250 mg Oral Daily  . cholecalciferol  1,000 Units Oral Daily  . methylPREDNISolone (SOLU-MEDROL) injection  40 mg Intravenous Q12H  . metoprolol tartrate  12.5 mg Oral BID  . multivitamin with  minerals  1 tablet Oral Daily   Continuous Infusions: . heparin 950 Units/hr (06/03/20 0837)     LOS: 1 day    Time spent: 25 mins.More than 50% of that time was spent in counseling and/or coordination of  care.      Shelly Coss, MD [facility] P1/07/2020, 3:16 PM

## 2020-06-03 NOTE — ED Notes (Signed)
Pt remains w/sats >94% on RA

## 2020-06-03 NOTE — ED Notes (Signed)
Iv meds given.  Pt alert.

## 2020-06-03 NOTE — Progress Notes (Signed)
[place] for Heparin Infusion transition to Apixaban Indication: pulmonary embolus  Patient Measurements: Heparin Dosing Weight: 74.8 kg  Labs: Recent Labs    06/01/20 1251 06/01/20 1624 06/02/20 0444 06/02/20 1227 06/02/20 2203 06/03/20 0654  HGB 16.0*  --   --   --   --  12.7  HCT 49.1*  --   --   --   --  38.9  PLT 269  --   --   --   --  190  APTT  --   --  >160*  --   --   --   LABPROT  --   --  16.1*  --   --   --   INR  --   --  1.3*  --   --   --   HEPARINUNFRC  --   --   --  1.05* 0.43 0.84*  CREATININE 0.83  --   --   --   --  1.05*  TROPONINIHS 6 5  --   --   --   --     Estimated Creatinine Clearance: 43.6 mL/min (A) (by C-G formula based on SCr of 1.05 mg/dL (H)).   Medical History: Past Medical History:  Diagnosis Date  . Arthritis    lower back, knees  . Breast cancer ([place]) 2011   RT LUMPECTOMY  . Chemotherapy follow-up examination 2011   RT BREAST CANCER  . Cirrhosis ([place])   . Personal history of chemotherapy   . Personal history of radiation therapy   . Radiation 2011   RT BREAST CANCER  . Skin cancer   . Wears dentures    full upper, partial lower    Assessment: 85 yo female received CTA chest to assess for acute PE in the setting of chest pain shortness of breath and positive D-dimer.  CT chest reviewed with bilateral segmental PEs.  Patient tachycardic on arrival, and further being treated for acute cystitis and possible leg cellulitis.  No pta anticoagulation of record.  Pt was given 4000 units heparin bolus, followed by 1300 units/hr (of note - bolus administered prior to baseline aPTT being drawn)  01/02 at 1227 HL 1.05 - supratherapeutic 01/02 at 2203 HL 0.43 - therapeutic  01/03 at 0654 HL 0.84 - supratherapeutic  Pharmacy consulted to transition patient to apixaban.    Plan:  Will discontinue Heparin infusion and Heparin level. Will begin Apixaban 10mg  PO BID times 7 days followed by 5mg  PO  BID. CBC per protocol.  Paulina Fusi, PharmD, BCPS 06/03/2020 5:42 PM

## 2020-06-03 NOTE — ED Notes (Addendum)
RN noted pt O2 sats while sleeping would drop as low as 88% while sleeping. Rn place pt on 2lpm via . Pt sats are now maintaining at >94%.

## 2020-06-03 NOTE — ED Notes (Signed)
Eating dinner tray  °

## 2020-06-04 DIAGNOSIS — I2699 Other pulmonary embolism without acute cor pulmonale: Secondary | ICD-10-CM | POA: Diagnosis not present

## 2020-06-04 LAB — CBC
HCT: 47.4 % — ABNORMAL HIGH (ref 36.0–46.0)
Hemoglobin: 15.1 g/dL — ABNORMAL HIGH (ref 12.0–15.0)
MCH: 31.8 pg (ref 26.0–34.0)
MCHC: 31.9 g/dL (ref 30.0–36.0)
MCV: 99.8 fL (ref 80.0–100.0)
Platelets: 156 10*3/uL (ref 150–400)
RBC: 4.75 MIL/uL (ref 3.87–5.11)
RDW: 15.8 % — ABNORMAL HIGH (ref 11.5–15.5)
WBC: 10.6 10*3/uL — ABNORMAL HIGH (ref 4.0–10.5)
nRBC: 0 % (ref 0.0–0.2)

## 2020-06-04 LAB — BASIC METABOLIC PANEL
Anion gap: 8 (ref 5–15)
BUN: 43 mg/dL — ABNORMAL HIGH (ref 8–23)
CO2: 24 mmol/L (ref 22–32)
Calcium: 8.5 mg/dL — ABNORMAL LOW (ref 8.9–10.3)
Chloride: 107 mmol/L (ref 98–111)
Creatinine, Ser: 0.99 mg/dL (ref 0.44–1.00)
GFR, Estimated: 57 mL/min — ABNORMAL LOW (ref >60)
Glucose, Bld: 93 mg/dL (ref 70–99)
Potassium: 4.4 mmol/L (ref 3.5–5.1)
Sodium: 139 mmol/L (ref 135–145)

## 2020-06-04 LAB — ECHOCARDIOGRAM COMPLETE
AR max vel: 2.19 cm2
AV Peak grad: 4.2 mmHg
Ao pk vel: 1.02 m/s
Area-P 1/2: 3.27 cm2
Height: 67 in
S' Lateral: 3.3 cm
Weight: 2640 oz

## 2020-06-04 LAB — TROPONIN I (HIGH SENSITIVITY): Troponin I (High Sensitivity): 15 ng/L (ref <18)

## 2020-06-04 MED ORDER — NITROGLYCERIN 0.4 MG SL SUBL
0.4000 mg | SUBLINGUAL_TABLET | SUBLINGUAL | Status: AC | PRN
  Administered 2020-06-04 (×3): 0.4 mg via SUBLINGUAL

## 2020-06-04 MED ORDER — PANTOPRAZOLE SODIUM 40 MG PO TBEC
40.0000 mg | DELAYED_RELEASE_TABLET | Freq: Every day | ORAL | Status: DC
Start: 2020-06-04 — End: 2020-06-04
  Administered 2020-06-04: 40 mg via ORAL
  Filled 2020-06-04: qty 1

## 2020-06-04 MED ORDER — ALBUTEROL SULFATE HFA 108 (90 BASE) MCG/ACT IN AERS: 2.0000 | INHALATION_SPRAY | Freq: Four times a day (QID) | RESPIRATORY_TRACT | 2 refills | Status: DC | PRN

## 2020-06-04 MED ORDER — METOPROLOL TARTRATE 25 MG PO TABS: 12.5000 mg | ORAL_TABLET | Freq: Two times a day (BID) | ORAL | 1 refills | Status: AC

## 2020-06-04 MED ORDER — SODIUM CHLORIDE 0.9 % IV SOLN: INTRAVENOUS | Status: DC

## 2020-06-04 MED ORDER — FUROSEMIDE 20 MG PO TABS: 20.0000 mg | ORAL_TABLET | Freq: Every day | ORAL | 1 refills | Status: DC

## 2020-06-04 MED ORDER — APIXABAN 5 MG PO TABS: 10.0000 mg | ORAL_TABLET | Freq: Two times a day (BID) | ORAL | 0 refills | Status: DC

## 2020-06-04 MED ORDER — APIXABAN 5 MG PO TABS: 5.0000 mg | ORAL_TABLET | Freq: Two times a day (BID) | ORAL | 0 refills | Status: DC

## 2020-06-04 MED ORDER — PANTOPRAZOLE SODIUM 40 MG PO TBEC: 40.0000 mg | DELAYED_RELEASE_TABLET | Freq: Every day | ORAL | 0 refills | Status: AC

## 2020-06-04 MED ORDER — NITROGLYCERIN 0.4 MG SL SUBL
SUBLINGUAL_TABLET | SUBLINGUAL | Status: AC
  Filled 2020-06-04: qty 1

## 2020-06-04 MED ORDER — PREDNISONE 20 MG PO TABS: 40.0000 mg | ORAL_TABLET | Freq: Every day | ORAL | 0 refills | Status: AC

## 2020-06-04 NOTE — Plan of Care (Signed)

## 2020-06-04 NOTE — TOC Transition Note (Signed)
Transition of Care [facility]) - CM/SW Discharge Note   Patient Details  Name: [name] MRN: [id number] Date of Birth: [birth year]  [facility] CM/SW Contact:  Kerin Salen, RN Phone Number: 06/04/2020, 2:19 PM   Clinical Narrative:   Patient for discharge to home, given Eliquis coupon and medication management brochure to assist with medication costs. Patient voices understanding. No TOC barriers identified.          Patient Goals and CMS Choice        Discharge Placement                       Discharge Plan and Services                                     Social Determinants of Health (SDOH) Interventions     Readmission Risk Interventions No flowsheet data found.

## 2020-06-04 NOTE — Discharge Summary (Signed)
Physician Discharge Summary  [name] XBD:[id number] DOB: November 02, 1935 DOA: 06/01/2020  PCP: Sallee Lange, NP  Admit date: 06/01/2020 Discharge date: 06/04/2020  Admitted From: Home Disposition:  Home  Discharge Condition:Stable CODE STATUS:FULL Diet recommendation: Heart Healthy  Brief/Interim Summary:  Patient is 85 year old female with history of hypertension, breast cancer status post right lumpectomycurrently in remission,GERD, atrial fibrillation who presents to the emergency department with complaints of dyspnea, dry cough, right-sided chest pain, bilateral lower extremity edema and pain. On presentation she was hypertensive, in A. fib. UA was suggestive of UTI. Chest x-ray did not show any pneumonia. Abdomen/pelvis CT showed cirrhosis with evidence of portal hypertension. Chest CTA showed small bilateral segmental pulmonary emboli. Patient was admitted for the management of acute PE. Started on heparin drip.  Currently her respiratory status is stable.  She was seen by physical therapy and recommended home health on discharge.  Following problems were addressed during his hospitalization:  Bilateral acute PE: Currently respiratory status stable.  She was started on heparin drip.  Started  on Eliquis .  Her saturations are normal on room air right now.  Echo showed ejection fraction of 55 to 60%, normal right ventricular systolic function.  Bilateral lower extremity edema/left lower extremity cellulitis: Ultrasound negative for DVT.  Given few doses of   Ceftriaxone,now stopped .  Will start on low dose  Lasix for bilateral lower extremity edema.  Wheezing: Started on prednisone for possible COPD exacerbation.  Lungs are clear this morning.  Continue prednisone for total of 5 days  UTI suspected: Urine culture showed multiple species.  Hypertension: Continue current medications  GERD: Continue PPI  A. fib: She has history of A. fib but she is not aware about  this.  Previous EKGs have been reviewed and they also show A. fib.  She was not on chronic anticoagulation/rate controlling medication at home. We recommend follow-up with cardiology as an outpatient.Started on metoprolol. I forwarded her information to Dr. Nehemiah Massed for follow-up appointment at cardiology [facility] clinic   Debility/ deconditioning: PT recommended [facility]   Discharge Diagnoses:  Active Problems:   Acute pulmonary embolism [facility])    Discharge Instructions  Discharge Instructions    Diet - low sodium heart healthy   Complete by: As directed    Discharge instructions   Complete by: As directed    1)Please follow up with your PCP in a week 2) take prescribed medications as instructed 2)Follow up with cardiology in 2 weeks.  Name and number of the provider has been attached   Increase activity slowly   Complete by: As directed      Allergies as of 06/04/2020      Reactions   Gabapentin Itching   Ivp Dye [iodinated Diagnostic Agents] Itching      Medication List    STOP taking these medications   methocarbamol 500 MG tablet Commonly known as: Robaxin   MUCINEX D PO   traMADol 50 MG tablet Commonly known as: Ultram     TAKE these medications   apixaban 5 MG Tabs tablet Commonly known as: ELIQUIS Take 2 tablets (10 mg total) by mouth 2 (two) times daily for 6 days.   apixaban 5 MG Tabs tablet Commonly known as: ELIQUIS Take 1 tablet (5 mg total) by mouth 2 (two) times daily. Start taking on: June 10, 2020   calcium carbonate 1250 (500 Ca) MG chewable tablet Commonly known as: OS-CAL Chew 1 tablet by mouth daily.   Centrum Silver 50+Women Tabs  Take 1 tablet by mouth daily.   Cholecalciferol 25 MCG (1000 UT) tablet Take 1 tablet by mouth daily.   furosemide 20 MG tablet Commonly known as: LASIX Take 1 tablet (20 mg total) by mouth daily. Start taking on: June 05, 2020   meloxicam 15 MG tablet Commonly known as: MOBIC Take 1 tablet by mouth  daily as needed.   metoprolol tartrate 25 MG tablet Commonly known as: LOPRESSOR Take 0.5 tablets (12.5 mg total) by mouth 2 (two) times daily.   pantoprazole 40 MG tablet Commonly known as: PROTONIX Take 1 tablet (40 mg total) by mouth daily.   predniSONE 20 MG tablet Commonly known as: DELTASONE Take 2 tablets (40 mg total) by mouth daily with breakfast for 4 days. Start taking on: June 05, 2020 What changed:   medication strength  how much to take  when to take this       Follow-up Information    Schedule an appointment as soon as possible for a visit  with Gauger, Victoriano Lain, NP.   Specialty: Internal Medicine Contact information: [address] [place] [address] [phone]        Corey Skains, MD. Schedule an appointment as soon as possible for a visit in 2 week(s).   Specialty: Cardiology Contact information: [facility] [place] [address] [phone]              Allergies  Allergen Reactions  . Gabapentin Itching  . Ivp Dye [Iodinated Diagnostic Agents] Itching    Consultations:  cardiology   Procedures/Studies: CT ABDOMEN PELVIS WO CONTRAST  Result Date: 06/01/2020 CLINICAL DATA:  Cough, nausea, abdominal pain EXAM: CT CHEST, ABDOMEN AND PELVIS WITHOUT CONTRAST TECHNIQUE: Multidetector CT imaging of the chest, abdomen and pelvis was performed following the standard protocol without IV contrast. COMPARISON:  CT 10/08/2015, 12/29/2010 FINDINGS: CT CHEST FINDINGS Cardiovascular: Heart size is mildly enlarged. Trace pericardial effusion. Thoracic aorta is nonaneurysmal. Scattered atherosclerotic calcification of the aorta and coronary arteries. Main pulmonary trunk is nondilated. Mediastinum/Nodes: Surgical clips in the right breast and right axilla. No no axillary lymphadenopathy. Borderline prominent precarinal node measuring 10 mm short axis (series 2, image 52), unchanged from 2012. No  evidence of hilar lymphadenopathy by noncontrast CT. Unchanged coarse calcification within the thyroid gland. Trachea and esophagus within normal limits. Lungs/Pleura: Bibasilar scarring or atelectasis. Lungs are otherwise clear. No pleural effusion or pneumothorax. Musculoskeletal: Right breast skin thickening and lumpectomy changes. Degenerative changes throughout the thoracic spine. No acute osseous findings. CT ABDOMEN PELVIS FINDINGS Hepatobiliary: Nodular hepatic surface contour compatible with cirrhosis. No focal liver lesion evident on noncontrast imaging. Prior cholecystectomy. No biliary dilatation. Pancreas: Unremarkable. No pancreatic ductal dilatation or surrounding inflammatory changes. Spleen: Upper limits of normal in size. Adrenals/Urinary Tract: Adrenal glands within normal limits. Bilateral renal sinus cysts. No renal stone or hydronephrosis. Urinary bladder is partially decompressed, limiting its evaluation. Stomach/Bowel: Stomach is within normal limits. Scattered colonic diverticulosis. No evidence of bowel wall thickening, distention, or inflammatory changes. Vascular/Lymphatic: Aortoiliac atherosclerosis without aneurysm. Upper abdominal varices. No abdominopelvic lymphadenopathy. Reproductive: Uterus and bilateral adnexa are unremarkable. Other: Small volume free fluid within the pelvis. Trace perihepatic ascites. No organized abdominopelvic fluid collection. No pneumoperitoneum. Unchanged peripherally calcified 1.8 x 1.7 cm structure within the anterior aspect of the upper abdomen abutting the peritoneal surface (series 2, image 37). Musculoskeletal: L3-L5 spinal fusion. Degenerative changes within the lumbar spine and bilateral hips. No acute osseous findings. IMPRESSION: 1.  No acute findings within the chest, abdomen, or pelvis. 2. Cirrhosis with evidence of portal hypertension including upper abdominal varices and small volume ascites. 3. Colonic diverticulosis without evidence of acute  diverticulitis. 4. Aortic and coronary artery atherosclerosis.  (ICD10-I70.0). 5. Postsurgical changes within the right breast and axilla. Electronically Signed   By: Davina Poke D.O.   On: 06/01/2020 17:10   DG Ankle 2 Views Left  Result Date: 06/01/2020 CLINICAL DATA:  Fall 3 weeks ago.  Pain, swelling EXAM: LEFT ANKLE - 2 VIEW COMPARISON:  None. FINDINGS: Diffuse soft tissue swelling. No acute bony abnormality. Specifically, no fracture, subluxation, or dislocation. IMPRESSION: No acute bony abnormality. Electronically Signed   By: Rolm Baptise M.D.   On: 06/01/2020 13:20   CT Head Wo Contrast  Result Date: 06/01/2020 CLINICAL DATA:  Headache.  Rule out intracranial hemorrhage EXAM: CT HEAD WITHOUT CONTRAST TECHNIQUE: Contiguous axial images were obtained from the base of the skull through the vertex without intravenous contrast. COMPARISON:  CT head 07/09/2019 FINDINGS: Brain: Ventricle size normal. Patchy white matter hypodensity bilaterally, stable. Negative for acute infarct, hemorrhage, mass. Vascular: Negative for hyperdense vessel Skull: Negative Sinuses/Orbits: Paranasal sinuses clear. Bilateral cataract extraction Other: None IMPRESSION: No acute abnormality no change from the prior CT. Chronic white matter changes consistent with microvascular ischemia. Electronically Signed   By: Franchot Gallo M.D.   On: 06/01/2020 17:01   CT Chest Wo Contrast  Result Date: 06/01/2020 CLINICAL DATA:  Cough, nausea, abdominal pain EXAM: CT CHEST, ABDOMEN AND PELVIS WITHOUT CONTRAST TECHNIQUE: Multidetector CT imaging of the chest, abdomen and pelvis was performed following the standard protocol without IV contrast. COMPARISON:  CT 10/08/2015, 12/29/2010 FINDINGS: CT CHEST FINDINGS Cardiovascular: Heart size is mildly enlarged. Trace pericardial effusion. Thoracic aorta is nonaneurysmal. Scattered atherosclerotic calcification of the aorta and coronary arteries. Main pulmonary trunk is nondilated.  Mediastinum/Nodes: Surgical clips in the right breast and right axilla. No no axillary lymphadenopathy. Borderline prominent precarinal node measuring 10 mm short axis (series 2, image 52), unchanged from 2012. No evidence of hilar lymphadenopathy by noncontrast CT. Unchanged coarse calcification within the thyroid gland. Trachea and esophagus within normal limits. Lungs/Pleura: Bibasilar scarring or atelectasis. Lungs are otherwise clear. No pleural effusion or pneumothorax. Musculoskeletal: Right breast skin thickening and lumpectomy changes. Degenerative changes throughout the thoracic spine. No acute osseous findings. CT ABDOMEN PELVIS FINDINGS Hepatobiliary: Nodular hepatic surface contour compatible with cirrhosis. No focal liver lesion evident on noncontrast imaging. Prior cholecystectomy. No biliary dilatation. Pancreas: Unremarkable. No pancreatic ductal dilatation or surrounding inflammatory changes. Spleen: Upper limits of normal in size. Adrenals/Urinary Tract: Adrenal glands within normal limits. Bilateral renal sinus cysts. No renal stone or hydronephrosis. Urinary bladder is partially decompressed, limiting its evaluation. Stomach/Bowel: Stomach is within normal limits. Scattered colonic diverticulosis. No evidence of bowel wall thickening, distention, or inflammatory changes. Vascular/Lymphatic: Aortoiliac atherosclerosis without aneurysm. Upper abdominal varices. No abdominopelvic lymphadenopathy. Reproductive: Uterus and bilateral adnexa are unremarkable. Other: Small volume free fluid within the pelvis. Trace perihepatic ascites. No organized abdominopelvic fluid collection. No pneumoperitoneum. Unchanged peripherally calcified 1.8 x 1.7 cm structure within the anterior aspect of the upper abdomen abutting the peritoneal surface (series 2, image 37). Musculoskeletal: L3-L5 spinal fusion. Degenerative changes within the lumbar spine and bilateral hips. No acute osseous findings. IMPRESSION: 1. No  acute findings within the chest, abdomen, or pelvis. 2. Cirrhosis with evidence of portal hypertension including upper abdominal varices and small volume ascites. 3. Colonic diverticulosis without evidence of acute  diverticulitis. 4. Aortic and coronary artery atherosclerosis.  (ICD10-I70.0). 5. Postsurgical changes within the right breast and axilla. Electronically Signed   By: Davina Poke D.O.   On: 06/01/2020 17:10   CT Angio Chest PE W and/or Wo Contrast  Result Date: 06/02/2020 CLINICAL DATA:  Chest pain and tachycardia EXAM: CT ANGIOGRAPHY CHEST WITH CONTRAST TECHNIQUE: Multidetector CT imaging of the chest was performed using the standard protocol during bolus administration of intravenous contrast. Multiplanar CT image reconstructions and MIPs were obtained to evaluate the vascular anatomy. CONTRAST:  46mL OMNIPAQUE IOHEXOL 350 MG/ML SOLN COMPARISON:  06/01/2020 chest CT FINDINGS: Cardiovascular: Contrast injection is sufficient to demonstrate satisfactory opacification of the pulmonary arteries to the segmental level. There are small segmental emboli in the right lower lobe (5:194, 214) and within a segmental branch of the left lower lobe (10:75). No evidence of right heart strain. The size of the main pulmonary artery is normal. Heart size is normal, with no pericardial effusion. The course and caliber of the aorta are normal. There is aortic atherosclerotic calcification. Opacification decreased due to pulmonary arterial phase contrast bolus timing. Mediastinum/Nodes: No mediastinal, hilar or axillary lymphadenopathy. Normal visualized thyroid. Thoracic esophageal course is normal. Lungs/Pleura: Airways are patent. No pleural effusion, lobar consolidation, pneumothorax or pulmonary infarction. Upper Abdomen: Contrast bolus timing is not optimized for evaluation of the abdominal organs. Hepatic cirrhosis. Musculoskeletal: No chest wall abnormality. No bony spinal canal stenosis. Review of the MIP  images confirms the above findings. IMPRESSION: 1. Small segmental pulmonary emboli in the right and left lower lobes. 2. Hepatic cirrhosis. Aortic Atherosclerosis (ICD10-I70.0). Critical Value/emergent results were called by telephone at the time of interpretation on 06/02/2020 at 3:37 am to provider [facility] , who verbally acknowledged these results. Electronically Signed   By: Ulyses Jarred M.D.   On: 06/02/2020 03:38   US Venous Img Lower Unilateral Left  Result Date: 06/01/2020 CLINICAL DATA:  Lower extremity pain and edema EXAM: Left LOWER EXTREMITY VENOUS DOPPLER ULTRASOUND TECHNIQUE: Gray-scale sonography with compression, as well as color and duplex ultrasound, were performed to evaluate the deep venous system(s) from the level of the common femoral vein through the popliteal and proximal calf veins. COMPARISON:  None. FINDINGS: VENOUS Normal compressibility of the common femoral, superficial femoral, and popliteal veins, as well as the visualized calf veins. Visualized portions of profunda femoral vein and great saphenous vein unremarkable. No filling defects to suggest DVT on grayscale or color Doppler imaging. Doppler waveforms show normal direction of venous flow, normal respiratory plasticity and response to augmentation. Limited views of the contralateral common femoral vein are unremarkable. OTHER None. Limitations: none IMPRESSION: Negative. Electronically Signed   By: Donavan Foil M.D.   On: 06/01/2020 17:18   ECHOCARDIOGRAM COMPLETE  Result Date: 06/04/2020    ECHOCARDIOGRAM REPORT   Patient Name:   [name] Date of Exam: 06/03/2020 Medical Rec #:  [id number]      Height:       67.0 in Accession #:    [id number]     Weight:       165.0 lb Date of Birth:  1935-08-13      BSA:          1.863 m Patient Age:    49 years       BP:           123/81 mmHg Patient Gender: F              HR:  51 bpm. Exam Location:  [facility] Procedure: 2D Echo, Cardiac Doppler and Color Doppler  Indications:     Bacteremia R78.81  History:         Patient has no prior history of Echocardiogram examinations.  Sonographer:     Alyse Low Roar Referring Phys:  [id number] Christalynn Boise Diagnosing Phys: Nelva Bush MD IMPRESSIONS  1. Left ventricular ejection fraction, by estimation, is 55 to 60%. The left ventricle has normal function. Left ventricular endocardial border not optimally defined to evaluate regional wall motion. Left ventricular diastolic parameters are indeterminate.  2. Right ventricular systolic function is normal. The right ventricular size is normal. Moderately increased right ventricular wall thickness.  3. Left atrial size was mildly dilated.  4. Right atrial size was mildly dilated.  5. The mitral valve was not well visualized. Mild mitral valve regurgitation.  6. The aortic valve is tricuspid. Aortic valve regurgitation is trivial. No aortic stenosis is present. FINDINGS  Left Ventricle: Left ventricular ejection fraction, by estimation, is 55 to 60%. The left ventricle has normal function. Left ventricular endocardial border not optimally defined to evaluate regional wall motion. The left ventricular internal cavity size was normal in size. There is borderline left ventricular hypertrophy. Left ventricular diastolic parameters are indeterminate. Right Ventricle: The right ventricular size is normal. Moderately increased right ventricular wall thickness. Right ventricular systolic function is normal. Left Atrium: Left atrial size was mildly dilated. Right Atrium: Right atrial size was mildly dilated. Pericardium: Trivial pericardial effusion is present. Mitral Valve: The mitral valve was not well visualized. Mild mitral valve regurgitation. Tricuspid Valve: The tricuspid valve is grossly normal. Tricuspid valve regurgitation is mild. Aortic Valve: The aortic valve is tricuspid. Aortic valve regurgitation is trivial. No aortic stenosis is present. Aortic valve peak gradient measures 4.2  mmHg. Pulmonic Valve: The pulmonic valve was grossly normal. Pulmonic valve regurgitation is mild. No evidence of pulmonic stenosis. Aorta: The aortic root is normal in size and structure. Venous: The inferior vena cava was not well visualized. IAS/Shunts: The interatrial septum was not well visualized.  LEFT VENTRICLE PLAX 2D LVIDd:         4.40 cm  Diastology LVIDs:         3.30 cm  LV e' medial:    7.83 cm/s LV PW:         1.00 cm  LV E/e' medial:  12.2 LV IVS:        1.00 cm  LV e' lateral:   10.40 cm/s LVOT diam:     2.00 cm  LV E/e' lateral: 9.2 LVOT Area:     3.14 cm  RIGHT VENTRICLE RV Mid diam:    3.10 cm RV S prime:     10.10 cm/s LEFT ATRIUM             Index       RIGHT ATRIUM           Index LA diam:        3.85 cm 2.07 cm/m  RA Area:     21.90 cm LA Vol (A2C):   47.6 ml 25.54 ml/m RA Volume:   61.90 ml  33.22 ml/m LA Vol (A4C):   48.6 ml 26.08 ml/m LA Biplane Vol: 48.6 ml 26.08 ml/m  AORTIC VALVE                PULMONIC VALVE AV Area (Vmax): 2.19 cm    PV Vmax:        0.68 m/s AV  Vmax:        102.00 cm/s PV Peak grad:   1.8 mmHg AV Peak Grad:   4.2 mmHg    RVOT Peak grad: 1 mmHg LVOT Vmax:      71.10 cm/s  AORTA Ao Root diam: 3.10 cm MITRAL VALVE               TRICUSPID VALVE MV Area (PHT): 3.27 cm    TR Peak grad:   17.0 mmHg MV Decel Time: 232 msec    TR Vmax:        206.00 cm/s MV E velocity: 95.50 cm/s                            SHUNTS                            Systemic Diam: 2.00 cm Nelva Bush MD Electronically signed by Nelva Bush MD Signature Date/Time: 06/04/2020/12:47:25 PM    Final        Subjective: Patient seen and examined the bedside this morning.  Medically stable for discharge.  Discharge Exam: Vitals:   06/04/20 0821 06/04/20 1223  BP: (!) 148/97 113/70  Pulse: 82 64  Resp: 19 17  Temp: 97.8 F (36.6 C) (!) 97.5 F (36.4 C)  SpO2: 98% 95%   Vitals:   06/04/20 0023 06/04/20 0333 06/04/20 0821 06/04/20 1223  BP:  (!) 145/98 (!) 148/97 113/70   Pulse:  82 82 64  Resp:  17 19 17   Temp:  (!) 97.5 F (36.4 C) 97.8 F (36.6 C) (!) 97.5 F (36.4 C)  TempSrc:  Oral Oral Oral  SpO2:  98% 98% 95%  Weight: 80.1 kg     Height: 5\' 7"  (1.702 m)       General: Pt is alert, awake, not in acute distress Cardiovascular: RRR, S1/S2 +, no rubs, no gallops Respiratory: CTA bilaterally, no wheezing, no rhonchi Abdominal: Soft, NT, ND, bowel sounds + Extremities: trace bilateral lower extremity edema, no cyanosis    The results of significant diagnostics from this hospitalization (including imaging, microbiology, ancillary and laboratory) are listed below for reference.     Microbiology: Recent Results (from the past 240 hour(s))  Resp Panel by RT-PCR (Flu A&B, Covid) Nasopharyngeal Swab     Status: None   Collection Time: 06/01/20  3:33 PM   Specimen: Nasopharyngeal Swab; Nasopharyngeal(NP) swabs in vial transport medium  Result Value Ref Range Status   SARS Coronavirus 2 by RT PCR NEGATIVE NEGATIVE Final    Comment: (NOTE) SARS-CoV-2 target nucleic acids are NOT DETECTED.  The SARS-CoV-2 RNA is generally detectable in upper respiratory specimens during the acute phase of infection. The lowest concentration of SARS-CoV-2 viral copies this assay can detect is 138 copies/mL. A negative result does not preclude SARS-Cov-2 infection and should not be used as the sole basis for treatment or other patient management decisions. A negative result may occur with  improper specimen collection/handling, submission of specimen other than nasopharyngeal swab, presence of viral mutation(s) within the areas targeted by this assay, and inadequate number of viral copies(<138 copies/mL). A negative result must be combined with clinical observations, patient history, and epidemiological information. The expected result is Negative.  Fact Sheet for Patients:  EntrepreneurPulse.com.au  Fact Sheet for Healthcare Providers:   IncredibleEmployment.be  This test is no t yet approved or cleared by the [place] and  has been authorized for detection and/or diagnosis of SARS-CoV-2 by FDA under an Emergency Use Authorization (EUA). This EUA will remain  in effect (meaning this test can be used) for the duration of the COVID-19 declaration under Section 564(b)(1) of the Act, 21 U.S.C.section 360bbb-3(b)(1), unless the authorization is terminated  or revoked sooner.       Influenza A by PCR NEGATIVE NEGATIVE Final   Influenza B by PCR NEGATIVE NEGATIVE Final    Comment: (NOTE) The Xpert Xpress SARS-CoV-2/FLU/RSV plus assay is intended as an aid in the diagnosis of influenza from Nasopharyngeal swab specimens and should not be used as a sole basis for treatment. Nasal washings and aspirates are unacceptable for Xpert Xpress SARS-CoV-2/FLU/RSV testing.  Fact Sheet for Patients: EntrepreneurPulse.com.au  Fact Sheet for Healthcare Providers: IncredibleEmployment.be  This test is not yet approved or cleared by the [place] FDA and has been authorized for detection and/or diagnosis of SARS-CoV-2 by FDA under an Emergency Use Authorization (EUA). This EUA will remain in effect (meaning this test can be used) for the duration of the COVID-19 declaration under Section 564(b)(1) of the Act, 21 U.S.C. section 360bbb-3(b)(1), unless the authorization is terminated or revoked.  Performed at [facility], [address], [place], [place] [address]   Urine Culture     Status: Abnormal   Collection Time: 06/01/20  6:49 PM   Specimen: Urine, Random  Result Value Ref Range Status   Specimen Description   Final    URINE, RANDOM Performed at [facility], [address] [place], [place] [address]    Special Requests   Final    NONE Performed at [facility], [address] [place], [place] [address]    Culture  MULTIPLE SPECIES PRESENT, SUGGEST RECOLLECTION (A)  Final   Report Status 06/03/2020 FINAL  Final     Labs: BNP (last 3 results) Recent Labs    06/01/20 1534  BNP 017.5*   Basic Metabolic Panel: Recent Labs  Lab 06/01/20 1251 06/03/20 0654 06/04/20 0704  NA 138 136 139  K 4.4 5.0 4.4  CL 103 107 107  CO2 27 25 24   GLUCOSE 119* 126* 93  BUN 35* 45* 43*  CREATININE 0.83 1.05* 0.99  CALCIUM 8.5* 8.2* 8.5*   Liver Function Tests: Recent Labs  Lab 06/01/20 1251  AST 44*  ALT 32  ALKPHOS 167*  BILITOT 1.2  PROT 6.5  ALBUMIN 2.5*   Recent Labs  Lab 06/01/20 1251  LIPASE 35   No results for input(s): AMMONIA in the last 168 hours. CBC: Recent Labs  Lab 06/01/20 1251 06/03/20 0654 06/04/20 0704  WBC 10.1 10.4 10.6*  HGB 16.0* 12.7 15.1*  HCT 49.1* 38.9 47.4*  MCV 95.7 95.3 99.8  PLT 269 190 156   Cardiac Enzymes: No results for input(s): CKTOTAL, CKMB, CKMBINDEX, TROPONINI in the last 168 hours. BNP: Invalid input(s): POCBNP CBG: No results for input(s): GLUCAP in the last 168 hours. D-Dimer No results for input(s): DDIMER in the last 72 hours. Hgb A1c No results for input(s): HGBA1C in the last 72 hours. Lipid Profile No results for input(s): CHOL, HDL, LDLCALC, TRIG, CHOLHDL, LDLDIRECT in the last 72 hours. Thyroid function studies No results for input(s): TSH, T4TOTAL, T3FREE, THYROIDAB in the last 72 hours.  Invalid input(s): FREET3 Anemia work up No results for input(s): VITAMINB12, FOLATE, FERRITIN, TIBC, IRON, RETICCTPCT in the last 72 hours. Urinalysis    Component Value Date/Time   COLORURINE AMBER (A)  06/01/2020 1251   APPEARANCEUR CLOUDY (A) 06/01/2020 1251   APPEARANCEUR Cloudy (A) 11/28/2014 1456   LABSPEC 1.034 (H) 06/01/2020 1251   PHURINE 5.0 06/01/2020 1251   GLUCOSEU NEGATIVE 06/01/2020 1251   HGBUR LARGE (A) 06/01/2020 1251   BILIRUBINUR NEGATIVE 06/01/2020 1251   BILIRUBINUR Negative 11/28/2014 1456   KETONESUR NEGATIVE  06/01/2020 1251   PROTEINUR >=300 (A) 06/01/2020 1251   NITRITE NEGATIVE 06/01/2020 1251   LEUKOCYTESUR NEGATIVE 06/01/2020 1251   Sepsis Labs Invalid input(s): PROCALCITONIN,  WBC,  LACTICIDVEN Microbiology Recent Results (from the past 240 hour(s))  Resp Panel by RT-PCR (Flu A&B, Covid) Nasopharyngeal Swab     Status: None   Collection Time: 06/01/20  3:33 PM   Specimen: Nasopharyngeal Swab; Nasopharyngeal(NP) swabs in vial transport medium  Result Value Ref Range Status   SARS Coronavirus 2 by RT PCR NEGATIVE NEGATIVE Final    Comment: (NOTE) SARS-CoV-2 target nucleic acids are NOT DETECTED.  The SARS-CoV-2 RNA is generally detectable in upper respiratory specimens during the acute phase of infection. The lowest concentration of SARS-CoV-2 viral copies this assay can detect is 138 copies/mL. A negative result does not preclude SARS-Cov-2 infection and should not be used as the sole basis for treatment or other patient management decisions. A negative result may occur with  improper specimen collection/handling, submission of specimen other than nasopharyngeal swab, presence of viral mutation(s) within the areas targeted by this assay, and inadequate number of viral copies(<138 copies/mL). A negative result must be combined with clinical observations, patient history, and epidemiological information. The expected result is Negative.  Fact Sheet for Patients:  EntrepreneurPulse.com.au  Fact Sheet for Healthcare Providers:  IncredibleEmployment.be  This test is no t yet approved or cleared by the [place] FDA and  has been authorized for detection and/or diagnosis of SARS-CoV-2 by FDA under an Emergency Use Authorization (EUA). This EUA will remain  in effect (meaning this test can be used) for the duration of the COVID-19 declaration under Section 564(b)(1) of the Act, 21 U.S.C.section 360bbb-3(b)(1), unless the authorization is  terminated  or revoked sooner.       Influenza A by PCR NEGATIVE NEGATIVE Final   Influenza B by PCR NEGATIVE NEGATIVE Final    Comment: (NOTE) The Xpert Xpress SARS-CoV-2/FLU/RSV plus assay is intended as an aid in the diagnosis of influenza from Nasopharyngeal swab specimens and should not be used as a sole basis for treatment. Nasal washings and aspirates are unacceptable for Xpert Xpress SARS-CoV-2/FLU/RSV testing.  Fact Sheet for Patients: EntrepreneurPulse.com.au  Fact Sheet for Healthcare Providers: IncredibleEmployment.be  This test is not yet approved or cleared by the [place] FDA and has been authorized for detection and/or diagnosis of SARS-CoV-2 by FDA under an Emergency Use Authorization (EUA). This EUA will remain in effect (meaning this test can be used) for the duration of the COVID-19 declaration under Section 564(b)(1) of the Act, 21 U.S.C. section 360bbb-3(b)(1), unless the authorization is terminated or revoked.  Performed at [facility], [address], [place], [place] [address]   Urine Culture     Status: Abnormal   Collection Time: 06/01/20  6:49 PM   Specimen: Urine, Random  Result Value Ref Range Status   Specimen Description   Final    URINE, RANDOM Performed at [facility], [address] [place], [place] [address]    Special Requests   Final    NONE Performed at [facility], [address] [place], [place] [address]  Culture MULTIPLE SPECIES PRESENT, SUGGEST RECOLLECTION (A)  Final   Report Status 06/03/2020 FINAL  Final    Please note: You were cared for by a hospitalist during your hospital stay. Once you are discharged, your primary care physician will handle any further medical issues. Please note that NO REFILLS for any discharge medications will be authorized once you are discharged, as it is imperative that you return to your primary care physician (or  establish a relationship with a primary care physician if you do not have one) for your post hospital discharge needs so that they can reassess your need for medications and monitor your lab values.    Time coordinating discharge: 40 minutes  SIGNED:   Shelly Coss, MD  [facility] 06/04/2020, 1:07 PM Pager [phone]  If 7PM-7AM, please contact night-coverage [email or web address] Password [email or web address]

## 2020-06-04 NOTE — Progress Notes (Signed)
Patient complains of 10/10 chest pain this morning, MD notified, adult emergency standing orders initiated per protocol, 12 lead EKG obtained, MD at bedside SL nitro given x3 with resolution of chest pain. STAT troponin ordered, awaiting results, patient otherwise inquiring as to whether or not she can have her coffee.

## 2020-06-04 NOTE — Evaluation (Addendum)
Physical Therapy Evaluation Patient Details Name: [name] MRN: [id number] DOB: 1936-05-26 Today's Date: 06/04/2020   History of Present Illness  Pt is an 85 y.o. female presenting to hospital 1/1 with injured ankle tripping 3 weeks ago; intermittent chest pain, SOB with exertion, HA, nausea, intermittent constipation, and intermittent abdominal pain.  Pt admitted with acute B PE's, possible early L LE cellulitis, and UTI.  PMH includes tobacco abuse, breast CA s/p chemo/radiation/ R lumpectomy, and skin CA.  Clinical Impression  Prior to hospital admission, pt was independent with functional mobility; lives with her ex-husband in 1 level home with 1 STE.  Currently pt is modified independent with bed mobility; independent with transfers; and CGA to SBA ambulating 200 feet (no AD).  Overall pt steady with ambulation; mild SOB noted with increased distance ambulating; O2 sats 93% or greater on room air during sessions activities.  Generalized weakness noted (pt reporting h/o falls).  Pt would benefit from skilled PT to address noted impairments and functional limitations (see below for any additional details).  Upon hospital discharge, pt would benefit from [place].    Follow Up Recommendations Home health PT    Equipment Recommendations  None recommended by PT    Recommendations for Other Services       Precautions / Restrictions Precautions Precautions: Fall Restrictions Weight Bearing Restrictions: No      Mobility  Bed Mobility Overal bed mobility: Modified Independent             General bed mobility comments: Semi-supine to sitting edge of bed with mild increased effort    Transfers Overall transfer level: Independent Equipment used: None             General transfer comment: steady safe transfers noted  Ambulation/Gait Ambulation/Gait assistance: Min Gaffer (Feet): 200 Feet Assistive device: None Gait Pattern/deviations:  Step-through pattern Gait velocity: mildly decreased   General Gait Details: steady ambulation  Stairs            Wheelchair Mobility    Modified Rankin (Stroke Patients Only)       Balance Overall balance assessment: Needs assistance Sitting-balance support: No upper extremity supported;Feet supported Sitting balance-Leahy Scale: Normal Sitting balance - Comments: steady sitting reaching outside BOS   Standing balance support: No upper extremity supported;During functional activity Standing balance-Leahy Scale: Good Standing balance comment: no loss of balance with ambulation                             Pertinent Vitals/Pain Pain Assessment: No/denies pain Pain Intervention(s): Limited activity within patient's tolerance;Monitored during session  HR WFL during sessions activities.    Home Living Family/patient expects to be discharged to:: Private residence Living Arrangements: Other (Comment) (Ex-husband) Available Help at Discharge: Family Type of Home: House Home Access: Stairs to enter   [employer] of Steps: 1 with B posts Home Layout: One level   Additional Comments: pt reports having walker at home but not sure what kind    Prior Function Level of [place]: Independent         Comments: H/o falls (last fall around Dec 1st).     Hand Dominance        Extremity/Trunk Assessment   Upper Extremity Assessment Upper Extremity Assessment: Generalized weakness    Lower Extremity Assessment Lower Extremity Assessment: Generalized weakness    Cervical / Trunk Assessment Cervical / Trunk Assessment: Normal  Communication   Communication: No  difficulties  Cognition Arousal/Alertness: Awake/alert Behavior During Therapy: WFL for tasks assessed/performed Overall Cognitive Status: Within Functional Limits for tasks assessed                                        General Comments General comments (skin  integrity, edema, etc.): B lower leg/ankle/foot swelling noted.  Nursing cleared pt for participation in physical therapy.  Pt agreeable to PT session.    Exercises  Pt educated on pacing and activity modification at home for energy conservation strategies d/t generalized weakness: pt verbalizing appropriate understanding.   Assessment/Plan    PT Assessment Patient needs continued PT services  PT Problem List Decreased strength;Decreased activity tolerance;Decreased balance;Decreased mobility;Cardiopulmonary status limiting activity       PT Treatment Interventions DME instruction;Gait training;Stair training;Functional mobility training;Therapeutic activities;Therapeutic exercise;Balance training;Patient/family education    PT Goals (Current goals can be found in the Care Plan section)  Acute Rehab PT Goals Patient Stated Goal: to go home PT Goal Formulation: With patient Time For Goal Achievement: 06/18/20 Potential to Achieve Goals: Good    Frequency Min 2X/week   Barriers to discharge        Co-evaluation               AM-PAC PT "6 Clicks" Mobility  Outcome Measure Help needed turning from your back to your side while in a flat bed without using bedrails?: None Help needed moving from lying on your back to sitting on the side of a flat bed without using bedrails?: None Help needed moving to and from a bed to a chair (including a wheelchair)?: None Help needed standing up from a chair using your arms (e.g., wheelchair or bedside chair)?: None Help needed to walk in hospital room?: A Little Help needed climbing 3-5 steps with a railing? : A Little 6 Click Score: 22    End of Session Equipment Utilized During Treatment: Gait belt Activity Tolerance: Patient tolerated treatment well Patient left: in chair;with call bell/phone within reach;with chair alarm set Nurse Communication: Mobility status;Precautions PT Visit Diagnosis: Other abnormalities of gait and mobility  (R26.89);Muscle weakness (generalized) (M62.81);History of falling (Z91.81)    Time: 1113-1140 PT Time Calculation (min) (ACUTE ONLY): 27 min   Charges:   PT Evaluation $PT Eval Low Complexity: 1 Low PT Treatments $Therapeutic Exercise: 8-22 mins       Leitha Bleak, PT 06/04/20, 12:18 PM   Addendum:  MD cleared pt for participation in physical therapy (pt had reported chest pain this morning).

## 2020-06-04 NOTE — Plan of Care (Addendum)
Vitals stable. Safety measures in place. Will continue to monitor.  Problem: Education: Goal: Knowledge of General Education information will improve Description: Including pain rating scale, medication(s)/side effects and non-pharmacologic comfort measures Outcome: Progressing   Problem: Health Behavior/Discharge Planning: Goal: Ability to manage health-related needs will improve Outcome: Progressing   Problem: Clinical Measurements: Goal: Ability to maintain clinical measurements within normal limits will improve Outcome: Progressing Goal: Will remain free from infection Outcome: Progressing Goal: Diagnostic test results will improve Outcome: Progressing Goal: Respiratory complications will improve Outcome: Progressing Goal: Cardiovascular complication will be avoided Outcome: Progressing   Problem: Activity: Goal: Risk for activity intolerance will decrease Outcome: Progressing   Problem: Nutrition: Goal: Adequate nutrition will be maintained Outcome: Progressing   Problem: Coping: Goal: Level of anxiety will decrease Outcome: Progressing   Problem: Elimination: Goal: Will not experience complications related to bowel motility Outcome: Progressing Goal: Will not experience complications related to urinary retention Outcome: Progressing   Problem: Safety: Goal: Ability to remain free from injury will improve Outcome: Progressing   Problem: Skin Integrity: Goal: Risk for impaired skin integrity will decrease Outcome: Progressing

## 2020-06-07 DIAGNOSIS — R0602 Shortness of breath: Secondary | ICD-10-CM | POA: Insufficient documentation

## 2020-06-07 DIAGNOSIS — E782 Mixed hyperlipidemia: Secondary | ICD-10-CM | POA: Insufficient documentation

## 2020-06-07 DIAGNOSIS — R600 Localized edema: Secondary | ICD-10-CM | POA: Insufficient documentation

## 2020-06-12 ENCOUNTER — Encounter: Payer: Self-pay | Admitting: Emergency Medicine

## 2020-06-12 ENCOUNTER — Other Ambulatory Visit: Payer: Self-pay

## 2020-06-12 ENCOUNTER — Emergency Department: Payer: Medicare Other

## 2020-06-12 ENCOUNTER — Inpatient Hospital Stay
Admission: EM | Admit: 2020-06-12 | Discharge: 2020-06-15 | DRG: 871 | Disposition: A | Discharge status: Home Health | Payer: Medicare Other | Attending: Internal Medicine | Admitting: Internal Medicine

## 2020-06-12 DIAGNOSIS — F17210 Nicotine dependence, cigarettes, uncomplicated: Secondary | ICD-10-CM | POA: Diagnosis present

## 2020-06-12 DIAGNOSIS — Z9842 Cataract extraction status, left eye: Secondary | ICD-10-CM

## 2020-06-12 DIAGNOSIS — R1084 Generalized abdominal pain: Secondary | ICD-10-CM

## 2020-06-12 DIAGNOSIS — J90 Pleural effusion, not elsewhere classified: Secondary | ICD-10-CM | POA: Diagnosis not present

## 2020-06-12 DIAGNOSIS — K5730 Diverticulosis of large intestine without perforation or abscess without bleeding: Secondary | ICD-10-CM | POA: Diagnosis present

## 2020-06-12 DIAGNOSIS — Z20822 Contact with and (suspected) exposure to covid-19: Secondary | ICD-10-CM | POA: Diagnosis present

## 2020-06-12 DIAGNOSIS — R197 Diarrhea, unspecified: Secondary | ICD-10-CM

## 2020-06-12 DIAGNOSIS — R000 Tachycardia, unspecified: Secondary | ICD-10-CM

## 2020-06-12 DIAGNOSIS — R6521 Severe sepsis with septic shock: Secondary | ICD-10-CM | POA: Diagnosis present

## 2020-06-12 DIAGNOSIS — Z853 Personal history of malignant neoplasm of breast: Secondary | ICD-10-CM

## 2020-06-12 DIAGNOSIS — I10 Essential (primary) hypertension: Secondary | ICD-10-CM | POA: Diagnosis present

## 2020-06-12 DIAGNOSIS — K219 Gastro-esophageal reflux disease without esophagitis: Secondary | ICD-10-CM | POA: Diagnosis present

## 2020-06-12 DIAGNOSIS — Z85828 Personal history of other malignant neoplasm of skin: Secondary | ICD-10-CM

## 2020-06-12 DIAGNOSIS — Z7901 Long term (current) use of anticoagulants: Secondary | ICD-10-CM

## 2020-06-12 DIAGNOSIS — Z961 Presence of intraocular lens: Secondary | ICD-10-CM | POA: Diagnosis present

## 2020-06-12 DIAGNOSIS — N200 Calculus of kidney: Secondary | ICD-10-CM | POA: Diagnosis present

## 2020-06-12 DIAGNOSIS — A419 Sepsis, unspecified organism: Secondary | ICD-10-CM | POA: Diagnosis not present

## 2020-06-12 DIAGNOSIS — R188 Other ascites: Secondary | ICD-10-CM

## 2020-06-12 DIAGNOSIS — K7460 Unspecified cirrhosis of liver: Secondary | ICD-10-CM | POA: Diagnosis present

## 2020-06-12 DIAGNOSIS — N179 Acute kidney failure, unspecified: Secondary | ICD-10-CM | POA: Diagnosis present

## 2020-06-12 DIAGNOSIS — D72829 Elevated white blood cell count, unspecified: Secondary | ICD-10-CM

## 2020-06-12 DIAGNOSIS — E872 Acidosis, unspecified: Secondary | ICD-10-CM

## 2020-06-12 DIAGNOSIS — J189 Pneumonia, unspecified organism: Secondary | ICD-10-CM | POA: Diagnosis present

## 2020-06-12 DIAGNOSIS — Z79899 Other long term (current) drug therapy: Secondary | ICD-10-CM

## 2020-06-12 DIAGNOSIS — Z9221 Personal history of antineoplastic chemotherapy: Secondary | ICD-10-CM

## 2020-06-12 DIAGNOSIS — Z888 Allergy status to other drugs, medicaments and biological substances status: Secondary | ICD-10-CM

## 2020-06-12 DIAGNOSIS — Z9049 Acquired absence of other specified parts of digestive tract: Secondary | ICD-10-CM

## 2020-06-12 DIAGNOSIS — Z923 Personal history of irradiation: Secondary | ICD-10-CM

## 2020-06-12 DIAGNOSIS — Z86711 Personal history of pulmonary embolism: Secondary | ICD-10-CM

## 2020-06-12 DIAGNOSIS — Z9841 Cataract extraction status, right eye: Secondary | ICD-10-CM

## 2020-06-12 DIAGNOSIS — Z91041 Radiographic dye allergy status: Secondary | ICD-10-CM

## 2020-06-12 LAB — PROTIME-INR
INR: 1 (ref 0.8–1.2)
Prothrombin Time: 12.8 seconds (ref 11.4–15.2)

## 2020-06-12 LAB — URINALYSIS, COMPLETE (UACMP) WITH MICROSCOPIC
Bilirubin Urine: NEGATIVE
Glucose, UA: NEGATIVE mg/dL
Ketones, ur: NEGATIVE mg/dL
Leukocytes,Ua: NEGATIVE
Nitrite: NEGATIVE
Protein, ur: >=300 mg/dL — AB
Specific Gravity, Urine: 1.032 — ABNORMAL HIGH (ref 1.005–1.030)
pH: 5 (ref 5.0–8.0)

## 2020-06-12 LAB — CBC WITH DIFFERENTIAL/PLATELET
Abs Immature Granulocytes: 0.33 10*3/uL — ABNORMAL HIGH (ref 0.00–0.07)
Basophils Absolute: 0.2 10*3/uL — ABNORMAL HIGH (ref 0.0–0.1)
Basophils Relative: 1 %
Eosinophils Absolute: 0.2 10*3/uL (ref 0.0–0.5)
Eosinophils Relative: 2 %
HCT: 51.5 % — ABNORMAL HIGH (ref 36.0–46.0)
Hemoglobin: 17 g/dL — ABNORMAL HIGH (ref 12.0–15.0)
Immature Granulocytes: 2 %
Lymphocytes Relative: 10 %
Lymphs Abs: 1.4 10*3/uL (ref 0.7–4.0)
MCH: 31.2 pg (ref 26.0–34.0)
MCHC: 33 g/dL (ref 30.0–36.0)
MCV: 94.5 fL (ref 80.0–100.0)
Monocytes Absolute: 1.2 10*3/uL — ABNORMAL HIGH (ref 0.1–1.0)
Monocytes Relative: 9 %
Neutro Abs: 10.7 10*3/uL — ABNORMAL HIGH (ref 1.7–7.7)
Neutrophils Relative %: 76 %
Platelets: 250 10*3/uL (ref 150–400)
RBC: 5.45 MIL/uL — ABNORMAL HIGH (ref 3.87–5.11)
RDW: 16.1 % — ABNORMAL HIGH (ref 11.5–15.5)
WBC: 14 10*3/uL — ABNORMAL HIGH (ref 4.0–10.5)
nRBC: 0 % (ref 0.0–0.2)

## 2020-06-12 LAB — COMPREHENSIVE METABOLIC PANEL
ALT: 58 U/L — ABNORMAL HIGH (ref 0–44)
AST: 70 U/L — ABNORMAL HIGH (ref 15–41)
Albumin: 2.2 g/dL — ABNORMAL LOW (ref 3.5–5.0)
Alkaline Phosphatase: 150 U/L — ABNORMAL HIGH (ref 38–126)
Anion gap: 12 (ref 5–15)
BUN: 40 mg/dL — ABNORMAL HIGH (ref 8–23)
CO2: 23 mmol/L (ref 22–32)
Calcium: 8.1 mg/dL — ABNORMAL LOW (ref 8.9–10.3)
Chloride: 106 mmol/L (ref 98–111)
Creatinine, Ser: 1.04 mg/dL — ABNORMAL HIGH (ref 0.44–1.00)
GFR, Estimated: 54 mL/min — ABNORMAL LOW (ref >60)
Glucose, Bld: 120 mg/dL — ABNORMAL HIGH (ref 70–99)
Potassium: 4.1 mmol/L (ref 3.5–5.1)
Sodium: 141 mmol/L (ref 135–145)
Total Bilirubin: 1.2 mg/dL (ref 0.3–1.2)
Total Protein: 5.8 g/dL — ABNORMAL LOW (ref 6.5–8.1)

## 2020-06-12 LAB — RESP PANEL BY RT-PCR (FLU A&B, COVID) ARPGX2
Influenza A by PCR: NEGATIVE
Influenza B by PCR: NEGATIVE
SARS Coronavirus 2 by RT PCR: NEGATIVE

## 2020-06-12 LAB — CK: Total CK: 27 U/L — ABNORMAL LOW (ref 38–234)

## 2020-06-12 LAB — APTT: aPTT: <24 seconds — ABNORMAL LOW (ref 24–36)

## 2020-06-12 LAB — TROPONIN I (HIGH SENSITIVITY): Troponin I (High Sensitivity): 9 ng/L (ref <18)

## 2020-06-12 LAB — LACTIC ACID, PLASMA: Lactic Acid, Venous: 3.6 mmol/L (ref 0.5–1.9)

## 2020-06-12 LAB — LIPASE, BLOOD: Lipase: 38 U/L (ref 11–51)

## 2020-06-12 MED ORDER — ACETAMINOPHEN 650 MG RE SUPP: 325.0000 mg | Freq: Four times a day (QID) | RECTAL | Status: DC | PRN

## 2020-06-12 MED ORDER — LACTATED RINGERS IV BOLUS
1000.0000 mL | Freq: Once | INTRAVENOUS | Status: AC
  Administered 2020-06-12: 1000 mL via INTRAVENOUS

## 2020-06-12 MED ORDER — SODIUM CHLORIDE 0.9 % IV SOLN
500.0000 mg | INTRAVENOUS | Status: DC
  Administered 2020-06-13 – 2020-06-15 (×3): 500 mg via INTRAVENOUS
  Filled 2020-06-12 (×4): qty 500

## 2020-06-12 MED ORDER — ONDANSETRON HCL 4 MG PO TABS
4.0000 mg | ORAL_TABLET | Freq: Once | ORAL | Status: AC
  Administered 2020-06-12: 4 mg via ORAL
  Filled 2020-06-12: qty 1

## 2020-06-12 MED ORDER — ALBUTEROL SULFATE HFA 108 (90 BASE) MCG/ACT IN AERS
2.0000 | INHALATION_SPRAY | Freq: Four times a day (QID) | RESPIRATORY_TRACT | Status: DC | PRN
  Filled 2020-06-12: qty 6.7

## 2020-06-12 MED ORDER — SODIUM CHLORIDE 0.9 % IV SOLN: 500.0000 mg | INTRAVENOUS | Status: DC

## 2020-06-12 MED ORDER — APIXABAN 5 MG PO TABS
5.0000 mg | ORAL_TABLET | Freq: Two times a day (BID) | ORAL | Status: DC
  Administered 2020-06-12 – 2020-06-15 (×5): 5 mg via ORAL
  Filled 2020-06-12 (×5): qty 1

## 2020-06-12 MED ORDER — SODIUM CHLORIDE 0.9 % IV SOLN: 2.0000 g | INTRAVENOUS | Status: DC

## 2020-06-12 MED ORDER — LACTATED RINGERS IV SOLN: INTRAVENOUS | Status: AC

## 2020-06-12 MED ORDER — ONDANSETRON HCL 4 MG/2ML IJ SOLN
4.0000 mg | Freq: Four times a day (QID) | INTRAMUSCULAR | Status: DC | PRN
  Administered 2020-06-13: 4 mg via INTRAVENOUS
  Filled 2020-06-12: qty 2

## 2020-06-12 MED ORDER — PANTOPRAZOLE SODIUM 40 MG PO TBEC
40.0000 mg | DELAYED_RELEASE_TABLET | Freq: Every day | ORAL | Status: DC
Start: 2020-06-12 — End: 2020-06-15
  Administered 2020-06-12 – 2020-06-15 (×4): 40 mg via ORAL
  Filled 2020-06-12 (×4): qty 1

## 2020-06-12 MED ORDER — ONDANSETRON HCL 4 MG PO TABS: 4.0000 mg | ORAL_TABLET | Freq: Four times a day (QID) | ORAL | Status: DC | PRN

## 2020-06-12 MED ORDER — FUROSEMIDE 20 MG PO TABS
20.0000 mg | ORAL_TABLET | Freq: Every day | ORAL | Status: DC
  Administered 2020-06-12 – 2020-06-15 (×4): 20 mg via ORAL
  Filled 2020-06-12 (×4): qty 1

## 2020-06-12 MED ORDER — SODIUM CHLORIDE 0.9 % IV SOLN
1.0000 g | Freq: Once | INTRAVENOUS | Status: AC
  Administered 2020-06-12: 1 g via INTRAVENOUS
  Filled 2020-06-12: qty 10

## 2020-06-12 MED ORDER — SODIUM CHLORIDE 0.9 % IV SOLN
2.0000 g | INTRAVENOUS | Status: DC
  Administered 2020-06-13 – 2020-06-15 (×3): 2 g via INTRAVENOUS
  Filled 2020-06-12: qty 2
  Filled 2020-06-12: qty 20
  Filled 2020-06-12: qty 2
  Filled 2020-06-12: qty 20

## 2020-06-12 MED ORDER — METOPROLOL TARTRATE 25 MG PO TABS
12.5000 mg | ORAL_TABLET | Freq: Two times a day (BID) | ORAL | Status: DC
  Administered 2020-06-12 – 2020-06-15 (×6): 12.5 mg via ORAL
  Filled 2020-06-12 (×5): qty 1

## 2020-06-12 MED ORDER — ONDANSETRON HCL 4 MG/2ML IJ SOLN: 4.0000 mg | Freq: Once | INTRAMUSCULAR | Status: DC

## 2020-06-12 MED ORDER — SODIUM CHLORIDE 0.9 % IV SOLN
500.0000 mg | Freq: Once | INTRAVENOUS | Status: AC
  Administered 2020-06-12: 500 mg via INTRAVENOUS
  Filled 2020-06-12: qty 500

## 2020-06-12 MED ORDER — ADULT MULTIVITAMIN W/MINERALS CH
1.0000 | ORAL_TABLET | Freq: Every day | ORAL | Status: DC
  Administered 2020-06-12 – 2020-06-15 (×4): 1 via ORAL
  Filled 2020-06-12 (×4): qty 1

## 2020-06-12 MED ORDER — ACETAMINOPHEN 325 MG PO TABS: 325.0000 mg | ORAL_TABLET | Freq: Four times a day (QID) | ORAL | Status: DC | PRN

## 2020-06-12 NOTE — ED Notes (Signed)
Medication Reconciliation Report  For Home History Technicians  HIGHLIGHTS:  1. The patient WAS personally interviewed 2. If not, what was the main source used: NOT APPLICABLE 3. Does the patient appear to take any anti-coagulation agents (e.g. warfarin, Eliquis or Xarelto): YES 4. Does the patient appear to take any anti-convulsant agents (e.g. divalproex, levetiracetam or phenytoin): NO 5. Does the patient appear to use any insulin products (e.g. Lantus, Novolin or Humalog): NO 6. Does the patient appear to take any "beta-blockers" (e.g. metoprolol, carvedilol or bisoprolol: YES  BARRIERS:  1. Were there any barriers that prevented or complicated the medication reconciliation process: NO 2. If yes, what was the primary barrier encountered: None 3. Does the patient appear compliant with prescribed medications: UNABLE TO DETERMINE 4. Does the patient express any barriers with compliance: NO 5. What is the primary barrier the patient reports: None   NOTES:[Include any concerns, remarks or complaints the patient expresses regarding medication therapy. Any observations or other information that might be useful to the treatment team can also be included. Immediate needs or concerns should be referred to the RN or appropriate member of the treatment team.]  Patient presented home medications. Patient was prescribed APIXABAN upon discharge from [facility] (06/04/2020). Patient was prescribed 24 tablets with instructions to take 2 tablets (10mg ) by mouth twice daily for six days. Pill bottle has 11 or 12 tablets remaining. Patient also did not take any of the PREDNISONE prescribed 06/04/2020.  Colen Darling, CPhT [place] [facility]. [place], [place] [address] [phone]  ** The above is intended solely for informational and/or communicative purposes. It should in no way be considered an endorsement of any specific treatment, therapy or action. **

## 2020-06-12 NOTE — ED Notes (Signed)
Pt unable to void 

## 2020-06-12 NOTE — ED Notes (Signed)
Pt taken to the restroom for urine sample , via ED techs

## 2020-06-12 NOTE — ED Notes (Signed)
Message MD Cox,  Was able to get an ultrasound IV , however IV will not draw back blood for Blood Cultures , Lab has attempted , and waiting on IV team for another IV. Will start Antibiotic without cultures

## 2020-06-12 NOTE — ED Notes (Signed)
IV team consult ordered ,   Attempted 2 times

## 2020-06-12 NOTE — ED Notes (Signed)
IV advised that we still needed an IV

## 2020-06-12 NOTE — ED Notes (Signed)
Date and time results received: 06/12/20 2:12pm (use smartphrase ".now" to insert current time)  Test: Lactic Acid 3.6 Critical Value:3.6  Name of Provider Notified: MD Tamala Julian

## 2020-06-12 NOTE — ED Notes (Signed)
MD cox at bedside

## 2020-06-12 NOTE — H&P (Signed)
History and Physical   [name] MOQ:[id number] DOB: 22-Oct-1935 DOA: 06/12/2020  PCP: Sallee Lange, NP  Patient coming from: Home  I have personally briefly reviewed patient's old medical records in [place].  Chief Concern: Abdominal pain and weakness.  HPI: [name] is a 85 y.o. female with medical history significant for hypertension, breast cancer status post right lumpectomy currently in remission, atrial fibrillation, GERD, presented to the emergency department for chief concerns of acute weakness.  Patient reports that she felt generalized weakness for the last 2 days.  She endorses profound abdominal pain and thought that she needed to use the restroom. She was on her way to the restroom and after having a bowel movement in the restroom, she felt profound weakness and had difficulty standing up.  She pounded on the door so that her roommate can assist her and called EMS.  She reports that her roommate called EMS and she took a very long time to get herself dressed before waiting for EMS to arrive.  She states that she has never felt this profound weakness before.  She denies fever at home.  She reports that she feels a little better in the emergency department with the treatments given.  She reports she was recently admitted to the hospital for UTI.  On chart review patient had bilateral acute PE.  She was started on heparin gtt. and discharged home with Eliquis.  ROS: Constitutional: no weight change, no fever ENT/Mouth: no sore throat, no rhinorrhea Eyes: no eye pain, no vision changes Cardiovascular: no chest pain, no dyspnea,  no edema, no palpitations Respiratory: + cough, no sputum, no wheezing Gastrointestinal: no nausea, no vomiting, no diarrhea, no constipation Genitourinary: no urinary incontinence, no dysuria, no hematuria Musculoskeletal: no arthralgias, no myalgias Skin: no skin lesions, no pruritus, Neuro: + weakness, no loss of  consciousness, no syncope Psych: no anxiety, no depression, + decrease appetite Heme/Lymph: no bruising, no bleeding  ED Course: Discussed with ED provider, patient requires hospitalization due to possible sepsis.  ED provider ordered CT abdomen and pelvis without contrast and was read as liver compatible with cirrhosis.  Stigmata of portal venous hypertension and is noted including varices and ascites.  There has been increased interval volume ascites within the abdomen and pelvis.  She endorses new small bilateral pleural effusions.  CT also read nonobstructing left renal calculus.  Assessment/Plan  Active Problems:   Sepsis ([place])   Sepsis cannot be excluded Pneumonia - IV ceftriaxone and azithromycin IV - Blood cultures x2 -C. difficile quick screen with PCR ordered - paracentesis via ultrasound ordered by ED provider -UA was negative  History of bilateral acute PE- Eliquis 5 mg twice daily p.o. resumed  Hypertension- resumed home metoprolol tartrate 12.5 mg twice daily, Lasix 20 mg daily  Chart reviewed.    Inpatient hospitalization from 06/01/2020 to 06/04/2020: Patient presented for shortness of breath, dry cough, right-sided chest pain and bilateral lower extremity edema and pain.  She was found to have PE bilaterally.  She was started on heparin gtt. and transition to Eliquis.  She was treated for possible left lower extremity cellulitis with ceftriaxone IV.  She was also treated for possible COPD exacerbation and got prednisone for 5 days.  She was also treated for possible UTI with ABX.  DVT prophylaxis: On Eliquis 5 mg twice daily Code Status: Full code Diet: Heart healthy Family Communication: No Disposition Plan: Pending clinical course Consults called: None at this time Admission  status: Observation with telemetry  Past Medical History:  Diagnosis Date  . Arthritis    lower back, knees  . Breast cancer ([place]) 2011   RT LUMPECTOMY  . Chemotherapy follow-up  examination 2011   RT BREAST CANCER  . Cirrhosis ([place])   . Personal history of chemotherapy   . Personal history of radiation therapy   . Radiation 2011   RT BREAST CANCER  . Skin cancer   . Wears dentures    full upper, partial lower   Past Surgical History:  Procedure Laterality Date  . BREAST BIOPSY Right 2014   RT CORE W/CLIP - NEG  . BREAST EXCISIONAL BIOPSY Right 2011   breast ca  . CATARACT EXTRACTION W/PHACO Right 04/15/2016   Procedure: CATARACT EXTRACTION PHACO AND INTRAOCULAR LENS PLACEMENT (IOC);  Surgeon: Leandrew Koyanagi, MD;  Location: [facility];  Service: Ophthalmology;  Laterality: Right;  . CATARACT EXTRACTION W/PHACO Left 07/01/2016   Procedure: CATARACT EXTRACTION PHACO AND INTRAOCULAR LENS PLACEMENT (IOC);  Surgeon: Leandrew Koyanagi, MD;  Location: [facility];  Service: Ophthalmology;  Laterality: Left;  Left eye  . CHOLECYSTECTOMY     Social History:  reports that she has been smoking cigarettes. She has been smoking about 0.50 packs per day. She has quit using smokeless tobacco. She reports that she does not drink alcohol and does not use drugs.  Allergies  Allergen Reactions  . Gabapentin Itching  . Ivp Dye [Iodinated Diagnostic Agents] Itching   Family History  Problem Relation Age of Onset  . Cancer Father        prostate  . Cancer Brother        prostate  . Breast cancer Neg Hx    Family history: Family history reviewed and not pertinent  Prior to Admission medications   Medication Sig Start Date End Date Taking? Authorizing Provider  albuterol (VENTOLIN HFA) 108 (90 Base) MCG/ACT inhaler Inhale 2 puffs into the lungs every 6 (six) hours as needed for wheezing or shortness of breath. 06/04/20  Yes Shelly Coss, MD  apixaban (ELIQUIS) 5 MG TABS tablet Take 10 mg by mouth 2 (two) times daily.   Yes [provider]  calcium carbonate (OS-CAL) 1250 (500 Ca) MG chewable tablet Chew 1 tablet by mouth daily.   Yes  [provider]  Cholecalciferol 25 MCG (1000 UT) tablet Take 1,000 Units by mouth daily.   Yes [provider]  furosemide (LASIX) 20 MG tablet Take 1 tablet (20 mg total) by mouth daily. 06/05/20  Yes Shelly Coss, MD  meloxicam (MOBIC) 15 MG tablet Take 15 mg by mouth daily as needed for pain.   Yes [provider]  metoprolol tartrate (LOPRESSOR) 25 MG tablet Take 0.5 tablets (12.5 mg total) by mouth 2 (two) times daily. 06/04/20  Yes Shelly Coss, MD  Multiple Vitamins-Minerals (CENTRUM SILVER 50+WOMEN) TABS Take 1 tablet by mouth daily.   Yes [provider]  pantoprazole (PROTONIX) 40 MG tablet Take 1 tablet (40 mg total) by mouth daily. 06/04/20  Yes Shelly Coss, MD  apixaban (ELIQUIS) 5 MG TABS tablet Take 1 tablet (5 mg total) by mouth 2 (two) times daily. 06/10/20   Shelly Coss, MD   Physical Exam: Vitals:   06/12/20 1149 06/12/20 1930 06/12/20 2000  BP: (!) 138/96 124/85 131/84  Pulse: (!) 104 (!) 103 90  Resp: (!) 22 (!) 29 20  Temp: 97.7 F (36.5 C)    TempSrc: Oral    SpO2: 97% 100%  98%  Weight: 79.4 kg    Height: 5\' 8"  (1.727 m)     Constitutional: appears age appropriate, NAD, calm, comfortable Eyes: PERRL, lids and conjunctivae normal ENMT: Mucous membranes are moist. Posterior pharynx clear of any exudate or lesions. Age-appropriate dentition. Hearing appropriate Neck: normal, supple, no masses, no thyromegaly Respiratory: clear to auscultation bilaterally, no wheezing, no crackles. Normal respiratory effort. No accessory muscle use.  Cardiovascular: Regular rate and rhythm, no murmurs / rubs / gallops. No extremity edema. 2+ pedal pulses. No carotid bruits.  Abdomen: no tenderness, no masses palpated, no hepatosplenomegaly. Bowel sounds positive.  Musculoskeletal: no clubbing / cyanosis. No joint deformity upper and lower extremities. Good ROM, no contractures, no atrophy. Normal muscle tone.  Skin: no rashes, lesions,  ulcers. No induration Neurologic: Sensation intact. Strength 5/5 in all 4.  Psychiatric: Normal judgment and insight. Alert and oriented x 3. Normal mood.   EKG: independently reviewed, showing junctional rhythm with PVCs.  QTc 408  Chest x-ray on Admission: I personally reviewed and I agree with radiologist reading as below.  CT ABDOMEN PELVIS WO CONTRAST  Result Date: 06/12/2020 CLINICAL DATA:  Abdominal pain EXAM: CT ABDOMEN AND PELVIS WITHOUT CONTRAST TECHNIQUE: Multidetector CT imaging of the abdomen and pelvis was performed following the standard protocol without IV contrast. COMPARISON:  06/01/2020 FINDINGS: Lower chest: Small bilateral pleural effusions appear new from previous exam. Cardiac enlargement. Aortic atherosclerosis and coronary artery calcifications identified. Soft tissue lesion with adjacent surgical clips noted in the lateral right breast, image 3/2. Hepatobiliary: The liver is atrophic and cirrhotic with a diffuse nodular contour. Within the limitations of unenhanced technique there is no focal liver abnormality. Previous cholecystectomy. No signs of biliary dilatation. Pancreas: Unremarkable. No pancreatic ductal dilatation or surrounding inflammatory changes. Spleen: Normal in size without focal abnormality. Adrenals/Urinary Tract: Normal appearance of the adrenal glands. Bilateral renal sinus cysts are again noted. Nonobstructing left renal calculi measures 3 mm, image 42/2. Exophytic lesion arising off the inferior pole of right kidney anteriorly is incompletely characterized without IV contrast measuring 1.7 cm, image 48/2. Urinary bladder appears unremarkable. Stomach/Bowel: Stomach is unremarkable. The appendix is visualized and appears normal. No bowel wall thickening, inflammation or distension. Colonic diverticulosis noted. Vascular/Lymphatic: Aortic atherosclerosis. Upper abdominal varices. No abdominopelvic adenopathy. Reproductive: Uterus and bilateral adnexa are  unremarkable. Other: There is been interval increase in volume of abdominal and pelvic ascites when compared with 06/01/2020. No focal fluid collection identified. The calcified lesion within the anterior aspect of the upper abdomen is unchanged measuring 1.8 cm, image 48/2. Favor benign abnormality. Musculoskeletal: Status post solid fusion of L3 through L5. Bone graft harvesting sites identified within the left iliac bone. No acute or suspicious osseous findings. IMPRESSION: 1. Morphologic features of the liver compatible with cirrhosis. Stigmata of portal venous hypertension is noted including varices and ascites. When compared with the previous exam there is been interval increase in volume of ascites within the abdomen and pelvis. 2. New small bilateral pleural effusions. 3. Aortic atherosclerosis and coronary artery calcifications. 4. Nonobstructing left renal calculus. Aortic Atherosclerosis (ICD10-I70.0). Electronically Signed   By: Kerby Moors M.D.   On: 06/12/2020 12:55   DG Chest 1 View  Result Date: 06/12/2020 CLINICAL DATA:  Right upper quadrant pain. EXAM: CHEST  1 VIEW COMPARISON:  10/11/2015 FINDINGS: Patchy opacity at the right base compatible with atelectasis or infiltrate. Streaky/linear density at the left base suggest atelectasis. The cardio pericardial silhouette is enlarged. Bones are diffusely demineralized. IMPRESSION: Bibasilar  atelectasis or infiltrate, right greater than left. Electronically Signed   By: Misty Stanley M.D.   On: 06/12/2020 14:57   Labs on Admission: I have personally reviewed following labs  CBC: Recent Labs  Lab 06/12/20 1323  WBC 14.0*  NEUTROABS 10.7*  HGB 17.0*  HCT 51.5*  MCV 94.5  PLT 572   Basic Metabolic Panel: Recent Labs  Lab 06/12/20 1323  NA 141  K 4.1  CL 106  CO2 23  GLUCOSE 120*  BUN 40*  CREATININE 1.04*  CALCIUM 8.1*   GFR: Estimated Creatinine Clearance: 46.2 mL/min (A) (by C-G formula based on SCr of 1.04 mg/dL  (H)). Liver Function Tests: Recent Labs  Lab 06/12/20 1323  AST 70*  ALT 58*  ALKPHOS 150*  BILITOT 1.2  PROT 5.8*  ALBUMIN 2.2*   Recent Labs  Lab 06/12/20 1323  LIPASE 38   Coagulation Profile: Recent Labs  Lab 06/12/20 1535  INR 1.0   Cardiac Enzymes: Recent Labs  Lab 06/12/20 1323  CKTOTAL 27*   Urine analysis:    Component Value Date/Time   COLORURINE AMBER (A) 06/12/2020 1500   APPEARANCEUR HAZY (A) 06/12/2020 1500   APPEARANCEUR Cloudy (A) 11/28/2014 1456   LABSPEC 1.032 (H) 06/12/2020 1500   PHURINE 5.0 06/12/2020 1500   GLUCOSEU NEGATIVE 06/12/2020 1500   HGBUR LARGE (A) 06/12/2020 1500   BILIRUBINUR NEGATIVE 06/12/2020 1500   BILIRUBINUR Negative 11/28/2014 1456   KETONESUR NEGATIVE 06/12/2020 1500   PROTEINUR >=300 (A) 06/12/2020 1500   NITRITE NEGATIVE 06/12/2020 1500   LEUKOCYTESUR NEGATIVE 06/12/2020 1500   Bransyn Adami N Deontaye Civello D.O. [facility]  If 7PM-7AM, please contact overnight-coverage provider If 7AM-7PM, please contact day coverage provider [email or web address]  06/12/2020, 10:55 PM

## 2020-06-12 NOTE — ED Notes (Signed)
IV team at bedside 

## 2020-06-12 NOTE — ED Notes (Signed)
Lab at bedside

## 2020-06-12 NOTE — ED Provider Notes (Signed)
 [facility] Emergency Department Provider Note  ____________________________________________   Event Date/Time   First MD Initiated Contact with Patient 06/12/20 1142     (approximate)  I have reviewed the triage vital signs and the nursing notes.   HISTORY  Chief Complaint Abdominal Pain   HPI [name] is a 84 y.o. female with history of hypertension, breast cancer status post right lumpectomycurrently in remission,GERD, atrial fibrillation and recent admission 1/1-1/4 for bilateral pulmonary emboli discharged on Eliquis who presents for assessment of some periumbilical and epigastric abdominal pain that has been getting worse over the last couple of days associate with nonbloody diarrhea and some nausea.  Patient denies any shortness of breath or chest pain but does endorse mild chronic cough.  She denies any urinary symptoms, back pain, rash or acute extremity pain.  No recent fevers, headaches, earache, sore throat or chills.  Patient states she is taking all of her medications as directed including her Eliquis.  No significant recent NSAID use or alcohol use.   Past Medical History:  Diagnosis Date  . Arthritis    lower back, knees  . Breast cancer (HCC) 2011   RT LUMPECTOMY  . Chemotherapy follow-up examination 2011   RT BREAST CANCER  . Cirrhosis (HCC)   . Personal history of chemotherapy   . Personal history of radiation therapy   . Radiation 2011   RT BREAST CANCER  . Skin cancer   . Wears dentures    full upper, partial lower    Patient Active Problem List   Diagnosis Date Noted  . Sepsis (HCC) 06/12/2020  . Acute pulmonary embolism (HCC) 06/02/2020  . Breast cancer (HCC) 11/02/2014  . Breast CA (HCC) 01/19/2013    Past Surgical History:  Procedure Laterality Date  . BREAST BIOPSY Right 2014   RT CORE W/CLIP - NEG  . BREAST EXCISIONAL BIOPSY Right 2011   breast ca  . CATARACT EXTRACTION W/PHACO Right 04/15/2016    Procedure: CATARACT EXTRACTION PHACO AND INTRAOCULAR LENS PLACEMENT (IOC);  Surgeon: Chadwick Brasington, MD;  Location: [facility];  Service: Ophthalmology;  Laterality: Right;  . CATARACT EXTRACTION W/PHACO Left 07/01/2016   Procedure: CATARACT EXTRACTION PHACO AND INTRAOCULAR LENS PLACEMENT (IOC);  Surgeon: Chadwick Brasington, MD;  Location: [facility];  Service: Ophthalmology;  Laterality: Left;  Left eye  . CHOLECYSTECTOMY      Prior to Admission medications   Medication Sig Start Date End Date Taking? Authorizing Provider  albuterol (VENTOLIN HFA) 108 (90 Base) MCG/ACT inhaler Inhale 2 puffs into the lungs every 6 (six) hours as needed for wheezing or shortness of breath. 06/04/20   Adhikari, Amrit, MD  apixaban (ELIQUIS) 5 MG TABS tablet Take 2 tablets (10 mg total) by mouth 2 (two) times daily for 6 days. 06/04/20 06/10/20  Adhikari, Amrit, MD  apixaban (ELIQUIS) 5 MG TABS tablet Take 1 tablet (5 mg total) by mouth 2 (two) times daily. 06/10/20   Adhikari, Amrit, MD  calcium carbonate (OS-CAL) 1250 (500 Ca) MG chewable tablet Chew 1 tablet by mouth daily.    [provider]  Cholecalciferol 25 MCG (1000 UT) tablet Take 1 tablet by mouth daily.    [provider]  furosemide (LASIX) 20 MG tablet Take 1 tablet (20 mg total) by mouth daily. 06/05/20   Adhikari, Amrit, MD  meloxicam (MOBIC) 15 MG tablet Take 1 tablet by mouth daily as needed. 02/07/15   [provider]  metoprolol tartrate (LOPRESSOR) 25 MG   tablet Take 0.5 tablets (12.5 mg total) by mouth 2 (two) times daily. 06/04/20   Adhikari, Amrit, MD  Multiple Vitamins-Minerals (CENTRUM SILVER 50+WOMEN) TABS Take 1 tablet by mouth daily.    [provider]  pantoprazole (PROTONIX) 40 MG tablet Take 1 tablet (40 mg total) by mouth daily. 06/04/20   Adhikari, Amrit, MD    Allergies Gabapentin and Ivp dye [iodinated diagnostic agents]  Family History  Problem Relation Age of Onset  . Cancer  Father        prostate  . Cancer Brother        prostate  . Breast cancer Neg Hx     Social History Social History   Tobacco Use  . Smoking status: Current Every Day Smoker    Packs/day: 0.50    Types: Cigarettes    Last attempt to quit: 04/28/2014    Years since quitting: 6.1  . Smokeless tobacco: Former User  Substance Use Topics  . Alcohol use: No    Alcohol/week: 0.0 standard drinks    Comment: occasional glass of wine  . Drug use: No    Review of Systems  Review of Systems  Constitutional: Negative for chills and fever.  HENT: Negative for sore throat.   Eyes: Negative for pain.  Respiratory: Positive for cough ( chronic). Negative for stridor.   Cardiovascular: Negative for chest pain.  Gastrointestinal: Positive for abdominal pain, diarrhea and nausea. Negative for vomiting.  Genitourinary: Negative for dysuria.  Musculoskeletal: Negative for myalgias.  Skin: Negative for rash.  Neurological: Negative for seizures, loss of consciousness and headaches.  Psychiatric/Behavioral: Negative for suicidal ideas.  All other systems reviewed and are negative.     ____________________________________________   PHYSICAL EXAM:  VITAL SIGNS: ED Triage Vitals  Enc Vitals Group     BP      Pulse      Resp      Temp      Temp src      SpO2      Weight      Height      Head Circumference      Peak Flow      Pain Score      Pain Loc      Pain Edu?      Excl. in GC?    Vitals:   06/12/20 1149  BP: (!) 138/96  Pulse: (!) 104  Resp: (!) 22  Temp: 97.7 F (36.5 C)  SpO2: 97%   Physical Exam Vitals and nursing note reviewed.  Constitutional:      General: She is not in acute distress.    Appearance: She is well-developed and well-nourished.  HENT:     Head: Normocephalic and atraumatic.     Right Ear: External ear normal.     Left Ear: External ear normal.     Nose: Nose normal.  Eyes:     Conjunctiva/sclera: Conjunctivae normal.  Cardiovascular:      Rate and Rhythm: Regular rhythm. Tachycardia present.     Heart sounds: No murmur heard.   Pulmonary:     Effort: Pulmonary effort is normal. No respiratory distress.     Breath sounds: Normal breath sounds.  Abdominal:     Palpations: Abdomen is soft.     Tenderness: There is generalized abdominal tenderness. There is no right CVA tenderness or left CVA tenderness.  Musculoskeletal:        General: No swelling, deformity or edema.     Cervical back:   Neck supple.  Skin:    General: Skin is warm and dry.  Neurological:     Mental Status: She is alert and oriented to person, place, and time.  Psychiatric:        Mood and Affect: Mood and affect and mood normal.      ____________________________________________   LABS (all labs ordered are listed, but only abnormal results are displayed)  Labs Reviewed  CBC WITH DIFFERENTIAL/PLATELET - Abnormal; Notable for the following components:      Result Value   WBC 14.0 (*)    RBC 5.45 (*)    Hemoglobin 17.0 (*)    HCT 51.5 (*)    RDW 16.1 (*)    Neutro Abs 10.7 (*)    Monocytes Absolute 1.2 (*)    Basophils Absolute 0.2 (*)    Abs Immature Granulocytes 0.33 (*)    All other components within normal limits  COMPREHENSIVE METABOLIC PANEL - Abnormal; Notable for the following components:   Glucose, Bld 120 (*)    BUN 40 (*)    Creatinine, Ser 1.04 (*)    Calcium 8.1 (*)    Total Protein 5.8 (*)    Albumin 2.2 (*)    AST 70 (*)    ALT 58 (*)    Alkaline Phosphatase 150 (*)    GFR, Estimated 54 (*)    All other components within normal limits  LACTIC ACID, PLASMA - Abnormal; Notable for the following components:   Lactic Acid, Venous 3.6 (*)    All other components within normal limits  URINALYSIS, COMPLETE (UACMP) WITH MICROSCOPIC - Abnormal; Notable for the following components:   Color, Urine AMBER (*)    APPearance HAZY (*)    Specific Gravity, Urine 1.032 (*)    Hgb urine dipstick LARGE (*)    Protein, ur >=300  (*)    Bacteria, UA RARE (*)    All other components within normal limits  RESP PANEL BY RT-PCR (FLU A&B, COVID) ARPGX2  GASTROINTESTINAL PANEL BY PCR, STOOL (REPLACES STOOL CULTURE)  C DIFFICILE QUICK SCREEN W PCR REFLEX  URINE CULTURE  CULTURE, BLOOD (ROUTINE X 2)  CULTURE, BLOOD (ROUTINE X 2)  LIPASE, BLOOD  PROTIME-INR  LACTIC ACID, PLASMA  APTT  PROCALCITONIN  TSH  CK  TROPONIN I (HIGH SENSITIVITY)   ____________________________________________  EKG  A flutter with a ventricular rate of 103, normal axis, normal intervals, no evidence of acute ischemia or other significant underlying arrhythmia. ____________________________________________  RADIOLOGY  ED MD interpretation: Chest x-ray has some small bilateral pleural effusions and small bilateral lower lobe infiltrates slightly worse on the right than the left.  No significant pulmonary edema, pneumothorax, or other acute intrathoracic process.  CT abdomen pelvis remarkable from some ascites with evidence of cirrhosis and portal venous hypertension.  No evidence of perinephric stranding, acute pancreatitis, acute cholecystitis, appendicitis, or other clear acute process.  Patient has a none obstructing left renal.   Official radiology report(s): CT ABDOMEN PELVIS WO CONTRAST  Result Date: 06/12/2020 CLINICAL DATA:  Abdominal pain EXAM: CT ABDOMEN AND PELVIS WITHOUT CONTRAST TECHNIQUE: Multidetector CT imaging of the abdomen and pelvis was performed following the standard protocol without IV contrast. COMPARISON:  06/01/2020 FINDINGS: Lower chest: Small bilateral pleural effusions appear new from previous exam. Cardiac enlargement. Aortic atherosclerosis and coronary artery calcifications identified. Soft tissue lesion with adjacent surgical clips noted in the lateral right breast, image 3/2. Hepatobiliary: The liver is atrophic and cirrhotic with a diffuse nodular contour. Within   the limitations of unenhanced technique there is  no focal liver abnormality. Previous cholecystectomy. No signs of biliary dilatation. Pancreas: Unremarkable. No pancreatic ductal dilatation or surrounding inflammatory changes. Spleen: Normal in size without focal abnormality. Adrenals/Urinary Tract: Normal appearance of the adrenal glands. Bilateral renal sinus cysts are again noted. Nonobstructing left renal calculi measures 3 mm, image 42/2. Exophytic lesion arising off the inferior pole of right kidney anteriorly is incompletely characterized without IV contrast measuring 1.7 cm, image 48/2. Urinary bladder appears unremarkable. Stomach/Bowel: Stomach is unremarkable. The appendix is visualized and appears normal. No bowel wall thickening, inflammation or distension. Colonic diverticulosis noted. Vascular/Lymphatic: Aortic atherosclerosis. Upper abdominal varices. No abdominopelvic adenopathy. Reproductive: Uterus and bilateral adnexa are unremarkable. Other: There is been interval increase in volume of abdominal and pelvic ascites when compared with 06/01/2020. No focal fluid collection identified. The calcified lesion within the anterior aspect of the upper abdomen is unchanged measuring 1.8 cm, image 48/2. Favor benign abnormality. Musculoskeletal: Status post solid fusion of L3 through L5. Bone graft harvesting sites identified within the left iliac bone. No acute or suspicious osseous findings. IMPRESSION: 1. Morphologic features of the liver compatible with cirrhosis. Stigmata of portal venous hypertension is noted including varices and ascites. When compared with the previous exam there is been interval increase in volume of ascites within the abdomen and pelvis. 2. New small bilateral pleural effusions. 3. Aortic atherosclerosis and coronary artery calcifications. 4. Nonobstructing left renal calculus. Aortic Atherosclerosis (ICD10-I70.0). Electronically Signed   By: Taylor  Stroud M.D.   On: 06/12/2020 12:55   DG Chest 1 View  Result Date:  06/12/2020 CLINICAL DATA:  Right upper quadrant pain. EXAM: CHEST  1 VIEW COMPARISON:  10/11/2015 FINDINGS: Patchy opacity at the right base compatible with atelectasis or infiltrate. Streaky/linear density at the left base suggest atelectasis. The cardio pericardial silhouette is enlarged. Bones are diffusely demineralized. IMPRESSION: Bibasilar atelectasis or infiltrate, right greater than left. Electronically Signed   By: Eric  Mansell M.D.   On: 06/12/2020 14:57    ____________________________________________   PROCEDURES  Procedure(s) performed (including Critical Care):  .Critical Care Performed by: ,  P, MD Authorized by: ,  P, MD   Critical care provider statement:    Critical care time (minutes):  45   Critical care time was exclusive of:  Separately billable procedures and treating other patients   Critical care was necessary to treat or prevent imminent or life-threatening deterioration of the following conditions:  Sepsis and dehydration   Critical care was time spent personally by me on the following activities:  Discussions with consultants, evaluation of patient's response to treatment, examination of patient, ordering and performing treatments and interventions, ordering and review of laboratory studies, ordering and review of radiographic studies, pulse oximetry, re-evaluation of patient's condition, obtaining history from patient or surrogate and review of old charts     ____________________________________________   INITIAL IMPRESSION / ASSESSMENT AND PLAN / ED COURSE      Patient presents with above-stated history exam for couple of days of worsening abdominal pain associate with some nausea and nonbloody diarrhea.  This is in the setting of recent hospitalization for PEs and patient has been compliant with her Eliquis.  On arrival she is tachycardic with a heart rate of 102 and slightly tachypneic at 22 with otherwise stable vital signs on  room air.  Her abdomen is mildly tender throughout but nondistended and she does not appear jaundiced and she has no CVA tenderness  Differential includes   but is not limited to pancreatitis, cholecystitis, diverticulitis, pyelonephritis, cystitis, and possible SBP.  CT shows no evidence of pancreatitis, cholecystitis, appendicitis, diverticulitis, SBO or other clear acute process.  Patient is noted to have a small left-sided kidney stone which I suspect is incidental.  Chest x-ray does show some lower lobe pneumonia which could explain some of patient's upper abdominal pain.  In addition her white blood cell count is elevated at 14 and given tachycardia and tachypnea and concern about possible infectious etiologies.  In addition to possible pneumonia given findings of cirrhosis with worsening ascites SBP is also within the differential.  Will give patient dose of Rocephin in addition to obtaining blood cultures and giving IV fluids I will order IR ultrasound-guided paracentesis.  Lipase is WNL and not consistent with pancreatitis.  CMP unremarkable for significant electrolyte or metabolic derangements although patient has very mild transaminitis with AST of 70 and ALT of 58.  Alk phos is down trended at 150 from 167 11 days ago.  Troponin is nonelevated and given patient denies any chest pain no suspicion for ACS at this time with otherwise reassuring EKG. UA does not appear infected although does have large blood and 300 proteins.  Initial lactic acid is elevated 3.6  I will plan to admit to hospitalist service for with concerns for possible sepsis secondary to either pneumonia versus SBP.  Will order GI pathogen studies as well given patient reports some diarrhea.   ____________________________________________   FINAL CLINICAL IMPRESSION(S) / ED DIAGNOSES  Final diagnoses:  Ascites  Generalized abdominal pain  Pleural effusion  Diarrhea, unspecified type  Lactic acid acidosis  Leukocytosis,  unspecified type  Tachycardia    Medications  azithromycin (ZITHROMAX) 500 mg in sodium chloride 0.9 % 250 mL IVPB (has no administration in time range)  furosemide (LASIX) tablet 20 mg (has no administration in time range)  metoprolol tartrate (LOPRESSOR) tablet 12.5 mg (has no administration in time range)  pantoprazole (PROTONIX) EC tablet 40 mg (has no administration in time range)  apixaban (ELIQUIS) tablet 5 mg (has no administration in time range)  multivitamin with minerals tablet 1 tablet (has no administration in time range)  albuterol (VENTOLIN HFA) 108 (90 Base) MCG/ACT inhaler 2 puff (has no administration in time range)  lactated ringers infusion (has no administration in time range)  cefTRIAXone (ROCEPHIN) 2 g in sodium chloride 0.9 % 100 mL IVPB (has no administration in time range)  azithromycin (ZITHROMAX) 500 mg in sodium chloride 0.9 % 250 mL IVPB (has no administration in time range)  acetaminophen (TYLENOL) tablet 325 mg (has no administration in time range)    Or  acetaminophen (TYLENOL) suppository 325 mg (has no administration in time range)  ondansetron (ZOFRAN) tablet 4 mg (has no administration in time range)    Or  ondansetron (ZOFRAN) injection 4 mg (has no administration in time range)  ondansetron (ZOFRAN) tablet 4 mg (4 mg Oral Given 06/12/20 1222)  lactated ringers bolus 1,000 mL (1,000 mLs Intravenous New Bag/Given 06/12/20 1555)  cefTRIAXone (ROCEPHIN) 1 g in sodium chloride 0.9 % 100 mL IVPB (1 g Intravenous New Bag/Given 06/12/20 1614)     ED Discharge Orders    None       Note:  This document was prepared using Dragon voice recognition software and may include unintentional dictation errors.   ,  P, MD 06/12/20 1707  

## 2020-06-12 NOTE — ED Notes (Signed)
Sent pharmacy message regarding zithromax being out in main ED pixis

## 2020-06-12 NOTE — ED Triage Notes (Signed)
Pt to ED via [facility] with c/o RUQ abdominal pain that radiates into her chest, per EMS pt denies pain with laying still, pt c/o pain 8/10 with movement, per EMS pt c/o dizziness with standing, EMS reports decreased pulses noted to L foot.   96% RA 20RR 108HR

## 2020-06-12 NOTE — ED Notes (Signed)
Pt back from CT , Pt calm , collective ,

## 2020-06-12 NOTE — ED Notes (Signed)
Still waiting on IV team

## 2020-06-13 ENCOUNTER — Observation Stay: Payer: Medicare Other

## 2020-06-13 DIAGNOSIS — Z961 Presence of intraocular lens: Secondary | ICD-10-CM | POA: Diagnosis present

## 2020-06-13 DIAGNOSIS — Z9841 Cataract extraction status, right eye: Secondary | ICD-10-CM | POA: Diagnosis not present

## 2020-06-13 DIAGNOSIS — Z7901 Long term (current) use of anticoagulants: Secondary | ICD-10-CM | POA: Diagnosis not present

## 2020-06-13 DIAGNOSIS — K7460 Unspecified cirrhosis of liver: Secondary | ICD-10-CM | POA: Diagnosis present

## 2020-06-13 DIAGNOSIS — Z9049 Acquired absence of other specified parts of digestive tract: Secondary | ICD-10-CM | POA: Diagnosis not present

## 2020-06-13 DIAGNOSIS — I10 Essential (primary) hypertension: Secondary | ICD-10-CM | POA: Diagnosis present

## 2020-06-13 DIAGNOSIS — Z85828 Personal history of other malignant neoplasm of skin: Secondary | ICD-10-CM | POA: Diagnosis not present

## 2020-06-13 DIAGNOSIS — Z20822 Contact with and (suspected) exposure to covid-19: Secondary | ICD-10-CM | POA: Diagnosis present

## 2020-06-13 DIAGNOSIS — F17210 Nicotine dependence, cigarettes, uncomplicated: Secondary | ICD-10-CM | POA: Diagnosis present

## 2020-06-13 DIAGNOSIS — J181 Lobar pneumonia, unspecified organism: Secondary | ICD-10-CM

## 2020-06-13 DIAGNOSIS — Z79899 Other long term (current) drug therapy: Secondary | ICD-10-CM | POA: Diagnosis not present

## 2020-06-13 DIAGNOSIS — K7469 Other cirrhosis of liver: Secondary | ICD-10-CM | POA: Diagnosis not present

## 2020-06-13 DIAGNOSIS — K219 Gastro-esophageal reflux disease without esophagitis: Secondary | ICD-10-CM | POA: Diagnosis present

## 2020-06-13 DIAGNOSIS — A419 Sepsis, unspecified organism: Secondary | ICD-10-CM | POA: Diagnosis present

## 2020-06-13 DIAGNOSIS — K5730 Diverticulosis of large intestine without perforation or abscess without bleeding: Secondary | ICD-10-CM | POA: Diagnosis present

## 2020-06-13 DIAGNOSIS — R188 Other ascites: Secondary | ICD-10-CM | POA: Diagnosis present

## 2020-06-13 DIAGNOSIS — Z853 Personal history of malignant neoplasm of breast: Secondary | ICD-10-CM | POA: Diagnosis not present

## 2020-06-13 DIAGNOSIS — Z9842 Cataract extraction status, left eye: Secondary | ICD-10-CM | POA: Diagnosis not present

## 2020-06-13 DIAGNOSIS — Z923 Personal history of irradiation: Secondary | ICD-10-CM | POA: Diagnosis not present

## 2020-06-13 DIAGNOSIS — Z9221 Personal history of antineoplastic chemotherapy: Secondary | ICD-10-CM | POA: Diagnosis not present

## 2020-06-13 DIAGNOSIS — J90 Pleural effusion, not elsewhere classified: Secondary | ICD-10-CM | POA: Diagnosis present

## 2020-06-13 DIAGNOSIS — J189 Pneumonia, unspecified organism: Secondary | ICD-10-CM | POA: Diagnosis present

## 2020-06-13 DIAGNOSIS — Z86711 Personal history of pulmonary embolism: Secondary | ICD-10-CM | POA: Diagnosis not present

## 2020-06-13 DIAGNOSIS — N200 Calculus of kidney: Secondary | ICD-10-CM | POA: Diagnosis present

## 2020-06-13 DIAGNOSIS — N179 Acute kidney failure, unspecified: Secondary | ICD-10-CM | POA: Diagnosis present

## 2020-06-13 DIAGNOSIS — E872 Acidosis: Secondary | ICD-10-CM | POA: Diagnosis present

## 2020-06-13 LAB — ALBUMIN, PLEURAL OR PERITONEAL FLUID: Albumin, Fluid: <1 g/dL

## 2020-06-13 LAB — BODY FLUID CELL COUNT WITH DIFFERENTIAL
Eos, Fluid: 1 %
Lymphs, Fluid: 21 %
Monocyte-Macrophage-Serous Fluid: 71 %
Neutrophil Count, Fluid: 7 %
Total Nucleated Cell Count, Fluid: 68 cu mm

## 2020-06-13 LAB — CBC
HCT: 45.8 % (ref 36.0–46.0)
Hemoglobin: 14.8 g/dL (ref 12.0–15.0)
MCH: 31 pg (ref 26.0–34.0)
MCHC: 32.3 g/dL (ref 30.0–36.0)
MCV: 96 fL (ref 80.0–100.0)
Platelets: 173 10*3/uL (ref 150–400)
RBC: 4.77 MIL/uL (ref 3.87–5.11)
RDW: 16.1 % — ABNORMAL HIGH (ref 11.5–15.5)
WBC: 9.8 10*3/uL (ref 4.0–10.5)
nRBC: 0 % (ref 0.0–0.2)

## 2020-06-13 LAB — GASTROINTESTINAL PANEL BY PCR, STOOL (REPLACES STOOL CULTURE)

## 2020-06-13 LAB — COMPREHENSIVE METABOLIC PANEL
ALT: 50 U/L — ABNORMAL HIGH (ref 0–44)
AST: 54 U/L — ABNORMAL HIGH (ref 15–41)
Albumin: 1.9 g/dL — ABNORMAL LOW (ref 3.5–5.0)
Alkaline Phosphatase: 133 U/L — ABNORMAL HIGH (ref 38–126)
Anion gap: 8 (ref 5–15)
BUN: 37 mg/dL — ABNORMAL HIGH (ref 8–23)
CO2: 27 mmol/L (ref 22–32)
Calcium: 7.9 mg/dL — ABNORMAL LOW (ref 8.9–10.3)
Chloride: 105 mmol/L (ref 98–111)
Creatinine, Ser: 1.01 mg/dL — ABNORMAL HIGH (ref 0.44–1.00)
GFR, Estimated: 56 mL/min — ABNORMAL LOW (ref >60)
Glucose, Bld: 93 mg/dL (ref 70–99)
Potassium: 4.2 mmol/L (ref 3.5–5.1)
Sodium: 140 mmol/L (ref 135–145)
Total Bilirubin: 1.2 mg/dL (ref 0.3–1.2)
Total Protein: 5 g/dL — ABNORMAL LOW (ref 6.5–8.1)

## 2020-06-13 LAB — C DIFFICILE QUICK SCREEN W PCR REFLEX
C Diff antigen: NEGATIVE
C Diff interpretation: NOT DETECTED
C Diff toxin: NEGATIVE

## 2020-06-13 LAB — GLUCOSE, PLEURAL OR PERITONEAL FLUID: Glucose, Fluid: 131 mg/dL

## 2020-06-13 LAB — PROTEIN, PLEURAL OR PERITONEAL FLUID: Total protein, fluid: <3 g/dL

## 2020-06-13 LAB — LACTIC ACID, PLASMA: Lactic Acid, Venous: 1.7 mmol/L (ref 0.5–1.9)

## 2020-06-13 LAB — PROCALCITONIN: Procalcitonin: <0.1 ng/mL

## 2020-06-13 LAB — TSH: TSH: 5.181 u[IU]/mL — ABNORMAL HIGH (ref 0.350–4.500)

## 2020-06-13 LAB — PROTIME-INR
INR: 1.1 (ref 0.8–1.2)
Prothrombin Time: 13.7 seconds (ref 11.4–15.2)

## 2020-06-13 NOTE — Procedures (Signed)
Interventional Radiology Procedure:   Indications: Cirrhosis and ascites  Procedure: US guided paracentesis  Findings: Removed 200 ml from right upper quadrant  Complications: None     EBL: Less than 10 ml   Charitie Hinote R. Anselm Pancoast, MD  Pager: [phone]

## 2020-06-13 NOTE — Progress Notes (Signed)
PROGRESS NOTE    [name]  WNI:[id number] DOB: [birth year] DOA: 06/12/2020 PCP: Sallee Lange, NP   Assessment & Plan:   Active Problems:   Sepsis ([place])   Sepsis: meets criteria w/ tachypnea, leukocytosis, elevated lactic acid and likely pneumonia. Continue on IV ceftriaxone, azithromycin. Blood cxs pending.  Possible pneumonia: continue on IV ceftriaxone, azithromycin & bronchodilators. Encourage incentive spirometry    Cirrhosis: w/ ascites as per CT abd/pelvis. Etiology unclear, possibly NASH. Will continue on lasix.   Transaminitis: likely secondary to cirrhosis   Leukocytosis: resolved  AKI: baseline Cr/GFR is unknown. Cr is trending down from day prior.   Lactic acidosis: repeat lactic acid ordered.   Hx of b/l PE: continue on eliquis   HTN: continue on metoprolol, lasix   DVT prophylaxis: eliquis  Code Status: full  Family Communication:  Disposition Plan: depends on PT/OT recs   Status is: Inpatient  Remains inpatient appropriate because:Ongoing diagnostic testing needed not appropriate for outpatient work up, Unsafe d/c plan and IV treatments appropriate due to intensity of illness or inability to take PO   Dispo: The patient is from: Home              Anticipated d/c is to: Home              Anticipated d/c date is: 3 days              Patient currently is not medically stable to d/c.       Consultants:      Procedures:   Antimicrobials: azithromycin, ceftriaxone    Subjective: Pt c/o b/l foot swelling   Objective: Vitals:   06/13/20 1036 06/13/20 1130 06/13/20 1215 06/13/20 1240  BP: 131/76 135/69 134/70 (!) 127/93  Pulse: (!) 130 (!) 116 (!) 107 94  Resp:    16  Temp:    97.9 F (36.6 C)  TempSrc:    Oral  SpO2:  96% 97% 96%  Weight:      Height:        Intake/Output Summary (Last 24 hours) at 06/13/2020 1345 Last data filed at 06/13/2020 1114 Gross per 24 hour  Intake 200 ml  Output 200 ml  Net 0 ml    Filed Weights   06/12/20 1149  Weight: 79.4 kg    Examination:  General exam: Appears calm and comfortable  Respiratory system: Clear to auscultation. Respiratory effort normal. Cardiovascular system: S1 & S2 +. No rubs, gallops or clicks. 3+ b/l LE pitting edema Gastrointestinal system: Abd is soft, distended, NT & hypoactive bowel sounds  Central nervous system: Alert and oriented. Moves all 4 extremities  Psychiatry: Judgement and insight appear normal. Appears agitated & frustrated     Data Reviewed: I have personally reviewed following labs and imaging studies  CBC: Recent Labs  Lab 06/12/20 1323 06/13/20 0838  WBC 14.0* 9.8  NEUTROABS 10.7*  --   HGB 17.0* 14.8  HCT 51.5* 45.8  MCV 94.5 96.0  PLT 250 381   Basic Metabolic Panel: Recent Labs  Lab 06/12/20 1323 06/13/20 0838  NA 141 140  K 4.1 4.2  CL 106 105  CO2 23 27  GLUCOSE 120* 93  BUN 40* 37*  CREATININE 1.04* 1.01*  CALCIUM 8.1* 7.9*   GFR: Estimated Creatinine Clearance: 47.5 mL/min (A) (by C-G formula based on SCr of 1.01 mg/dL (H)). Liver Function Tests: Recent Labs  Lab 06/12/20 1323 06/13/20 0838  AST 70* 54*  ALT 58* 50*  ALKPHOS 150* 133*  BILITOT 1.2 1.2  PROT 5.8* 5.0*  ALBUMIN 2.2* 1.9*   Recent Labs  Lab 06/12/20 1323  LIPASE 38   No results for input(s): AMMONIA in the last 168 hours. Coagulation Profile: Recent Labs  Lab 06/12/20 1535 06/13/20 0838  INR 1.0 1.1   Cardiac Enzymes: Recent Labs  Lab 06/12/20 1323  CKTOTAL 27*   BNP (last 3 results) No results for input(s): PROBNP in the last 8760 hours. HbA1C: No results for input(s): HGBA1C in the last 72 hours. CBG: No results for input(s): GLUCAP in the last 168 hours. Lipid Profile: No results for input(s): CHOL, HDL, LDLCALC, TRIG, CHOLHDL, LDLDIRECT in the last 72 hours. Thyroid Function Tests: Recent Labs    06/13/20 0830  TSH 5.181*   Anemia Panel: No results for input(s): VITAMINB12, FOLATE,  FERRITIN, TIBC, IRON, RETICCTPCT in the last 72 hours. Sepsis Labs: Recent Labs  Lab 06/12/20 1323  LATICACIDVEN 3.6*    Recent Results (from the past 240 hour(s))  Resp Panel by RT-PCR (Flu A&B, Covid) Nasopharyngeal Swab     Status: None   Collection Time: 06/12/20  3:35 PM   Specimen: Nasopharyngeal Swab; Nasopharyngeal(NP) swabs in vial transport medium  Result Value Ref Range Status   SARS Coronavirus 2 by RT PCR NEGATIVE NEGATIVE Final    Comment: (NOTE) SARS-CoV-2 target nucleic acids are NOT DETECTED.  The SARS-CoV-2 RNA is generally detectable in upper respiratory specimens during the acute phase of infection. The lowest concentration of SARS-CoV-2 viral copies this assay can detect is 138 copies/mL. A negative result does not preclude SARS-Cov-2 infection and should not be used as the sole basis for treatment or other patient management decisions. A negative result may occur with  improper specimen collection/handling, submission of specimen other than nasopharyngeal swab, presence of viral mutation(s) within the areas targeted by this assay, and inadequate number of viral copies(<138 copies/mL). A negative result must be combined with clinical observations, patient history, and epidemiological information. The expected result is Negative.  Fact Sheet for Patients:  EntrepreneurPulse.com.au  Fact Sheet for Healthcare Providers:  IncredibleEmployment.be  This test is no t yet approved or cleared by the [place] FDA and  has been authorized for detection and/or diagnosis of SARS-CoV-2 by FDA under an Emergency Use Authorization (EUA). This EUA will remain  in effect (meaning this test can be used) for the duration of the COVID-19 declaration under Section 564(b)(1) of the Act, 21 U.S.C.section 360bbb-3(b)(1), unless the authorization is terminated  or revoked sooner.       Influenza A by PCR NEGATIVE NEGATIVE Final    Influenza B by PCR NEGATIVE NEGATIVE Final    Comment: (NOTE) The Xpert Xpress SARS-CoV-2/FLU/RSV plus assay is intended as an aid in the diagnosis of influenza from Nasopharyngeal swab specimens and should not be used as a sole basis for treatment. Nasal washings and aspirates are unacceptable for Xpert Xpress SARS-CoV-2/FLU/RSV testing.  Fact Sheet for Patients: EntrepreneurPulse.com.au  Fact Sheet for Healthcare Providers: IncredibleEmployment.be  This test is not yet approved or cleared by the [place] FDA and has been authorized for detection and/or diagnosis of SARS-CoV-2 by FDA under an Emergency Use Authorization (EUA). This EUA will remain in effect (meaning this test can be used) for the duration of the COVID-19 declaration under Section 564(b)(1) of the Act, 21 U.S.C. section 360bbb-3(b)(1), unless the authorization is terminated or revoked.  Performed at [facility], [address] [place], [place] [address]  CULTURE, BLOOD (ROUTINE X 2) w Reflex to ID Panel     Status: None (Preliminary result)   Collection Time: 06/13/20 12:56 AM   Specimen: BLOOD  Result Value Ref Range Status   Specimen Description BLOOD RIGHT HAND  Final   Special Requests   Final    BOTTLES DRAWN AEROBIC AND ANAEROBIC Blood Culture results may not be optimal due to an inadequate volume of blood received in culture bottles   Culture   Final    NO GROWTH < 12 HOURS Performed at [facility], [address] [place], [place] [address]    Report Status PENDING  Incomplete  CULTURE, BLOOD (ROUTINE X 2) w Reflex to ID Panel     Status: None (Preliminary result)   Collection Time: 06/13/20 12:56 AM   Specimen: BLOOD  Result Value Ref Range Status   Specimen Description BLOOD LEFT HAND  Final   Special Requests   Final    BOTTLES DRAWN AEROBIC AND ANAEROBIC Blood Culture results may not be optimal due to an inadequate volume of blood  received in culture bottles   Culture   Final    NO GROWTH < 12 HOURS Performed at [facility], [address] [place], [place] [address]    Report Status PENDING  Incomplete  Gastrointestinal Panel by PCR , Stool     Status: None   Collection Time: 06/13/20  8:38 AM   Specimen: Stool  Result Value Ref Range Status   Campylobacter species NOT DETECTED NOT DETECTED Final   Plesimonas shigelloides NOT DETECTED NOT DETECTED Final   Salmonella species NOT DETECTED NOT DETECTED Final   Yersinia enterocolitica NOT DETECTED NOT DETECTED Final   Vibrio species NOT DETECTED NOT DETECTED Final   Vibrio cholerae NOT DETECTED NOT DETECTED Final   Enteroaggregative E coli (EAEC) NOT DETECTED NOT DETECTED Final   Enteropathogenic E coli (EPEC) NOT DETECTED NOT DETECTED Final   Enterotoxigenic E coli (ETEC) NOT DETECTED NOT DETECTED Final   Shiga like toxin producing E coli (STEC) NOT DETECTED NOT DETECTED Final   Shigella/Enteroinvasive E coli (EIEC) NOT DETECTED NOT DETECTED Final   Cryptosporidium NOT DETECTED NOT DETECTED Final   Cyclospora cayetanensis NOT DETECTED NOT DETECTED Final   Entamoeba histolytica NOT DETECTED NOT DETECTED Final   Giardia lamblia NOT DETECTED NOT DETECTED Final   Adenovirus F40/41 NOT DETECTED NOT DETECTED Final   Astrovirus NOT DETECTED NOT DETECTED Final   Norovirus GI/GII NOT DETECTED NOT DETECTED Final   Rotavirus A NOT DETECTED NOT DETECTED Final   Sapovirus (I, II, IV, and V) NOT DETECTED NOT DETECTED Final    Comment: Performed at [facility], [address] [place], [place] [address]  C Difficile Quick Screen w PCR reflex     Status: None   Collection Time: 06/13/20  8:38 AM   Specimen: Stool  Result Value Ref Range Status   C Diff antigen NEGATIVE NEGATIVE Final   C Diff toxin NEGATIVE NEGATIVE Final   C Diff interpretation No C. difficile detected.  Final    Comment: Performed at [facility], [address]  [place], [place] [address]         Radiology Studies: CT ABDOMEN PELVIS WO CONTRAST  Result Date: 06/12/2020 CLINICAL DATA:  Abdominal pain EXAM: CT ABDOMEN AND PELVIS WITHOUT CONTRAST TECHNIQUE: Multidetector CT imaging of the abdomen and pelvis was performed following the standard protocol without IV contrast. COMPARISON:  06/01/2020 FINDINGS: Lower chest: Small bilateral pleural  effusions appear new from previous exam. Cardiac enlargement. Aortic atherosclerosis and coronary artery calcifications identified. Soft tissue lesion with adjacent surgical clips noted in the lateral right breast, image 3/2. Hepatobiliary: The liver is atrophic and cirrhotic with a diffuse nodular contour. Within the limitations of unenhanced technique there is no focal liver abnormality. Previous cholecystectomy. No signs of biliary dilatation. Pancreas: Unremarkable. No pancreatic ductal dilatation or surrounding inflammatory changes. Spleen: Normal in size without focal abnormality. Adrenals/Urinary Tract: Normal appearance of the adrenal glands. Bilateral renal sinus cysts are again noted. Nonobstructing left renal calculi measures 3 mm, image 42/2. Exophytic lesion arising off the inferior pole of right kidney anteriorly is incompletely characterized without IV contrast measuring 1.7 cm, image 48/2. Urinary bladder appears unremarkable. Stomach/Bowel: Stomach is unremarkable. The appendix is visualized and appears normal. No bowel wall thickening, inflammation or distension. Colonic diverticulosis noted. Vascular/Lymphatic: Aortic atherosclerosis. Upper abdominal varices. No abdominopelvic adenopathy. Reproductive: Uterus and bilateral adnexa are unremarkable. Other: There is been interval increase in volume of abdominal and pelvic ascites when compared with 06/01/2020. No focal fluid collection identified. The calcified lesion within the anterior aspect of the upper abdomen is unchanged measuring 1.8 cm, image 48/2. Favor  benign abnormality. Musculoskeletal: Status post solid fusion of L3 through L5. Bone graft harvesting sites identified within the left iliac bone. No acute or suspicious osseous findings. IMPRESSION: 1. Morphologic features of the liver compatible with cirrhosis. Stigmata of portal venous hypertension is noted including varices and ascites. When compared with the previous exam there is been interval increase in volume of ascites within the abdomen and pelvis. 2. New small bilateral pleural effusions. 3. Aortic atherosclerosis and coronary artery calcifications. 4. Nonobstructing left renal calculus. Aortic Atherosclerosis (ICD10-I70.0). Electronically Signed   By: Kerby Moors M.D.   On: 06/12/2020 12:55   DG Chest 1 View  Result Date: 06/12/2020 CLINICAL DATA:  Right upper quadrant pain. EXAM: CHEST  1 VIEW COMPARISON:  10/11/2015 FINDINGS: Patchy opacity at the right base compatible with atelectasis or infiltrate. Streaky/linear density at the left base suggest atelectasis. The cardio pericardial silhouette is enlarged. Bones are diffusely demineralized. IMPRESSION: Bibasilar atelectasis or infiltrate, right greater than left. Electronically Signed   By: Misty Stanley M.D.   On: 06/12/2020 14:57   US Paracentesis  Result Date: 06/13/2020 INDICATION: 85 year old with cirrhosis and ascites. Request for diagnostic paracentesis. EXAM: ULTRASOUND GUIDED DIAGNOSTIC PARACENTESIS MEDICATIONS: None. COMPLICATIONS: None immediate. PROCEDURE: Informed written consent was obtained from the patient after a discussion of the risks, benefits and alternatives to treatment. A timeout was performed prior to the initiation of the procedure. Initial ultrasound scanning demonstrates a small amount of ascites within the right upper abdominal quadrant. The right lower abdomen was prepped and draped in the usual sterile fashion. 1% lidocaine was used for local anesthesia. Following this, a 19 gauge Yueh catheter was introduced  using ultrasound guidance. An ultrasound image was saved for documentation purposes. The paracentesis was performed. The catheter was removed and a dressing was applied. The patient tolerated the procedure well without immediate post procedural complication. Patient received post-procedure intravenous albumin; see nursing notes for details. FINDINGS: A total of approximately 200 mL of yellow opaque fluid was removed. Samples were sent to the laboratory as requested by the clinical team. IMPRESSION: Successful ultrasound-guided paracentesis yielding 200 mL of peritoneal fluid. Electronically Signed   By: Markus Daft M.D.   On: 06/13/2020 12:57        Scheduled Meds: . apixaban  5 mg  Oral BID  . furosemide  20 mg Oral Daily  . metoprolol tartrate  12.5 mg Oral BID  . multivitamin with minerals  1 tablet Oral Daily  . pantoprazole  40 mg Oral Daily   Continuous Infusions: . azithromycin 500 mg (06/13/20 1245)  . cefTRIAXone (ROCEPHIN)  IV Stopped (06/13/20 1114)     LOS: 0 days    Time spent: 36 mins     Wyvonnia Dusky, MD [facility] Pager [phone]  If 7PM-7AM, please contact night-coverage 06/13/2020, 1:45 PM

## 2020-06-13 NOTE — Progress Notes (Signed)
Patient refused bed alarm. Said she would call out if she needed anything. Patient is A&Ox4 and call bell is in reach.

## 2020-06-14 DIAGNOSIS — A419 Sepsis, unspecified organism: Secondary | ICD-10-CM | POA: Diagnosis not present

## 2020-06-14 DIAGNOSIS — J181 Lobar pneumonia, unspecified organism: Secondary | ICD-10-CM | POA: Diagnosis not present

## 2020-06-14 DIAGNOSIS — K7469 Other cirrhosis of liver: Secondary | ICD-10-CM | POA: Diagnosis not present

## 2020-06-14 LAB — COMPREHENSIVE METABOLIC PANEL
ALT: 46 U/L — ABNORMAL HIGH (ref 0–44)
AST: 54 U/L — ABNORMAL HIGH (ref 15–41)
Albumin: 1.6 g/dL — ABNORMAL LOW (ref 3.5–5.0)
Alkaline Phosphatase: 124 U/L (ref 38–126)
Anion gap: 7 (ref 5–15)
BUN: 35 mg/dL — ABNORMAL HIGH (ref 8–23)
CO2: 26 mmol/L (ref 22–32)
Calcium: 7.6 mg/dL — ABNORMAL LOW (ref 8.9–10.3)
Chloride: 106 mmol/L (ref 98–111)
Creatinine, Ser: 0.99 mg/dL (ref 0.44–1.00)
GFR, Estimated: 57 mL/min — ABNORMAL LOW (ref >60)
Glucose, Bld: 95 mg/dL (ref 70–99)
Potassium: 4 mmol/L (ref 3.5–5.1)
Sodium: 139 mmol/L (ref 135–145)
Total Bilirubin: 0.8 mg/dL (ref 0.3–1.2)
Total Protein: 4.4 g/dL — ABNORMAL LOW (ref 6.5–8.1)

## 2020-06-14 LAB — URINE CULTURE: Culture: NO GROWTH

## 2020-06-14 LAB — CBC
HCT: 41.3 % (ref 36.0–46.0)
Hemoglobin: 13.7 g/dL (ref 12.0–15.0)
MCH: 31.1 pg (ref 26.0–34.0)
MCHC: 33.2 g/dL (ref 30.0–36.0)
MCV: 93.9 fL (ref 80.0–100.0)
Platelets: 175 10*3/uL (ref 150–400)
RBC: 4.4 MIL/uL (ref 3.87–5.11)
RDW: 16 % — ABNORMAL HIGH (ref 11.5–15.5)
WBC: 8.9 10*3/uL (ref 4.0–10.5)
nRBC: 0 % (ref 0.0–0.2)

## 2020-06-14 LAB — PROTEIN, BODY FLUID (OTHER): Total Protein, Body Fluid Other: 0.4 g/dL

## 2020-06-14 LAB — PH, BODY FLUID: pH, Body Fluid: 8

## 2020-06-14 MED ORDER — SODIUM CHLORIDE 0.9 % IV SOLN
INTRAVENOUS | Status: DC | PRN
  Administered 2020-06-15: 250 mL via INTRAVENOUS

## 2020-06-14 NOTE — Evaluation (Signed)
Occupational Therapy Evaluation Patient Details Name: [name] MRN: [id number] DOB: 12-03-1935 Today's Date: 06/14/2020    History of Present Illness Pt is an 85 y.o. female with medical history significant for bilateral PE, hypertension, breast cancer status post right lumpectomy currently in remission, atrial fibrillation, GERD, presented to the emergency department for chief concerns of acute weakness.  MD assessment includes: Sepsis, possible pneumonia, cirrhosis, transaminitis, leukocytosis, AKI, lactic acidosis, and HTN.   Clinical Impression   Patient presenting with decreased I in self care, balance, functional mobility/transfer, endurance, and safety awareness.  Patient reports being independent in self care and functional mobility PTA. Pt does living in mobile home with ex-spouse and reports they help each other with IADL tasks.  Patient currently functioning at supervision with use of RW and min guard without. Pt is very motivated and displays no significant LOB with use of RW this session. She does endorse having difficulty getting up from commode at home and OT discussed possible need for 3 in 1 to elevate commode. Patient will benefit from acute OT to increase overall independence in the areas of ADLs, functional mobility, and safety awareness in order to safely discharge and safety awareness.    Follow Up Recommendations  No OT follow up;Supervision - Intermittent    Equipment Recommendations  3 in 1 bedside commode       Precautions / Restrictions Precautions Precautions: Fall Restrictions Weight Bearing Restrictions: No      Mobility Bed Mobility Overal bed mobility: Modified Independent             General bed mobility comments: seated in recliner chair upon entering the room    Transfers Overall transfer level: Needs assistance Equipment used: Rolling walker (2 wheeled) Transfers: Sit to/from Stand Sit to Stand: Supervision         General  transfer comment: Fair eccentric and concentric control and stability    Balance Overall balance assessment: Needs assistance Sitting-balance support: No upper extremity supported;Feet supported Sitting balance-Leahy Scale: Normal Sitting balance - Comments: steady sitting reaching outside BOS   Standing balance support: During functional activity;Bilateral upper extremity supported Standing balance-Leahy Scale: Good Standing balance comment: no loss of balance with ambulation                           ADL either performed or assessed with clinical judgement   ADL Overall ADL's : Needs assistance/impaired Eating/Feeding: Independent   Grooming: Wash/dry hands;Wash/dry face;Oral care;Standing;Supervision/safety;Brushing hair                   Toilet Transfer: Economist and Hygiene: Supervision/safety;Sit to/from stand         General ADL Comments: Pt requires min guard without use of AD but supervision with use of RW for functional transfers and tasks in standing     Vision Patient Visual Report: No change from baseline              Pertinent Vitals/Pain Pain Assessment: No/denies pain     Hand Dominance Right   Extremity/Trunk Assessment Upper Extremity Assessment Upper Extremity Assessment: Generalized weakness   Lower Extremity Assessment Lower Extremity Assessment: Generalized weakness       Communication Communication Communication: No difficulties   Cognition Arousal/Alertness: Awake/alert Behavior During Therapy: WFL for tasks assessed/performed Overall Cognitive Status: Within Functional Limits for tasks assessed  Exercises Total Joint Exercises Ankle Circles/Pumps: Strengthening;Both;10 reps (with manual resistance) Quad Sets: Strengthening;Both;10 reps Gluteal Sets: Strengthening;Both;10 reps Heel Slides:  Strengthening;Both;10 reps Hip ABduction/ADduction: Strengthening;Both;10 reps Straight Leg Raises: Strengthening;Both;10 reps Long Arc Quad: AROM;Both;10 reps;5 reps Knee Flexion: AROM;Both;10 reps;5 reps Marching in Standing: Strengthening;Both;5 reps;Standing Other Exercises Other Exercises: HEP education for BLE APs, QS, GS, and LAQs x 10 each 5-6x/day        Home Living Family/patient expects to be discharged to:: Private residence Living Arrangements: Other (Comment) (ex-spouse) Available Help at Discharge: Family;Available 24 hours/day Type of Home: Mobile home Home Access: Stairs to enter Entrance Stairs-Number of Steps: 1 with Bil posts   Home Layout: One level     Bathroom Shower/Tub: Occupational psychologist: Handicapped height     Home Equipment: None   Additional Comments: Pt reports having walker at home but that it is very old and in disrepair      Prior Functioning/Environment Level of Independence: Independent        Comments: Ind amb limited community distances without an AD, Ind with ADLs, does yardwork, 3 falls in the last 6 months, pt uncertain of cause        OT Problem List: Decreased strength;Decreased activity tolerance;Impaired balance (sitting and/or standing);Decreased safety awareness;Pain;Impaired UE functional use;Decreased knowledge of use of DME or AE;Decreased knowledge of precautions      OT Treatment/Interventions: Self-care/ADL training;Therapeutic exercise;Energy conservation;DME and/or AE instruction;Therapeutic activities;Balance training;Patient/family education;Manual therapy    OT Goals(Current goals can be found in the care plan section) Acute Rehab OT Goals Patient Stated Goal: To get stronger and walk without falling OT Goal Formulation: With patient Time For Goal Achievement: 06/28/20 Potential to Achieve Goals: Good  OT Frequency: Min 2X/week   Barriers to D/C:    none known at this time           AM-PAC OT "6 Clicks" Daily Activity     Outcome Measure Help from another person eating meals?: None Help from another person taking care of personal grooming?: None Help from another person toileting, which includes using toliet, bedpan, or urinal?: A Little Help from another person bathing (including washing, rinsing, drying)?: A Little Help from another person to put on and taking off regular upper body clothing?: None Help from another person to put on and taking off regular lower body clothing?: A Little 6 Click Score: 21   End of Session    Activity Tolerance: Patient tolerated treatment well Patient left: in chair;with call bell/phone within reach (Pt refuses chair alarm)  OT Visit Diagnosis: Repeated falls (R29.6);Muscle weakness (generalized) (M62.81)                Time: 1005-1030 OT Time Calculation (min): 25 min Charges:  OT General Charges $OT Visit: 1 Visit OT Evaluation $OT Eval Low Complexity: 1 Low OT Treatments $Self Care/Home Management : 8-22 mins  Darleen Crocker, MS, OTR/L , CBIS ascom [phone]  06/14/20, 2:53 PM

## 2020-06-14 NOTE — Progress Notes (Signed)
PROGRESS NOTE    [name]  XTK:[id number] DOB: 02-08-1936 DOA: 06/12/2020 PCP: Sallee Lange, NP   Assessment & Plan:   Active Problems:   Sepsis ([place])   Sepsis: meets criteria w/ tachypnea, leukocytosis, elevated lactic acid and likely pneumonia. Continue on IV ceftriaxone, azithromycin. Blood cxs NGTD  Possible pneumonia: improving slowly. Continue on IV azithromycin, ceftriaxone & bronchodilators. Encourage incentive spirometry   Cirrhosis: w/ ascites as per CT abd/pelvis. Etiology unclear, possibly NASH. Will continue on lasix. Monitor I/Os. S/p paracentesis 06/13/20 & fluid studies are pending. Will need to see GI as an outpatient    Transaminitis: likely secondary to cirrhosis   Leukocytosis: resolved  AKI: resolved   Lactic acidosis: resolved   Hx of b/l PE: continue on eliquis   HTN: continue on metoprolol, lasix   DVT prophylaxis: eliquis  Code Status: full  Family Communication:  Disposition Plan: depends on PT/OT recs   Status is: Inpatient  Remains inpatient appropriate because:Ongoing diagnostic testing needed not appropriate for outpatient work up, Unsafe d/c plan and IV treatments appropriate due to intensity of illness or inability to take PO   Dispo: The patient is from: Home              Anticipated d/c is to: Home              Anticipated d/c date is: 3 days              Patient currently is not medically stable to d/c.       Consultants:      Procedures:   Antimicrobials: azithromycin, ceftriaxone    Subjective: Pt still c/o b/l foot swelling   Objective: Vitals:   06/13/20 1428 06/13/20 1724 06/13/20 2011 06/14/20 0506  BP: 134/88 131/89 109/73 126/79  Pulse: 87 86 81 (!) 41  Resp: 18 20 18 16   Temp: 98.1 F (36.7 C) 97.6 F (36.4 C) 97.8 F (36.6 C) 97.9 F (36.6 C)  TempSrc: Oral   Oral  SpO2: 98% 97% 94% 93%  Weight: 84.3 kg     Height: 5\' 8"  (1.727 m)       Intake/Output Summary (Last 24 hours)  at 06/14/2020 0734 Last data filed at 06/13/2020 1345 Gross per 24 hour  Intake 345 ml  Output -  Net 345 ml   Filed Weights   06/12/20 1149 06/13/20 1428  Weight: 79.4 kg 84.3 kg    Examination:  General exam: Appears calm & comfortable  Respiratory system: decreased breath sounds b/l  Cardiovascular system: SS1/ S2+. No clicks or rubs. 3+ b/l LE pitting edema Gastrointestinal system: Abd is soft, NT, ND & hypoactive bowel sounds  Central nervous system: Alert and oriented. Moves all 4 extremities  Psychiatry: Judgement and insight appear normal. Flat mood and affect     Data Reviewed: I have personally reviewed following labs and imaging studies  CBC: Recent Labs  Lab 06/12/20 1323 06/13/20 0838 06/14/20 0229  WBC 14.0* 9.8 8.9  NEUTROABS 10.7*  --   --   HGB 17.0* 14.8 13.7  HCT 51.5* 45.8 41.3  MCV 94.5 96.0 93.9  PLT 250 173 992   Basic Metabolic Panel: Recent Labs  Lab 06/12/20 1323 06/13/20 0838 06/14/20 0229  NA 141 140 139  K 4.1 4.2 4.0  CL 106 105 106  CO2 23 27 26   GLUCOSE 120* 93 95  BUN 40* 37* 35*  CREATININE 1.04* 1.01* 0.99  CALCIUM 8.1* 7.9* 7.6*  GFR: Estimated Creatinine Clearance: 49.9 mL/min (by C-G formula based on SCr of 0.99 mg/dL). Liver Function Tests: Recent Labs  Lab 06/12/20 1323 06/13/20 0838 06/14/20 0229  AST 70* 54* 54*  ALT 58* 50* 46*  ALKPHOS 150* 133* 124  BILITOT 1.2 1.2 0.8  PROT 5.8* 5.0* 4.4*  ALBUMIN 2.2* 1.9* 1.6*   Recent Labs  Lab 06/12/20 1323  LIPASE 38   No results for input(s): AMMONIA in the last 168 hours. Coagulation Profile: Recent Labs  Lab 06/12/20 1535 06/13/20 0838  INR 1.0 1.1   Cardiac Enzymes: Recent Labs  Lab 06/12/20 1323  CKTOTAL 27*   BNP (last 3 results) No results for input(s): PROBNP in the last 8760 hours. HbA1C: No results for input(s): HGBA1C in the last 72 hours. CBG: No results for input(s): GLUCAP in the last 168 hours. Lipid Profile: No results for  input(s): CHOL, HDL, LDLCALC, TRIG, CHOLHDL, LDLDIRECT in the last 72 hours. Thyroid Function Tests: Recent Labs    06/13/20 0830  TSH 5.181*   Anemia Panel: No results for input(s): VITAMINB12, FOLATE, FERRITIN, TIBC, IRON, RETICCTPCT in the last 72 hours. Sepsis Labs: Recent Labs  Lab 06/12/20 1323 06/13/20 0830 06/13/20 1448  PROCALCITON  --  <0.10  --   LATICACIDVEN 3.6*  --  1.7    Recent Results (from the past 240 hour(s))  Resp Panel by RT-PCR (Flu A&B, Covid) Nasopharyngeal Swab     Status: None   Collection Time: 06/12/20  3:35 PM   Specimen: Nasopharyngeal Swab; Nasopharyngeal(NP) swabs in vial transport medium  Result Value Ref Range Status   SARS Coronavirus 2 by RT PCR NEGATIVE NEGATIVE Final    Comment: (NOTE) SARS-CoV-2 target nucleic acids are NOT DETECTED.  The SARS-CoV-2 RNA is generally detectable in upper respiratory specimens during the acute phase of infection. The lowest concentration of SARS-CoV-2 viral copies this assay can detect is 138 copies/mL. A negative result does not preclude SARS-Cov-2 infection and should not be used as the sole basis for treatment or other patient management decisions. A negative result may occur with  improper specimen collection/handling, submission of specimen other than nasopharyngeal swab, presence of viral mutation(s) within the areas targeted by this assay, and inadequate number of viral copies(<138 copies/mL). A negative result must be combined with clinical observations, patient history, and epidemiological information. The expected result is Negative.  Fact Sheet for Patients:  EntrepreneurPulse.com.au  Fact Sheet for Healthcare Providers:  IncredibleEmployment.be  This test is no t yet approved or cleared by the [place] FDA and  has been authorized for detection and/or diagnosis of SARS-CoV-2 by FDA under an Emergency Use Authorization (EUA). This EUA will remain   in effect (meaning this test can be used) for the duration of the COVID-19 declaration under Section 564(b)(1) of the Act, 21 U.S.C.section 360bbb-3(b)(1), unless the authorization is terminated  or revoked sooner.       Influenza A by PCR NEGATIVE NEGATIVE Final   Influenza B by PCR NEGATIVE NEGATIVE Final    Comment: (NOTE) The Xpert Xpress SARS-CoV-2/FLU/RSV plus assay is intended as an aid in the diagnosis of influenza from Nasopharyngeal swab specimens and should not be used as a sole basis for treatment. Nasal washings and aspirates are unacceptable for Xpert Xpress SARS-CoV-2/FLU/RSV testing.  Fact Sheet for Patients: EntrepreneurPulse.com.au  Fact Sheet for Healthcare Providers: IncredibleEmployment.be  This test is not yet approved or cleared by the [place] FDA and has been authorized for detection and/or diagnosis of  SARS-CoV-2 by FDA under an Emergency Use Authorization (EUA). This EUA will remain in effect (meaning this test can be used) for the duration of the COVID-19 declaration under Section 564(b)(1) of the Act, 21 U.S.C. section 360bbb-3(b)(1), unless the authorization is terminated or revoked.  Performed at [facility], [address] [place], [place] [address]   CULTURE, BLOOD (ROUTINE X 2) w Reflex to ID Panel     Status: None (Preliminary result)   Collection Time: 06/13/20 12:56 AM   Specimen: BLOOD  Result Value Ref Range Status   Specimen Description BLOOD RIGHT HAND  Final   Special Requests   Final    BOTTLES DRAWN AEROBIC AND ANAEROBIC Blood Culture results may not be optimal due to an inadequate volume of blood received in culture bottles   Culture   Final    NO GROWTH 1 DAY Performed at [facility], [address], [place], [place] [address]    Report Status PENDING  Incomplete  CULTURE, BLOOD (ROUTINE X 2) w Reflex to ID Panel     Status: None (Preliminary result)    Collection Time: 06/13/20 12:56 AM   Specimen: BLOOD  Result Value Ref Range Status   Specimen Description BLOOD LEFT HAND  Final   Special Requests   Final    BOTTLES DRAWN AEROBIC AND ANAEROBIC Blood Culture results may not be optimal due to an inadequate volume of blood received in culture bottles   Culture   Final    NO GROWTH 1 DAY Performed at [facility], [address] [place], [place] [address]    Report Status PENDING  Incomplete  Gastrointestinal Panel by PCR , Stool     Status: None   Collection Time: 06/13/20  8:38 AM   Specimen: Stool  Result Value Ref Range Status   Campylobacter species NOT DETECTED NOT DETECTED Final   Plesimonas shigelloides NOT DETECTED NOT DETECTED Final   Salmonella species NOT DETECTED NOT DETECTED Final   Yersinia enterocolitica NOT DETECTED NOT DETECTED Final   Vibrio species NOT DETECTED NOT DETECTED Final   Vibrio cholerae NOT DETECTED NOT DETECTED Final   Enteroaggregative E coli (EAEC) NOT DETECTED NOT DETECTED Final   Enteropathogenic E coli (EPEC) NOT DETECTED NOT DETECTED Final   Enterotoxigenic E coli (ETEC) NOT DETECTED NOT DETECTED Final   Shiga like toxin producing E coli (STEC) NOT DETECTED NOT DETECTED Final   Shigella/Enteroinvasive E coli (EIEC) NOT DETECTED NOT DETECTED Final   Cryptosporidium NOT DETECTED NOT DETECTED Final   Cyclospora cayetanensis NOT DETECTED NOT DETECTED Final   Entamoeba histolytica NOT DETECTED NOT DETECTED Final   Giardia lamblia NOT DETECTED NOT DETECTED Final   Adenovirus F40/41 NOT DETECTED NOT DETECTED Final   Astrovirus NOT DETECTED NOT DETECTED Final   Norovirus GI/GII NOT DETECTED NOT DETECTED Final   Rotavirus A NOT DETECTED NOT DETECTED Final   Sapovirus (I, II, IV, and V) NOT DETECTED NOT DETECTED Final    Comment: Performed at [facility], [address] [place], [place] [address]  C Difficile Quick Screen w PCR reflex     Status: None   Collection Time: 06/13/20   8:38 AM   Specimen: Stool  Result Value Ref Range Status   C Diff antigen NEGATIVE NEGATIVE Final   C Diff toxin NEGATIVE NEGATIVE Final   C Diff interpretation No C. difficile detected.  Final    Comment: Performed at [facility], [address] [place], [place] [address]  Body fluid culture     Status: None (Preliminary result)   Collection Time: 06/13/20 12:40 PM   Specimen: PATH Cytology Peritoneal fluid  Result Value Ref Range Status   Specimen Description   Final    PERITONEAL Performed at [facility], [address], [place], [place] [address]    Special Requests   Final    NONE Performed at [facility], [address] [place], [place] [address]    Gram Stain   Final    RARE WBC PRESENT, PREDOMINANTLY MONONUCLEAR NO ORGANISMS SEEN Performed at [facility], [place] [address], [place], [place] [address]    Culture PENDING  Incomplete   Report Status PENDING  Incomplete         Radiology Studies: CT ABDOMEN PELVIS WO CONTRAST  Result Date: 06/12/2020 CLINICAL DATA:  Abdominal pain EXAM: CT ABDOMEN AND PELVIS WITHOUT CONTRAST TECHNIQUE: Multidetector CT imaging of the abdomen and pelvis was performed following the standard protocol without IV contrast. COMPARISON:  06/01/2020 FINDINGS: Lower chest: Small bilateral pleural effusions appear new from previous exam. Cardiac enlargement. Aortic atherosclerosis and coronary artery calcifications identified. Soft tissue lesion with adjacent surgical clips noted in the lateral right breast, image 3/2. Hepatobiliary: The liver is atrophic and cirrhotic with a diffuse nodular contour. Within the limitations of unenhanced technique there is no focal liver abnormality. Previous cholecystectomy. No signs of biliary dilatation. Pancreas: Unremarkable. No pancreatic ductal dilatation or surrounding inflammatory changes. Spleen: Normal in size without focal abnormality. Adrenals/Urinary Tract: Normal  appearance of the adrenal glands. Bilateral renal sinus cysts are again noted. Nonobstructing left renal calculi measures 3 mm, image 42/2. Exophytic lesion arising off the inferior pole of right kidney anteriorly is incompletely characterized without IV contrast measuring 1.7 cm, image 48/2. Urinary bladder appears unremarkable. Stomach/Bowel: Stomach is unremarkable. The appendix is visualized and appears normal. No bowel wall thickening, inflammation or distension. Colonic diverticulosis noted. Vascular/Lymphatic: Aortic atherosclerosis. Upper abdominal varices. No abdominopelvic adenopathy. Reproductive: Uterus and bilateral adnexa are unremarkable. Other: There is been interval increase in volume of abdominal and pelvic ascites when compared with 06/01/2020. No focal fluid collection identified. The calcified lesion within the anterior aspect of the upper abdomen is unchanged measuring 1.8 cm, image 48/2. Favor benign abnormality. Musculoskeletal: Status post solid fusion of L3 through L5. Bone graft harvesting sites identified within the left iliac bone. No acute or suspicious osseous findings. IMPRESSION: 1. Morphologic features of the liver compatible with cirrhosis. Stigmata of portal venous hypertension is noted including varices and ascites. When compared with the previous exam there is been interval increase in volume of ascites within the abdomen and pelvis. 2. New small bilateral pleural effusions. 3. Aortic atherosclerosis and coronary artery calcifications. 4. Nonobstructing left renal calculus. Aortic Atherosclerosis (ICD10-I70.0). Electronically Signed   By: Kerby Moors M.D.   On: 06/12/2020 12:55   DG Chest 1 View  Result Date: 06/12/2020 CLINICAL DATA:  Right upper quadrant pain. EXAM: CHEST  1 VIEW COMPARISON:  10/11/2015 FINDINGS: Patchy opacity at the right base compatible with atelectasis or infiltrate. Streaky/linear density at the left base suggest atelectasis. The cardio pericardial  silhouette is enlarged. Bones are diffusely demineralized. IMPRESSION: Bibasilar atelectasis or infiltrate, right greater than left. Electronically Signed   By: Misty Stanley M.D.   On: 06/12/2020 14:57   US Paracentesis  Result Date: 06/13/2020 INDICATION: 85 year old with cirrhosis and ascites. Request for diagnostic paracentesis. EXAM: ULTRASOUND GUIDED DIAGNOSTIC PARACENTESIS MEDICATIONS: None. COMPLICATIONS: None immediate. PROCEDURE:  Informed written consent was obtained from the patient after a discussion of the risks, benefits and alternatives to treatment. A timeout was performed prior to the initiation of the procedure. Initial ultrasound scanning demonstrates a small amount of ascites within the right upper abdominal quadrant. The right lower abdomen was prepped and draped in the usual sterile fashion. 1% lidocaine was used for local anesthesia. Following this, a 19 gauge Yueh catheter was introduced using ultrasound guidance. An ultrasound image was saved for documentation purposes. The paracentesis was performed. The catheter was removed and a dressing was applied. The patient tolerated the procedure well without immediate post procedural complication. Patient received post-procedure intravenous albumin; see nursing notes for details. FINDINGS: A total of approximately 200 mL of yellow opaque fluid was removed. Samples were sent to the laboratory as requested by the clinical team. IMPRESSION: Successful ultrasound-guided paracentesis yielding 200 mL of peritoneal fluid. Electronically Signed   By: Markus Daft M.D.   On: 06/13/2020 12:57        Scheduled Meds: . apixaban  5 mg Oral BID  . furosemide  20 mg Oral Daily  . metoprolol tartrate  12.5 mg Oral BID  . multivitamin with minerals  1 tablet Oral Daily  . pantoprazole  40 mg Oral Daily   Continuous Infusions: . azithromycin Stopped (06/13/20 1345)  . cefTRIAXone (ROCEPHIN)  IV Stopped (06/13/20 1114)     LOS: 1 day    Time  spent: 33 mins     Wyvonnia Dusky, MD [facility] Pager [phone]  If 7PM-7AM, please contact night-coverage 06/14/2020, 7:34 AM

## 2020-06-14 NOTE — Evaluation (Signed)
Physical Therapy Evaluation Patient Details Name: [name] MRN: [id number] DOB: 09/03/1935 Today's Date: 06/14/2020   History of Present Illness  Pt is an 85 y.o. female with medical history significant for bilateral PE, hypertension, breast cancer status post right lumpectomy currently in remission, atrial fibrillation, GERD, presented to the emergency department for chief concerns of acute weakness.  MD assessment includes: Sepsis, possible pneumonia, cirrhosis, transaminitis, leukocytosis, AKI, lactic acidosis, and HTN.    Clinical Impression  Pt was pleasant and motivated to participate during the session.  Pt found on room air with SpO2 remaining >/= 93% throughout the session with HR WNL.  Pt did not require physical assistance during the session but she did require extra time and effort with functional tasks and was moderately SOB after amb 150', her self-reported max.  Pt reports a history of falls over the last six months and subjectively reported feeling weaker and less able to be as active as she would like to be.  Pt is at risk for further functional decline and falls and will benefit from HHPT services upon discharge to safely address deficits listed in patient problem list.      Follow Up Recommendations Home health PT;Supervision - Intermittent    Equipment Recommendations  Rolling walker with 5" wheels    Recommendations for Other Services       Precautions / Restrictions Precautions Precautions: Fall Restrictions Weight Bearing Restrictions: No      Mobility  Bed Mobility Overal bed mobility: Modified Independent             General bed mobility comments: Extra time and effort with bed mobility tasks    Transfers Overall transfer level: Needs assistance Equipment used: Rolling walker (2 wheeled) Transfers: Sit to/from Stand Sit to Stand: Supervision         General transfer comment: Fair eccentric and concentric control and  stability  Ambulation/Gait Ambulation/Gait assistance: Min guard;Supervision Gait Distance (Feet): 150 Feet Assistive device: Rolling walker (2 wheeled) Gait Pattern/deviations: Step-through pattern;Decreased step length - left;Decreased step length - right;Trunk flexed Gait velocity: mildly decreased   General Gait Details: Min verbal cues for upright posture and amb closer to the [employer]    Modified Rankin (Stroke Patients Only)       Balance Overall balance assessment: Needs assistance Sitting-balance support: No upper extremity supported;Feet supported Sitting balance-Leahy Scale: Normal     Standing balance support: During functional activity;Bilateral upper extremity supported Standing balance-Leahy Scale: Good Standing balance comment: no loss of balance with ambulation                             Pertinent Vitals/Pain Pain Assessment: No/denies pain    Home Living Family/patient expects to be discharged to:: Private residence Living Arrangements: Other (Comment) (Ex-spouse) Available Help at Discharge: Family;Available 24 hours/day Type of Home: Mobile home Home Access: Stairs to enter   Entrance Stairs-Number of Steps: 1 with Bil posts Home Layout: One level Home Equipment: None Additional Comments: Pt reports having walker at home but that it is very old and in disrepair    Prior Function Level of Independence: Independent         Comments: Ind amb limited community distances without an AD, Ind with ADLs, does yardwork, 3 falls in the last 6 months, pt uncertain of cause     Hand Dominance  Extremity/Trunk Assessment   Upper Extremity Assessment Upper Extremity Assessment: Generalized weakness    Lower Extremity Assessment Lower Extremity Assessment: Generalized weakness       Communication   Communication: No difficulties  Cognition Arousal/Alertness: Awake/alert Behavior  During Therapy: WFL for tasks assessed/performed Overall Cognitive Status: Within Functional Limits for tasks assessed                                        General Comments      Exercises Total Joint Exercises Ankle Circles/Pumps: Strengthening;Both;10 reps (with manual resistance) Quad Sets: Strengthening;Both;10 reps Gluteal Sets: Strengthening;Both;10 reps Heel Slides: Strengthening;Both;10 reps Hip ABduction/ADduction: Strengthening;Both;10 reps Straight Leg Raises: Strengthening;Both;10 reps Long Arc Quad: AROM;Both;10 reps;5 reps Knee Flexion: AROM;Both;10 reps;5 reps Marching in Standing: Strengthening;Both;5 reps;Standing Other Exercises Other Exercises: HEP education for BLE APs, QS, GS, and LAQs x 10 each 5-6x/day   Assessment/Plan    PT Assessment Patient needs continued PT services  PT Problem List Decreased strength;Decreased activity tolerance;Decreased balance;Decreased mobility;Decreased knowledge of use of DME       PT Treatment Interventions DME instruction;Gait training;Stair training;Functional mobility training;Therapeutic activities;Therapeutic exercise;Balance training;Patient/family education    PT Goals (Current goals can be found in the Care Plan section)  Acute Rehab PT Goals Patient Stated Goal: To get stronger and walk without falling PT Goal Formulation: With patient Time For Goal Achievement: 06/27/20 Potential to Achieve Goals: Good    Frequency Min 2X/week   Barriers to discharge        Co-evaluation               AM-PAC PT "6 Clicks" Mobility  Outcome Measure Help needed turning from your back to your side while in a flat bed without using bedrails?: None Help needed moving from lying on your back to sitting on the side of a flat bed without using bedrails?: None Help needed moving to and from a bed to a chair (including a wheelchair)?: A Little Help needed standing up from a chair using your arms (e.g.,  wheelchair or bedside chair)?: A Little Help needed to walk in hospital room?: A Little Help needed climbing 3-5 steps with a railing? : A Little 6 Click Score: 20    End of Session Equipment Utilized During Treatment: Gait belt Activity Tolerance: Patient tolerated treatment well Patient left: in chair;with call bell/phone within reach;Other (comment) (No alarms per chart review) Nurse Communication: Mobility status PT Visit Diagnosis: Muscle weakness (generalized) (M62.81);History of falling (Z91.81);Difficulty in walking, not elsewhere classified (R26.2)    Time: 5974-1638 PT Time Calculation (min) (ACUTE ONLY): 36 min   Charges:   PT Evaluation $PT Eval Moderate Complexity: 1 Mod PT Treatments $Therapeutic Exercise: 8-22 mins        D. Scott Laquanta Hummel PT, DPT 06/14/20, 1:34 PM

## 2020-06-15 ENCOUNTER — Encounter: Payer: Self-pay | Admitting: Internal Medicine

## 2020-06-15 DIAGNOSIS — K7469 Other cirrhosis of liver: Secondary | ICD-10-CM | POA: Diagnosis not present

## 2020-06-15 DIAGNOSIS — J189 Pneumonia, unspecified organism: Secondary | ICD-10-CM | POA: Diagnosis not present

## 2020-06-15 DIAGNOSIS — A419 Sepsis, unspecified organism: Secondary | ICD-10-CM | POA: Diagnosis not present

## 2020-06-15 LAB — COMPREHENSIVE METABOLIC PANEL
ALT: 45 U/L — ABNORMAL HIGH (ref 0–44)
AST: 49 U/L — ABNORMAL HIGH (ref 15–41)
Albumin: 1.8 g/dL — ABNORMAL LOW (ref 3.5–5.0)
Alkaline Phosphatase: 135 U/L — ABNORMAL HIGH (ref 38–126)
Anion gap: 6 (ref 5–15)
BUN: 39 mg/dL — ABNORMAL HIGH (ref 8–23)
CO2: 28 mmol/L (ref 22–32)
Calcium: 7.9 mg/dL — ABNORMAL LOW (ref 8.9–10.3)
Chloride: 104 mmol/L (ref 98–111)
Creatinine, Ser: 1.11 mg/dL — ABNORMAL HIGH (ref 0.44–1.00)
GFR, Estimated: 50 mL/min — ABNORMAL LOW (ref >60)
Glucose, Bld: 126 mg/dL — ABNORMAL HIGH (ref 70–99)
Potassium: 4.5 mmol/L (ref 3.5–5.1)
Sodium: 138 mmol/L (ref 135–145)
Total Bilirubin: 0.9 mg/dL (ref 0.3–1.2)
Total Protein: 4.9 g/dL — ABNORMAL LOW (ref 6.5–8.1)

## 2020-06-15 LAB — CBC
HCT: 45 % (ref 36.0–46.0)
Hemoglobin: 15 g/dL (ref 12.0–15.0)
MCH: 31.8 pg (ref 26.0–34.0)
MCHC: 33.3 g/dL (ref 30.0–36.0)
MCV: 95.3 fL (ref 80.0–100.0)
Platelets: 218 10*3/uL (ref 150–400)
RBC: 4.72 MIL/uL (ref 3.87–5.11)
RDW: 15.9 % — ABNORMAL HIGH (ref 11.5–15.5)
WBC: 9.3 10*3/uL (ref 4.0–10.5)
nRBC: 0 % (ref 0.0–0.2)

## 2020-06-15 MED ORDER — POLYETHYLENE GLYCOL 3350 17 G PO PACK
17.0000 g | PACK | Freq: Every day | ORAL | Status: DC
  Administered 2020-06-15: 17 g via ORAL
  Filled 2020-06-15: qty 1

## 2020-06-15 MED ORDER — POTASSIUM CHLORIDE ER 10 MEQ PO TBCR: 10.0000 meq | EXTENDED_RELEASE_TABLET | Freq: Every day | ORAL | 0 refills | Status: DC

## 2020-06-15 MED ORDER — FUROSEMIDE 40 MG PO TABS: 40.0000 mg | ORAL_TABLET | Freq: Every day | ORAL | 0 refills | Status: DC

## 2020-06-15 MED ORDER — DOCUSATE SODIUM 100 MG PO CAPS
200.0000 mg | ORAL_CAPSULE | Freq: Two times a day (BID) | ORAL | Status: DC
  Administered 2020-06-15: 200 mg via ORAL
  Filled 2020-06-15: qty 2

## 2020-06-15 MED ORDER — FUROSEMIDE 20 MG PO TABS: 40.0000 mg | ORAL_TABLET | Freq: Every day | ORAL | 0 refills | Status: DC

## 2020-06-15 MED ORDER — AZITHROMYCIN 250 MG PO TABS: 250.0000 mg | ORAL_TABLET | Freq: Every day | ORAL | 0 refills | Status: DC

## 2020-06-15 MED ORDER — AZITHROMYCIN 250 MG PO TABS: 250.0000 mg | ORAL_TABLET | Freq: Every day | ORAL | 0 refills | Status: AC

## 2020-06-15 NOTE — Progress Notes (Signed)
Mobility Specialist - Progress Note   06/15/20 0944  Mobility  Activity Refused mobility  Mobility performed by Mobility specialist    Pt refused session at this time. States "no, I'm going home".     Sadia Belfiore Mobility Specialist  06/15/20, 9:44 AM

## 2020-06-15 NOTE — TOC Initial Note (Addendum)
Transition of Care [facility]) - Initial/Assessment Note    Patient Details  Name: [name] MRN: [id number] Date of Birth: 1935-11-12  Transition of Care [facility]) CM/SW Contact:    Harriet Masson, RN Phone Number:[phone] 06/15/2020, 10:24 AM  Clinical Narrative:                 Received request for [facility] [place] arrangements for this pt for PT. Spoke with the pt and contacted [facility] for [facility] PT. Agency able to accommodate pt for services however one week out for services to start. Both pt and provider aware of this delay. Pt has sufficient transportation home and to all medical appointments. Uses local pharmacy for all prescription medications and has supportive family to assist as needed. No additional needs at this time.  TOC will continue to follow as needed.  Addendum: DME for 3-1 and RW arranged with [employer] with this request pt requested DME be delivered to her home vs hospital. Spoke with Brenton Grills with this referral.   Expected Discharge Plan: [place] Barriers to Discharge: No Barriers Identified   Patient Goals and CMS Choice     Choice offered to / list presented to : Patient  Expected Discharge Plan and Services Expected Discharge Plan: [place] In-house Referral: Clinical Social Work   Post Acute Care Choice: NA                             HH Arranged: PCS/Personal Care Services,PT [facility] Agency: [place] Date [place]: 06/15/20 Time HH Agency Contacted: 59 Representative spoke with at [place]: [name]  Prior Living Arrangements/Services   Lives with:: Spouse Patient language and need for interpreter reviewed:: Yes Do you feel safe going back to the place where you live?: Yes      Need for Family Participation in Patient Care: Yes (Comment) Care giver support system in place?: Yes (comment)   Criminal Activity/Legal Involvement Pertinent to Current Situation/Hospitalization: No  - Comment as needed  Activities of Daily Living Home Assistive Devices/Equipment: Eyeglasses ADL Screening (condition at time of admission) Patient's cognitive ability adequate to safely complete daily activities?: Yes Is the patient deaf or have difficulty hearing?: No Does the patient have difficulty seeing, even when wearing glasses/contacts?: No Does the patient have difficulty concentrating, remembering, or making decisions?: No Patient able to express need for assistance with ADLs?: Yes Does the patient have difficulty dressing or bathing?: No Independently performs ADLs?: Yes (appropriate for developmental age) Does the patient have difficulty walking or climbing stairs?: No Weakness of Legs: Both Weakness of Arms/Hands: Both  Permission Sought/Granted   Permission granted to share information with : Yes, Verbal Permission Granted              Emotional Assessment Appearance:: Appears stated age Attitude/Demeanor/Rapport: Engaged Affect (typically observed): Accepting Orientation: : Oriented to Self,Oriented to Place,Oriented to  Time,Oriented to Situation Alcohol / Substance Use: Not Applicable Psych Involvement: No (comment)  Admission diagnosis:  Pleural effusion [J90] Tachycardia [R00.0] Generalized abdominal pain [R10.84] Lactic acid acidosis [E87.2] Ascites [R18.8] Sepsis ([place]) [A41.9] Leukocytosis, unspecified type [D72.829] Diarrhea, unspecified type [R19.7] Sepsis, due to unspecified organism, unspecified whether acute organ dysfunction present [facility]) [A41.9] Patient Active Problem List   Diagnosis Date Noted  . Sepsis ([place]) 06/12/2020  . Acute pulmonary embolism ([place]) 06/02/2020  . Breast cancer ([place]) 11/02/2014  . Breast CA ([place]) 01/19/2013  PCP:  Sallee Lange, NP Pharmacy:   [facility], [place], [place] [address] [place] [address] Phone: [phone] Fax: [place], [place] - [address] [place] [address] Phone: [phone] Fax: [phone]     Social Determinants of Health (SDOH) Interventions    Readmission Risk Interventions No flowsheet data found.

## 2020-06-15 NOTE — Discharge Summary (Signed)
Physician Discharge Summary  [name] ZOX:[id number] DOB: 1935/10/22 DOA: 06/12/2020  PCP: Sallee Lange, NP  Admit date: 06/12/2020 Discharge date: 06/15/2020  Admitted From: home  Disposition:  Home w/ home health   Recommendations for Outpatient Follow-up:  1. Follow up with PCP in 1-2 weeks 2. F/u w/ GI in 1 week   Home Health: yes Equipment/Devices:  Discharge Condition: stable  CODE STATUS: full  Diet recommendation: Heart Healthy   Brief/Interim Summary: HPI was taken from Dr. Tobie Poet: [name] is a 85 y.o. female with medical history significant for hypertension, breast cancer status post right lumpectomy currently in remission, atrial fibrillation, GERD, presented to the emergency department for chief concerns of acute weakness.  Patient reports that she felt generalized weakness for the last 2 days.  She endorses profound abdominal pain and thought that she needed to use the restroom. She was on her way to the restroom and after having a bowel movement in the restroom, she felt profound weakness and had difficulty standing up.  She pounded on the door so that her roommate can assist her and called EMS.  She reports that her roommate called EMS and she took a very long time to get herself dressed before waiting for EMS to arrive.  She states that she has never felt this profound weakness before.  She denies fever at home.  She reports that she feels a little better in the emergency department with the treatments given.  She reports she was recently admitted to the hospital for UTI.  On chart review patient had bilateral acute PE.  She was started on heparin gtt. and discharged home with Eliquis.  ROS: Constitutional: no weight change, no fever ENT/Mouth: no sore throat, no rhinorrhea Eyes: no eye pain, no vision changes Cardiovascular: no chest pain, no dyspnea, no edema, no palpitations Respiratory: + cough, no sputum, no wheezing Gastrointestinal: no  nausea, no vomiting, no diarrhea, no constipation Genitourinary: no urinary incontinence, no dysuria, no hematuria Musculoskeletal: no arthralgias, no myalgias Skin: no skin lesions, no pruritus, Neuro:+weakness, no loss of consciousness, no syncope Psych: no anxiety, no depression,+decrease appetite Heme/Lymph: no bruising, no bleeding  ED Course: Discussed with ED provider, patient requires hospitalization due to possible sepsis.  ED provider ordered CT abdomen and pelvis without contrast and was read as liver compatible with cirrhosis.  Stigmata of portal venous hypertension and is noted including varices and ascites.  There has been increased interval volume ascites within the abdomen and pelvis.  She endorses new small bilateral pleural effusions.  CT also read nonobstructing left renal calculus.   Hospital Course from Dr. Jimmye Norman 1/13-1/15/22: Pt presented w/ sepsis likely secondary to pneumonia. Pt was treated w/ IV ceftriaxone, azithromycin, bronchodilators and incentive spirometry. Of note, pt has cirrhosis w/ ascites and significant b/l LE edema. Pt was receiving lasix while inpatient and was d/c with it as well. Pt had a paracentesis and fluid studies done. Ascitic fluid cx NTGD and ascitic fluid path was pending at the time of d/c. It was highly recommended that pt see outpatient GI within 1 week to further evaluate & treat cirrhosis. Pt's daughter verbalized her understanding as pt often has difficulty understanding her medical problems.   Discharge Diagnoses:  Active Problems:   Sepsis ([place])   Sepsis: meets criteria w/ tachypnea, leukocytosis, elevated lactic acid and likely pneumonia. Continue on IV ceftriaxone, azithromycin. Blood cxs NGTD  Possible pneumonia: improving slowly. Continue on IV azithromycin, ceftriaxone & bronchodilators. Encourage incentive  spirometry   Cirrhosis: w/ ascites as per CT abd/pelvis. Etiology unclear, possibly NASH. Will continue on lasix.  Monitor I/Os. S/p paracentesis 06/13/20 & fluid studies are pending. Will need to see GI as an outpatient    Transaminitis: likely secondary to cirrhosis   Leukocytosis: resolved  AKI: resolved   Lactic acidosis: resolved   Hx of b/l PE: continue on eliquis   HTN: continue on metoprolol, lasix  Discharge Instructions  Discharge Instructions    Diet - low sodium heart healthy   Complete by: As directed    Discharge instructions   Complete by: As directed    F/u PCP in 1-2 weeks. F/u w/ GI in 1 week for cirrhosis   Increase activity slowly   Complete by: As directed      Allergies as of 06/15/2020      Reactions   Gabapentin Itching   Ivp Dye [iodinated Diagnostic Agents] Itching      Medication List    TAKE these medications   albuterol 108 (90 Base) MCG/ACT inhaler Commonly known as: VENTOLIN HFA Inhale 2 puffs into the lungs every 6 (six) hours as needed for wheezing or shortness of breath.   apixaban 5 MG Tabs tablet Commonly known as: ELIQUIS Take 10 mg by mouth 2 (two) times daily.   apixaban 5 MG Tabs tablet Commonly known as: ELIQUIS Take 1 tablet (5 mg total) by mouth 2 (two) times daily.   azithromycin 250 MG tablet Commonly known as: Zithromax Take 1 tablet (250 mg total) by mouth daily for 3 days.   calcium carbonate 1250 (500 Ca) MG chewable tablet Commonly known as: OS-CAL Chew 1 tablet by mouth daily.   Centrum Silver 50+Women Tabs Take 1 tablet by mouth daily.   Cholecalciferol 25 MCG (1000 UT) tablet Take 1,000 Units by mouth daily.   furosemide 40 MG tablet Commonly known as: Lasix Take 1 tablet (40 mg total) by mouth daily. What changed:   medication strength  how much to take   meloxicam 15 MG tablet Commonly known as: MOBIC Take 15 mg by mouth daily as needed for pain.   metoprolol tartrate 25 MG tablet Commonly known as: LOPRESSOR Take 0.5 tablets (12.5 mg total) by mouth 2 (two) times daily.   pantoprazole 40 MG  tablet Commonly known as: PROTONIX Take 1 tablet (40 mg total) by mouth daily.   potassium chloride 10 MEQ tablet Commonly known as: KLOR-CON Take 1 tablet (10 mEq total) by mouth daily.            Durable Medical Equipment  (From admission, onward)         Start     Ordered   06/15/20 0733  For home use only DME Walker rolling  Once       Question Answer Comment  Walker: With 5 Inch Wheels   Patient needs a walker to treat with the following condition Generalized weakness      06/15/20 0733   06/15/20 0733  For home use only DME 3 n 1  Once        06/15/20 0733          Allergies  Allergen Reactions  . Gabapentin Itching  . Ivp Dye [Iodinated Diagnostic Agents] Itching    Consultations:     Procedures/Studies: CT ABDOMEN PELVIS WO CONTRAST  Result Date: 06/12/2020 CLINICAL DATA:  Abdominal pain EXAM: CT ABDOMEN AND PELVIS WITHOUT CONTRAST TECHNIQUE: Multidetector CT imaging of the abdomen and pelvis was performed following  the standard protocol without IV contrast. COMPARISON:  06/01/2020 FINDINGS: Lower chest: Small bilateral pleural effusions appear new from previous exam. Cardiac enlargement. Aortic atherosclerosis and coronary artery calcifications identified. Soft tissue lesion with adjacent surgical clips noted in the lateral right breast, image 3/2. Hepatobiliary: The liver is atrophic and cirrhotic with a diffuse nodular contour. Within the limitations of unenhanced technique there is no focal liver abnormality. Previous cholecystectomy. No signs of biliary dilatation. Pancreas: Unremarkable. No pancreatic ductal dilatation or surrounding inflammatory changes. Spleen: Normal in size without focal abnormality. Adrenals/Urinary Tract: Normal appearance of the adrenal glands. Bilateral renal sinus cysts are again noted. Nonobstructing left renal calculi measures 3 mm, image 42/2. Exophytic lesion arising off the inferior pole of right kidney anteriorly is  incompletely characterized without IV contrast measuring 1.7 cm, image 48/2. Urinary bladder appears unremarkable. Stomach/Bowel: Stomach is unremarkable. The appendix is visualized and appears normal. No bowel wall thickening, inflammation or distension. Colonic diverticulosis noted. Vascular/Lymphatic: Aortic atherosclerosis. Upper abdominal varices. No abdominopelvic adenopathy. Reproductive: Uterus and bilateral adnexa are unremarkable. Other: There is been interval increase in volume of abdominal and pelvic ascites when compared with 06/01/2020. No focal fluid collection identified. The calcified lesion within the anterior aspect of the upper abdomen is unchanged measuring 1.8 cm, image 48/2. Favor benign abnormality. Musculoskeletal: Status post solid fusion of L3 through L5. Bone graft harvesting sites identified within the left iliac bone. No acute or suspicious osseous findings. IMPRESSION: 1. Morphologic features of the liver compatible with cirrhosis. Stigmata of portal venous hypertension is noted including varices and ascites. When compared with the previous exam there is been interval increase in volume of ascites within the abdomen and pelvis. 2. New small bilateral pleural effusions. 3. Aortic atherosclerosis and coronary artery calcifications. 4. Nonobstructing left renal calculus. Aortic Atherosclerosis (ICD10-I70.0). Electronically Signed   By: Kerby Moors M.D.   On: 06/12/2020 12:55   CT ABDOMEN PELVIS WO CONTRAST  Result Date: 06/01/2020 CLINICAL DATA:  Cough, nausea, abdominal pain EXAM: CT CHEST, ABDOMEN AND PELVIS WITHOUT CONTRAST TECHNIQUE: Multidetector CT imaging of the chest, abdomen and pelvis was performed following the standard protocol without IV contrast. COMPARISON:  CT 10/08/2015, 12/29/2010 FINDINGS: CT CHEST FINDINGS Cardiovascular: Heart size is mildly enlarged. Trace pericardial effusion. Thoracic aorta is nonaneurysmal. Scattered atherosclerotic calcification of the  aorta and coronary arteries. Main pulmonary trunk is nondilated. Mediastinum/Nodes: Surgical clips in the right breast and right axilla. No no axillary lymphadenopathy. Borderline prominent precarinal node measuring 10 mm short axis (series 2, image 52), unchanged from 2012. No evidence of hilar lymphadenopathy by noncontrast CT. Unchanged coarse calcification within the thyroid gland. Trachea and esophagus within normal limits. Lungs/Pleura: Bibasilar scarring or atelectasis. Lungs are otherwise clear. No pleural effusion or pneumothorax. Musculoskeletal: Right breast skin thickening and lumpectomy changes. Degenerative changes throughout the thoracic spine. No acute osseous findings. CT ABDOMEN PELVIS FINDINGS Hepatobiliary: Nodular hepatic surface contour compatible with cirrhosis. No focal liver lesion evident on noncontrast imaging. Prior cholecystectomy. No biliary dilatation. Pancreas: Unremarkable. No pancreatic ductal dilatation or surrounding inflammatory changes. Spleen: Upper limits of normal in size. Adrenals/Urinary Tract: Adrenal glands within normal limits. Bilateral renal sinus cysts. No renal stone or hydronephrosis. Urinary bladder is partially decompressed, limiting its evaluation. Stomach/Bowel: Stomach is within normal limits. Scattered colonic diverticulosis. No evidence of bowel wall thickening, distention, or inflammatory changes. Vascular/Lymphatic: Aortoiliac atherosclerosis without aneurysm. Upper abdominal varices. No abdominopelvic lymphadenopathy. Reproductive: Uterus and bilateral adnexa are unremarkable. Other: Small volume free fluid within the  pelvis. Trace perihepatic ascites. No organized abdominopelvic fluid collection. No pneumoperitoneum. Unchanged peripherally calcified 1.8 x 1.7 cm structure within the anterior aspect of the upper abdomen abutting the peritoneal surface (series 2, image 37). Musculoskeletal: L3-L5 spinal fusion. Degenerative changes within the lumbar spine  and bilateral hips. No acute osseous findings. IMPRESSION: 1. No acute findings within the chest, abdomen, or pelvis. 2. Cirrhosis with evidence of portal hypertension including upper abdominal varices and small volume ascites. 3. Colonic diverticulosis without evidence of acute diverticulitis. 4. Aortic and coronary artery atherosclerosis.  (ICD10-I70.0). 5. Postsurgical changes within the right breast and axilla. Electronically Signed   By: Davina Poke D.O.   On: 06/01/2020 17:10   DG Chest 1 View  Result Date: 06/12/2020 CLINICAL DATA:  Right upper quadrant pain. EXAM: CHEST  1 VIEW COMPARISON:  10/11/2015 FINDINGS: Patchy opacity at the right base compatible with atelectasis or infiltrate. Streaky/linear density at the left base suggest atelectasis. The cardio pericardial silhouette is enlarged. Bones are diffusely demineralized. IMPRESSION: Bibasilar atelectasis or infiltrate, right greater than left. Electronically Signed   By: Misty Stanley M.D.   On: 06/12/2020 14:57   DG Ankle 2 Views Left  Result Date: 06/01/2020 CLINICAL DATA:  Fall 3 weeks ago.  Pain, swelling EXAM: LEFT ANKLE - 2 VIEW COMPARISON:  None. FINDINGS: Diffuse soft tissue swelling. No acute bony abnormality. Specifically, no fracture, subluxation, or dislocation. IMPRESSION: No acute bony abnormality. Electronically Signed   By: Rolm Baptise M.D.   On: 06/01/2020 13:20   CT Head Wo Contrast  Result Date: 06/01/2020 CLINICAL DATA:  Headache.  Rule out intracranial hemorrhage EXAM: CT HEAD WITHOUT CONTRAST TECHNIQUE: Contiguous axial images were obtained from the base of the skull through the vertex without intravenous contrast. COMPARISON:  CT head 07/09/2019 FINDINGS: Brain: Ventricle size normal. Patchy white matter hypodensity bilaterally, stable. Negative for acute infarct, hemorrhage, mass. Vascular: Negative for hyperdense vessel Skull: Negative Sinuses/Orbits: Paranasal sinuses clear. Bilateral cataract extraction Other:  None IMPRESSION: No acute abnormality no change from the prior CT. Chronic white matter changes consistent with microvascular ischemia. Electronically Signed   By: Franchot Gallo M.D.   On: 06/01/2020 17:01   CT Chest Wo Contrast  Result Date: 06/01/2020 CLINICAL DATA:  Cough, nausea, abdominal pain EXAM: CT CHEST, ABDOMEN AND PELVIS WITHOUT CONTRAST TECHNIQUE: Multidetector CT imaging of the chest, abdomen and pelvis was performed following the standard protocol without IV contrast. COMPARISON:  CT 10/08/2015, 12/29/2010 FINDINGS: CT CHEST FINDINGS Cardiovascular: Heart size is mildly enlarged. Trace pericardial effusion. Thoracic aorta is nonaneurysmal. Scattered atherosclerotic calcification of the aorta and coronary arteries. Main pulmonary trunk is nondilated. Mediastinum/Nodes: Surgical clips in the right breast and right axilla. No no axillary lymphadenopathy. Borderline prominent precarinal node measuring 10 mm short axis (series 2, image 52), unchanged from 2012. No evidence of hilar lymphadenopathy by noncontrast CT. Unchanged coarse calcification within the thyroid gland. Trachea and esophagus within normal limits. Lungs/Pleura: Bibasilar scarring or atelectasis. Lungs are otherwise clear. No pleural effusion or pneumothorax. Musculoskeletal: Right breast skin thickening and lumpectomy changes. Degenerative changes throughout the thoracic spine. No acute osseous findings. CT ABDOMEN PELVIS FINDINGS Hepatobiliary: Nodular hepatic surface contour compatible with cirrhosis. No focal liver lesion evident on noncontrast imaging. Prior cholecystectomy. No biliary dilatation. Pancreas: Unremarkable. No pancreatic ductal dilatation or surrounding inflammatory changes. Spleen: Upper limits of normal in size. Adrenals/Urinary Tract: Adrenal glands within normal limits. Bilateral renal sinus cysts. No renal stone or hydronephrosis. Urinary bladder is partially decompressed, limiting  its evaluation. Stomach/Bowel:  Stomach is within normal limits. Scattered colonic diverticulosis. No evidence of bowel wall thickening, distention, or inflammatory changes. Vascular/Lymphatic: Aortoiliac atherosclerosis without aneurysm. Upper abdominal varices. No abdominopelvic lymphadenopathy. Reproductive: Uterus and bilateral adnexa are unremarkable. Other: Small volume free fluid within the pelvis. Trace perihepatic ascites. No organized abdominopelvic fluid collection. No pneumoperitoneum. Unchanged peripherally calcified 1.8 x 1.7 cm structure within the anterior aspect of the upper abdomen abutting the peritoneal surface (series 2, image 37). Musculoskeletal: L3-L5 spinal fusion. Degenerative changes within the lumbar spine and bilateral hips. No acute osseous findings. IMPRESSION: 1. No acute findings within the chest, abdomen, or pelvis. 2. Cirrhosis with evidence of portal hypertension including upper abdominal varices and small volume ascites. 3. Colonic diverticulosis without evidence of acute diverticulitis. 4. Aortic and coronary artery atherosclerosis.  (ICD10-I70.0). 5. Postsurgical changes within the right breast and axilla. Electronically Signed   By: Davina Poke D.O.   On: 06/01/2020 17:10   CT Angio Chest PE W and/or Wo Contrast  Result Date: 06/02/2020 CLINICAL DATA:  Chest pain and tachycardia EXAM: CT ANGIOGRAPHY CHEST WITH CONTRAST TECHNIQUE: Multidetector CT imaging of the chest was performed using the standard protocol during bolus administration of intravenous contrast. Multiplanar CT image reconstructions and MIPs were obtained to evaluate the vascular anatomy. CONTRAST:  14mL OMNIPAQUE IOHEXOL 350 MG/ML SOLN COMPARISON:  06/01/2020 chest CT FINDINGS: Cardiovascular: Contrast injection is sufficient to demonstrate satisfactory opacification of the pulmonary arteries to the segmental level. There are small segmental emboli in the right lower lobe (5:194, 214) and within a segmental branch of the left lower  lobe (10:75). No evidence of right heart strain. The size of the main pulmonary artery is normal. Heart size is normal, with no pericardial effusion. The course and caliber of the aorta are normal. There is aortic atherosclerotic calcification. Opacification decreased due to pulmonary arterial phase contrast bolus timing. Mediastinum/Nodes: No mediastinal, hilar or axillary lymphadenopathy. Normal visualized thyroid. Thoracic esophageal course is normal. Lungs/Pleura: Airways are patent. No pleural effusion, lobar consolidation, pneumothorax or pulmonary infarction. Upper Abdomen: Contrast bolus timing is not optimized for evaluation of the abdominal organs. Hepatic cirrhosis. Musculoskeletal: No chest wall abnormality. No bony spinal canal stenosis. Review of the MIP images confirms the above findings. IMPRESSION: 1. Small segmental pulmonary emboli in the right and left lower lobes. 2. Hepatic cirrhosis. Aortic Atherosclerosis (ICD10-I70.0). Critical Value/emergent results were called by telephone at the time of interpretation on 06/02/2020 at 3:37 am to provider [facility] , who verbally acknowledged these results. Electronically Signed   By: Ulyses Jarred M.D.   On: 06/02/2020 03:38   US Venous Img Lower Unilateral Left  Result Date: 06/01/2020 CLINICAL DATA:  Lower extremity pain and edema EXAM: Left LOWER EXTREMITY VENOUS DOPPLER ULTRASOUND TECHNIQUE: Gray-scale sonography with compression, as well as color and duplex ultrasound, were performed to evaluate the deep venous system(s) from the level of the common femoral vein through the popliteal and proximal calf veins. COMPARISON:  None. FINDINGS: VENOUS Normal compressibility of the common femoral, superficial femoral, and popliteal veins, as well as the visualized calf veins. Visualized portions of profunda femoral vein and great saphenous vein unremarkable. No filling defects to suggest DVT on grayscale or color Doppler imaging. Doppler waveforms show  normal direction of venous flow, normal respiratory plasticity and response to augmentation. Limited views of the contralateral common femoral vein are unremarkable. OTHER None. Limitations: none IMPRESSION: Negative. Electronically Signed   By: Donavan Foil M.D.   On:  06/01/2020 17:18   US Paracentesis  Result Date: 06/13/2020 INDICATION: 85 year old with cirrhosis and ascites. Request for diagnostic paracentesis. EXAM: ULTRASOUND GUIDED DIAGNOSTIC PARACENTESIS MEDICATIONS: None. COMPLICATIONS: None immediate. PROCEDURE: Informed written consent was obtained from the patient after a discussion of the risks, benefits and alternatives to treatment. A timeout was performed prior to the initiation of the procedure. Initial ultrasound scanning demonstrates a small amount of ascites within the right upper abdominal quadrant. The right lower abdomen was prepped and draped in the usual sterile fashion. 1% lidocaine was used for local anesthesia. Following this, a 19 gauge Yueh catheter was introduced using ultrasound guidance. An ultrasound image was saved for documentation purposes. The paracentesis was performed. The catheter was removed and a dressing was applied. The patient tolerated the procedure well without immediate post procedural complication. Patient received post-procedure intravenous albumin; see nursing notes for details. FINDINGS: A total of approximately 200 mL of yellow opaque fluid was removed. Samples were sent to the laboratory as requested by the clinical team. IMPRESSION: Successful ultrasound-guided paracentesis yielding 200 mL of peritoneal fluid. Electronically Signed   By: Markus Daft M.D.   On: 06/13/2020 12:57   ECHOCARDIOGRAM COMPLETE  Result Date: 06/04/2020    ECHOCARDIOGRAM REPORT   Patient Name:   CALEDONIA ZOU Date of Exam: 06/03/2020 Medical Rec #:  [id number]      Height:       67.0 in Accession #:    [id number]     Weight:       165.0 lb Date of Birth:  [birth year]      BSA:           1.863 m Patient Age:    8 years       BP:           123/81 mmHg Patient Gender: F              HR:           51 bpm. Exam Location:  [facility] Procedure: 2D Echo, Cardiac Doppler and Color Doppler Indications:     Bacteremia R78.81  History:         Patient has no prior history of Echocardiogram examinations.  Sonographer:     Alyse Low Roar Referring Phys:  [id number] AMRIT ADHIKARI Diagnosing Phys: Nelva Bush MD IMPRESSIONS  1. Left ventricular ejection fraction, by estimation, is 55 to 60%. The left ventricle has normal function. Left ventricular endocardial border not optimally defined to evaluate regional wall motion. Left ventricular diastolic parameters are indeterminate.  2. Right ventricular systolic function is normal. The right ventricular size is normal. Moderately increased right ventricular wall thickness.  3. Left atrial size was mildly dilated.  4. Right atrial size was mildly dilated.  5. The mitral valve was not well visualized. Mild mitral valve regurgitation.  6. The aortic valve is tricuspid. Aortic valve regurgitation is trivial. No aortic stenosis is present. FINDINGS  Left Ventricle: Left ventricular ejection fraction, by estimation, is 55 to 60%. The left ventricle has normal function. Left ventricular endocardial border not optimally defined to evaluate regional wall motion. The left ventricular internal cavity size was normal in size. There is borderline left ventricular hypertrophy. Left ventricular diastolic parameters are indeterminate. Right Ventricle: The right ventricular size is normal. Moderately increased right ventricular wall thickness. Right ventricular systolic function is normal. Left Atrium: Left atrial size was mildly dilated. Right Atrium: Right atrial size was mildly dilated. Pericardium: Trivial pericardial effusion is present. Mitral Valve: The mitral  valve was not well visualized. Mild mitral valve regurgitation. Tricuspid Valve: The tricuspid valve is grossly normal.  Tricuspid valve regurgitation is mild. Aortic Valve: The aortic valve is tricuspid. Aortic valve regurgitation is trivial. No aortic stenosis is present. Aortic valve peak gradient measures 4.2 mmHg. Pulmonic Valve: The pulmonic valve was grossly normal. Pulmonic valve regurgitation is mild. No evidence of pulmonic stenosis. Aorta: The aortic root is normal in size and structure. Venous: The inferior vena cava was not well visualized. IAS/Shunts: The interatrial septum was not well visualized.  LEFT VENTRICLE PLAX 2D LVIDd:         4.40 cm  Diastology LVIDs:         3.30 cm  LV e' medial:    7.83 cm/s LV PW:         1.00 cm  LV E/e' medial:  12.2 LV IVS:        1.00 cm  LV e' lateral:   10.40 cm/s LVOT diam:     2.00 cm  LV E/e' lateral: 9.2 LVOT Area:     3.14 cm  RIGHT VENTRICLE RV Mid diam:    3.10 cm RV S prime:     10.10 cm/s LEFT ATRIUM             Index       RIGHT ATRIUM           Index LA diam:        3.85 cm 2.07 cm/m  RA Area:     21.90 cm LA Vol (A2C):   47.6 ml 25.54 ml/m RA Volume:   61.90 ml  33.22 ml/m LA Vol (A4C):   48.6 ml 26.08 ml/m LA Biplane Vol: 48.6 ml 26.08 ml/m  AORTIC VALVE                PULMONIC VALVE AV Area (Vmax): 2.19 cm    PV Vmax:        0.68 m/s AV Vmax:        102.00 cm/s PV Peak grad:   1.8 mmHg AV Peak Grad:   4.2 mmHg    RVOT Peak grad: 1 mmHg LVOT Vmax:      71.10 cm/s  AORTA Ao Root diam: 3.10 cm MITRAL VALVE               TRICUSPID VALVE MV Area (PHT): 3.27 cm    TR Peak grad:   17.0 mmHg MV Decel Time: 232 msec    TR Vmax:        206.00 cm/s MV E velocity: 95.50 cm/s                            SHUNTS                            Systemic Diam: 2.00 cm Nelva Bush MD Electronically signed by Nelva Bush MD Signature Date/Time: 06/04/2020/12:47:25 PM    Final        Subjective: Pt c/o b/l leg foot swelling    Discharge Exam: Vitals:   06/15/20 0440 06/15/20 1129  BP: (!) 107/42 115/72  Pulse: 90   Resp: 16 16  Temp: 97.9 F (36.6 C) 97.8 F (36.6  C)  SpO2: 94% 95%   Vitals:   06/14/20 1945 06/14/20 2322 06/15/20 0440 06/15/20 1129  BP: (!) 120/53 118/60 (!) 107/42 115/72  Pulse: 84 85 90  Resp: 16 16 16 16   Temp: 98.2 F (36.8 C) 97.8 F (36.6 C) 97.9 F (36.6 C) 97.8 F (36.6 C)  TempSrc: Oral Oral Oral   SpO2: 93% 95% 94% 95%  Weight:      Height:        General: Pt is alert, awake, not in acute distress Cardiovascular:  S1/S2 +, no rubs, no gallops Respiratory: CTA bilaterally, no wheezing, no rhonchi Abdominal: Soft, NT, ND, bowel sounds + Extremities: 3+ pitting edema of b/l LE, no cyanosis    The results of significant diagnostics from this hospitalization (including imaging, microbiology, ancillary and laboratory) are listed below for reference.     Microbiology: Recent Results (from the past 240 hour(s))  Urine culture     Status: None   Collection Time: 06/12/20  3:00 PM   Specimen: Urine, Random  Result Value Ref Range Status   Specimen Description   Final    URINE, RANDOM Performed at [facility], [address] [place], [place] [address]    Special Requests   Final    NONE Performed at [facility], [address], [place], [place] [address]    Culture   Final    NO GROWTH Performed at [facility], [place] [address], [place], [place] [address]    Report Status 06/14/2020 FINAL  Final  Resp Panel by RT-PCR (Flu A&B, Covid) Nasopharyngeal Swab     Status: None   Collection Time: 06/12/20  3:35 PM   Specimen: Nasopharyngeal Swab; Nasopharyngeal(NP) swabs in vial transport medium  Result Value Ref Range Status   SARS Coronavirus 2 by RT PCR NEGATIVE NEGATIVE Final    Comment: (NOTE) SARS-CoV-2 target nucleic acids are NOT DETECTED.  The SARS-CoV-2 RNA is generally detectable in upper respiratory specimens during the acute phase of infection. The lowest concentration of SARS-CoV-2 viral copies this assay can detect is 138 copies/mL. A negative result does not  preclude SARS-Cov-2 infection and should not be used as the sole basis for treatment or other patient management decisions. A negative result may occur with  improper specimen collection/handling, submission of specimen other than nasopharyngeal swab, presence of viral mutation(s) within the areas targeted by this assay, and inadequate number of viral copies(<138 copies/mL). A negative result must be combined with clinical observations, patient history, and epidemiological information. The expected result is Negative.  Fact Sheet for Patients:  EntrepreneurPulse.com.au  Fact Sheet for Healthcare Providers:  IncredibleEmployment.be  This test is no t yet approved or cleared by the [place] FDA and  has been authorized for detection and/or diagnosis of SARS-CoV-2 by FDA under an Emergency Use Authorization (EUA). This EUA will remain  in effect (meaning this test can be used) for the duration of the COVID-19 declaration under Section 564(b)(1) of the Act, 21 U.S.C.section 360bbb-3(b)(1), unless the authorization is terminated  or revoked sooner.       Influenza A by PCR NEGATIVE NEGATIVE Final   Influenza B by PCR NEGATIVE NEGATIVE Final    Comment: (NOTE) The Xpert Xpress SARS-CoV-2/FLU/RSV plus assay is intended as an aid in the diagnosis of influenza from Nasopharyngeal swab specimens and should not be used as a sole basis for treatment. Nasal washings and aspirates are unacceptable for Xpert Xpress SARS-CoV-2/FLU/RSV testing.  Fact Sheet for Patients: EntrepreneurPulse.com.au  Fact Sheet for Healthcare Providers: IncredibleEmployment.be  This test is not yet approved or cleared by the [place] FDA and has been authorized for detection and/or diagnosis of  SARS-CoV-2 by FDA under an Emergency Use Authorization (EUA). This EUA will remain in effect (meaning this test can be used) for the  duration of the COVID-19 declaration under Section 564(b)(1) of the Act, 21 U.S.C. section 360bbb-3(b)(1), unless the authorization is terminated or revoked.  Performed at [facility], [address] [place], [place] [address]   CULTURE, BLOOD (ROUTINE X 2) w Reflex to ID Panel     Status: None (Preliminary result)   Collection Time: 06/13/20 12:56 AM   Specimen: BLOOD  Result Value Ref Range Status   Specimen Description BLOOD RIGHT HAND  Final   Special Requests   Final    BOTTLES DRAWN AEROBIC AND ANAEROBIC Blood Culture results may not be optimal due to an inadequate volume of blood received in culture bottles   Culture   Final    NO GROWTH 2 DAYS Performed at [facility], [address], [place], [place] [address]    Report Status PENDING  Incomplete  CULTURE, BLOOD (ROUTINE X 2) w Reflex to ID Panel     Status: None (Preliminary result)   Collection Time: 06/13/20 12:56 AM   Specimen: BLOOD  Result Value Ref Range Status   Specimen Description BLOOD LEFT HAND  Final   Special Requests   Final    BOTTLES DRAWN AEROBIC AND ANAEROBIC Blood Culture results may not be optimal due to an inadequate volume of blood received in culture bottles   Culture   Final    NO GROWTH 2 DAYS Performed at [facility], [address] [place], [place] [address]    Report Status PENDING  Incomplete  Gastrointestinal Panel by PCR , Stool     Status: None   Collection Time: 06/13/20  8:38 AM   Specimen: Stool  Result Value Ref Range Status   Campylobacter species NOT DETECTED NOT DETECTED Final   Plesimonas shigelloides NOT DETECTED NOT DETECTED Final   Salmonella species NOT DETECTED NOT DETECTED Final   Yersinia enterocolitica NOT DETECTED NOT DETECTED Final   Vibrio species NOT DETECTED NOT DETECTED Final   Vibrio cholerae NOT DETECTED NOT DETECTED Final   Enteroaggregative E coli (EAEC) NOT DETECTED NOT DETECTED Final   Enteropathogenic E coli (EPEC) NOT  DETECTED NOT DETECTED Final   Enterotoxigenic E coli (ETEC) NOT DETECTED NOT DETECTED Final   Shiga like toxin producing E coli (STEC) NOT DETECTED NOT DETECTED Final   Shigella/Enteroinvasive E coli (EIEC) NOT DETECTED NOT DETECTED Final   Cryptosporidium NOT DETECTED NOT DETECTED Final   Cyclospora cayetanensis NOT DETECTED NOT DETECTED Final   Entamoeba histolytica NOT DETECTED NOT DETECTED Final   Giardia lamblia NOT DETECTED NOT DETECTED Final   Adenovirus F40/41 NOT DETECTED NOT DETECTED Final   Astrovirus NOT DETECTED NOT DETECTED Final   Norovirus GI/GII NOT DETECTED NOT DETECTED Final   Rotavirus A NOT DETECTED NOT DETECTED Final   Sapovirus (I, II, IV, and V) NOT DETECTED NOT DETECTED Final    Comment: Performed at [facility], [address] [place], [place] [address]  C Difficile Quick Screen w PCR reflex     Status: None   Collection Time: 06/13/20  8:38 AM   Specimen: Stool  Result Value Ref Range Status   C Diff antigen NEGATIVE NEGATIVE Final   C Diff toxin NEGATIVE NEGATIVE Final   C Diff interpretation No C. difficile detected.  Final    Comment: Performed at [facility], [address] [place],  [address]  Body fluid culture     Status: None (Preliminary result)   Collection Time: 06/13/20 12:40 PM   Specimen: PATH Cytology Peritoneal fluid  Result Value Ref Range Status   Specimen Description   Final    PERITONEAL Performed at [facility], [address], [place], [place] [address]    Special Requests   Final    NONE Performed at [facility], [address] [place], [place] [address]    Gram Stain   Final    RARE WBC PRESENT, PREDOMINANTLY MONONUCLEAR NO ORGANISMS SEEN    Culture   Final    NO GROWTH 2 DAYS Performed at [facility], [place] [address], [place], [place] [address]    Report Status PENDING  Incomplete     Labs: BNP (last 3 results) Recent Labs    06/01/20 1534  BNP 137.6*    Basic Metabolic Panel: Recent Labs  Lab 06/12/20 1323 06/13/20 0838 06/14/20 0229 06/15/20 0957  NA 141 140 139 138  K 4.1 4.2 4.0 4.5  CL 106 105 106 104  CO2 23 27 26 28   GLUCOSE 120* 93 95 126*  BUN 40* 37* 35* 39*  CREATININE 1.04* 1.01* 0.99 1.11*  CALCIUM 8.1* 7.9* 7.6* 7.9*   Liver Function Tests: Recent Labs  Lab 06/12/20 1323 06/13/20 0838 06/14/20 0229 06/15/20 0957  AST 70* 54* 54* 49*  ALT 58* 50* 46* 45*  ALKPHOS 150* 133* 124 135*  BILITOT 1.2 1.2 0.8 0.9  PROT 5.8* 5.0* 4.4* 4.9*  ALBUMIN 2.2* 1.9* 1.6* 1.8*   Recent Labs  Lab 06/12/20 1323  LIPASE 38   No results for input(s): AMMONIA in the last 168 hours. CBC: Recent Labs  Lab 06/12/20 1323 06/13/20 0838 06/14/20 0229 06/15/20 0957  WBC 14.0* 9.8 8.9 9.3  NEUTROABS 10.7*  --   --   --   HGB 17.0* 14.8 13.7 15.0  HCT 51.5* 45.8 41.3 45.0  MCV 94.5 96.0 93.9 95.3  PLT 250 173 175 218   Cardiac Enzymes: Recent Labs  Lab 06/12/20 1323  CKTOTAL 27*   BNP: Invalid input(s): POCBNP CBG: No results for input(s): GLUCAP in the last 168 hours. D-Dimer No results for input(s): DDIMER in the last 72 hours. Hgb A1c No results for input(s): HGBA1C in the last 72 hours. Lipid Profile No results for input(s): CHOL, HDL, LDLCALC, TRIG, CHOLHDL, LDLDIRECT in the last 72 hours. Thyroid function studies Recent Labs    06/13/20 0830  TSH 5.181*   Anemia work up No results for input(s): VITAMINB12, FOLATE, FERRITIN, TIBC, IRON, RETICCTPCT in the last 72 hours. Urinalysis    Component Value Date/Time   COLORURINE AMBER (A) 06/12/2020 1500   APPEARANCEUR HAZY (A) 06/12/2020 1500   APPEARANCEUR Cloudy (A) 11/28/2014 1456   LABSPEC 1.032 (H) 06/12/2020 1500   PHURINE 5.0 06/12/2020 1500   GLUCOSEU NEGATIVE 06/12/2020 1500   HGBUR LARGE (A) 06/12/2020 1500   BILIRUBINUR NEGATIVE 06/12/2020 1500   BILIRUBINUR Negative 11/28/2014 1456   KETONESUR NEGATIVE 06/12/2020 1500   PROTEINUR  >=300 (A) 06/12/2020 1500   NITRITE NEGATIVE 06/12/2020 1500   LEUKOCYTESUR NEGATIVE 06/12/2020 1500   Sepsis Labs Invalid input(s): PROCALCITONIN,  WBC,  LACTICIDVEN Microbiology Recent Results (from the past 240 hour(s))  Urine culture     Status: None   Collection Time: 06/12/20  3:00 PM   Specimen: Urine, Random  Result Value Ref Range Status   Specimen Description   Final  URINE, RANDOM Performed at [facility], [address], [place], [place] [address]    Special Requests   Final    NONE Performed at [facility], [address], [place], [place] [address]    Culture   Final    NO GROWTH Performed at [facility], [place] [address] [place], [place] [address]    Report Status 06/14/2020 FINAL  Final  Resp Panel by RT-PCR (Flu A&B, Covid) Nasopharyngeal Swab     Status: None   Collection Time: 06/12/20  3:35 PM   Specimen: Nasopharyngeal Swab; Nasopharyngeal(NP) swabs in vial transport medium  Result Value Ref Range Status   SARS Coronavirus 2 by RT PCR NEGATIVE NEGATIVE Final    Comment: (NOTE) SARS-CoV-2 target nucleic acids are NOT DETECTED.  The SARS-CoV-2 RNA is generally detectable in upper respiratory specimens during the acute phase of infection. The lowest concentration of SARS-CoV-2 viral copies this assay can detect is 138 copies/mL. A negative result does not preclude SARS-Cov-2 infection and should not be used as the sole basis for treatment or other patient management decisions. A negative result may occur with  improper specimen collection/handling, submission of specimen other than nasopharyngeal swab, presence of viral mutation(s) within the areas targeted by this assay, and inadequate number of viral copies(<138 copies/mL). A negative result must be combined with clinical observations, patient history, and epidemiological information. The expected result is Negative.  Fact Sheet for Patients:   EntrepreneurPulse.com.au  Fact Sheet for Healthcare Providers:  IncredibleEmployment.be  This test is no t yet approved or cleared by the [place] FDA and  has been authorized for detection and/or diagnosis of SARS-CoV-2 by FDA under an Emergency Use Authorization (EUA). This EUA will remain  in effect (meaning this test can be used) for the duration of the COVID-19 declaration under Section 564(b)(1) of the Act, 21 U.S.C.section 360bbb-3(b)(1), unless the authorization is terminated  or revoked sooner.       Influenza A by PCR NEGATIVE NEGATIVE Final   Influenza B by PCR NEGATIVE NEGATIVE Final    Comment: (NOTE) The Xpert Xpress SARS-CoV-2/FLU/RSV plus assay is intended as an aid in the diagnosis of influenza from Nasopharyngeal swab specimens and should not be used as a sole basis for treatment. Nasal washings and aspirates are unacceptable for Xpert Xpress SARS-CoV-2/FLU/RSV testing.  Fact Sheet for Patients: EntrepreneurPulse.com.au  Fact Sheet for Healthcare Providers: IncredibleEmployment.be  This test is not yet approved or cleared by the [place] FDA and has been authorized for detection and/or diagnosis of SARS-CoV-2 by FDA under an Emergency Use Authorization (EUA). This EUA will remain in effect (meaning this test can be used) for the duration of the COVID-19 declaration under Section 564(b)(1) of the Act, 21 U.S.C. section 360bbb-3(b)(1), unless the authorization is terminated or revoked.  Performed at [facility], [address] [place], [place] [address]   CULTURE, BLOOD (ROUTINE X 2) w Reflex to ID Panel     Status: None (Preliminary result)   Collection Time: 06/13/20 12:56 AM   Specimen: BLOOD  Result Value Ref Range Status   Specimen Description BLOOD RIGHT HAND  Final   Special Requests   Final    BOTTLES DRAWN AEROBIC AND ANAEROBIC Blood Culture results may  not be optimal due to an inadequate volume of blood received in culture bottles   Culture   Final    NO GROWTH 2 DAYS Performed at [facility], [address] [place], [place]  [address]    Report Status PENDING  Incomplete  CULTURE, BLOOD (ROUTINE X 2) w Reflex to ID Panel     Status: None (Preliminary result)   Collection Time: 06/13/20 12:56 AM   Specimen: BLOOD  Result Value Ref Range Status   Specimen Description BLOOD LEFT HAND  Final   Special Requests   Final    BOTTLES DRAWN AEROBIC AND ANAEROBIC Blood Culture results may not be optimal due to an inadequate volume of blood received in culture bottles   Culture   Final    NO GROWTH 2 DAYS Performed at [facility], [address] [place], [place] [address]    Report Status PENDING  Incomplete  Gastrointestinal Panel by PCR , Stool     Status: None   Collection Time: 06/13/20  8:38 AM   Specimen: Stool  Result Value Ref Range Status   Campylobacter species NOT DETECTED NOT DETECTED Final   Plesimonas shigelloides NOT DETECTED NOT DETECTED Final   Salmonella species NOT DETECTED NOT DETECTED Final   Yersinia enterocolitica NOT DETECTED NOT DETECTED Final   Vibrio species NOT DETECTED NOT DETECTED Final   Vibrio cholerae NOT DETECTED NOT DETECTED Final   Enteroaggregative E coli (EAEC) NOT DETECTED NOT DETECTED Final   Enteropathogenic E coli (EPEC) NOT DETECTED NOT DETECTED Final   Enterotoxigenic E coli (ETEC) NOT DETECTED NOT DETECTED Final   Shiga like toxin producing E coli (STEC) NOT DETECTED NOT DETECTED Final   Shigella/Enteroinvasive E coli (EIEC) NOT DETECTED NOT DETECTED Final   Cryptosporidium NOT DETECTED NOT DETECTED Final   Cyclospora cayetanensis NOT DETECTED NOT DETECTED Final   Entamoeba histolytica NOT DETECTED NOT DETECTED Final   Giardia lamblia NOT DETECTED NOT DETECTED Final   Adenovirus F40/41 NOT DETECTED NOT DETECTED Final   Astrovirus NOT DETECTED NOT DETECTED Final    Norovirus GI/GII NOT DETECTED NOT DETECTED Final   Rotavirus A NOT DETECTED NOT DETECTED Final   Sapovirus (I, II, IV, and V) NOT DETECTED NOT DETECTED Final    Comment: Performed at [facility], [address] [place], [place] [address]  C Difficile Quick Screen w PCR reflex     Status: None   Collection Time: 06/13/20  8:38 AM   Specimen: Stool  Result Value Ref Range Status   C Diff antigen NEGATIVE NEGATIVE Final   C Diff toxin NEGATIVE NEGATIVE Final   C Diff interpretation No C. difficile detected.  Final    Comment: Performed at [facility], [address] [place], [place] [address]  Body fluid culture     Status: None (Preliminary result)   Collection Time: 06/13/20 12:40 PM   Specimen: PATH Cytology Peritoneal fluid  Result Value Ref Range Status   Specimen Description   Final    PERITONEAL Performed at [facility], [address] [place], [place] [address]    Special Requests   Final    NONE Performed at [facility], [address] [place], [place] [address]    Gram Stain   Final    RARE WBC PRESENT, PREDOMINANTLY MONONUCLEAR NO ORGANISMS SEEN    Culture   Final    NO GROWTH 2 DAYS Performed at [facility], [place] [address], [place], [place] [address]    Report Status PENDING  Incomplete     Time coordinating discharge: Over 30 minutes  SIGNED:   Wyvonnia Dusky, MD  [facility] 06/15/2020, 1:10 PM Pager   If 7PM-7AM,  please contact night-coverage

## 2020-06-16 LAB — LIPASE, FLUID: Lipase-Fluid: 15 U/L

## 2020-06-16 LAB — TOTAL BILIRUBIN, BODY FLUID: Total bilirubin, fluid: <0.2 mg/dL

## 2020-06-17 LAB — BODY FLUID CULTURE: Culture: NO GROWTH

## 2020-06-18 LAB — CULTURE, BLOOD (ROUTINE X 2)
Culture: NO GROWTH
Culture: NO GROWTH

## 2020-06-18 LAB — CYTOLOGY - NON PAP

## 2020-06-29 ENCOUNTER — Observation Stay: Payer: Medicare Other

## 2020-06-29 ENCOUNTER — Emergency Department: Payer: Medicare Other

## 2020-06-29 ENCOUNTER — Inpatient Hospital Stay
Admission: EM | Admit: 2020-06-29 | Discharge: 2020-07-03 | DRG: 682 | Disposition: A | Discharge status: Home Health | Payer: Medicare Other | Attending: Obstetrics and Gynecology | Admitting: Obstetrics and Gynecology

## 2020-06-29 ENCOUNTER — Other Ambulatory Visit: Payer: Self-pay

## 2020-06-29 DIAGNOSIS — R188 Other ascites: Secondary | ICD-10-CM | POA: Diagnosis present

## 2020-06-29 DIAGNOSIS — F17210 Nicotine dependence, cigarettes, uncomplicated: Secondary | ICD-10-CM | POA: Diagnosis present

## 2020-06-29 DIAGNOSIS — Q619 Cystic kidney disease, unspecified: Secondary | ICD-10-CM | POA: Diagnosis present

## 2020-06-29 DIAGNOSIS — C50919 Malignant neoplasm of unspecified site of unspecified female breast: Secondary | ICD-10-CM | POA: Diagnosis present

## 2020-06-29 DIAGNOSIS — I2699 Other pulmonary embolism without acute cor pulmonale: Secondary | ICD-10-CM | POA: Diagnosis present

## 2020-06-29 DIAGNOSIS — E871 Hypo-osmolality and hyponatremia: Secondary | ICD-10-CM | POA: Diagnosis present

## 2020-06-29 DIAGNOSIS — K7460 Unspecified cirrhosis of liver: Secondary | ICD-10-CM | POA: Diagnosis present

## 2020-06-29 DIAGNOSIS — R600 Localized edema: Secondary | ICD-10-CM

## 2020-06-29 DIAGNOSIS — I480 Paroxysmal atrial fibrillation: Secondary | ICD-10-CM | POA: Diagnosis present

## 2020-06-29 DIAGNOSIS — Z66 Do not resuscitate: Secondary | ICD-10-CM | POA: Diagnosis present

## 2020-06-29 DIAGNOSIS — Z9114 Patient's other noncompliance with medication regimen: Secondary | ICD-10-CM

## 2020-06-29 DIAGNOSIS — K219 Gastro-esophageal reflux disease without esophagitis: Secondary | ICD-10-CM | POA: Diagnosis present

## 2020-06-29 DIAGNOSIS — Z9119 Patient's noncompliance with other medical treatment and regimen: Secondary | ICD-10-CM

## 2020-06-29 DIAGNOSIS — Z7901 Long term (current) use of anticoagulants: Secondary | ICD-10-CM

## 2020-06-29 DIAGNOSIS — K767 Hepatorenal syndrome: Secondary | ICD-10-CM | POA: Diagnosis not present

## 2020-06-29 DIAGNOSIS — R5381 Other malaise: Secondary | ICD-10-CM | POA: Diagnosis not present

## 2020-06-29 DIAGNOSIS — K7581 Nonalcoholic steatohepatitis (NASH): Secondary | ICD-10-CM | POA: Diagnosis present

## 2020-06-29 DIAGNOSIS — Z86711 Personal history of pulmonary embolism: Secondary | ICD-10-CM

## 2020-06-29 DIAGNOSIS — I10 Essential (primary) hypertension: Secondary | ICD-10-CM | POA: Diagnosis present

## 2020-06-29 DIAGNOSIS — U071 COVID-19: Secondary | ICD-10-CM

## 2020-06-29 DIAGNOSIS — N170 Acute kidney failure with tubular necrosis: Secondary | ICD-10-CM | POA: Diagnosis not present

## 2020-06-29 DIAGNOSIS — Z79899 Other long term (current) drug therapy: Secondary | ICD-10-CM

## 2020-06-29 DIAGNOSIS — E861 Hypovolemia: Secondary | ICD-10-CM | POA: Diagnosis present

## 2020-06-29 DIAGNOSIS — M79604 Pain in right leg: Secondary | ICD-10-CM

## 2020-06-29 DIAGNOSIS — Z85828 Personal history of other malignant neoplasm of skin: Secondary | ICD-10-CM

## 2020-06-29 DIAGNOSIS — R531 Weakness: Secondary | ICD-10-CM

## 2020-06-29 DIAGNOSIS — M79605 Pain in left leg: Secondary | ICD-10-CM

## 2020-06-29 DIAGNOSIS — Z9221 Personal history of antineoplastic chemotherapy: Secondary | ICD-10-CM

## 2020-06-29 DIAGNOSIS — I959 Hypotension, unspecified: Secondary | ICD-10-CM | POA: Diagnosis present

## 2020-06-29 DIAGNOSIS — Z9049 Acquired absence of other specified parts of digestive tract: Secondary | ICD-10-CM

## 2020-06-29 DIAGNOSIS — D638 Anemia in other chronic diseases classified elsewhere: Secondary | ICD-10-CM | POA: Diagnosis present

## 2020-06-29 DIAGNOSIS — N179 Acute kidney failure, unspecified: Secondary | ICD-10-CM

## 2020-06-29 DIAGNOSIS — Z923 Personal history of irradiation: Secondary | ICD-10-CM

## 2020-06-29 DIAGNOSIS — R109 Unspecified abdominal pain: Secondary | ICD-10-CM

## 2020-06-29 DIAGNOSIS — Z853 Personal history of malignant neoplasm of breast: Secondary | ICD-10-CM

## 2020-06-29 LAB — CBC WITH DIFFERENTIAL/PLATELET
Abs Immature Granulocytes: 0.09 10*3/uL — ABNORMAL HIGH (ref 0.00–0.07)
Basophils Absolute: 0 10*3/uL (ref 0.0–0.1)
Basophils Relative: 0 %
Eosinophils Absolute: 0 10*3/uL (ref 0.0–0.5)
Eosinophils Relative: 0 %
HCT: 48.1 % — ABNORMAL HIGH (ref 36.0–46.0)
Hemoglobin: 16 g/dL — ABNORMAL HIGH (ref 12.0–15.0)
Immature Granulocytes: 1 %
Lymphocytes Relative: 17 %
Lymphs Abs: 1.6 10*3/uL (ref 0.7–4.0)
MCH: 31.2 pg (ref 26.0–34.0)
MCHC: 33.3 g/dL (ref 30.0–36.0)
MCV: 93.8 fL (ref 80.0–100.0)
Monocytes Absolute: 0.7 10*3/uL (ref 0.1–1.0)
Monocytes Relative: 7 %
Neutro Abs: 7.2 10*3/uL (ref 1.7–7.7)
Neutrophils Relative %: 75 %
Platelets: 136 10*3/uL — ABNORMAL LOW (ref 150–400)
RBC: 5.13 MIL/uL — ABNORMAL HIGH (ref 3.87–5.11)
RDW: 16.5 % — ABNORMAL HIGH (ref 11.5–15.5)
WBC: 9.6 10*3/uL (ref 4.0–10.5)
nRBC: 0 % (ref 0.0–0.2)

## 2020-06-29 LAB — COMPREHENSIVE METABOLIC PANEL
ALT: 44 U/L (ref 0–44)
AST: 72 U/L — ABNORMAL HIGH (ref 15–41)
Albumin: 2 g/dL — ABNORMAL LOW (ref 3.5–5.0)
Alkaline Phosphatase: 157 U/L — ABNORMAL HIGH (ref 38–126)
Anion gap: 13 (ref 5–15)
BUN: 48 mg/dL — ABNORMAL HIGH (ref 8–23)
CO2: 20 mmol/L — ABNORMAL LOW (ref 22–32)
Calcium: 7.7 mg/dL — ABNORMAL LOW (ref 8.9–10.3)
Chloride: 98 mmol/L (ref 98–111)
Creatinine, Ser: 1.52 mg/dL — ABNORMAL HIGH (ref 0.44–1.00)
GFR, Estimated: 34 mL/min — ABNORMAL LOW (ref >60)
Glucose, Bld: 113 mg/dL — ABNORMAL HIGH (ref 70–99)
Potassium: 4.4 mmol/L (ref 3.5–5.1)
Sodium: 131 mmol/L — ABNORMAL LOW (ref 135–145)
Total Bilirubin: 1.4 mg/dL — ABNORMAL HIGH (ref 0.3–1.2)
Total Protein: 5.8 g/dL — ABNORMAL LOW (ref 6.5–8.1)

## 2020-06-29 LAB — PROTIME-INR
INR: 1.1 (ref 0.8–1.2)
Prothrombin Time: 13.7 seconds (ref 11.4–15.2)

## 2020-06-29 LAB — BRAIN NATRIURETIC PEPTIDE: B Natriuretic Peptide: 190.5 pg/mL — ABNORMAL HIGH (ref 0.0–100.0)

## 2020-06-29 LAB — MAGNESIUM: Magnesium: 2.1 mg/dL (ref 1.7–2.4)

## 2020-06-29 LAB — URINALYSIS, COMPLETE (UACMP) WITH MICROSCOPIC
Bilirubin Urine: NEGATIVE
Glucose, UA: NEGATIVE mg/dL
Ketones, ur: NEGATIVE mg/dL
Leukocytes,Ua: NEGATIVE
Nitrite: NEGATIVE
Protein, ur: 100 mg/dL — AB
RBC / HPF: >50 RBC/hpf — ABNORMAL HIGH (ref 0–5)
Specific Gravity, Urine: 1.017 (ref 1.005–1.030)
pH: 5 (ref 5.0–8.0)

## 2020-06-29 LAB — TROPONIN I (HIGH SENSITIVITY)
Troponin I (High Sensitivity): 20 ng/L — ABNORMAL HIGH (ref <18)
Troponin I (High Sensitivity): 20 ng/L — ABNORMAL HIGH (ref <18)

## 2020-06-29 LAB — CK: Total CK: 107 U/L (ref 38–234)

## 2020-06-29 LAB — TSH: TSH: 2.445 u[IU]/mL (ref 0.350–4.500)

## 2020-06-29 MED ORDER — ACETAMINOPHEN 650 MG RE SUPP: 325.0000 mg | Freq: Four times a day (QID) | RECTAL | Status: AC | PRN

## 2020-06-29 MED ORDER — SODIUM CHLORIDE 0.9 % IV SOLN: INTRAVENOUS | Status: DC

## 2020-06-29 MED ORDER — ONDANSETRON HCL 4 MG PO TABS: 4.0000 mg | ORAL_TABLET | Freq: Four times a day (QID) | ORAL | Status: DC | PRN

## 2020-06-29 MED ORDER — SPIRONOLACTONE 25 MG PO TABS
25.0000 mg | ORAL_TABLET | Freq: Every day | ORAL | Status: DC
  Administered 2020-06-30: 25 mg via ORAL
  Filled 2020-06-29: qty 1

## 2020-06-29 MED ORDER — METOPROLOL TARTRATE 5 MG/5ML IV SOLN: 5.0000 mg | INTRAVENOUS | Status: DC | PRN

## 2020-06-29 MED ORDER — ONDANSETRON HCL 4 MG/2ML IJ SOLN
4.0000 mg | Freq: Four times a day (QID) | INTRAMUSCULAR | Status: DC | PRN
  Administered 2020-06-29: 4 mg via INTRAVENOUS
  Filled 2020-06-29: qty 2

## 2020-06-29 MED ORDER — ACETAMINOPHEN 325 MG PO TABS
325.0000 mg | ORAL_TABLET | Freq: Four times a day (QID) | ORAL | Status: AC | PRN
  Administered 2020-06-29 – 2020-07-01 (×2): 325 mg via ORAL
  Filled 2020-06-29 (×2): qty 1

## 2020-06-29 MED ORDER — PANTOPRAZOLE SODIUM 40 MG PO TBEC
40.0000 mg | DELAYED_RELEASE_TABLET | Freq: Every day | ORAL | Status: DC
  Administered 2020-06-30 – 2020-07-03 (×4): 40 mg via ORAL
  Filled 2020-06-29 (×5): qty 1

## 2020-06-29 MED ORDER — ALBUMIN HUMAN 25 % IV SOLN
25.0000 g | Freq: Four times a day (QID) | INTRAVENOUS | Status: DC
  Administered 2020-06-29 – 2020-07-02 (×10): 25 g via INTRAVENOUS
  Filled 2020-06-29 (×14): qty 100

## 2020-06-29 MED ORDER — METOPROLOL TARTRATE 25 MG PO TABS
12.5000 mg | ORAL_TABLET | Freq: Two times a day (BID) | ORAL | Status: DC
  Administered 2020-06-29 – 2020-07-03 (×5): 12.5 mg via ORAL
  Filled 2020-06-29 (×8): qty 1

## 2020-06-29 MED ORDER — APIXABAN 5 MG PO TABS
5.0000 mg | ORAL_TABLET | Freq: Two times a day (BID) | ORAL | Status: DC
  Administered 2020-06-29 – 2020-07-03 (×8): 5 mg via ORAL
  Filled 2020-06-29 (×9): qty 1

## 2020-06-29 MED ORDER — FENTANYL CITRATE (PF) 100 MCG/2ML IJ SOLN
12.5000 ug | Freq: Once | INTRAMUSCULAR | Status: AC
  Administered 2020-06-30: 12.5 ug via INTRAVENOUS
  Filled 2020-06-29: qty 2

## 2020-06-29 NOTE — ED Provider Notes (Addendum)
[facility] Emergency Department Provider Note  ____________________________________________   None    (approximate)  I have reviewed the triage vital signs and the nursing notes.   HISTORY  Chief Complaint Edema   HPI [name] is a 85 y.o. female with medical history of HTN, breast cancer s/p right lumpectomy currently in remission, atrial fibrillation, b/l PE dx in early Jan on eliquis, GERD as well as chronic intermittent headaches and nausea and lower extremity edema who presents via EMS from home for assessment of worsening bilateral lower extremity pain and weakness.  Patient also think she has been urinating less than usual.  She has not had any new chest pain, change in her cough that she had prior to her last admission in mid January, change in her chronic headaches, extremity pain, rash, recent falls or injuries, other urinary symptoms, diarrhea, or any pain or discomfort in her upper extremities.  She states she is taking all her medications as directed.  No clear alleviating anything factors.  He states she currently feels too weak and in pain to ambulate is able to get up to urinate last night and had to pee in a trash can.         Past Medical History:  Diagnosis Date  . Arthritis    lower back, knees  . Breast cancer ([place]) 2011   RT LUMPECTOMY  . Chemotherapy follow-up examination 2011   RT BREAST CANCER  . Cirrhosis ([place])   . Personal history of chemotherapy   . Personal history of radiation therapy   . Radiation 2011   RT BREAST CANCER  . Skin cancer   . Wears dentures    full upper, partial lower    Patient Active Problem List   Diagnosis Date Noted  . Sepsis ([place]) 06/12/2020  . Acute pulmonary embolism ([place]) 06/02/2020  . Breast cancer ([place]) 11/02/2014  . Breast CA ([place]) 01/19/2013    Past Surgical History:  Procedure Laterality Date  . BREAST BIOPSY Right 2014   RT CORE W/CLIP - NEG  . BREAST EXCISIONAL BIOPSY  Right 2011   breast ca  . CATARACT EXTRACTION W/PHACO Right 04/15/2016   Procedure: CATARACT EXTRACTION PHACO AND INTRAOCULAR LENS PLACEMENT (IOC);  Surgeon: Leandrew Koyanagi, MD;  Location: [place];  Service: Ophthalmology;  Laterality: Right;  . CATARACT EXTRACTION W/PHACO Left 07/01/2016   Procedure: CATARACT EXTRACTION PHACO AND INTRAOCULAR LENS PLACEMENT (IOC);  Surgeon: Leandrew Koyanagi, MD;  Location: [place];  Service: Ophthalmology;  Laterality: Left;  Left eye  . CHOLECYSTECTOMY      Prior to Admission medications   Medication Sig Start Date End Date Taking? Authorizing Provider  albuterol (VENTOLIN HFA) 108 (90 Base) MCG/ACT inhaler Inhale 2 puffs into the lungs every 6 (six) hours as needed for wheezing or shortness of breath. 06/04/20   Shelly Coss, MD  apixaban (ELIQUIS) 5 MG TABS tablet Take 1 tablet (5 mg total) by mouth 2 (two) times daily. 06/10/20   Shelly Coss, MD  apixaban (ELIQUIS) 5 MG TABS tablet Take 10 mg by mouth 2 (two) times daily.    [provider]  calcium carbonate (OS-CAL) 1250 (500 Ca) MG chewable tablet Chew 1 tablet by mouth daily.    [provider]  Cholecalciferol 25 MCG (1000 UT) tablet Take 1,000 Units by mouth daily.    [provider]  furosemide (LASIX) 40 MG tablet Take 1 tablet (40 mg total) by mouth daily. 06/15/20 07/15/20  Wyvonnia Dusky, MD  meloxicam (MOBIC) 15 MG tablet Take 15 mg by mouth daily as needed for pain.    [provider]  metoprolol tartrate (LOPRESSOR) 25 MG tablet Take 0.5 tablets (12.5 mg total) by mouth 2 (two) times daily. 06/04/20   Shelly Coss, MD  Multiple Vitamins-Minerals (CENTRUM SILVER 50+WOMEN) TABS Take 1 tablet by mouth daily.    [provider]  pantoprazole (PROTONIX) 40 MG tablet Take 1 tablet (40 mg total) by mouth daily. 06/04/20   Shelly Coss, MD  potassium chloride (KLOR-CON) 10 MEQ tablet Take 1 tablet (10 mEq total) by  mouth daily. 06/15/20 07/15/20  Wyvonnia Dusky, MD    Allergies Gabapentin and Ivp dye [iodinated diagnostic agents]  Family History  Problem Relation Age of Onset  . Cancer Father        prostate  . Cancer Brother        prostate  . Breast cancer Neg Hx     Social History Social History   Tobacco Use  . Smoking status: Current Every Day Smoker    Packs/day: 0.50    Types: Cigarettes    Last attempt to quit: 04/28/2014    Years since quitting: 6.1  . Smokeless tobacco: Former Network engineer Use Topics  . Alcohol use: No    Alcohol/week: 0.0 standard drinks    Comment: occasional glass of wine  . Drug use: No    Review of Systems  Review of Systems  Constitutional: Negative for chills and fever.  HENT: Negative for sore throat.   Eyes: Negative for pain.  Respiratory: Negative for cough and stridor.   Cardiovascular: Negative for chest pain.  Gastrointestinal: Negative for vomiting.  Skin: Negative for rash.  Neurological: Positive for headaches. Negative for seizures and loss of consciousness.  Psychiatric/Behavioral: Negative for suicidal ideas.  All other systems reviewed and are negative.     ____________________________________________   PHYSICAL EXAM:  VITAL SIGNS: ED Triage Vitals  Enc Vitals Group     BP      Pulse      Resp      Temp      Temp src      SpO2      Weight      Height      Head Circumference      Peak Flow      Pain Score      Pain Loc      Pain Edu?      Excl. in [place]?    Vitals:   06/29/20 1400 06/29/20 1415  BP: 106/60 (!) 109/58  Pulse: (!) 106 95  Resp: 10 14  Temp:    SpO2: 98% 99%   Physical Exam Vitals and nursing note reviewed.  Constitutional:      General: She is not in acute distress.    Appearance: She is well-developed and well-nourished. She is ill-appearing.  HENT:     Head: Normocephalic and atraumatic.     Right Ear: External ear normal.     Left Ear: External ear normal.     Nose: Nose  normal.     Mouth/Throat:     Mouth: Mucous membranes are moist.  Eyes:     Conjunctiva/sclera: Conjunctivae normal.  Cardiovascular:     Rate and Rhythm: Regular rhythm. Tachycardia present.     Heart sounds: No murmur heard.   Pulmonary:     Effort: Pulmonary effort is normal. No respiratory distress.  Breath sounds: Normal breath sounds.  Abdominal:     Palpations: Abdomen is soft.     Tenderness: There is no abdominal tenderness.  Musculoskeletal:     Cervical back: Neck supple.     Right lower leg: Edema present.     Left lower leg: Edema present.  Skin:    General: Skin is warm and dry.     Capillary Refill: Capillary refill takes less than 2 seconds.  Neurological:     Mental Status: She is alert and oriented to person, place, and time.  Psychiatric:        Mood and Affect: Mood and affect and mood normal.     2+ bilateral radial DP pulses.  Patient has purple discoloration throughout her bilateral extremities more on the left than the right without induration, warmth, streaking or other skin changes.  No effusion or deformity at the bilateral knees ankles or hips.  Patient has full passive range of motion but significant pain on passive ranging. 30 and patient does withdraw both extremities to noxious stimuli. Suspect weakness is more pain related than true decreased power. ____________________________________________   LABS (all labs ordered are listed, but only abnormal results are displayed)  Labs Reviewed  CBC WITH DIFFERENTIAL/PLATELET - Abnormal; Notable for the following components:      Result Value   RBC 5.13 (*)    Hemoglobin 16.0 (*)    HCT 48.1 (*)    RDW 16.5 (*)    Platelets 136 (*)    Abs Immature Granulocytes 0.09 (*)    All other components within normal limits  COMPREHENSIVE METABOLIC PANEL - Abnormal; Notable for the following components:   Sodium 131 (*)    CO2 20 (*)    Glucose, Bld 113 (*)    BUN 48 (*)    Creatinine, Ser 1.52 (*)     Calcium 7.7 (*)    Total Protein 5.8 (*)    Albumin 2.0 (*)    AST 72 (*)    Alkaline Phosphatase 157 (*)    Total Bilirubin 1.4 (*)    GFR, Estimated 34 (*)    All other components within normal limits  BRAIN NATRIURETIC PEPTIDE - Abnormal; Notable for the following components:   B Natriuretic Peptide 190.5 (*)    All other components within normal limits  TROPONIN I (HIGH SENSITIVITY) - Abnormal; Notable for the following components:   Troponin I (High Sensitivity) 20 (*)    All other components within normal limits  SARS CORONAVIRUS 2 (TAT 6-24 HRS)  MAGNESIUM  CK  URINALYSIS, COMPLETE (UACMP) WITH MICROSCOPIC  TROPONIN I (HIGH SENSITIVITY)   ____________________________________________  EKG  Sinus tachycardia with a ventricular rate of 103, normal axis, unremarkable intervals, no clear evidence of acute ischemia or other significant underlying arrhythmia.  ____________________________________________  RADIOLOGY  ED MD interpretation: No focal consolidation, overt pulmonary edema, pneumothorax, or other clear acute process.  Patient has some opacity in the right lung which is improved when compared to prior chest x-ray obtained on 1/12.  Patient also has a chronic appearing trace right pleural effusion is also present on prior chest imaging.  Official radiology report(s): DG Chest 2 View  Result Date: 06/29/2020 CLINICAL DATA:  Worsening lower extremity edema. EXAM: CHEST - 2 VIEW COMPARISON:  Chest radiograph dated 06/12/2020. FINDINGS: The heart remains enlarged. An airspace opacity in the right lower lung has decreased since prior exam and minimal right basilar atelectasis/airspace disease persists. There is a trace right pleural effusion.  The left lung is clear. There is no pneumothorax. There is diffuse osseous demineralization. Degenerative changes are seen in the spine. IMPRESSION: 1. Improved airspace opacity in the right lower lung. 2. Trace right pleural effusion.  Electronically Signed   By: Zerita Boers M.D.   On: 06/29/2020 13:46   DG Pelvis Portable  Result Date: 06/29/2020 CLINICAL DATA:  Weakness. Unable to sleep the bed for 1 week. Lower extremity edema and pain. EXAM: PORTABLE PELVIS 1-2 VIEWS COMPARISON:  None. FINDINGS: The cortical margins of the bony pelvis are intact. No fracture. Pubic symphysis and sacroiliac joints are congruent. Degenerative change of both hips with acetabular spurring, right greater than left. There is mild bilateral hip joint space narrowing. No evidence of a vascular necrosis, focal bone lesion or bony destruction. Surgical hardware in the lower lumbar spine is partially included. IMPRESSION: Mild osteoarthritis of both hips, right greater than left. Electronically Signed   By: Keith Rake M.D.   On: 06/29/2020 15:17    ____________________________________________   PROCEDURES  Procedure(s) performed (including Critical Care):  Procedures   ____________________________________________   INITIAL IMPRESSION / ASSESSMENT AND PLAN / ED COURSE      Patient presents with left to history exam for assessment of worsening bilateral lower extremity pain swelling in the setting of chronic lower extremity edema and associated new weakness and inability to ambulate.  It seems patient other symptoms are all fairly chronic and she does not seem to have any other acute pain or sick symptoms today other than the lower extremities.  No recent falls or injuries.  She is slightly tachycardic with otherwise stable vital signs on arrival.  She does have significant bilateral lower extremity edema as well as purplish discoloration throughout her lower legs consistent with likely chronic venous congestion.  Differential includes but is not limited to myositis, progression of vascular congestion, peripheral neuropathy, worsening of lower extremity edema secondary to new heart failure or kidney failure.  Pain and edema is fairly  symmetric and patient is anticoagulated and have a low suspicion for DVT at this time.  No history or exam findings suggest acute traumatic injury.  No evidence of arterial occlusion on exam.  Discoloration is more consistent with chronic venous congestion and less of a cellulitis.  Low suspicion for acute infectious process  BNP is slightly above baseline at 190 today with given absence of significant edema on chest x-ray and any respiratory distress hypoxia or tachypnea have a low suspicion for acute heart failure exacerbation at this time.  CBC is unremarkable has no evidence of acute anemia or leukocytosis. CMP remarkable for bicarb of 20, hypoalbumin which is at baseline, mild elevation of AST which is close to baseline, alk phos of 157 compared to 135 2 weeks ago and T bili of 1.4. No right upper quadrant tenderness or findings on exam to suggest acute cholestatic process at this time. Magnesium is WNL. Troponin is 20 and will have a low suspicion for ACS this may reflect some very mild demand ischemia and I will plan to obtain a second troponin. CK is nonelevated at 107 and have a very low suspicion for myositis at this time. We will send UA to assess for any evidence of cystitis that could be contributing to patient's lower extremity weakness. Patient is also pending kidney function studies. Care patient signed over to oncoming provider at approximately 1500. Plan is to follow-up repeat troponin, UA, pelvis x-ray, and kidney function and if all normal and unremarkable  likely order PT and OT consults for SNF placement versus admit for any abnormalities.  ____________________________________________   FINAL CLINICAL IMPRESSION(S) / ED DIAGNOSES  Final diagnoses:  Pain in both lower extremities  Bilateral leg edema    Medications - No data to display   ED Discharge Orders    None       Note:  This document was prepared using Dragon voice recognition software and may include unintentional  dictation errors.   Lucrezia Starch, MD 06/29/20 1511    Lucrezia Starch, MD 06/29/20 1512    Lucrezia Starch, MD 06/29/20 [phone]

## 2020-06-29 NOTE — H&P (Addendum)
History and Physical   [name]:[id number] DOB: 10/13/1935 DOA: 06/29/2020  PCP: Sallee Lange, NP  Outpatient Specialists: Dr. Nehemiah Massed, cardiiology Patient coming from: Home via EMS  I have personally briefly reviewed patient's old medical records in [place].  Chief Concern: Weakness  HPI: [name] is a 85 y.o. female with medical history significant for paroxysmal atrial fibrillation, history of nonalcoholic hepatic steatosis that has progressed liver cirrhosis with cirrhosis, debility, small segmental pulmonary embolism in the right and left lower lobes currently on Eliquis twice daily, presented to the emergency department for chief concerns of weakness.  She endorses acute weakness and unable to walk since Wednesday (06/26/20) or Thursday (06/27/20), patient is not sure. She states she does not have shortness of breath with laying flat. She endorses shortness of breath with ambulation.  She states that she has had decreased urination in the last week.  She endorses compliance of medications.  However she did state that she has stopped taking her Lasix because she "I felt bad"  Social history: she lives with her divorced ex spouse. They divorced in 1993. He is the father of her three children. She endorses daily tobacco use and quit in November 2021. She endorses infrequent etioh use. She denies recreational drug use.  Vaccination: Patient is unvaccinated for COVID-19  ROS: Constitutional: no weight change, no fever ENT/Mouth: no sore throat, no rhinorrhea Eyes: no eye pain, no vision changes Cardiovascular: no chest pain, + dyspnea,  + edema, no palpitations Respiratory: no cough, no sputum, no wheezing Gastrointestinal: no nausea, no vomiting, no diarrhea, no constipation Genitourinary: no urinary incontinence, no dysuria, no hematuria Musculoskeletal: no arthralgias, no myalgias Skin: no skin lesions, no pruritus, Neuro: + weakness, no loss of  consciousness, no syncope Psych: no anxiety, no depression, + decrease appetite Heme/Lymph: no bruising, no bleeding  ED Course: Discussed with ED provider, patient requiring hospitalization due to weakness likely requiring PT and SNF placement.  Assessment/Plan  Active Problems:   Breast cancer (HCC)   Acute pulmonary embolism (HCC)   AKI (acute kidney injury) ([place])   Debility   Acute kidney injury in setting of liver cirrhosis and poor compliance -Worsening liver enzymes and hyponatremia in setting of noncompliance with medical med recommendations -Patient has been advised to follow-up outpatient with gastroenterologist for the last 12 years but she does not believe that she has liver cirrhosis -Query hepatorenal -Checking PT and INR to calculate meld score -GI specialist, Dr. Andrey Farmer has been consulted by me via secure chat and recommends holding patient lasix and give albumin 25% 1 g/kg q6hrs (max of 100 grams in 24 hours), GI specialist will see the patient -Lasix has been discontinued -Resumed home spironolactone 25 mg daily -CMP in the a.m.  Hypertension-controlled -Resumed home metoprolol 12.5 mg p.o. twice daily -Metoprolol tartrate injection 5 mg IV every 4 hours as needed for SBP, 3 doses ordered  Acute onset of weakness-etiology likely worsening debility -TOC/PT/OT for SNF -CT the head without contrast was ordered -Bilateral lower extremity ultrasound to assess for DVT -Checking B12 and TSH  Bilateral subsegmental pulmonary embolism-resumed home apixaban 5 mg p.o. twice daily  Paroxysmal atrial fibrillation-apixaban 5 mg p.o. twice daily  Bilateral lower extremity venous changes-with redness however no evidence of cellulitis, this appears to be chronic in nature  Chart reviewed.   DVT prophylaxis: Apixaban 5 mg twice daily p.o. Code Status: full code  Diet: Heart healthy Family Communication: [name], son, at bedside  Disposition Plan: Pending  clinical course Consults called: Gastroenterology Admission status: Observation  Past Medical History:  Diagnosis Date  . Arthritis    lower back, knees  . Breast cancer ([place]) 2011   RT LUMPECTOMY  . Chemotherapy follow-up examination 2011   RT BREAST CANCER  . Cirrhosis ([place])   . Personal history of chemotherapy   . Personal history of radiation therapy   . Radiation 2011   RT BREAST CANCER  . Skin cancer   . Wears dentures    full upper, partial lower   Past Surgical History:  Procedure Laterality Date  . BREAST BIOPSY Right 2014   RT CORE W/CLIP - NEG  . BREAST EXCISIONAL BIOPSY Right 2011   breast ca  . CATARACT EXTRACTION W/PHACO Right 04/15/2016   Procedure: CATARACT EXTRACTION PHACO AND INTRAOCULAR LENS PLACEMENT (IOC);  Surgeon: Leandrew Koyanagi, MD;  Location: [facility];  Service: Ophthalmology;  Laterality: Right;  . CATARACT EXTRACTION W/PHACO Left 07/01/2016   Procedure: CATARACT EXTRACTION PHACO AND INTRAOCULAR LENS PLACEMENT (IOC);  Surgeon: Leandrew Koyanagi, MD;  Location: [facility];  Service: Ophthalmology;  Laterality: Left;  Left eye  . CHOLECYSTECTOMY     Social History:  reports that she has been smoking cigarettes. She has been smoking about 0.50 packs per day. She has quit using smokeless tobacco. She reports that she does not drink alcohol and does not use drugs.  Allergies  Allergen Reactions  . Gabapentin Itching  . Ivp Dye [Iodinated Diagnostic Agents] Itching   Family History  Problem Relation Age of Onset  . Cancer Father        prostate  . Cancer Brother        prostate  . Breast cancer Neg Hx    Family history: Family history reviewed and not pertinent  Prior to Admission medications   Medication Sig Start Date End Date Taking? Authorizing Provider  albuterol (VENTOLIN HFA) 108 (90 Base) MCG/ACT inhaler Inhale 2 puffs into the lungs every 6 (six) hours as needed for wheezing or shortness of breath. 06/04/20    Shelly Coss, MD  apixaban (ELIQUIS) 5 MG TABS tablet Take 1 tablet (5 mg total) by mouth 2 (two) times daily. 06/10/20   Shelly Coss, MD  apixaban (ELIQUIS) 5 MG TABS tablet Take 10 mg by mouth 2 (two) times daily.    [provider]  calcium carbonate (OS-CAL) 1250 (500 Ca) MG chewable tablet Chew 1 tablet by mouth daily.    [provider]  Cholecalciferol 25 MCG (1000 UT) tablet Take 1,000 Units by mouth daily.    [provider]  furosemide (LASIX) 40 MG tablet Take 1 tablet (40 mg total) by mouth daily. 06/15/20 07/15/20  Wyvonnia Dusky, MD  meloxicam (MOBIC) 15 MG tablet Take 15 mg by mouth daily as needed for pain.    [provider]  metoprolol tartrate (LOPRESSOR) 25 MG tablet Take 0.5 tablets (12.5 mg total) by mouth 2 (two) times daily. 06/04/20   Shelly Coss, MD  Multiple Vitamins-Minerals (CENTRUM SILVER 50+WOMEN) TABS Take 1 tablet by mouth daily.    [provider]  pantoprazole (PROTONIX) 40 MG tablet Take 1 tablet (40 mg total) by mouth daily. 06/04/20   Shelly Coss, MD  potassium chloride (KLOR-CON) 10 MEQ tablet Take 1 tablet (10 mEq total) by mouth daily. 06/15/20 07/15/20  Wyvonnia Dusky, MD   Physical Exam: Vitals:   06/29/20 1330 06/29/20 1345 06/29/20 1400 06/29/20 1415  BP: 95/82 112/88  106/60 (!) 109/58  Pulse: (!) 103 (!) 107 (!) 106 95  Resp: 15 14 10 14   Temp:      TempSrc:      SpO2: 95% 98% 98% 99%   Constitutional: appears age-appropriate, NAD, calm, comfortable Eyes: PERRL, lids and conjunctivae normal ENMT: Mucous membranes are moist. Posterior pharynx clear of any exudate or lesions. Age-appropriate dentition. Hearing appropriate Neck: normal, supple, no masses, no thyromegaly Respiratory: clear to auscultation bilaterally, no wheezing, no crackles. Normal respiratory effort. No accessory muscle use.  Cardiovascular: Regular rate and rhythm, no murmurs / rubs / gallops.  Bilateral lower  extremity pitting edema 2+.. 2+ pedal pulses. No carotid bruits.  Abdomen: Obese, no tenderness, no masses palpated, no hepatosplenomegaly. Bowel sounds positive.  Musculoskeletal: no clubbing / cyanosis. No joint deformity upper and lower extremities. Good ROM, no contractures, no atrophy. Normal muscle tone.  Skin: no rashes, lesions, ulcers. No induration Neurologic: Sensation intact. Strength 5/5 in bilateral upper extremities.  Strength is 4 out of 5 in the right lower extremity.  Strength is 3 out of 5 in the left lower extremity. Psychiatric: Normal judgment and insight. Alert and oriented x 3. Normal mood.   EKG: independently reviewed, showing sinus tachycardia, rate of 103, QTc 414  Chest x-ray on Admission: I personally reviewed and I agree with radiologist reading as below.  DG Chest 2 View  Result Date: 06/29/2020 CLINICAL DATA:  Worsening lower extremity edema. EXAM: CHEST - 2 VIEW COMPARISON:  Chest radiograph dated 06/12/2020. FINDINGS: The heart remains enlarged. An airspace opacity in the right lower lung has decreased since prior exam and minimal right basilar atelectasis/airspace disease persists. There is a trace right pleural effusion. The left lung is clear. There is no pneumothorax. There is diffuse osseous demineralization. Degenerative changes are seen in the spine. IMPRESSION: 1. Improved airspace opacity in the right lower lung. 2. Trace right pleural effusion. Electronically Signed   By: Zerita Boers M.D.   On: 06/29/2020 13:46   CT HEAD WO CONTRAST  Result Date: 06/29/2020 CLINICAL DATA:  BILATERAL worsening lower extremity pain and weakness, history hypertension, breast cancer, atrial fibrillation, question stroke, history cirrhosis EXAM: CT HEAD WITHOUT CONTRAST TECHNIQUE: Contiguous axial images were obtained from the base of the skull through the vertex without intravenous contrast. Sagittal and coronal MPR images reconstructed from axial data set. COMPARISON:   06/01/2020 FINDINGS: Brain: Generalized atrophy. Normal ventricular morphology. No midline shift or mass effect. Small vessel chronic ischemic changes of deep cerebral white matter. No intracranial hemorrhage, mass lesion, evidence of acute infarction, or extra-axial fluid collection. Vascular: Atherosclerotic calcification of internal carotid arteries at skull base. No hyperdense vessels Skull: Intact Sinuses/Orbits: Clear Other: N/A IMPRESSION: Atrophy with small vessel chronic ischemic changes of deep cerebral white matter. No acute intracranial abnormalities. Electronically Signed   By: Lavonia Dana M.D.   On: 06/29/2020 16:42   DG Pelvis Portable  Result Date: 06/29/2020 CLINICAL DATA:  Weakness. Unable to sleep the bed for 1 week. Lower extremity edema and pain. EXAM: PORTABLE PELVIS 1-2 VIEWS COMPARISON:  None. FINDINGS: The cortical margins of the bony pelvis are intact. No fracture. Pubic symphysis and sacroiliac joints are congruent. Degenerative change of both hips with acetabular spurring, right greater than left. There is mild bilateral hip joint space narrowing. No evidence of a vascular necrosis, focal bone lesion or bony destruction. Surgical hardware in the lower lumbar spine is partially included. IMPRESSION: Mild osteoarthritis of both hips, right greater than left.  Electronically Signed   By: Keith Rake M.D.   On: 06/29/2020 15:17    Labs on Admission: I have personally reviewed following labs  CBC: Recent Labs  Lab 06/29/20 1224  WBC 9.6  NEUTROABS 7.2  HGB 16.0*  HCT 48.1*  MCV 93.8  PLT 747*   Basic Metabolic Panel: Recent Labs  Lab 06/29/20 1224  NA 131*  K 4.4  CL 98  CO2 20*  GLUCOSE 113*  BUN 48*  CREATININE 1.52*  CALCIUM 7.7*  MG 2.1   GFR: CrCl cannot be calculated (Unknown ideal weight.). Liver Function Tests: Recent Labs  Lab 06/29/20 1224  AST 72*  ALT 44  ALKPHOS 157*  BILITOT 1.4*  PROT 5.8*  ALBUMIN 2.0*   Cardiac  Enzymes: Recent Labs  Lab 06/29/20 1224  CKTOTAL 107   Urine analysis:    Component Value Date/Time   COLORURINE AMBER (A) 06/29/2020 1456   APPEARANCEUR CLOUDY (A) 06/29/2020 1456   APPEARANCEUR Cloudy (A) 11/28/2014 1456   LABSPEC 1.017 06/29/2020 1456   PHURINE 5.0 06/29/2020 1456   GLUCOSEU NEGATIVE 06/29/2020 1456   HGBUR LARGE (A) 06/29/2020 1456   BILIRUBINUR NEGATIVE 06/29/2020 1456   BILIRUBINUR Negative 11/28/2014 1456   KETONESUR NEGATIVE 06/29/2020 1456   PROTEINUR 100 (A) 06/29/2020 1456   NITRITE NEGATIVE 06/29/2020 1456   LEUKOCYTESUR NEGATIVE 06/29/2020 1456   CRITICAL CARE Performed by: Briant Cedar Atarah Cadogan  Total critical care time: 40 minutes  Critical care time was exclusive of separately billable procedures and treating other patients.  Critical care was necessary to treat or prevent imminent or life-threatening deterioration. Hepatorenal syndrome  Critical care was time spent personally by me on the following activities: development of treatment plan with patient and/or surrogate as well as nursing, discussions with consultants, evaluation of patient's response to treatment, examination of patient, obtaining history from patient or surrogate, ordering and performing treatments and interventions, ordering and review of laboratory studies, ordering and review of radiographic studies, pulse oximetry and re-evaluation of patient's condition.  Selena Swaminathan N Tija Biss D.O. [facility]  If 7PM-7AM, please contact overnight-coverage provider If 7AM-7PM, please contact day coverage provider [email or web address]  06/29/2020, 6:38 PM

## 2020-06-29 NOTE — ED Notes (Signed)
Pt linens changed

## 2020-06-29 NOTE — ED Triage Notes (Signed)
Pt to ED from home via EMS. Reports 1 week of inability to leave bed. Profound BLE edema and pain.

## 2020-06-29 NOTE — ED Notes (Signed)
Unable to find computer with working sig pad to complete blood consent. Pt signed paper consent, placed in patient chart with stickers.

## 2020-06-29 NOTE — ED Notes (Signed)
Pt given a cup of ice water

## 2020-06-30 ENCOUNTER — Encounter: Payer: Self-pay | Admitting: Internal Medicine

## 2020-06-30 DIAGNOSIS — Z9221 Personal history of antineoplastic chemotherapy: Secondary | ICD-10-CM | POA: Diagnosis not present

## 2020-06-30 DIAGNOSIS — N170 Acute kidney failure with tubular necrosis: Secondary | ICD-10-CM | POA: Diagnosis present

## 2020-06-30 DIAGNOSIS — R5381 Other malaise: Secondary | ICD-10-CM | POA: Diagnosis present

## 2020-06-30 DIAGNOSIS — Z7901 Long term (current) use of anticoagulants: Secondary | ICD-10-CM | POA: Diagnosis not present

## 2020-06-30 DIAGNOSIS — I2699 Other pulmonary embolism without acute cor pulmonale: Secondary | ICD-10-CM | POA: Diagnosis not present

## 2020-06-30 DIAGNOSIS — I480 Paroxysmal atrial fibrillation: Secondary | ICD-10-CM | POA: Diagnosis present

## 2020-06-30 DIAGNOSIS — I10 Essential (primary) hypertension: Secondary | ICD-10-CM | POA: Diagnosis present

## 2020-06-30 DIAGNOSIS — K7460 Unspecified cirrhosis of liver: Secondary | ICD-10-CM

## 2020-06-30 DIAGNOSIS — Z85828 Personal history of other malignant neoplasm of skin: Secondary | ICD-10-CM | POA: Diagnosis not present

## 2020-06-30 DIAGNOSIS — Z66 Do not resuscitate: Secondary | ICD-10-CM | POA: Diagnosis present

## 2020-06-30 DIAGNOSIS — K7581 Nonalcoholic steatohepatitis (NASH): Secondary | ICD-10-CM

## 2020-06-30 DIAGNOSIS — Z79899 Other long term (current) drug therapy: Secondary | ICD-10-CM | POA: Diagnosis not present

## 2020-06-30 DIAGNOSIS — E871 Hypo-osmolality and hyponatremia: Secondary | ICD-10-CM | POA: Diagnosis present

## 2020-06-30 DIAGNOSIS — Z9114 Patient's other noncompliance with medication regimen: Secondary | ICD-10-CM | POA: Diagnosis not present

## 2020-06-30 DIAGNOSIS — K219 Gastro-esophageal reflux disease without esophagitis: Secondary | ICD-10-CM | POA: Diagnosis present

## 2020-06-30 DIAGNOSIS — Z923 Personal history of irradiation: Secondary | ICD-10-CM | POA: Diagnosis not present

## 2020-06-30 DIAGNOSIS — N179 Acute kidney failure, unspecified: Secondary | ICD-10-CM | POA: Diagnosis not present

## 2020-06-30 DIAGNOSIS — K767 Hepatorenal syndrome: Secondary | ICD-10-CM | POA: Diagnosis present

## 2020-06-30 DIAGNOSIS — Z853 Personal history of malignant neoplasm of breast: Secondary | ICD-10-CM | POA: Diagnosis not present

## 2020-06-30 DIAGNOSIS — Z9119 Patient's noncompliance with other medical treatment and regimen: Secondary | ICD-10-CM | POA: Diagnosis not present

## 2020-06-30 DIAGNOSIS — R600 Localized edema: Secondary | ICD-10-CM

## 2020-06-30 DIAGNOSIS — R188 Other ascites: Secondary | ICD-10-CM | POA: Diagnosis present

## 2020-06-30 DIAGNOSIS — F17210 Nicotine dependence, cigarettes, uncomplicated: Secondary | ICD-10-CM | POA: Diagnosis present

## 2020-06-30 DIAGNOSIS — Z86711 Personal history of pulmonary embolism: Secondary | ICD-10-CM | POA: Diagnosis not present

## 2020-06-30 DIAGNOSIS — Z7189 Other specified counseling: Secondary | ICD-10-CM | POA: Diagnosis not present

## 2020-06-30 DIAGNOSIS — Z9049 Acquired absence of other specified parts of digestive tract: Secondary | ICD-10-CM | POA: Diagnosis not present

## 2020-06-30 DIAGNOSIS — D638 Anemia in other chronic diseases classified elsewhere: Secondary | ICD-10-CM | POA: Diagnosis present

## 2020-06-30 DIAGNOSIS — R531 Weakness: Secondary | ICD-10-CM | POA: Diagnosis present

## 2020-06-30 DIAGNOSIS — U071 COVID-19: Secondary | ICD-10-CM | POA: Diagnosis present

## 2020-06-30 LAB — COMPREHENSIVE METABOLIC PANEL
ALT: 26 U/L (ref 0–44)
AST: 36 U/L (ref 15–41)
Albumin: 2.5 g/dL — ABNORMAL LOW (ref 3.5–5.0)
Alkaline Phosphatase: 83 U/L (ref 38–126)
Anion gap: 11 (ref 5–15)
BUN: 54 mg/dL — ABNORMAL HIGH (ref 8–23)
CO2: 25 mmol/L (ref 22–32)
Calcium: 7.6 mg/dL — ABNORMAL LOW (ref 8.9–10.3)
Chloride: 99 mmol/L (ref 98–111)
Creatinine, Ser: 1.67 mg/dL — ABNORMAL HIGH (ref 0.44–1.00)
GFR, Estimated: 30 mL/min — ABNORMAL LOW (ref >60)
Glucose, Bld: 83 mg/dL (ref 70–99)
Potassium: 3.8 mmol/L (ref 3.5–5.1)
Sodium: 135 mmol/L (ref 135–145)
Total Bilirubin: 1.5 mg/dL — ABNORMAL HIGH (ref 0.3–1.2)
Total Protein: 4.6 g/dL — ABNORMAL LOW (ref 6.5–8.1)

## 2020-06-30 LAB — CREATININE, URINE, RANDOM: Creatinine, Urine: 130 mg/dL

## 2020-06-30 LAB — CBC
HCT: 35.9 % — ABNORMAL LOW (ref 36.0–46.0)
Hemoglobin: 11.9 g/dL — ABNORMAL LOW (ref 12.0–15.0)
MCH: 31.6 pg (ref 26.0–34.0)
MCHC: 33.1 g/dL (ref 30.0–36.0)
MCV: 95.2 fL (ref 80.0–100.0)
Platelets: 81 10*3/uL — ABNORMAL LOW (ref 150–400)
RBC: 3.77 MIL/uL — ABNORMAL LOW (ref 3.87–5.11)
RDW: 16.5 % — ABNORMAL HIGH (ref 11.5–15.5)
WBC: 6.6 10*3/uL (ref 4.0–10.5)
nRBC: 0 % (ref 0.0–0.2)

## 2020-06-30 LAB — SODIUM, URINE, RANDOM: Sodium, Ur: <10 mmol/L

## 2020-06-30 LAB — VITAMIN D 25 HYDROXY (VIT D DEFICIENCY, FRACTURES): Vit D, 25-Hydroxy: 34.43 ng/mL (ref 30–100)

## 2020-06-30 LAB — SARS CORONAVIRUS 2 (TAT 6-24 HRS): SARS Coronavirus 2: POSITIVE — AB

## 2020-06-30 LAB — VITAMIN B12: Vitamin B-12: 1337 pg/mL — ABNORMAL HIGH (ref 180–914)

## 2020-06-30 MED ORDER — SODIUM CHLORIDE 0.9 % IV SOLN: INTRAVENOUS | Status: DC

## 2020-06-30 MED ORDER — CHLORHEXIDINE GLUCONATE CLOTH 2 % EX PADS
6.0000 | MEDICATED_PAD | Freq: Every day | CUTANEOUS | Status: DC
  Administered 2020-06-30 – 2020-07-03 (×4): 6 via TOPICAL

## 2020-06-30 NOTE — Progress Notes (Addendum)
[facility] [place] NAME: [name]    MR#:  [id number]  DATE OF BIRTH:  Jun 27, 1935  SUBJECTIVE:  patient came in with increasing shortness of breath and weakness. She told me she could not get out of bed for two days. This is her third admission in January 2022.  Tells me she was having difficulty urinating. She tells me she was taking her Lasix however does not remember the dosing. There is issues with noncompliance. Lives with her ex-husband.  REVIEW OF SYSTEMS:   Review of Systems  Constitutional: Positive for malaise/fatigue. Negative for chills, fever and weight loss.  HENT: Negative for ear discharge, ear pain and nosebleeds.   Eyes: Negative for blurred vision, pain and discharge.  Respiratory: Positive for shortness of breath. Negative for sputum production, wheezing and stridor.   Cardiovascular: Positive for leg swelling. Negative for chest pain, palpitations, orthopnea and PND.  Gastrointestinal: Negative for abdominal pain, diarrhea, nausea and vomiting.  Genitourinary: Negative for frequency and urgency.  Musculoskeletal: Negative for back pain and joint pain.  Neurological: Positive for weakness. Negative for sensory change, speech change and focal weakness.  Psychiatric/Behavioral: Negative for depression and hallucinations. The patient is not nervous/anxious.    Tolerating Diet:yes Tolerating PT:   DRUG ALLERGIES:   Allergies  Allergen Reactions  . Gabapentin Itching  . Ivp Dye [Iodinated Diagnostic Agents] Itching    VITALS:  Blood pressure (!) 94/59, pulse 90, temperature 98 F (36.7 C), temperature source Oral, resp. rate 20, SpO2 95 %.  PHYSICAL EXAMINATION:   Physical Exam  GENERAL:  85 y.o.-year-old patient lying in the bed with no acute distress. chronically ill and debilitated. LUNGS: Normal breath sounds bilaterally, no wheezing, rales, rhonchi. No use of accessory muscles of respiration.   CARDIOVASCULAR: S1, S2 normal. No murmurs, rubs, or gallops.  ABDOMEN: Soft, nontender, nondistended. Cannot appreciate fluid thrill. EXTREMITIES:     NEUROLOGIC: No focal Motor or sensory deficits b/l.   PSYCHIATRIC:  patient is alert and oriented x 3.  SKIN as above  LABORATORY PANEL:  CBC Recent Labs  Lab 06/30/20 0520  WBC 6.6  HGB 11.9*  HCT 35.9*  PLT 81*    Chemistries  Recent Labs  Lab 06/29/20 1224 06/30/20 0415  NA 131* 135  K 4.4 3.8  CL 98 99  CO2 20* 25  GLUCOSE 113* 83  BUN 48* 54*  CREATININE 1.52* 1.67*  CALCIUM 7.7* 7.6*  MG 2.1  --   AST 72* 36  ALT 44 26  ALKPHOS 157* 83  BILITOT 1.4* 1.5*   Cardiac Enzymes No results for input(s): TROPONINI in the last 168 hours. RADIOLOGY:  DG Chest 2 View  Result Date: 06/29/2020 CLINICAL DATA:  Worsening lower extremity edema. EXAM: CHEST - 2 VIEW COMPARISON:  Chest radiograph dated 06/12/2020. FINDINGS: The heart remains enlarged. An airspace opacity in the right lower lung has decreased since prior exam and minimal right basilar atelectasis/airspace disease persists. There is a trace right pleural effusion. The left lung is clear. There is no pneumothorax. There is diffuse osseous demineralization. Degenerative changes are seen in the spine. IMPRESSION: 1. Improved airspace opacity in the right lower lung. 2. Trace right pleural effusion. Electronically Signed   By: Zerita Boers M.D.   On: 06/29/2020 13:46   CT HEAD WO CONTRAST  Result Date: 06/29/2020 CLINICAL DATA:  BILATERAL worsening lower extremity pain and weakness, history hypertension, breast cancer, atrial fibrillation, question stroke, history  cirrhosis EXAM: CT HEAD WITHOUT CONTRAST TECHNIQUE: Contiguous axial images were obtained from the base of the skull through the vertex without intravenous contrast. Sagittal and coronal MPR images reconstructed from axial data set. COMPARISON:  06/01/2020 FINDINGS: Brain: Generalized atrophy. Normal  ventricular morphology. No midline shift or mass effect. Small vessel chronic ischemic changes of deep cerebral white matter. No intracranial hemorrhage, mass lesion, evidence of acute infarction, or extra-axial fluid collection. Vascular: Atherosclerotic calcification of internal carotid arteries at skull base. No hyperdense vessels Skull: Intact Sinuses/Orbits: Clear Other: N/A IMPRESSION: Atrophy with small vessel chronic ischemic changes of deep cerebral white matter. No acute intracranial abnormalities. Electronically Signed   By: Lavonia Dana M.D.   On: 06/29/2020 16:42   US Abdomen Complete  Result Date: 06/29/2020 CLINICAL DATA:  Abdominal pain. EXAM: ABDOMEN ULTRASOUND COMPLETE COMPARISON:  CT dated June 12, 2020 FINDINGS: Gallbladder: The patient is status post prior cholecystectomy. Common bile duct: Diameter: 3 mm Liver: The liver surface is nodular. There is no discrete hepatic mass. Portal vein is patent on color Doppler imaging with normal direction of blood flow towards the liver. IVC: No abnormality visualized. Pancreas: Visualized portion unremarkable. Spleen: Size and appearance within normal limits. Right Kidney: Length: 10 cm. There is no hydronephrosis. There is a lower pole 1.7 cm cyst. Left Kidney: Length: 10.3 cm. Echogenicity within normal limits. No mass or hydronephrosis visualized. Abdominal aorta: No aneurysm visualized. Other findings: There is a small volume of ascites in the upper abdomen. There are bilateral pleural effusions. IMPRESSION: 1. Cirrhotic appearing liver without evidence for discrete hepatic mass. 2. Small volume ascites in the upper abdomen. 3. Incidentally noted are bilateral pleural effusions. Electronically Signed   By: Constance Holster M.D.   On: 06/29/2020 18:49   DG Pelvis Portable  Result Date: 06/29/2020 CLINICAL DATA:  Weakness. Unable to sleep the bed for 1 week. Lower extremity edema and pain. EXAM: PORTABLE PELVIS 1-2 VIEWS COMPARISON:  None.  FINDINGS: The cortical margins of the bony pelvis are intact. No fracture. Pubic symphysis and sacroiliac joints are congruent. Degenerative change of both hips with acetabular spurring, right greater than left. There is mild bilateral hip joint space narrowing. No evidence of a vascular necrosis, focal bone lesion or bony destruction. Surgical hardware in the lower lumbar spine is partially included. IMPRESSION: Mild osteoarthritis of both hips, right greater than left. Electronically Signed   By: Keith Rake M.D.   On: 06/29/2020 15:17   US Venous Img Lower Bilateral (DVT)  Result Date: 06/29/2020 CLINICAL DATA:  Edema EXAM: BILATERAL LOWER EXTREMITY VENOUS DOPPLER ULTRASOUND TECHNIQUE: Gray-scale sonography with compression, as well as color and duplex ultrasound, were performed to evaluate the deep venous system(s) from the level of the common femoral vein through the popliteal and proximal calf veins. COMPARISON:  June 01, 2020 FINDINGS: VENOUS Normal compressibility of the common femoral, superficial femoral, and popliteal veins, as well as the visualized calf veins. Visualized portions of profunda femoral vein and great saphenous vein unremarkable. No filling defects to suggest DVT on grayscale or color Doppler imaging. Doppler waveforms show normal direction of venous flow, normal respiratory plasticity and response to augmentation. OTHER There is nonspecific bilateral lower extremity edema. Limitations: none IMPRESSION: 1. No DVT. 2. There is nonspecific bilateral lower extremity edema. Electronically Signed   By: Constance Holster M.D.   On: 06/29/2020 18:45   ASSESSMENT AND PLAN:   EGYPT WELCOME is a 85 y.o. 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 Fib and PE on NOAC, and history of breast cancer who presents with weakness/fatigue along with lower extremity edema and AKI. Baseline appears normal. She endorses shortness of breath with ambulation.  She states that she  has had decreased urination in the last week.   She endorses compliance of medications.  However she did state that she has stopped taking her Lasix because she "I felt bad"  This is patients 3rd admission in Jan 2022  Acute kidney injury in setting of liver cirrhosis and poor compliance ?Hepatorenal syndrome -Worsening liver enzymes and hyponatremia in setting of noncompliance with medical med recommendations -Patient has been advised to follow-up outpatient with gastroenterologist for the last 12 years but she does not believe that she has liver cirrhosis -Checking PT and INR to calculate meld score -GI const with Dr. Andrey Farmer --recommends holding patient lasix and give albumin 25% 1 g/kg q6hrs (max of 100 grams in 24 hours) -Lasix has been discontinued -Resumed home spironolactone 25 mg daily --Nephrology consult with Dr Theador Hawthorne  Hypertension-controlled -Resumed home metoprolol 12.5 mg p.o. twice daily  Gen weakness/Debility -PT/OT to see pt -CT the head without contrast --no acute abnormality -Bilateral lower extremity ultrasound NEGATIV for DVT  NASH/Cirrhosis of liver with gastric varices (per record) ChronicTCP Ascites with recent paracentesis 06/13/2020 --follow GI rec  Bilateral subsegmental pulmonary embolism-resumed home apixaban 5 mg p.o. twice daily (dx Jan 2022)  Paroxysmal atrial fibrillation-apixaban 5 mg p.o. twice daily  Chronic Bilateral lower extremity venous changes-with redness however no evidence of cellulitis, this appears to be chronic in nature --pt used to follow wound clinic in 2019  Multiple medical problems with overall decline --- consult palliative care to help discuss goals of care.  DVT prophylaxis: Apixaban Code Status: full code d/w pt in the ER Diet: Heart healthy Family Communication: son [name] today Disposition Plan: Pending clinical course Consults called: Gastroenterology, Nephrology  Level of care:  Med-Surg Status is: Inpatient  Remains inpatient appropriate because:Inpatient level of care appropriate due to severity of illness   Dispo: The patient is from: Home              Anticipated d/c is to: TBD              Anticipated d/c date is: > 3 days              Patient currently is not medically stable to d/c.   Difficult to place patient No        TOTAL TIME TAKING CARE OF THIS PATIENT: 25 minutes.  >50% time spent on counselling and coordination of care  Note: This dictation was prepared with Dragon dictation along with smaller phrase technology. Any transcriptional errors that result from this process are unintentional.  Fritzi Mandes M.D    [facility]   CC: Primary care physician; Sallee Lange, NPPatient ID: [name], female   DOB: June 05, 1935, 85 y.o.   MRN: [id number]

## 2020-06-30 NOTE — ED Notes (Signed)
Patient set up eating lunch at this time

## 2020-06-30 NOTE — ED Notes (Addendum)
Patient given coffee, apple sauce, orange juice, and milk

## 2020-06-30 NOTE — Progress Notes (Signed)
Patient's son [name] updated on admission to unit and plan of care. Patient requesting to have cell phone charger and underwear brought in from home. Son to bring.

## 2020-06-30 NOTE — Evaluation (Signed)
Occupational Therapy Evaluation Patient Details Name: [name] MRN: [id number] DOB: [birth year] Today's Date: 06/30/2020    History of Present Illness Pt is an 85 y.o. female presenting to hospital 1/29 with worsening B LE pain and weakness.  Pt admitted with acute kidney injury in setting of liver cirrhosis and poor compliance, htn, and acute onset of weakness.  PMH includes htn, breast CA s/p R lumpectomy (currently in remission), a-fib, B PE (early Jan on eliquis), GERD, chronic intermittent HA's and nausea and LE edema.   Clinical Impression   Pt seen for OT evaluation this date. Prior to most recent hospitalization (1/12-1/15), pt was independent in all ADLs and functional mobility, living in a 1-story mobile home with ex-husband. Since discharge, pt reports requiring assistance for bathing from ex-husband. Per chart review, pt has been unable to walk for 4 days. Pt currently requires MAX A for LB dressing and MIN A for bed mobility, sit>stand transfers, and steadying when standing with RW d/t L LE pain. Pt deferred further mobility d/t L LE pain. Pt would benefit from continued skilled OT services to maximize return to PLOF and minimize risk of future falls, injury, caregiver burden, and readmission. Upon discharge, recommend SNF.     Follow Up Recommendations  SNF    Equipment Recommendations  3 in 1 bedside commode       Precautions / Restrictions Precautions Precautions: Fall Restrictions Weight Bearing Restrictions: No      Mobility Bed Mobility Overal bed mobility: Needs Assistance Bed Mobility: Supine to Sit;Sit to Supine Rolling: Modified independent (Device/Increase time)   Supine to sit: Min assist;HOB elevated Sit to supine: Min assist;HOB elevated   General bed mobility comments: increased time to perform d/t B LE pain    Transfers Overall transfer level: Needs assistance Equipment used: Rolling walker (2 wheeled) Transfers: Sit to/from Stand Sit to  Stand: Min assist         General transfer comment: x3 sit>stand transfers, with pt self-initiating TWB on L LE d/t pain    Balance Overall balance assessment: Needs assistance Sitting-balance support: No upper extremity supported;Feet unsupported Sitting balance-Leahy Scale: Good Sitting balance - Comments: Sitting EOB   Standing balance support: During functional activity;Bilateral upper extremity supported Standing balance-Leahy Scale: Poor Standing balance comment: UE reliance on RW and MIN A for steadying d/t patient initiated TWB of LLE. Pt deferred walking/steps d/t pain                           ADL either performed or assessed with clinical judgement   ADL Overall ADL's : Needs assistance/impaired                     Lower Body Dressing: Maximal assistance;Bed level Lower Body Dressing Details (indicate cue type and reason): to don socks             Functional mobility during ADLs: Minimal assistance General ADL Comments: Pt agreeable to x3 sit>stand transfers, however deferred walking/marching d/t LLE pain                  Pertinent Vitals/Pain Pain Assessment: No/denies pain (no pain at rest, c/o pain at L LE when standing) Faces Pain Scale: Hurts a little bit (2/10 at rest; 6-8/10 with activity) Pain Location: B LE's/feet Pain Descriptors / Indicators: Discomfort;Sore;Tender Pain Intervention(s): Limited activity within patient's tolerance     Hand Dominance Right   Extremity/Trunk Assessment Upper Extremity  Assessment Upper Extremity Assessment: Generalized weakness   Lower Extremity Assessment Lower Extremity Assessment: Generalized weakness (B LE's AROM limited d/t B LE pain)   Cervical / Trunk Assessment Cervical / Trunk Assessment: Normal   Communication Communication Communication: No difficulties   Cognition Arousal/Alertness: Awake/alert Behavior During Therapy: WFL for tasks assessed/performed Overall Cognitive  Status: Within Functional Limits for tasks assessed                                     General Comments  LLE reddness and edema. Orthostatics: supine 94/58, sitting 87/55            Home Living Family/patient expects to be discharged to:: Private residence Living Arrangements: Other (Comment) (Ex-husband) Available Help at Discharge: Family;Available 24 hours/day Type of Home: Mobile home Home Access: Stairs to enter Entrance Stairs-Number of Steps: 4 steps with L railing from front; 1 step with middle rail from back entrance   Home Layout: One level     Bathroom Shower/Tub: Occupational psychologist: Handicapped height     Home Equipment: None   Additional Comments: Pt reports her ex-husband owns cane and walker.      Prior Functioning/Environment Level of Independence: Needs assistance    ADL's / Homemaking Assistance Needed: Prior to previous hospitalization this month, pt was independent with ADLs/household management. Since d/c, pt was receiving assistance from ex-husband for bathing            OT Problem List: Decreased strength;Decreased activity tolerance;Impaired balance (sitting and/or standing);Decreased safety awareness;Pain;Impaired UE functional use;Decreased knowledge of use of DME or AE;Decreased knowledge of precautions      OT Treatment/Interventions: Self-care/ADL training;Therapeutic exercise;Energy conservation;DME and/or AE instruction;Therapeutic activities;Balance training;Patient/family education;Manual therapy    OT Goals(Current goals can be found in the care plan section) Acute Rehab OT Goals Patient Stated Goal: to improve pain and walk OT Goal Formulation: With patient Time For Goal Achievement: 07/14/20 Potential to Achieve Goals: Good ADL Goals Pt Will Perform Grooming: with supervision;standing Pt Will Transfer to Toilet: with supervision;stand pivot transfer;bedside commode Pt Will Perform Toileting -  Clothing Manipulation and hygiene: with min guard assist;sit to/from stand  OT Frequency: Min 2X/week    AM-PAC OT "6 Clicks" Daily Activity     Outcome Measure Help from another person eating meals?: None Help from another person taking care of personal grooming?: A Little Help from another person toileting, which includes using toliet, bedpan, or urinal?: A Lot Help from another person bathing (including washing, rinsing, drying)?: A Lot Help from another person to put on and taking off regular upper body clothing?: A Little Help from another person to put on and taking off regular lower body clothing?: A Lot 6 Click Score: 16   End of Session Equipment Utilized During Treatment: Gait belt;Rolling walker Nurse Communication: Mobility status  Activity Tolerance: Patient tolerated treatment well Patient left: in bed;with call bell/phone within reach  OT Visit Diagnosis: Repeated falls (R29.6);Muscle weakness (generalized) (M62.81)                Time: 1443-1510 OT Time Calculation (min): 27 min Charges:  OT General Charges $OT Visit: 1 Visit OT Evaluation $OT Eval Moderate Complexity: 1 Mod OT Treatments $Therapeutic Activity: 8-22 mins  Fredirick Maudlin, OTR/L [place]

## 2020-06-30 NOTE — Progress Notes (Signed)
   06/30/20 1716  Clinical Encounter Type  Visited With Patient and family together  Visit Type Initial  Referral From Nurse  Consult/Referral To Chaplain  Spiritual Encounters  Spiritual Needs Prayer;Emotional   Chaplain Carmie Lanpher received an OR for room [address]A, [name]. I called Pt room, no answer. I called nurses station to verify request of Pt, nurse stated, I believe she just wanted prayer. I advised I tried to call her room but there is no answer. The nurse checked Pt room for me and advised me that the  Pt was sleeping. I called Pt's point of contact, which is her son to see if there was anything else our department may do. Pt son stated, "Just pray for her, and I thank you for calling."

## 2020-06-30 NOTE — Progress Notes (Signed)
Patient arrived to unit in from ER in stable condition via stretcher.

## 2020-06-30 NOTE — ED Notes (Signed)
PT at bedside with patient

## 2020-06-30 NOTE — Evaluation (Signed)
Physical Therapy Evaluation Patient Details Name: [name] MRN: [id number] DOB: 1936-03-24 Today's Date: 06/30/2020   History of Present Illness  Pt is an 85 y.o. female presenting to hospital 1/29 with worsening B LE pain and weakness.  Pt admitted with acute kidney injury in setting of liver cirrhosis and poor compliance, htn, and acute onset of weakness.  PMH includes htn, breast CA s/p R lumpectomy (currently in remission), a-fib, B PE (early Jan on eliquis), GERD, chronic intermittent HA's and nausea and LE edema.  Clinical Impression  Prior to hospital admission, pt was independent with ambulation; lives with ex-husband in 1 level home with steps to enter.  Pt requesting to use bedpan upon PT arrival d/t needing to have bowel movement (pt declined to get OOB to use toilet); modified independent logrolling in bed to perform.  Currently pt is mod assist with bed mobility semi-supine to/from sitting edge of bed; pt declined to stand d/t pain in B feet but was able to laterally scoot to L along stretcher bed with increased effort and time prior to laying back down.  Pt would benefit from skilled PT to address noted impairments and functional limitations (see below for any additional details).  Upon hospital discharge, pt would benefit from STR.    Follow Up Recommendations SNF    Equipment Recommendations  Rolling walker with 5" wheels;3in1 (PT)    Recommendations for Other Services OT consult     Precautions / Restrictions Precautions Precautions: Fall Restrictions Weight Bearing Restrictions: No      Mobility  Bed Mobility Overal bed mobility: Needs Assistance Bed Mobility: Supine to Sit;Sit to Supine;Rolling Rolling: Modified independent (Device/Increase time)   Supine to sit: Mod assist;HOB elevated (assist for trunk; pt using UE's to assist with moving B LE's towards edge of bed) Sit to supine: Mod assist;HOB elevated (assist for trunk and B LE's)   General bed  mobility comments: increased time to perform d/t B LE pain    Transfers                 General transfer comment: pt declined d/t B foot pain  Ambulation/Gait                Stairs            Wheelchair Mobility    Modified Rankin (Stroke Patients Only)       Balance Overall balance assessment: Needs assistance Sitting-balance support: No upper extremity supported;Feet supported Sitting balance-Leahy Scale: Good Sitting balance - Comments: steady sitting reaching within BOS                                     Pertinent Vitals/Pain Pain Assessment: Faces Faces Pain Scale: Hurts a little bit (2/10 at rest; 6-8/10 with activity) Pain Location: B LE's/feet Pain Descriptors / Indicators: Discomfort;Sore;Tender Pain Intervention(s): Limited activity within patient's tolerance;Monitored during session;Repositioned  Vitals (HR and O2) stable and WFL throughout treatment session.    Home Living Family/patient expects to be discharged to:: Private residence Living Arrangements: Other (Comment) (Ex-husband)   Type of Home: Mobile home Home Access: Stairs to enter   Entrance Stairs-Number of Steps: 4 steps with L railing from front; 1 step with middle rail from back entrance Home Layout: One level Home Equipment: None Additional Comments: Pt reports her ex-husband owns cane and walker.    Prior Function Level of Independence: Independent  Hand Dominance        Extremity/Trunk Assessment   Upper Extremity Assessment Upper Extremity Assessment: Generalized weakness    Lower Extremity Assessment Lower Extremity Assessment: Generalized weakness (B LE's AROM limited d/t B LE pain)    Cervical / Trunk Assessment Cervical / Trunk Assessment: Normal  Communication   Communication: No difficulties  Cognition Arousal/Alertness: Awake/alert Behavior During Therapy: WFL for tasks assessed/performed Overall Cognitive  Status: Within Functional Limits for tasks assessed                                        General Comments   Nursing cleared pt for participation in physical therapy.  Pt agreeable to PT session.    Exercises     Assessment/Plan    PT Assessment Patient needs continued PT services  PT Problem List Decreased strength;Decreased activity tolerance;Decreased balance;Decreased mobility;Decreased knowledge of use of DME;Decreased knowledge of precautions;Pain       PT Treatment Interventions DME instruction;Gait training;Stair training;Functional mobility training;Therapeutic activities;Therapeutic exercise;Balance training;Patient/family education    PT Goals (Current goals can be found in the Care Plan section)  Acute Rehab PT Goals Patient Stated Goal: to improve pain and walking PT Goal Formulation: With patient Time For Goal Achievement: 07/14/20 Potential to Achieve Goals: Good    Frequency Min 2X/week   Barriers to discharge Decreased caregiver support      Co-evaluation               AM-PAC PT "6 Clicks" Mobility  Outcome Measure Help needed turning from your back to your side while in a flat bed without using bedrails?: None Help needed moving from lying on your back to sitting on the side of a flat bed without using bedrails?: A Lot Help needed moving to and from a bed to a chair (including a wheelchair)?: Total Help needed standing up from a chair using your arms (e.g., wheelchair or bedside chair)?: Total Help needed to walk in hospital room?: Total Help needed climbing 3-5 steps with a railing? : Total 6 Click Score: 10    End of Session   Activity Tolerance: Patient limited by pain Patient left: in bed;with call bell/phone within reach;Other (comment) (B railings elevated on stretcher bed) Nurse Communication: Mobility status;Precautions PT Visit Diagnosis: Muscle weakness (generalized) (M62.81);History of falling (Z91.81);Difficulty in  walking, not elsewhere classified (R26.2);Other abnormalities of gait and mobility (R26.89);Pain Pain - Right/Left: Right Pain - part of body: Ankle and joints of foot    Time: 1202-1232 PT Time Calculation (min) (ACUTE ONLY): 30 min   Charges:   PT Evaluation $PT Eval Low Complexity: 1 Low PT Treatments $Therapeutic Activity: 8-22 mins       Leitha Bleak, PT 06/30/20, 2:20 PM

## 2020-06-30 NOTE — Consult Note (Signed)
Consultation  Referring Provider:  Hospitalist Admit date: 1/29 Consult date: 1/29         Reason for Consultation:    Cirrhotic and AKI         HPI:   [name] is a 85 y.o. 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 Fib and PE on NOAC, and history of breast cancer who presents with weakness/fatigue along with lower extremity edema and AKI. Baseline appears normal. Hard to ascertain exactly how she is taking her medications but per chart review she has been taking 40 of lasix and 50 of aldactone. She has a history of medicine non-compliance. She endorses taking NSAIDS every other day for arthritis pain. No other significant GI symptoms. She hasn't had an EGD or recent colonoscopy. She saw [facility] in 2018 and [place] in 2017 but hasn't followed up since then. She is scheduled for an appointment later in February. Denies alcohol use or smoking. She states that she feels like she has to urinate but nothing comes out. She endorses lower abdominal pain. She has severe chronic venous changes on her lower extremities.  Past Medical History:  Diagnosis Date  . Arthritis    lower back, knees  . Breast cancer ([place]) 2011   RT LUMPECTOMY  . Chemotherapy follow-up examination 2011   RT BREAST CANCER  . Cirrhosis ([place])   . Personal history of chemotherapy   . Personal history of radiation therapy   . Radiation 2011   RT BREAST CANCER  . Skin cancer   . Wears dentures    full upper, partial lower    Past Surgical History:  Procedure Laterality Date  . BREAST BIOPSY Right 2014   RT CORE W/CLIP - NEG  . BREAST EXCISIONAL BIOPSY Right 2011   breast ca  . CATARACT EXTRACTION W/PHACO Right 04/15/2016   Procedure: CATARACT EXTRACTION PHACO AND INTRAOCULAR LENS PLACEMENT (IOC);  Surgeon: Leandrew Koyanagi, MD;  Location: [facility];  Service: Ophthalmology;  Laterality: Right;  . CATARACT EXTRACTION W/PHACO Left 07/01/2016   Procedure: CATARACT  EXTRACTION PHACO AND INTRAOCULAR LENS PLACEMENT (IOC);  Surgeon: Leandrew Koyanagi, MD;  Location: [facility];  Service: Ophthalmology;  Laterality: Left;  Left eye  . CHOLECYSTECTOMY      Family History  Problem Relation Age of Onset  . Cancer Father        prostate  . Cancer Brother        prostate  . Breast cancer Neg Hx   States no family history of liver disease  Social History   Tobacco Use  . Smoking status: Current Every Day Smoker    Packs/day: 0.50    Types: Cigarettes    Last attempt to quit: 04/28/2014    Years since quitting: 6.1  . Smokeless tobacco: Former Network engineer Use Topics  . Alcohol use: No    Alcohol/week: 0.0 standard drinks    Comment: occasional glass of wine  . Drug use: No    Prior to Admission medications   Medication Sig Start Date End Date Taking? Authorizing Provider  apixaban (ELIQUIS) 5 MG TABS tablet Take 1 tablet (5 mg total) by mouth 2 (two) times daily. Patient taking differently: Take 5 mg by mouth 2 (two) times daily. Take 10 mg by mouth twice daily for 6 days, then stop 06/10/20  Yes Adhikari, Tamsen Meek, MD  furosemide (LASIX) 40 MG tablet Take 1 tablet (40 mg total) by mouth daily. 06/15/20 07/15/20 Yes  Wyvonnia Dusky, MD  potassium chloride (KLOR-CON) 10 MEQ tablet Take 1 tablet (10 mEq total) by mouth daily. 06/15/20 07/15/20 Yes Wyvonnia Dusky, MD  spironolactone (ALDACTONE) 25 MG tablet Take 25 mg by mouth daily. 06/24/20  Yes [provider]  apixaban (ELIQUIS) 5 MG TABS tablet Take 10 mg by mouth 2 (two) times daily.    [provider]  calcium carbonate (OS-CAL) 1250 (500 Ca) MG chewable tablet Chew 1 tablet by mouth daily.    [provider]  Cholecalciferol 25 MCG (1000 UT) tablet Take 1,000 Units by mouth daily.    [provider]  meloxicam (MOBIC) 15 MG tablet Take 15 mg by mouth daily as needed for pain.    [provider]  metoprolol tartrate (LOPRESSOR) 25 MG  tablet Take 0.5 tablets (12.5 mg total) by mouth 2 (two) times daily. 06/04/20   Shelly Coss, MD  Multiple Vitamins-Minerals (CENTRUM SILVER 50+WOMEN) TABS Take 1 tablet by mouth daily.    [provider]  pantoprazole (PROTONIX) 40 MG tablet Take 1 tablet (40 mg total) by mouth daily. 06/04/20   Shelly Coss, MD    Current Facility-Administered Medications  Medication Dose Route Frequency Provider Last Rate Last Admin  . acetaminophen (TYLENOL) tablet 325 mg  325 mg Oral Q6H PRN Cox, Amy N, DO   325 mg at 06/29/20 2108   Or  . acetaminophen (TYLENOL) suppository 325 mg  325 mg Rectal Q6H PRN Cox, Amy N, DO      . albumin human 25 % solution 25 g  25 g Intravenous Q6H Cox, Amy N, DO 60 mL/hr at 06/30/20 0545 25 g at 06/30/20 0545  . apixaban (ELIQUIS) tablet 5 mg  5 mg Oral BID Cox, Amy N, DO   5 mg at 06/30/20 0944  . metoprolol tartrate (LOPRESSOR) tablet 12.5 mg  12.5 mg Oral BID Cox, Amy N, DO   12.5 mg at 06/30/20 0942  . ondansetron (ZOFRAN) tablet 4 mg  4 mg Oral Q6H PRN Cox, Amy N, DO       Or  . ondansetron (ZOFRAN) injection 4 mg  4 mg Intravenous Q6H PRN Cox, Amy N, DO   4 mg at 06/29/20 2145  . pantoprazole (PROTONIX) EC tablet 40 mg  40 mg Oral Daily Cox, Amy N, DO   40 mg at 06/30/20 0932   Current Outpatient Medications  Medication Sig Dispense Refill  . apixaban (ELIQUIS) 5 MG TABS tablet Take 1 tablet (5 mg total) by mouth 2 (two) times daily. (Patient taking differently: Take 5 mg by mouth 2 (two) times daily. Take 10 mg by mouth twice daily for 6 days, then stop) 60 tablet 0  . furosemide (LASIX) 40 MG tablet Take 1 tablet (40 mg total) by mouth daily. 30 tablet 0  . potassium chloride (KLOR-CON) 10 MEQ tablet Take 1 tablet (10 mEq total) by mouth daily. 30 tablet 0  . spironolactone (ALDACTONE) 25 MG tablet Take 25 mg by mouth daily.    Marland Kitchen apixaban (ELIQUIS) 5 MG TABS tablet Take 10 mg by mouth 2 (two) times daily.    . calcium carbonate (OS-CAL) 1250 (500 Ca)  MG chewable tablet Chew 1 tablet by mouth daily.    . Cholecalciferol 25 MCG (1000 UT) tablet Take 1,000 Units by mouth daily.    . meloxicam (MOBIC) 15 MG tablet Take 15 mg by mouth daily as needed for pain.    . metoprolol tartrate (LOPRESSOR) 25 MG tablet  Take 0.5 tablets (12.5 mg total) by mouth 2 (two) times daily. 60 tablet 1  . Multiple Vitamins-Minerals (CENTRUM SILVER 50+WOMEN) TABS Take 1 tablet by mouth daily.    . pantoprazole (PROTONIX) 40 MG tablet Take 1 tablet (40 mg total) by mouth daily. 30 tablet 0    Allergies as of 06/29/2020 - Review Complete 06/29/2020  Allergen Reaction Noted  . Gabapentin Itching 11/30/2017  . Ivp dye [iodinated diagnostic agents] Itching 04/09/2016     Review of Systems:    All systems reviewed and negative except where noted in HPI.  Review of Systems  Constitutional: Negative for chills and fever.  Respiratory: Negative for cough.   Cardiovascular: Negative for chest pain.  Gastrointestinal: Positive for abdominal pain. Negative for blood in stool, constipation, diarrhea, melena, nausea and vomiting.  Genitourinary: Negative for dysuria.  Musculoskeletal: Positive for joint pain.  Skin: Positive for rash.  Neurological: Positive for weakness.  Psychiatric/Behavioral: Negative for substance abuse.  All other systems reviewed and are negative.    Physical Exam:  Vital signs in last 24 hours: Temp:  [97.8 F (36.6 C)-98 F (36.7 C)] 98 F (36.7 C) (01/29 2310) Pulse Rate:  [84-110] 90 (01/30 0930) Resp:  [10-23] 11 (01/30 1000) BP: (79-127)/(44-92) 94/64 (01/30 1000) SpO2:  [92 %-99 %] 95 % (01/30 0930)   General:   Pleasant in NAD Head:  Normocephalic and atraumatic. Eyes:   No icterus.   Conjunctiva pink. Mouth: Mucosa pink moist, no lesions. Neck:  Supple; no masses felt Lungs:  No respiratory distress Abdomen:   Flat, soft, nondistended, some lower abdominal pain Rectal:  Not performed.  Msk:  Severe chronic venous  changes on lower extremities. Neurologic:  Alert and  oriented x4; No focal deficits Skin:  Lower extremity changes of venous insufficiency Psych:  Alert and cooperative. Normal affect.  LAB RESULTS: Recent Labs    06/29/20 1224 06/30/20 0520  WBC 9.6 6.6  HGB 16.0* 11.9*  HCT 48.1* 35.9*  PLT 136* 81*   BMET Recent Labs    06/29/20 1224 06/30/20 0415  NA 131* 135  K 4.4 3.8  CL 98 99  CO2 20* 25  GLUCOSE 113* 83  BUN 48* 54*  CREATININE 1.52* 1.67*  CALCIUM 7.7* 7.6*   LFT Recent Labs    06/30/20 0415  PROT 4.6*  ALBUMIN 2.5*  AST 36  ALT 26  ALKPHOS 83  BILITOT 1.5*   PT/INR Recent Labs    06/29/20 2130  LABPROT 13.7  INR 1.1    STUDIES: DG Chest 2 View  Result Date: 06/29/2020 CLINICAL DATA:  Worsening lower extremity edema. EXAM: CHEST - 2 VIEW COMPARISON:  Chest radiograph dated 06/12/2020. FINDINGS: The heart remains enlarged. An airspace opacity in the right lower lung has decreased since prior exam and minimal right basilar atelectasis/airspace disease persists. There is a trace right pleural effusion. The left lung is clear. There is no pneumothorax. There is diffuse osseous demineralization. Degenerative changes are seen in the spine. IMPRESSION: 1. Improved airspace opacity in the right lower lung. 2. Trace right pleural effusion. Electronically Signed   By: Zerita Boers M.D.   On: 06/29/2020 13:46   CT HEAD WO CONTRAST  Result Date: 06/29/2020 CLINICAL DATA:  BILATERAL worsening lower extremity pain and weakness, history hypertension, breast cancer, atrial fibrillation, question stroke, history cirrhosis EXAM: CT HEAD WITHOUT CONTRAST TECHNIQUE: Contiguous axial images were obtained from the base of the skull through the vertex without intravenous contrast. Sagittal and  coronal MPR images reconstructed from axial data set. COMPARISON:  06/01/2020 FINDINGS: Brain: Generalized atrophy. Normal ventricular morphology. No midline shift or mass effect.  Small vessel chronic ischemic changes of deep cerebral white matter. No intracranial hemorrhage, mass lesion, evidence of acute infarction, or extra-axial fluid collection. Vascular: Atherosclerotic calcification of internal carotid arteries at skull base. No hyperdense vessels Skull: Intact Sinuses/Orbits: Clear Other: N/A IMPRESSION: Atrophy with small vessel chronic ischemic changes of deep cerebral white matter. No acute intracranial abnormalities. Electronically Signed   By: Lavonia Dana M.D.   On: 06/29/2020 16:42   US Abdomen Complete  Result Date: 06/29/2020 CLINICAL DATA:  Abdominal pain. EXAM: ABDOMEN ULTRASOUND COMPLETE COMPARISON:  CT dated June 12, 2020 FINDINGS: Gallbladder: The patient is status post prior cholecystectomy. Common bile duct: Diameter: 3 mm Liver: The liver surface is nodular. There is no discrete hepatic mass. Portal vein is patent on color Doppler imaging with normal direction of blood flow towards the liver. IVC: No abnormality visualized. Pancreas: Visualized portion unremarkable. Spleen: Size and appearance within normal limits. Right Kidney: Length: 10 cm. There is no hydronephrosis. There is a lower pole 1.7 cm cyst. Left Kidney: Length: 10.3 cm. Echogenicity within normal limits. No mass or hydronephrosis visualized. Abdominal aorta: No aneurysm visualized. Other findings: There is a small volume of ascites in the upper abdomen. There are bilateral pleural effusions. IMPRESSION: 1. Cirrhotic appearing liver without evidence for discrete hepatic mass. 2. Small volume ascites in the upper abdomen. 3. Incidentally noted are bilateral pleural effusions. Electronically Signed   By: Constance Holster M.D.   On: 06/29/2020 18:49   DG Pelvis Portable  Result Date: 06/29/2020 CLINICAL DATA:  Weakness. Unable to sleep the bed for 1 week. Lower extremity edema and pain. EXAM: PORTABLE PELVIS 1-2 VIEWS COMPARISON:  None. FINDINGS: The cortical margins of the bony pelvis are  intact. No fracture. Pubic symphysis and sacroiliac joints are congruent. Degenerative change of both hips with acetabular spurring, right greater than left. There is mild bilateral hip joint space narrowing. No evidence of a vascular necrosis, focal bone lesion or bony destruction. Surgical hardware in the lower lumbar spine is partially included. IMPRESSION: Mild osteoarthritis of both hips, right greater than left. Electronically Signed   By: Keith Rake M.D.   On: 06/29/2020 15:17   US Venous Img Lower Bilateral (DVT)  Result Date: 06/29/2020 CLINICAL DATA:  Edema EXAM: BILATERAL LOWER EXTREMITY VENOUS DOPPLER ULTRASOUND TECHNIQUE: Gray-scale sonography with compression, as well as color and duplex ultrasound, were performed to evaluate the deep venous system(s) from the level of the common femoral vein through the popliteal and proximal calf veins. COMPARISON:  June 01, 2020 FINDINGS: VENOUS Normal compressibility of the common femoral, superficial femoral, and popliteal veins, as well as the visualized calf veins. Visualized portions of profunda femoral vein and great saphenous vein unremarkable. No filling defects to suggest DVT on grayscale or color Doppler imaging. Doppler waveforms show normal direction of venous flow, normal respiratory plasticity and response to augmentation. OTHER There is nonspecific bilateral lower extremity edema. Limitations: none IMPRESSION: 1. No DVT. 2. There is nonspecific bilateral lower extremity edema. Electronically Signed   By: Constance Holster M.D.   On: 06/29/2020 18:45       Impression / Plan:   85 y/o lady with NASH cirrhosis presenting with weakness and decreased urine output and AKI. Current MELD-Na is 17. AKI good be due to ATN or NSAID use. Less likely to be hepatorenal.  - recommend  getting nephrology on board - hold all diuretics - continue with albumin 1 gram/kg 25% q6 hours (max of 100 grams daily) - renal ultrasound - Daily CMP and  INR - monitor daily I/O's - avoid hypotension so would D/C beta-blockers currently - avoid nephrotoxic agents - diagnostic paracentesis if able to get enough fluid - wound care - improve nutrition, needs increased protein  Will continue to follow. Please call with any questions or concerns.  Raylene Miyamoto MD, MPH [place]

## 2020-06-30 NOTE — Progress Notes (Signed)
   06/30/20 2020  Clinical Encounter Type  Visited With Patient  Visit Type Follow-up  Referral From Nurse  Consult/Referral To Chaplain  Spiritual Encounters  Spiritual Needs Prayer;Emotional  Chaplain Chen Saadeh followed up with Pt in room 122A, [name]. I called Pt again at approximately 8:00 pm. Pt answered the phone and she allowed me to pray with her and share some encouraging words. I let Pt know our services are always available.

## 2020-06-30 NOTE — ED Notes (Signed)
Occupational therapy with patient at this time

## 2020-06-30 NOTE — Consult Note (Signed)
[name] MRN: [id number] DOB/AGE: 02/04/1936 85 y.o. Primary Care Physician:Gauger, Victoriano Lain, NP Admit date: 06/29/2020 Chief Complaint:  Chief Complaint  Patient presents with  . Edema   HPI: Patient is a 85 year old Caucasian female with a past medical history of cirrhosis, paroxysmal atrial fibrillation, debility, pulmonary embolism who came to the ER with chief complaint of weakness.  History of present illness date backs to past few days when patient had onset of weakness, it was progressive in nature. Patient also complains of shortness of breath and exercise Patient also complains of decreased in the amount of urination Patient did give a history that she was taking her medications as prescribed but on further medication she did not take her Lasix  Patient does give a history of NSAID use-patient was taking Advil and patient is on meloxicam as well No history of hematuria No history of fever cough or chills On questioning about patient swelling patient says legs are now much better than before   Past Medical History:  Diagnosis Date  . Arthritis    lower back, knees  . Breast cancer ([place]) 2011   RT LUMPECTOMY  . Chemotherapy follow-up examination 2011   RT BREAST CANCER  . Cirrhosis ([place])   . Personal history of chemotherapy   . Personal history of radiation therapy   . Radiation 2011   RT BREAST CANCER  . Skin cancer   . Wears dentures    full upper, partial lower        Family History  Problem Relation Age of Onset  . Cancer Father        prostate  . Cancer Brother        prostate  . Breast cancer Neg Hx     Social History:  reports that she has been smoking cigarettes. She has been smoking about 0.50 packs per day. She has quit using smokeless tobacco. She reports that she does not drink alcohol and does not use drugs.   Allergies:  Allergies  Allergen Reactions  . Gabapentin Itching  . Ivp Dye [Iodinated Diagnostic Agents] Itching     (Not in a hospital admission)      QZR:AQTMA from the symptoms mentioned above,there are no other symptoms referable to all systems reviewed.  Marland Kitchen apixaban  5 mg Oral BID  . metoprolol tartrate  12.5 mg Oral BID  . pantoprazole  40 mg Oral Daily     Physical Exam: Vital signs in last 24 hours: Temp:  [97.8 F (36.6 C)-98 F (36.7 C)] 98 F (36.7 C) (01/29 2310) Pulse Rate:  [84-110] 90 (01/30 0930) Resp:  [10-23] 20 (01/30 1317) BP: (79-127)/(44-92) 94/59 (01/30 1317) SpO2:  [92 %-99 %] 95 % (01/30 0930) Weight change:     Intake/Output from previous day: No intake/output data recorded. No intake/output data recorded.   Physical Exam: General- pt is awake,alert, oriented to time place and person Resp- No acute REsp distress, CTA B/L NO Rhonchi CVS- S1S2 regular in rate and rhythm GIT- BS+, soft, NT, ND EXT-trace LE Edema, no Cyanosis Patient does have ecchymosis in the left lower extremity CNS- CN 2-12 grossly intact. Moving all 4 extremities Psych- normal mood and affect    Lab Results: CBC Recent Labs    06/29/20 1224 06/30/20 0520  WBC 9.6 6.6  HGB 16.0* 11.9*  HCT 48.1* 35.9*  PLT 136* 81*    BMET Recent Labs    06/29/20 1224 06/30/20 0415  NA 131* 135  K 4.4  3.8  CL 98 99  CO2 20* 25  GLUCOSE 113* 83  BUN 48* 54*  CREATININE 1.52* 1.67*  CALCIUM 7.7* 7.6*    Creatinine trend 2022  1.1==>1.5==>1.6     MICRO Recent Results (from the past 240 hour(s))  SARS CORONAVIRUS 2 (TAT 6-24 HRS) Nasopharyngeal Nasopharyngeal Swab     Status: Abnormal   Collection Time: 06/29/20 12:24 PM   Specimen: Nasopharyngeal Swab  Result Value Ref Range Status   SARS Coronavirus 2 POSITIVE (A) NEGATIVE Final    Comment: (NOTE) SARS-CoV-2 target nucleic acids are DETECTED.  The SARS-CoV-2 RNA is generally detectable in upper and lower respiratory specimens during the acute phase of infection. Positive results are indicative of the presence of  SARS-CoV-2 RNA. Clinical correlation with patient history and other diagnostic information is  necessary to determine patient infection status. Positive results do not rule out bacterial infection or co-infection with other viruses.  The expected result is Negative.  Fact Sheet for Patients: SugarRoll.be  Fact Sheet for Healthcare Providers: https://www.woods-mathews.com/  This test is not yet approved or cleared by the [place] FDA and  has been authorized for detection and/or diagnosis of SARS-CoV-2 by FDA under an Emergency Use Authorization (EUA). This EUA will remain  in effect (meaning this test can be used) for the duration of the COVID-19 declaration under Section 564(b)(1) of the Act, 21 U. S.C. section 360bbb-3(b)(1), unless the authorization is terminated or revoked sooner.   Performed at [facility], [place] [address], [place], [place] [address]       Lab Results  Component Value Date   CALCIUM 7.6 (L) 06/30/2020    Abdominal ultrasound was reviewed Right Kidney: Length: 10 cm. There is no hydronephrosis. There is a lower pole 1.7 cm cyst.  Left Kidney: Length: 10.3 cm. Echogenicity within normal limits. No mass or hydronephrosis visualized.   Impression:  [name] a 85 year old Caucasian female with a past medical history of cirrhosis/ASH cirrhosis (confirmed on biopsy), atrial fibrillation and PE on NOAC and history of breast cancer who came to the ER with chief complaint of weakness/fatigue  Patient was diagnosed with AKI and admitted on June 30, 2020   1)Renal  AKI secondary to ATN  Patient etiology of AKI includes prerenal versus hepatorenal as well  Data in favor of prerenal/ATN is 1.-Patient was using NSAIDs 2.-Patient was hypotensive at the time of presentation 3-Patient is on RAS blockers such as spironolactone and diuretics as an outpatient 4 patient was hypovolemic at the time of  presentation-data in favor of hypovolemia as the patient hemoglobin was 16   Hepatorenal is less likely     2) hypotension Patient blood pressure stable   3)Anemia of chronic disease HGb at goal (9--11)   4) cirrhosis GI and primary team are following   5) history of pulmonary embolism and atrial fibrillation Patient is on anticoagulation   6) electrolytes  normokalemic NOrmonatremic   7)Acid base Co2 is now at goal It was low earlier    Plan:  Agree with IV albumin to help with intravascular volume expansion We will ask for renal ultrasound. We will ask for fractional excretion of sodium. We will start patient on gentle IV fluids. We will follow Chem-7.     [name] s Theador Hawthorne 06/30/2020, 1:52 PM

## 2020-07-01 LAB — GLUCOSE, CAPILLARY: Glucose-Capillary: 99 mg/dL (ref 70–99)

## 2020-07-01 LAB — BASIC METABOLIC PANEL
Anion gap: 11 (ref 5–15)
BUN: 62 mg/dL — ABNORMAL HIGH (ref 8–23)
CO2: 23 mmol/L (ref 22–32)
Calcium: 7.8 mg/dL — ABNORMAL LOW (ref 8.9–10.3)
Chloride: 99 mmol/L (ref 98–111)
Creatinine, Ser: 1.99 mg/dL — ABNORMAL HIGH (ref 0.44–1.00)
GFR, Estimated: 25 mL/min — ABNORMAL LOW (ref >60)
Glucose, Bld: 121 mg/dL — ABNORMAL HIGH (ref 70–99)
Potassium: 3.8 mmol/L (ref 3.5–5.1)
Sodium: 133 mmol/L — ABNORMAL LOW (ref 135–145)

## 2020-07-01 MED ORDER — ADULT MULTIVITAMIN W/MINERALS CH
1.0000 | ORAL_TABLET | Freq: Every day | ORAL | Status: DC
  Administered 2020-07-02 – 2020-07-03 (×2): 1 via ORAL
  Filled 2020-07-01 (×2): qty 1

## 2020-07-01 MED ORDER — ENSURE ENLIVE PO LIQD
237.0000 mL | Freq: Three times a day (TID) | ORAL | Status: DC
  Administered 2020-07-01 – 2020-07-03 (×4): 237 mL via ORAL

## 2020-07-01 NOTE — Progress Notes (Signed)
GI Inpatient Follow-up Note  Subjective:  Patient seen and overall doing ok. Diagnosed with covid as well. Creatinine trended up.  Scheduled Inpatient Medications:  . apixaban  5 mg Oral BID  . Chlorhexidine Gluconate Cloth  6 each Topical Daily  . metoprolol tartrate  12.5 mg Oral BID  . pantoprazole  40 mg Oral Daily    Continuous Inpatient Infusions:   . albumin human 25 g (07/01/20 0823)    PRN Inpatient Medications:  acetaminophen **OR** acetaminophen, ondansetron **OR** ondansetron (ZOFRAN) IV  Review of Systems:  Review of Systems  Constitutional: Negative for chills and fever.  Respiratory: Negative for shortness of breath.   Cardiovascular: Negative for chest pain.  Gastrointestinal: Negative for abdominal pain, blood in stool, constipation, diarrhea, melena, nausea and vomiting.  Genitourinary: Negative for dysuria.  Musculoskeletal: Negative for joint pain.  Skin: Positive for rash.  Neurological: Negative for focal weakness.  Psychiatric/Behavioral: Negative for substance abuse.  All other systems reviewed and are negative.    Physical Examination: BP 110/63 (BP Location: Right Arm)   Pulse 75   Temp (!) 97.3 F (36.3 C)   Resp 20   SpO2 99%  Gen: NAD, alert and oriented x 4 HEENT: PEERLA, Neck: supple, no JVD or thyromegaly Chest: No respiratory distress CV: RRR Abd: soft, NT, ND Ext: edematous Skin: chronic venous changes Lymph: no LAD  Data: Lab Results  Component Value Date   WBC 6.6 06/30/2020   HGB 11.9 (L) 06/30/2020   HCT 35.9 (L) 06/30/2020   MCV 95.2 06/30/2020   PLT 81 (L) 06/30/2020   Recent Labs  Lab 06/29/20 1224 06/30/20 0520  HGB 16.0* 11.9*   Lab Results  Component Value Date   NA 133 (L) 07/01/2020   K 3.8 07/01/2020   CL 99 07/01/2020   CO2 23 07/01/2020   BUN 62 (H) 07/01/2020   CREATININE 1.99 (H) 07/01/2020   Lab Results  Component Value Date   ALT 26 06/30/2020   AST 36 06/30/2020   ALKPHOS 83  06/30/2020   BILITOT 1.5 (H) 06/30/2020   Recent Labs  Lab 06/29/20 2130  INR 1.1   Assessment/Plan: 85 y/o lady with NASH cirrhosis presenting with weakness and decreased urine output and AKI. Current MELD-Na is 17. AKI could be due to ATN or NSAID use. Less likely to be hepatorenal.  Recommendations:  - appreciate nephrology recs - hold all diuretics - continue with albumin 1 gram/kg 25% q6 hours (max of 100 grams daily), would discontinue after today - renal ultrasound - Daily CMP and INR - monitor daily I/O's - avoid hypotension so would D/C beta-blockers currently - avoid nephrotoxic agents - diagnostic paracentesis if able to get enough fluid - wound care - improve nutrition, needs increased protein  Will continue to follow. Please call with any questions or concerns.  Raylene Miyamoto MD, MPH [place]

## 2020-07-01 NOTE — TOC Initial Note (Addendum)
Transition of Care [facility]) - Initial/Assessment Note    Patient Details  Name: [name] MRN: [id number] Date of Birth: 1935/09/08  Transition of Care [facility]) CM/SW Contact:    Summer Ivan, LCSW Phone Number: 07/01/2020, 2:53 PM  Clinical Narrative:              Patient on Airborne Precautions. PT recommending SNF. Spoke to patient via phone who also asked CSW to call her son. Patient lives with her ex husband. Her children are involved and supportive. PCP is Summer Net, NP. Pharmacy is [facility]. Patient had [place] In the past, could not recall agency used. Patient has a RW at home. No SNF history. Patient typically drives herself to appointments. Patient and son both reported they prefer patient to return home with Home Health. Son reported patient needs a 3 in 1. Referral accepted by [facility]. Referral to [facility] for 3 in 1. Asked MD for orders.  4:10- Call from patient's daughter [name]. Provided update as above. She asked if a hospital bed can be ordered if patient qualifies, CSW asked [employer] Representative [name] to review and see if patient qualifies. Will continue to follow. [name] also asked for herself or patient's son [name] to be contacted by [facility] agency for scheduling, updated [name] with [facility].  Expected Discharge Plan: [place] Barriers to Discharge: Continued Medical Work up   Patient Goals and CMS Choice Patient states their goals for this hospitalization and ongoing recovery are:: home with home health CMS Medicare.gov Compare Post Acute Care list provided to:: Patient Choice offered to / list presented to : [place]  Expected Discharge Plan and Services Expected Discharge Plan: [place]       Living arrangements for the past 2 months: [place]                 DME Arranged: 3-N-1 DME Agency: [employer] Date DME Agency Contacted:  07/01/20   Representative spoke with at DME Agency: [name] Date [place]: 07/01/20   Representative spoke with at [place]: [name]  Prior Living Arrangements/Services Living arrangements for the past 2 months: [place] Lives with:: Spouse Patient language and need for interpreter reviewed:: Yes Do you feel safe going back to the place where you live?: Yes      Need for Family Participation in Patient Care: Yes (Comment) Care giver support system in place?: Yes (comment) Current home services: DME Criminal Activity/Legal Involvement Pertinent to Current Situation/Hospitalization: No - Comment as needed  Activities of Daily Living Home Assistive Devices/Equipment: Eyeglasses ADL Screening (condition at time of admission) Patient's cognitive ability adequate to safely complete daily activities?: Yes Is the patient deaf or have difficulty hearing?: No Does the patient have difficulty seeing, even when wearing glasses/contacts?: No Does the patient have difficulty concentrating, remembering, or making decisions?: No Patient able to express need for assistance with ADLs?: Yes Does the patient have difficulty dressing or bathing?: No Independently performs ADLs?: Yes (appropriate for developmental age) Does the patient have difficulty walking or climbing stairs?: Yes Weakness of Legs: Both Weakness of Arms/Hands: None  Permission Sought/Granted Permission sought to share information with : Facility Heritage manager Permission granted to share information with : Yes, Verbal Permission Granted     Permission granted to share info w AGENCY: DME, [place]  Permission granted  to share info w Relationship: son, daughter     Emotional Assessment       Orientation: : Oriented to Self,Oriented to Place,Oriented to  Time,Oriented to Situation Alcohol / Substance  Use: Not Applicable Psych Involvement: No (comment)  Admission diagnosis:  Weakness [R53.1] Bilateral leg edema [R60.0] AKI (acute kidney injury) ([place]) [N17.9] Abdominal pain [R10.9] Leg pain, anterior, left [M79.605] Pain in both lower extremities [G38.756, M79.605] Patient Active Problem List   Diagnosis Date Noted  . Bilateral leg edema   . Liver cirrhosis secondary to Summer ([place])   . AKI (acute kidney injury) ([place]) 06/29/2020  . Debility 06/29/2020  . Sepsis ([place]) 06/12/2020  . Acute pulmonary embolism ([place]) 06/02/2020  . Breast cancer ([place]) 11/02/2014  . Breast CA ([place]) 01/19/2013   PCP:  Sallee Lange, NP Pharmacy:   [facility], [place], [address] [place] [address] Phone: [phone] Fax: [place], [place] [address] Phone: [phone] Fax: [phone]  [id number] [place], [place] [address] [place] [address] Phone: [phone] Fax: [phone]     Social Determinants of Health (SDOH) Interventions    Readmission Risk Interventions No flowsheet data found.

## 2020-07-01 NOTE — ED Provider Notes (Deleted)
[facility] Department of Emergency Medicine   Code Blue CONSULT NOTE  Chief Complaint: Cardiac arrest/unresponsive   Level V Caveat: Unresponsive  History of present illness: I was contacted by the hospital for a CODE BLUE cardiac arrest upstairs and presented to the patient's bedside.   Apneic and pulseless on initial assessment. CPR in progress. Unknown last time pt had pulse as noted by RN to be pulseless on bedside assessment. No additional history available on my arrival.   ROS: Unable to obtain, Level V caveat  Scheduled Meds: . apixaban  5 mg Oral BID  . Chlorhexidine Gluconate Cloth  6 each Topical Daily  . feeding supplement  237 mL Oral TID BM  . metoprolol tartrate  12.5 mg Oral BID  . multivitamin with minerals  1 tablet Oral Daily  . pantoprazole  40 mg Oral Daily   Continuous Infusions: . albumin human 25 g (07/01/20 2032)   PRN Meds:.acetaminophen **OR** acetaminophen, ondansetron **OR** ondansetron (ZOFRAN) IV Past Medical History:  Diagnosis Date  . Arthritis    lower back, knees  . Breast cancer ([place]) 2011   RT LUMPECTOMY  . Chemotherapy follow-up examination 2011   RT BREAST CANCER  . Cirrhosis ([place])   . Personal history of chemotherapy   . Personal history of radiation therapy   . Radiation 2011   RT BREAST CANCER  . Skin cancer   . Wears dentures    full upper, partial lower   Past Surgical History:  Procedure Laterality Date  . BREAST BIOPSY Right 2014   RT CORE W/CLIP - NEG  . BREAST EXCISIONAL BIOPSY Right 2011   breast ca  . CATARACT EXTRACTION W/PHACO Right 04/15/2016   Procedure: CATARACT EXTRACTION PHACO AND INTRAOCULAR LENS PLACEMENT (IOC);  Surgeon: Leandrew Koyanagi, MD;  Location: [facility];  Service: Ophthalmology;  Laterality: Right;  . CATARACT EXTRACTION W/PHACO Left 07/01/2016   Procedure: CATARACT EXTRACTION PHACO AND INTRAOCULAR LENS PLACEMENT (IOC);  Surgeon: Leandrew Koyanagi, MD;   Location: [facility];  Service: Ophthalmology;  Laterality: Left;  Left eye  . CHOLECYSTECTOMY     Social History   Socioeconomic History  . Marital status: Divorced    Spouse name: Not on file  . Number of children: Not on file  . Years of education: Not on file  . Highest education level: Not on file  Occupational History  . Not on file  Tobacco Use  . Smoking status: Current Every Day Smoker    Packs/day: 0.50    Types: Cigarettes    Last attempt to quit: 04/28/2014    Years since quitting: 6.1  . Smokeless tobacco: Former Network engineer and Sexual Activity  . Alcohol use: No    Alcohol/week: 0.0 standard drinks    Comment: occasional glass of wine  . Drug use: No  . Sexual activity: Not on file  Other Topics Concern  . Not on file  Social History Narrative  . Not on file   Social Determinants of Health   Financial Resource Strain: Not on file  Food Insecurity: Not on file  Transportation Needs: Not on file  Physical Activity: Not on file  Stress: Not on file  Social Connections: Not on file  Intimate Partner Violence: Not on file   Allergies  Allergen Reactions  . Gabapentin Itching  . Ivp Dye [Iodinated Diagnostic Agents] Itching    Last set of Vital Signs (not current) Vitals:   07/01/20 2024 07/01/20 2300  BP: Marland Kitchen)  96/53 (!) 98/57  Pulse: 75 80  Resp:    Temp:  97.8 F (36.6 C)  SpO2:  96%      Physical Exam  Gen: unresponsive Cardiovascular: pulseless  Resp: apneic. Breath sounds equal bilaterally with bagging  Abd: nondistended  Neuro: GCS 3, unresponsive to pain  HEENT: No blood in posterior pharynx, gag reflex absent  Neck: No crepitus  Musculoskeletal: No deformity  Skin: warm  Procedures (when applicable, including Critical Care time): Procedure Name: Intubation Date/Time: 07/02/2020 1:46 AM Performed by: Lucrezia Starch, MD Pre-anesthesia Checklist: Emergency Drugs available, Patient identified and Patient being  monitored Oxygen Delivery Method: Ambu bag Preoxygenation: Pre-oxygenation with 100% oxygen Laryngoscope Size: Glidescope Number of attempts: 2 Airway Equipment and Method: Video-laryngoscopy Placement Confirmation: ETT inserted through vocal cords under direct vision,  CO2 detector and Breath sounds checked- equal and bilateral Tube secured with: ETT holder       MDM / Assessment and Plan ACLS performed per [facility] for approximately 25-38min. Glucose WNL. Pt intubated per above procedure note. Time of death called at 26 as I considered any attempts at further resuscitation futile. Hospitalist notified family.     Lucrezia Starch, MD 07/02/20 [phone]

## 2020-07-01 NOTE — Progress Notes (Addendum)
Initial Nutrition Assessment  DOCUMENTATION CODES:   Not applicable  INTERVENTION:   Ensure Enlive po TID, each supplement provides 350 kcal and 20 grams of protein  MVI po daily   Liberalize diet   Pt at high refeed risk; recommend monitor potassium, magnesium and phosphorus labs daily until stable  NUTRITION DIAGNOSIS:   Increased nutrient needs related to catabolic illness (COVID 19, cirrhosis, breast cancer) as evidenced by estimated needs.  GOAL:   Patient will meet greater than or equal to 90% of their needs  MONITOR:   PO intake,Supplement acceptance,Labs,Weight trends,Skin,I & O's  REASON FOR ASSESSMENT:   Consult Assessment of nutrition requirement/status  ASSESSMENT:   85 y.o. female with medical history of HTN, breast cancer s/p right lumpectomy currently in remission, atrial fibrillation, b/l PE dx in early Jan. on eliquis, GERD, NASH with cirrhosis, chronic intermittent headaches and lower extremity edema who presents via EMS from home for assessment of worsening bilateral lower extremity pain and weakness. Pt also found to have COVID 19.   RD working remotely.  Unable to reach pt by phone. Suspect pt with poor appetite and oral intake pta r/t COVID 19. Pt does not have any documented intakes from this admit. Spoke with RN who reports that pt is eating well. Of note, pt with upper and lower dentures. RD will add supplements and MVI to help pt meet her estimated needs. RD will also liberalize pt's diet. Pt is likely at refeed risk. RD will obtain nutrition related history and provide high protein diet education at follow-up.   There are no documented weights from this admit; pt's UBW appears to be ~166-168lbs.   Medications reviewed and include: protonix, albumin   Labs reviewed: Na 133(L), BUN 62(H), creat 1.99(H)  NUTRITION - FOCUSED PHYSICAL EXAM: Unable to perform at this time   Diet Order:   Diet Order            Diet Heart Room service  appropriate? Yes; Fluid consistency: Thin  Diet effective now                EDUCATION NEEDS:   Education needs have been addressed  Skin:  Skin Assessment: Reviewed RN Assessment (ecchymosis)  Last BM:  1/30- type 5  Height:   Ht Readings from Last 1 Encounters:  06/13/20 5\' 8"  (1.727 m)    Weight:   Wt Readings from Last 1 Encounters:  06/13/20 84.3 kg    Ideal Body Weight:  63.6 kg  BMI:  There is no height or weight on file to calculate BMI.  Estimated Nutritional Needs:   Kcal:  1700-1900kcal/day  Protein:  85-95g/day  Fluid:  1.6L/day  Koleen Distance MS, RD, LDN Please refer to [facility] for RD and/or RD on-call/weekend/after hours pager

## 2020-07-01 NOTE — Progress Notes (Signed)
Daughter, Otila Kluver, updated via phone. Concerns addressed. All questions answered. Facilitated the MD calling with update. Madlyn Frankel, RN

## 2020-07-01 NOTE — Plan of Care (Signed)
  Problem: Pain Managment: Goal: General experience of comfort will improve Outcome: Progressing   Problem: Safety: Goal: Ability to remain free from injury will improve Outcome: Progressing   

## 2020-07-01 NOTE — Progress Notes (Signed)
Pt refused 0400 VS at this time, RN notified.

## 2020-07-01 NOTE — Progress Notes (Signed)
[place] [facility] [place] NAME: [name]    MR#:  [id number]  DATE OF BIRTH:  1935/07/17  SUBJECTIVE:  patient came in with increasing shortness of breath and weakness. She told me she could not get out of bed for two days. This is her third admission in January 2022.  patient tells me she is feeling cold in the room. She is denying shortness of breath. Overall better than yesterday. REVIEW OF SYSTEMS:   Review of Systems  Constitutional: Positive for malaise/fatigue. Negative for chills, fever and weight loss.  HENT: Negative for ear discharge, ear pain and nosebleeds.   Eyes: Negative for blurred vision, pain and discharge.  Respiratory: Negative for sputum production, wheezing and stridor.   Cardiovascular: Positive for leg swelling. Negative for chest pain, palpitations, orthopnea and PND.  Gastrointestinal: Negative for abdominal pain, diarrhea, nausea and vomiting.  Genitourinary: Negative for frequency and urgency.  Musculoskeletal: Negative for back pain and joint pain.  Neurological: Positive for weakness. Negative for sensory change, speech change and focal weakness.  Psychiatric/Behavioral: Negative for depression and hallucinations. The patient is not nervous/anxious.    Tolerating Diet:yes Tolerating PT: SNF--but pt wants to go home   DRUG ALLERGIES:   Allergies  Allergen Reactions  . Gabapentin Itching  . Ivp Dye [Iodinated Diagnostic Agents] Itching    VITALS:  Blood pressure 110/63, pulse 75, temperature (!) 97.3 F (36.3 C), resp. rate 20, SpO2 99 %.  PHYSICAL EXAMINATION:   Physical Exam  GENERAL:  85 y.o.-year-old patient lying in the bed with no acute distress. chronically ill and debilitated. LUNGS: Normal breath sounds bilaterally, no wheezing, rales, rhonchi. No use of accessory muscles of respiration.  CARDIOVASCULAR: S1, S2 normal. No murmurs, rubs, or gallops.  ABDOMEN: Soft, nontender, nondistended.  Cannot appreciate fluid thrill. EXTREMITIES:     NEUROLOGIC: No focal Motor or sensory deficits b/l.   PSYCHIATRIC:  patient is alert and oriented x 3.  SKIN as above  LABORATORY PANEL:  CBC Recent Labs  Lab 06/30/20 0520  WBC 6.6  HGB 11.9*  HCT 35.9*  PLT 81*    Chemistries  Recent Labs  Lab 06/29/20 1224 06/30/20 0415 07/01/20 1048  NA 131* 135 133*  K 4.4 3.8 3.8  CL 98 99 99  CO2 20* 25 23  GLUCOSE 113* 83 121*  BUN 48* 54* 62*  CREATININE 1.52* 1.67* 1.99*  CALCIUM 7.7* 7.6* 7.8*  MG 2.1  --   --   AST 72* 36  --   ALT 44 26  --   ALKPHOS 157* 83  --   BILITOT 1.4* 1.5*  --    Cardiac Enzymes No results for input(s): TROPONINI in the last 168 hours. RADIOLOGY:  CT HEAD WO CONTRAST  Result Date: 06/29/2020 CLINICAL DATA:  BILATERAL worsening lower extremity pain and weakness, history hypertension, breast cancer, atrial fibrillation, question stroke, history cirrhosis EXAM: CT HEAD WITHOUT CONTRAST TECHNIQUE: Contiguous axial images were obtained from the base of the skull through the vertex without intravenous contrast. Sagittal and coronal MPR images reconstructed from axial data set. COMPARISON:  06/01/2020 FINDINGS: Brain: Generalized atrophy. Normal ventricular morphology. No midline shift or mass effect. Small vessel chronic ischemic changes of deep cerebral white matter. No intracranial hemorrhage, mass lesion, evidence of acute infarction, or extra-axial fluid collection. Vascular: Atherosclerotic calcification of internal carotid arteries at skull base. No hyperdense vessels Skull: Intact Sinuses/Orbits: Clear Other: N/A IMPRESSION: Atrophy with small vessel chronic  ischemic changes of deep cerebral white matter. No acute intracranial abnormalities. Electronically Signed   By: Lavonia Dana M.D.   On: 06/29/2020 16:42   US Abdomen Complete  Result Date: 06/29/2020 CLINICAL DATA:  Abdominal pain. EXAM: ABDOMEN ULTRASOUND COMPLETE COMPARISON:  CT dated June 12, 2020 FINDINGS: Gallbladder: The patient is status post prior cholecystectomy. Common bile duct: Diameter: 3 mm Liver: The liver surface is nodular. There is no discrete hepatic mass. Portal vein is patent on color Doppler imaging with normal direction of blood flow towards the liver. IVC: No abnormality visualized. Pancreas: Visualized portion unremarkable. Spleen: Size and appearance within normal limits. Right Kidney: Length: 10 cm. There is no hydronephrosis. There is a lower pole 1.7 cm cyst. Left Kidney: Length: 10.3 cm. Echogenicity within normal limits. No mass or hydronephrosis visualized. Abdominal aorta: No aneurysm visualized. Other findings: There is a small volume of ascites in the upper abdomen. There are bilateral pleural effusions. IMPRESSION: 1. Cirrhotic appearing liver without evidence for discrete hepatic mass. 2. Small volume ascites in the upper abdomen. 3. Incidentally noted are bilateral pleural effusions. Electronically Signed   By: Constance Holster M.D.   On: 06/29/2020 18:49   US Venous Img Lower Bilateral (DVT)  Result Date: 06/29/2020 CLINICAL DATA:  Edema EXAM: BILATERAL LOWER EXTREMITY VENOUS DOPPLER ULTRASOUND TECHNIQUE: Gray-scale sonography with compression, as well as color and duplex ultrasound, were performed to evaluate the deep venous system(s) from the level of the common femoral vein through the popliteal and proximal calf veins. COMPARISON:  June 01, 2020 FINDINGS: VENOUS Normal compressibility of the common femoral, superficial femoral, and popliteal veins, as well as the visualized calf veins. Visualized portions of profunda femoral vein and great saphenous vein unremarkable. No filling defects to suggest DVT on grayscale or color Doppler imaging. Doppler waveforms show normal direction of venous flow, normal respiratory plasticity and response to augmentation. OTHER There is nonspecific bilateral lower extremity edema. Limitations: none IMPRESSION: 1. No DVT.  2. There is nonspecific bilateral lower extremity edema. Electronically Signed   By: Constance Holster M.D.   On: 06/29/2020 18:45   ASSESSMENT AND PLAN:   [name] is a 85 y.o. 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 Fib and PE on NOAC, and history of breast cancer who presents with weakness/fatigue along with lower extremity edema and AKI. Baseline appears normal. She endorses shortness of breath with ambulation.  She states that she has had decreased urination in the last week.   She endorses compliance of medications.  However she did state that she has stopped taking her Lasix because she "I felt bad"  This is patients 3rd admission in Jan 2022  Acute kidney injury in setting of liver cirrhosis and poor compliance ?Hepatorenal syndrome -Worsening liver enzymes and hyponatremia in setting of noncompliance with medical med recommendations -Patient has been advised to follow-up outpatient with gastroenterologist for the last 12 years but she does not believe that she has liver cirrhosis -Checking PT and INR to calculate meld score -GI const with Dr. Andrey Farmer --recommends holding patient lasix and give albumin 25% 1 g/kg q6hrs (max of 100 grams in 24 hours) -Lasix has been discontinued -Resumed home spironolactone 25 mg daily --Nephrology consult with Dr Gerald Dexter IVF--BP much better  Hypertension-controlled -Resumed home metoprolol 12.5 mg p.o. twice daily  Gen weakness/Debility -PT/OT to see pt-- recommends rehab however patient wants to go home. -CT the head without contrast --no acute abnormality -Bilateral lower extremity  ultrasound NEGATIVE for DVT  NASH/Cirrhosis of liver with gastric varices (per record) ChronicTCP Ascites with recent paracentesis 06/13/2020 --follow GI rec-- continue IV albumin for one more day.  Bilateral subsegmental pulmonary embolism-resumed home apixaban 5 mg p.o. twice daily (dx Jan 2022)  Paroxysmal  atrial fibrillation-apixaban 5 mg p.o. twice daily  Chronic Bilateral lower extremity venous changes-with redness however no evidence of cellulitis, this appears to be chronic in nature --pt used to follow wound clinic in 2019  Multiple medical problems with overall decline --- consult palliative care to help discuss goals of care.  D/c foley today  DVT prophylaxis: Apixaban Code Status: full code d/w pt in the ER Diet: Heart healthy Family Communication: son [name]  Disposition Plan: Pending clinical course Consults called: Gastroenterology, Nephrology  Level of care: Med-Surg Status is: Inpatient  Remains inpatient appropriate because:Inpatient level of care appropriate due to severity of illness   Dispo: The patient is from: Home              Anticipated d/c is to: home with [facility] per pt and son request              Anticipated d/c date is 1-3 days              Patient currently is not medically stable to d/c.   Difficult to place patient No        TOTAL TIME TAKING CARE OF THIS PATIENT: 25 minutes.  >50% time spent on counselling and coordination of care  Note: This dictation was prepared with Dragon dictation along with smaller phrase technology. Any transcriptional errors that result from this process are unintentional.  Fritzi Mandes M.D    [facility]   CC: Primary care physician; Sallee Lange, NPPatient ID: [name], female   DOB: May 11, 1936, 85 y.o.   MRN: [id number]

## 2020-07-02 DIAGNOSIS — U071 COVID-19: Secondary | ICD-10-CM

## 2020-07-02 DIAGNOSIS — Z7189 Other specified counseling: Secondary | ICD-10-CM

## 2020-07-02 LAB — URINE CULTURE: Culture: NO GROWTH

## 2020-07-02 LAB — BASIC METABOLIC PANEL
Anion gap: 12 (ref 5–15)
BUN: 62 mg/dL — ABNORMAL HIGH (ref 8–23)
CO2: 22 mmol/L (ref 22–32)
Calcium: 8.2 mg/dL — ABNORMAL LOW (ref 8.9–10.3)
Chloride: 101 mmol/L (ref 98–111)
Creatinine, Ser: 1.69 mg/dL — ABNORMAL HIGH (ref 0.44–1.00)
GFR, Estimated: 30 mL/min — ABNORMAL LOW (ref >60)
Glucose, Bld: 79 mg/dL (ref 70–99)
Potassium: 4.1 mmol/L (ref 3.5–5.1)
Sodium: 135 mmol/L (ref 135–145)

## 2020-07-02 MED ORDER — FUROSEMIDE 40 MG PO TABS
40.0000 mg | ORAL_TABLET | Freq: Every day | ORAL | Status: DC
  Administered 2020-07-02 – 2020-07-03 (×2): 40 mg via ORAL
  Filled 2020-07-02 (×2): qty 1

## 2020-07-02 MED ORDER — SPIRONOLACTONE 25 MG PO TABS
25.0000 mg | ORAL_TABLET | Freq: Every day | ORAL | Status: DC
  Administered 2020-07-02 – 2020-07-03 (×2): 25 mg via ORAL
  Filled 2020-07-02 (×2): qty 1

## 2020-07-02 MED ORDER — CALCIUM CARBONATE 1250 (500 CA) MG PO TABS
1250.0000 mg | ORAL_TABLET | Freq: Every day | ORAL | Status: DC
  Administered 2020-07-02 – 2020-07-03 (×2): 1250 mg via ORAL
  Filled 2020-07-02 (×2): qty 1

## 2020-07-02 MED ORDER — VITAMIN D3 25 MCG (1000 UNIT) PO TABS
1000.0000 [IU] | ORAL_TABLET | Freq: Every day | ORAL | Status: DC
  Administered 2020-07-02 – 2020-07-03 (×2): 1000 [IU] via ORAL
  Filled 2020-07-02 (×4): qty 1

## 2020-07-02 MED ORDER — POTASSIUM CHLORIDE CRYS ER 10 MEQ PO TBCR
10.0000 meq | EXTENDED_RELEASE_TABLET | Freq: Every day | ORAL | Status: DC
  Administered 2020-07-02 – 2020-07-03 (×2): 10 meq via ORAL
  Filled 2020-07-02 (×2): qty 1

## 2020-07-02 NOTE — Consult Note (Addendum)
[facility] Nurse Consult Note: Reason for Consult: Consult requested for bilat legs.  Pt is in isolation for Covid and has generalized edema and erythremia to right upper thigh and bilat calves.  There are no open wounds, but mod amt yellow drainage is weeping from some locations.  Dressing procedure/placement/frequency: Topical treatment orders provided for bedside nurses to perform as follows to decrease edema and absorb drainage:  Bedside nurse: Reapply leg wraps Q am as follows to provide light compression: wrap kerlex in a spiral fashion, beginning just behind toes to below knees, then repeat the same with ace wraps.  If there is a large amt drainage, then add ABD pads under the kerlex. Foam dressing to right upper thigh.  Change Q 3 days or PRN when soiled Please re-consult if further assistance is needed.  Thank-you,  Julien Girt MSN, [place], [place], [place], [place]

## 2020-07-02 NOTE — Progress Notes (Signed)
Physical Therapy Treatment Patient Details Name: [name] MRN: [id number] DOB: 1935-09-21 Today's Date: 07/02/2020    History of Present Illness 85 y.o. female presenting to hospital 1/29 with worsening B LE pain and weakness.  Pt admitted with acute kidney injury in setting of liver cirrhosis and poor compliance, htn, and acute onset of weakness; Covid (+).  PMH includes htn, breast CA s/p R lumpectomy (currently in remission), a-fib, B PE (early Jan on eliquis), GERD, chronic intermittent HA's and nausea and LE edema.    PT Comments    Pt doing well, able to ambulate ~100 ft into the hallway with stable vitals (on room air) and no LOBs, excessive fatigue or other overt red flags.  She was able to do most mobility without direct assist and though she is not at her baseline she is doing well enough, safely enough to be able to return home once medically ready for d/c.     Follow Up Recommendations  Home health PT     Equipment Recommendations  Rolling walker with 5" wheels;3in1 (PT)    Recommendations for Other Services       Precautions / Restrictions Precautions Precautions: Fall Restrictions Weight Bearing Restrictions: No    Mobility  Bed Mobility Overal bed mobility: Needs Assistance Bed Mobility: Sit to Supine Rolling: Modified independent (Device/Increase time)   Supine to sit: Min assist Sit to supine: Modified independent (Device/Increase time);HOB elevated   General bed mobility comments: Pt initially wanting more help than she actually needed, with PT encouragement she was able to rise with only light HHA and cuing  Transfers Overall transfer level: Needs assistance Equipment used: Rolling walker (2 wheeled) Transfers: Sit to/from Stand Sit to Stand: Supervision         General transfer comment: Pt able to rise with only minimal cuing, good confidence and sequencing w/o any phyiscal assist  Ambulation/Gait Ambulation/Gait assistance:  Supervision Gait Distance (Feet): 100 Feet Assistive device: Rolling walker (2 wheeled)       General Gait Details: Pt moved relatively well with walker, her O2 remained in the mid 90s (on room air t/o) and HR similarly was stable in the 80-90 range.  Pt with deliberate but safe ambulation with moderate walker reliance and no LOBs/safety concerns.   Stairs             Wheelchair Mobility    Modified Rankin (Stroke Patients Only)       Balance Overall balance assessment: Needs assistance Sitting-balance support: No upper extremity supported;Feet unsupported Sitting balance-Leahy Scale: Good Sitting balance - Comments: Sitting EOB   Standing balance support: No upper extremity supported;During functional activity Standing balance-Leahy Scale: Good Standing balance comment: Able to perform standing grooming tasks with b/l UE unsupported                            Cognition Arousal/Alertness: Awake/alert Behavior During Therapy: WFL for tasks assessed/performed Overall Cognitive Status: Within Functional Limits for tasks assessed                                 General Comments: A&Ox4. Pleasant and agreeable to session      Exercises General Exercises - Lower Extremity Ankle Circles/Pumps: AROM;10 reps Short Arc Quad: Strengthening;10 reps Heel Slides: Strengthening;10 reps Hip ABduction/ADduction: Strengthening;10 reps    General Comments General comments (skin integrity, edema, etc.): Pt b/l LE donned  with wound bandages      Pertinent Vitals/Pain Pain Assessment: No/denies pain Faces Pain Scale: Hurts a little bit Pain Location: minimal B LE's/feet    Home Living                      Prior Function            PT Goals (current goals can now be found in the care plan section) Acute Rehab PT Goals Patient Stated Goal: to improve pain and walk Progress towards PT goals: Progressing toward goals    Frequency     Min 2X/week      PT Plan Discharge plan needs to be updated    Co-evaluation              AM-PAC PT "6 Clicks" Mobility   Outcome Measure  Help needed turning from your back to your side while in a flat bed without using bedrails?: None Help needed moving from lying on your back to sitting on the side of a flat bed without using bedrails?: A Little Help needed moving to and from a bed to a chair (including a wheelchair)?: None Help needed standing up from a chair using your arms (e.g., wheelchair or bedside chair)?: None Help needed to walk in hospital room?: None Help needed climbing 3-5 steps with a railing? : A Little 6 Click Score: 22    End of Session Equipment Utilized During Treatment: Gait belt Activity Tolerance: Patient limited by pain Patient left: in bed;with call bell/phone within reach;Other (comment) Nurse Communication: Mobility status PT Visit Diagnosis: Muscle weakness (generalized) (M62.81);History of falling (Z91.81);Difficulty in walking, not elsewhere classified (R26.2);Other abnormalities of gait and mobility (R26.89);Pain Pain - Right/Left: Right Pain - part of body: Ankle and joints of foot     Time: 3790-2409 PT Time Calculation (min) (ACUTE ONLY): 49 min  Charges:  $Gait Training: 8-22 mins $Therapeutic Exercise: 8-22 mins $Therapeutic Activity: 8-22 mins                     Kreg Shropshire, DPT 07/02/2020, 1:32 PM

## 2020-07-02 NOTE — Progress Notes (Signed)
[facility], [place] 07/02/20  Subjective:   Hospital day # 2  Feels okay Still has large amount of edema in her legs States she is able to eat some although appetite is poor Was able to have a bowel movement yesterday Since the catheter came out she was able to void 4-5 times today   Renal: 01/31 0701 - 02/01 0700 In: -  Out: 550 [Urine:550] Lab Results  Component Value Date   CREATININE 1.69 (H) 07/02/2020   CREATININE 1.99 (H) 07/01/2020   CREATININE 1.67 (H) 06/30/2020     Objective:  Vital signs in last 24 hours:  Temp:  [97.3 F (36.3 C)-97.9 F (36.6 C)] 97.5 F (36.4 C) (02/01 0802) Pulse Rate:  [75-92] 92 (02/01 0802) Resp:  [16-20] 16 (02/01 0802) BP: (83-140)/(38-81) 136/81 (02/01 0802) SpO2:  [96 %-98 %] 96 % (02/01 0802)  Weight change:  There were no vitals filed for this visit.  Intake/Output:    Intake/Output Summary (Last 24 hours) at 07/02/2020 1449 Last data filed at 07/01/2020 1800 Gross per 24 hour  Intake --  Output 300 ml  Net -300 ml     Physical Exam: General:  Elderly, frail female, laying in the bed  HEENT  moist oral mucous membranes, anicteric  Pulm/lungs  normal breathing effort on room air, clear anteriorly and laterally  CVS/Heart  no rub  Abdomen:   Soft, nontender, nondistended  Extremities:  2+ pitting edema bilaterally  Neurologic:  Alert, able to answer questions appropriately  Skin:  Scattered ecchymosis    Basic Metabolic Panel:  Recent Labs  Lab 06/29/20 1224 06/30/20 0415 07/01/20 1048 07/02/20 0740  NA 131* 135 133* 135  K 4.4 3.8 3.8 4.1  CL 98 99 99 101  CO2 20* 25 23 22   GLUCOSE 113* 83 121* 79  BUN 48* 54* 62* 62*  CREATININE 1.52* 1.67* 1.99* 1.69*  CALCIUM 7.7* 7.6* 7.8* 8.2*  MG 2.1  --   --   --      CBC: Recent Labs  Lab 06/29/20 1224 06/30/20 0520  WBC 9.6 6.6  NEUTROABS 7.2  --   HGB 16.0* 11.9*  HCT 48.1* 35.9*  MCV 93.8 95.2  PLT 136* 81*      No results found for: HEPBSAG, HEPBSAB, HEPBIGM    Microbiology:  Recent Results (from the past 240 hour(s))  SARS CORONAVIRUS 2 (TAT 6-24 HRS) Nasopharyngeal Nasopharyngeal Swab     Status: Abnormal   Collection Time: 06/29/20 12:24 PM   Specimen: Nasopharyngeal Swab  Result Value Ref Range Status   SARS Coronavirus 2 POSITIVE (A) NEGATIVE Final    Comment: (NOTE) SARS-CoV-2 target nucleic acids are DETECTED.  The SARS-CoV-2 RNA is generally detectable in upper and lower respiratory specimens during the acute phase of infection. Positive results are indicative of the presence of SARS-CoV-2 RNA. Clinical correlation with patient history and other diagnostic information is  necessary to determine patient infection status. Positive results do not rule out bacterial infection or co-infection with other viruses.  The expected result is Negative.  Fact Sheet for Patients: SugarRoll.be  Fact Sheet for Healthcare Providers: https://www.woods-mathews.com/  This test is not yet approved or cleared by the [place] FDA and  has been authorized for detection and/or diagnosis of SARS-CoV-2 by FDA under an Emergency Use Authorization (EUA). This EUA will remain  in effect (meaning this test can be used) for the duration of the COVID-19 declaration under Section 564(b)(1) of the Act,  21 U. S.C. section 360bbb-3(b)(1), unless the authorization is terminated or revoked sooner.   Performed at [facility], [place] [address] [place], [place] [address]   Urine Culture     Status: None   Collection Time: 06/30/20  4:54 PM   Specimen: Urine, Clean Catch  Result Value Ref Range Status   Specimen Description   Final    URINE, CLEAN CATCH Performed at [facility], [address] [place], [place] [address]    Special Requests   Final    NONE Performed at [facility], [address], [place], [place] [address]     Culture   Final    NO GROWTH Performed at [facility], [place] [address] [place], [place] [address]    Report Status 07/02/2020 FINAL  Final    Coagulation Studies: Recent Labs    06/29/20 09/16/2128  LABPROT 13.7  INR 1.1    Urinalysis: Recent Labs    06/29/20 1456  COLORURINE AMBER*  LABSPEC 1.017  PHURINE 5.0  GLUCOSEU NEGATIVE  HGBUR LARGE*  BILIRUBINUR NEGATIVE  KETONESUR NEGATIVE  PROTEINUR 100*  NITRITE NEGATIVE  LEUKOCYTESUR NEGATIVE      Imaging: No results found.   Medications:    . apixaban  5 mg Oral BID  . calcium carbonate  1,250 mg Oral Daily  . Chlorhexidine Gluconate Cloth  6 each Topical Daily  . cholecalciferol  1,000 Units Oral Daily  . feeding supplement  237 mL Oral TID BM  . furosemide  40 mg Oral Daily  . metoprolol tartrate  12.5 mg Oral BID  . multivitamin with minerals  1 tablet Oral Daily  . pantoprazole  40 mg Oral Daily  . potassium chloride  10 mEq Oral Daily  . spironolactone  25 mg Oral Daily   acetaminophen **OR** acetaminophen, ondansetron **OR** ondansetron (ZOFRAN) IV  Assessment/ Plan:  85 y.o. female with paroxysmal atrial fibrillation, nonalcoholic hepatic steatosis progressed to liver cirrhosis, debility, history of small segmental pulmonary embolism in the right and left lower lobes requiring anticoagulation with Eliquis   admitted on 06/29/2020 for Weakness [R53.1] Bilateral leg edema [R60.0] AKI (acute kidney injury) ([place]) [N17.9] Abdominal pain [R10.9] Leg pain, anterior, left [M79.605] Pain in both lower extremities [M79.604, M79.605]  #Acute kidney injury AKI likely secondary to ATN History of nonsteroidal use Few episodes of hypotension are noted 01/31 0701 - 02/01 0700 In: -  Out: 550 [Urine:550]  Lab Results  Component Value Date   CREATININE 1.69 (H) 07/02/2020   CREATININE 1.99 (H) 07/01/2020   CREATININE 1.67 (H) 06/30/2020  Imaging: Ultrasound abdomen June 29, 2020: Cirrhosis, small volume  ascites,  right lower pole 1.7 cm cyst, no mass or hydronephrosis  Baseline creatinine of 0.99 from June 14, 2020 Current creatinine trend is improving Patient is nonoliguric Electrolytes and volume status are acceptable.  No acute indication for dialysis  #Peripheral edema Secondary from third spacing in context of liver cirrhosis Continued on furosemide 40 mg daily and spironolactone 25 mg daily Avoid hypotension Consider compression socks during the day  #COVID-19 infection  tested + January 29 Currently on room air   LOS: [place] 2/1/20222:49 PM  [place], [place]  Note: This note was prepared with Dragon dictation. Any transcription errors are unintentional

## 2020-07-02 NOTE — TOC Progression Note (Signed)
Transition of Care [facility]) - Progression Note    Patient Details  Name: [name] MRN: [id number] Date of Birth: 10/02/1935  Transition of Care [facility]) CM/SW [place], LCSW Phone Number: 07/02/2020, 11:22 AM  Clinical Narrative:  Per MD, patient and son now agreeable to SNF placement. Referral sent to SNF's accepting COVID positive patients.  Expected Discharge Plan: [place] Barriers to Discharge: Continued Medical Work up  Expected Discharge Plan and Services Expected Discharge Plan: [place] arrangements for the past 2 months: [place]                 DME Arranged: 3-N-1 DME Agency: [employer] Date DME Agency Contacted: 07/01/20   Representative spoke with at DME Agency: [facility] Arranged: PT,OT,Nurse's Aide [place]: [place] Date [place]: 07/01/20   Representative spoke with at [place]: [place] ([place]) Interventions    Readmission Risk Interventions No flowsheet data found.

## 2020-07-02 NOTE — Progress Notes (Addendum)
[name] at [place] NAME: [name]    MR#:  [id number]  DATE OF BIRTH:  09/14/1935  SUBJECTIVE:  patient came in with increasing shortness of breath and weakness. She told me she could not get out of bed for two days. This is her third admission in January 2022.  patient tells me she is very weak and cannot even lift her legs. There is some weeping from both lower extremity. No fever.  REVIEW OF SYSTEMS:   Review of Systems  Constitutional: Positive for malaise/fatigue. Negative for chills, fever and weight loss.  HENT: Negative for ear discharge, ear pain and nosebleeds.   Eyes: Negative for blurred vision, pain and discharge.  Respiratory: Negative for sputum production, wheezing and stridor.   Cardiovascular: Positive for leg swelling. Negative for chest pain, palpitations, orthopnea and PND.  Gastrointestinal: Negative for abdominal pain, diarrhea, nausea and vomiting.  Genitourinary: Negative for frequency and urgency.  Musculoskeletal: Negative for back pain and joint pain.  Neurological: Positive for weakness. Negative for sensory change, speech change and focal weakness.  Psychiatric/Behavioral: Negative for depression and hallucinations. The patient is not nervous/anxious.    Tolerating Diet:yes Tolerating PT: SNF  DRUG ALLERGIES:   Allergies  Allergen Reactions  . Gabapentin Itching  . Ivp Dye [Iodinated Diagnostic Agents] Itching    VITALS:  Blood pressure 136/81, pulse 92, temperature (!) 97.5 F (36.4 C), temperature source Oral, resp. rate 16, SpO2 96 %.  PHYSICAL EXAMINATION:   Physical Exam  GENERAL:  85 y.o.-year-old patient lying in the bed with no acute distress. chronically ill and debilitated. LUNGS: Normal breath sounds bilaterally, no wheezing, rales, rhonchi. No use of accessory muscles of respiration.  CARDIOVASCULAR: S1, S2 normal. No murmurs, rubs, or gallops.  ABDOMEN: Soft, nontender,  nondistended. Cannot appreciate fluid thrill. EXTREMITIES:     NEUROLOGIC: No focal Motor or sensory deficits b/l.   PSYCHIATRIC:  patient is alert and oriented x 3.  SKIN as above  LABORATORY PANEL:  CBC Recent Labs  Lab 06/30/20 0520  WBC 6.6  HGB 11.9*  HCT 35.9*  PLT 81*    Chemistries  Recent Labs  Lab 06/29/20 1224 06/30/20 0415 07/01/20 1048 07/02/20 0740  NA 131* 135   < > 135  K 4.4 3.8   < > 4.1  CL 98 99   < > 101  CO2 20* 25   < > 22  GLUCOSE 113* 83   < > 79  BUN 48* 54*   < > 62*  CREATININE 1.52* 1.67*   < > 1.69*  CALCIUM 7.7* 7.6*   < > 8.2*  MG 2.1  --   --   --   AST 72* 36  --   --   ALT 44 26  --   --   ALKPHOS 157* 83  --   --   BILITOT 1.4* 1.5*  --   --    < > = values in this interval not displayed.   Cardiac Enzymes No results for input(s): TROPONINI in the last 168 hours. RADIOLOGY:  No results found. ASSESSMENT AND PLAN:   [name] is a 85 y.o. 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 Fib and PE on NOAC, and history of breast cancer who presents with weakness/fatigue along with lower extremity edema and AKI. Baseline appears normal. She endorses shortness of breath with ambulation.  She states that she has had decreased  urination in the last week.   She endorses compliance of medications.  However she did state that she has stopped taking her Lasix because she "I felt bad" This is patients 3rd admission in Jan 2022  Acute kidney injury in setting of liver cirrhosis and poor compliance ?Hepatorenal syndrome -Worsening liver enzymes and hyponatremia in setting of noncompliance with medical med recommendations -Patient has been advised to follow-up outpatient with gastroenterologist for the last 12 years but she does not believe that she has liver cirrhosis -Checking PT and INR to calculate meld score -GI const with Dr. Andrey Hopkins --recommends holding patient lasix and give albumin 25% q6hrs  -Lasix  has been discontinued -Resumed home spironolactone 25 mg daily --Nephrology consult  placed-recieved IVF--BP much better --2/1-- d/w nephrology. Will d/c IV albumin and resume po lasix 40 mg  Hypertension-controlled -Resumed home metoprolol 12.5 mg p.o. 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 twice daily (dx Jan 2022)  Paroxysmal atrial fibrillation-apixaban 5 mg p.o. twice daily  Chronic Bilateral lower extremity venous changes-with redness however no evidence of cellulitis, this appears to be chronic in nature --pt used to follow wound clinic in 2019 --wound consult appreciated--will wrap her legs as directed  Multiple medical problems with overall decline --- consult palliative care to help discuss goals of care.    DVT prophylaxis: Apixaban Code Status: full code d/w pt in the ER Diet: Heart healthy Family Communication: son [name]  Disposition Plan: Pending clinical course Consults called: Gastroenterology, Nephrology  Level of care: Med-Surg Status is: Inpatient  Remains inpatient appropriate because:Inpatient level of care appropriate due to severity of illness   Dispo: The patient is from: Home              Anticipated d/c is to:SNF              Anticipated d/c date isonce rehab bed available              Patient currently is not medically stable to d/c.   Difficult to place patient No  Addendum @ 1;45 pm-- patient did very well with physical therapy and occupational therapy. She walked hundred feet with vitals remaining stable. Recommendations now  are for home health physical therapy . I called back and spoke with son [name] and discussed change in discharge plan. He is in agreement with home health being set up. If patient continues to improve discharge home tomorrow with home health. TOC aware       TOTAL TIME TAKING CARE OF THIS PATIENT: 25 minutes.  >50% time spent on counselling and coordination of care  Note: This dictation was prepared with Dragon dictation along with smaller phrase technology. Any transcriptional errors that result from this process are unintentional.  Fritzi Mandes M.D    [facility]   CC: Primary care physician; Sallee Lange, NPPatient ID: [name], female   DOB: [birth year], 85 y.o.   MRN: [id number]

## 2020-07-02 NOTE — Progress Notes (Addendum)
Occupational Therapy Treatment Patient Details Name: [name] MRN: [id number] DOB: 03/27/1936 Today's Date: 07/02/2020    History of present illness Pt is an 84 y.o. female presenting to hospital 1/29 with worsening B LE pain and weakness.  Pt admitted with acute kidney injury in setting of liver cirrhosis and poor compliance, htn, and acute onset of weakness.  PMH includes htn, breast CA s/p R lumpectomy (currently in remission), a-fib, B PE (early Jan on eliquis), GERD, chronic intermittent HA's and nausea and LE edema.   OT comments  Pt seen for OT treatment on this date. Upon arrival to room pt awake and seated in recliner. Pt A&Ox4, reporting no pain, and agreeable to tx. This session, pt required only SUPERVISION/SET-UP to perform functional mobility (~20 feet) with RW and standing grooming tasks at sink-side (HR 70-100 t/o session). Pt is making good progress towards goals, with one goal met this session and subsequently updated. Given pt's progress and decreased assistance required for functional mobility and ADLs, discharge recommendation updated to home with home OT and supervision with OOB mobility. Pt continues to benefit from skilled OT services to maximize return to PLOF and minimize risk of future falls, injury, caregiver burden, and readmission.    Follow Up Recommendations  Home health OT;Supervision - Intermittent    Equipment Recommendations  3 in 1 bedside commode;Tub/shower bench;Other (comment) (RW)       Precautions / Restrictions Precautions Precautions: Fall Restrictions Weight Bearing Restrictions: No       Mobility Bed Mobility Overal bed mobility: Needs Assistance Bed Mobility: Sit to Supine       Sit to supine: Modified independent (Device/Increase time);HOB elevated   General bed mobility comments: increased time d/t L LE pain  Transfers Overall transfer level: Needs assistance Equipment used: Rolling walker (2 wheeled) Transfers: Sit to/from  Stand Sit to Stand: Supervision         General transfer comment: x2 sit>stand transfers, required increased time only to obtain upright posture with RW    Balance Overall balance assessment: Needs assistance Sitting-balance support: No upper extremity supported;Feet unsupported Sitting balance-Leahy Scale: Good Sitting balance - Comments: Sitting EOB   Standing balance support: No upper extremity supported;During functional activity Standing balance-Leahy Scale: Good Standing balance comment: Able to perform standing grooming tasks with b/l UE unsupported                           ADL either performed or assessed with clinical judgement   ADL Overall ADL's : Needs assistance/impaired     Grooming: Wash/dry hands;Wash/dry face;Oral care;Supervision/safety;Brushing hair;Set up;Standing                               Functional mobility during ADLs: Supervision/safety;Rolling walker General ADL Comments: Pt able to walk ~20 feet with RW and perform standing grooming tasks sink-side with SUPERVISION/SET-UP               Cognition Arousal/Alertness: Awake/alert Behavior During Therapy: WFL for tasks assessed/performed Overall Cognitive Status: Within Functional Limits for tasks assessed                                 General Comments: A&Ox4. Pleasant and agreeable to session              General Comments Pt b/l LE donned with wound   bandages    Pertinent Vitals/ Pain       Pain Assessment: No/denies pain         Frequency  Min 2X/week        Progress Toward Goals  OT Goals(current goals can now be found in the care plan section)  Progress towards OT goals: Progressing toward goals  Acute Rehab OT Goals Patient Stated Goal: to improve pain and walk OT Goal Formulation: With patient Time For Goal Achievement: 07/14/20 Potential to Achieve Goals: Good ADL Goals Pt Will Perform Grooming: with modified  independence;standing  Plan Discharge plan needs to be updated;Frequency remains appropriate       AM-PAC OT "6 Clicks" Daily Activity     Outcome Measure   Help from another person eating meals?: None Help from another person taking care of personal grooming?: A Little Help from another person toileting, which includes using toliet, bedpan, or urinal?: A Little Help from another person bathing (including washing, rinsing, drying)?: A Lot Help from another person to put on and taking off regular upper body clothing?: None Help from another person to put on and taking off regular lower body clothing?: A Little 6 Click Score: 19    End of Session Equipment Utilized During Treatment: Gait belt;Rolling walker  OT Visit Diagnosis: Repeated falls (R29.6);Muscle weakness (generalized) (M62.81)   Activity Tolerance Patient tolerated treatment well   Patient Left in bed;with call bell/phone within reach   Nurse Communication Mobility status        Time: 1145-1210 OT Time Calculation (min): 25 min  Charges: OT General Charges $OT Visit: 1 Visit OT Treatments $Self Care/Home Management : 23-37 mins   D , OTR/L ASCOM [phone]    

## 2020-07-02 NOTE — Progress Notes (Signed)
[place] [facility] [place] [facility]) Hospital Liaison RN note:  Received new referral for [facility] out patient palliative program to follow post discharge from Dr. Posey Pronto and Meagan Hagwood, TOC. Patient information given to referral. Plan is for discharge tomorrow home with [facility] home health. Will follow to verigy disposition.  Thank you for this referral.  Zandra Abts, RN [facility] Liaison [phone]

## 2020-07-02 NOTE — Progress Notes (Signed)
   07/02/20 1328  Clinical Encounter Type  Visited With Patient  Visit Type Initial  Referral From Nurse  Consult/Referral To Chaplain  Stress Factors  Patient Stress Factors Health changes;Loss of control   Chaplain was able to speak with pt. [name] was able to voice her concerns. Chaplain used reflective listening. Chaplain prayed with pt, as requested.

## 2020-07-02 NOTE — Consult Note (Signed)
Consultation Note Date: 07/02/2020   Patient Name: [name]  DOB: 10-May-1936  MRN: [id number]  Age / Sex: 85 y.o., female  PCP: [place] Martes Victoriano Lain, NP Referring Physician: Fritzi Mandes, MD  Reason for Consultation: Establishing goals of care  HPI/Patient Profile: 85 y.o. female  with past medical history of NASH cirrhosis, a fib,  admitted on 06/29/2020 with lower extremity edema and AKI. Workup revealed Covid19 positive. She was recently admitted 06/01/2020-06/04/2020 with UTI, small pulmonary emboli, and again 06/12/2020-06/15/2020 for sepsis r/t pneumonia. Palliative medicine consulted for goals of care.    Clinical Assessment and Goals of Care: Evaluated patient and met with her at bedside. She is awake, alert and oriented to her situation.  She tells me she has several problems with her liver, kidney and heart.  Patient lives at home with her ex-spouse. She is looking forward to being discharged home soon with home health. She would want to return to the hospital if she declined again. We talked about possible illness trajectories and repeated hospitalizations.  She shares that for now she is interested in pursuing all aggressive care that will prolong her life.  Code status was discussed. She would not want to be kept on artificial life support for an extended amount of time. We discussed DNR given that all current evidence shows that if she deteriorated to point of needing CPR it would likely not restore her to her current functional status. She wishes to discuss this with her family.   Primary Decision Maker PATIENT    SUMMARY OF RECOMMENDATIONS -Continue current scope of care -TOC referral for outpatient Palliative at home after discharge for further discussion related to [place] and code status where family can be present    Code Status/Advance Care Planning:  DNR  Additional Recommendations  (Limitations, Scope, Preferences):  Full Scope Treatment  Prognosis:    Unable to determine  Discharge Planning: Home with Palliative Services  Primary Diagnoses: Present on Admission: . AKI (acute kidney injury) ([place]) . Debility . Acute pulmonary embolism ([place]) . Breast cancer ([place])   I have reviewed the medical record, interviewed the patient and family, and examined the patient. The following aspects are pertinent.  Past Medical History:  Diagnosis Date  . Arthritis    lower back, knees  . Breast cancer ([place]) 2011   RT LUMPECTOMY  . Chemotherapy follow-up examination 2011   RT BREAST CANCER  . Cirrhosis ([place])   . Personal history of chemotherapy   . Personal history of radiation therapy   . Radiation 2011   RT BREAST CANCER  . Skin cancer   . Wears dentures    full upper, partial lower   Social History   Socioeconomic History  . Marital status: Divorced    Spouse name: Not on file  . Number of children: Not on file  . Years of education: Not on file  . Highest education level: Not on file  Occupational History  . Not on file  Tobacco Use  . Smoking status: Current  Every Day Smoker    Packs/day: 0.50    Types: Cigarettes    Last attempt to quit: 04/28/2014    Years since quitting: 6.1  . Smokeless tobacco: Former Network engineer and Sexual Activity  . Alcohol use: No    Alcohol/week: 0.0 standard drinks    Comment: occasional glass of wine  . Drug use: No  . Sexual activity: Not on file  Other Topics Concern  . Not on file  Social History Narrative  . Not on file   Social Determinants of Health   Financial Resource Strain: Not on file  Food Insecurity: Not on file  Transportation Needs: Not on file  Physical Activity: Not on file  Stress: Not on file  Social Connections: Not on file   Scheduled Meds: . apixaban  5 mg Oral BID  . Chlorhexidine Gluconate Cloth  6 each Topical Daily  . feeding supplement  237 mL Oral TID BM  . furosemide   40 mg Oral Daily  . metoprolol tartrate  12.5 mg Oral BID  . multivitamin with minerals  1 tablet Oral Daily  . pantoprazole  40 mg Oral Daily   Continuous Infusions: PRN Meds:.acetaminophen **OR** acetaminophen, ondansetron **OR** ondansetron (ZOFRAN) IV Medications Prior to Admission:  Prior to Admission medications   Medication Sig Start Date End Date Taking? Authorizing Provider  apixaban (ELIQUIS) 5 MG TABS tablet Take 1 tablet (5 mg total) by mouth 2 (two) times daily. Patient taking differently: Take 5 mg by mouth 2 (two) times daily. Take 10 mg by mouth twice daily for 6 days, then stop 06/10/20  Yes Adhikari, Tamsen Meek, MD  furosemide (LASIX) 40 MG tablet Take 1 tablet (40 mg total) by mouth daily. 06/15/20 07/15/20 Yes Wyvonnia Dusky, MD  potassium chloride (KLOR-CON) 10 MEQ tablet Take 1 tablet (10 mEq total) by mouth daily. 06/15/20 07/15/20 Yes Wyvonnia Dusky, MD  spironolactone (ALDACTONE) 25 MG tablet Take 25 mg by mouth daily. 06/24/20  Yes [provider]  apixaban (ELIQUIS) 5 MG TABS tablet Take 10 mg by mouth 2 (two) times daily.    [provider]  calcium carbonate (OS-CAL) 1250 (500 Ca) MG chewable tablet Chew 1 tablet by mouth daily.    [provider]  Cholecalciferol 25 MCG (1000 UT) tablet Take 1,000 Units by mouth daily.    [provider]  meloxicam (MOBIC) 15 MG tablet Take 15 mg by mouth daily as needed for pain.    [provider]  metoprolol tartrate (LOPRESSOR) 25 MG tablet Take 0.5 tablets (12.5 mg total) by mouth 2 (two) times daily. 06/04/20   Shelly Coss, MD  Multiple Vitamins-Minerals (CENTRUM SILVER 50+WOMEN) TABS Take 1 tablet by mouth daily.    [provider]  pantoprazole (PROTONIX) 40 MG tablet Take 1 tablet (40 mg total) by mouth daily. 06/04/20   Shelly Coss, MD   Allergies  Allergen Reactions  . Gabapentin Itching  . Ivp Dye [Iodinated Diagnostic Agents] Itching   Review of  Systems  Physical Exam Vitals and nursing note reviewed.  Constitutional:      Appearance: Normal appearance.  Cardiovascular:     Rate and Rhythm: Normal rate.  Pulmonary:     Effort: Pulmonary effort is normal.  Neurological:     General: No focal deficit present.     Mental Status: She is alert and oriented to person, place, and time.  Psychiatric:        Mood and Affect: Mood normal.  Behavior: Behavior normal.     Vital Signs: BP 136/81 (BP Location: Right Arm)   Pulse 92   Temp (!) 97.5 F (36.4 C) (Oral)   Resp 16   SpO2 96%  Pain Scale: 0-10   Pain Score: 0-No pain   SpO2: SpO2: 96 % O2 Device:SpO2: 96 % O2 Flow Rate: .   IO: Intake/output summary:   Intake/Output Summary (Last 24 hours) at 07/02/2020 1301 Last data filed at 07/01/2020 1800 Gross per 24 hour  Intake -  Output 300 ml  Net -300 ml    LBM: Last BM Date: 06/30/20 Baseline Weight:   Most recent weight:       Palliative Assessment/Data: PPS: 50%     Thank you for this consult. Palliative medicine will continue to follow and assist as needed.   Time In: 1201 Time Out: 1311 Time Total: 70 minutes Greater than 50%  of this time was spent counseling and coordinating care related to the above assessment and plan.  Signed by: Mariana Kaufman, AGNP-C Palliative Medicine    Please contact Palliative Medicine Team phone at [phone] for questions and concerns.  For individual provider: See Shea Evans

## 2020-07-02 NOTE — TOC Progression Note (Addendum)
Transition of Care [facility]) - Progression Note    Patient Details  Name: [name] MRN: [id number] Date of Birth: 1936-05-16  Transition of Care [facility]) CM/SW [place], LCSW Phone Number: 07/02/2020, 1:49 PM  Clinical Narrative:  Per [employer] Representative [place] patient does qualify for a hospital bed. Asked MD for orders.   2:10- Call from patient's daughter who reported she wants to make sure patient is safe to return home as patient's husband isn't physically able to help patient. Informed her per PT note patient did well this afternoon and walked 100 feet and was safe to return home. Updated MD and PT of daughter's concern as well. Also informed daughter of hospital bed being ordered and plan for DC tomorrow with Home Health per MD. She is in agreement with this.  2:30- TOC consult for OP Palliative referral, referral made to [facility] [facility].   Expected Discharge Plan: [place] Barriers to Discharge: Continued Medical Work up  Expected Discharge Plan and Services Expected Discharge Plan: [place] arrangements for the past 2 months: [place]                 DME Arranged: 3-N-1 DME Agency: [facility] Date DME Agency Contacted: 07/01/20   Representative spoke with at DME Agency: Nash Shearer [facility] Arranged: PT,OT,Nurse's Aide [place]: [place] Date [place]: 07/01/20   Representative spoke with at [place]: [place] ([place]) Interventions    Readmission Risk Interventions No flowsheet data found.

## 2020-07-02 NOTE — NC FL2 (Signed)
[place] LEVEL OF CARE SCREENING TOOL     IDENTIFICATION  Patient Name: [name] Birthdate: 22-Feb-1936 Sex: female Admission Date (Current Location): 06/29/2020  [place] and [place] Number:  Engineering geologist and Address:  [facility], [address], [place], [place] [address]      Provider Number: [id number]  Attending Physician Name and Address:  Fritzi Mandes, MD  Relative Name and Phone Number:       Current Level of Care: Hospital Recommended Level of Care: [place] Prior Approval Number:    Date Approved/Denied:   PASRR Number: [id number]  Discharge Plan: SNF    Current Diagnoses: Patient Active Problem List   Diagnosis Date Noted  . Bilateral leg edema   . Liver cirrhosis secondary to NASH ([place])   . AKI (acute kidney injury) ([place]) 06/29/2020  . Debility 06/29/2020  . Sepsis ([place]) 06/12/2020  . Acute pulmonary embolism ([place]) 06/02/2020  . Breast cancer ([place]) 11/02/2014  . Breast CA ([place]) 01/19/2013    Orientation RESPIRATION BLADDER Height & Weight     Self,Time,Situation,Place  Normal Continent Weight:   Height:     BEHAVIORAL SYMPTOMS/MOOD NEUROLOGICAL BOWEL NUTRITION STATUS   (None)  (None) Incontinent Diet (2 gram sodium)  AMBULATORY STATUS COMMUNICATION OF NEEDS Skin   Extensive Assist Verbally Skin abrasions,Bruising,Other (Comment) (Excoriated location, weeping.)                       Personal Care Assistance Level of Assistance  Bathing,Feeding,Dressing Bathing Assistance: Maximum assistance Feeding assistance: Limited assistance Dressing Assistance: Maximum assistance     Functional Limitations Info  Sight,Hearing,Speech Sight Info: Adequate Hearing Info: Adequate Speech Info: Adequate    SPECIAL CARE FACTORS FREQUENCY  PT (By licensed PT),OT (By licensed OT)     PT Frequency: 5 x week OT Frequency: 5 x week            Contractures Contractures Info: Not  present    Additional Factors Info  Code Status,Allergies,Isolation Precautions Code Status Info: Full code Allergies Info: Gabapentin, Ivp Dye (Iodinated Diagnostic Agents)     Isolation Precautions Info: COVID - tested positive on 1/29     Current Medications (07/02/2020):  This is the current hospital active medication list Current Facility-Administered Medications  Medication Dose Route Frequency Provider Last Rate Last Admin  . acetaminophen (TYLENOL) tablet 325 mg  325 mg Oral Q6H PRN Cox, Amy N, DO   325 mg at 07/01/20 1734   Or  . acetaminophen (TYLENOL) suppository 325 mg  325 mg Rectal Q6H PRN Cox, Amy N, DO      . apixaban (ELIQUIS) tablet 5 mg  5 mg Oral BID Cox, Amy N, DO   5 mg at 07/02/20 0930  . Chlorhexidine Gluconate Cloth 2 % PADS 6 each  6 each Topical Daily Fritzi Mandes, MD   6 each at 07/02/20 [phone]  . feeding supplement (ENSURE ENLIVE / ENSURE PLUS) liquid 237 mL  237 mL Oral TID BM Fritzi Mandes, MD   237 mL at 07/02/20 0938  . furosemide (LASIX) tablet 40 mg  40 mg Oral Daily Fritzi Mandes, MD   40 mg at 07/02/20 0933  . metoprolol tartrate (LOPRESSOR) tablet 12.5 mg  12.5 mg Oral BID Cox, Amy N, DO   12.5 mg at 07/02/20 0930  . multivitamin with minerals tablet 1 tablet  1 tablet Oral Daily Fritzi Mandes, MD   1 tablet at 07/02/20 0929  .  ondansetron (ZOFRAN) tablet 4 mg  4 mg Oral Q6H PRN Cox, Amy N, DO       Or  . ondansetron (ZOFRAN) injection 4 mg  4 mg Intravenous Q6H PRN Cox, Amy N, DO   4 mg at 06/29/20 2145  . pantoprazole (PROTONIX) EC tablet 40 mg  40 mg Oral Daily Cox, Amy N, DO   40 mg at 07/02/20 6269     Discharge Medications: Please see discharge summary for a list of discharge medications.  Relevant Imaging Results:  Relevant Lab Results:   Additional Information SS#: [id number]. Tested positive for COVID on 1/29.  Candie Chroman, LCSW

## 2020-07-03 LAB — CBC
HCT: 38.8 % (ref 36.0–46.0)
Hemoglobin: 12.7 g/dL (ref 12.0–15.0)
MCH: 31.4 pg (ref 26.0–34.0)
MCHC: 32.7 g/dL (ref 30.0–36.0)
MCV: 96 fL (ref 80.0–100.0)
Platelets: 154 10*3/uL (ref 150–400)
RBC: 4.04 MIL/uL (ref 3.87–5.11)
RDW: 16.2 % — ABNORMAL HIGH (ref 11.5–15.5)
WBC: 6.5 10*3/uL (ref 4.0–10.5)
nRBC: 0.3 % — ABNORMAL HIGH (ref 0.0–0.2)

## 2020-07-03 NOTE — TOC Progression Note (Addendum)
Transition of Care [facility]) - Progression Note    Patient Details  Name: [name] MRN: [id number] Date of Birth: 02-12-1936  Transition of Care [facility]) CM/SW [place], LCSW Phone Number: 07/03/2020, 11:54 AM  Clinical Narrative:  Spoke to both of patient's children. They have not received a call yet from [employer] about hospital bed delivery. Asked [employer] to reach out and see if 3-in-1 was delivered to room or if it would be delivered with the bed. Ex-husband will be picking her up today (Lonnie: [phone]). [facility] representative is aware of discharge for today.  1:08 pm: [employer] rep unsure of estimated delivery date but said that family will get a call 30 minutes before. She will call logistics to see if we can get a better idea of timing. She did not see that a 3-in-1 was ordered so MD entered. Have ordered to be delivered to the room before she leaves.   Expected Discharge Plan: [place] Barriers to Discharge: Continued Medical Work up  Expected Discharge Plan and Services Expected Discharge Plan: [place] arrangements for the past 2 months: Single Family Home Expected Discharge Date: 07/03/20               DME Arranged: 3-N-1 DME Agency: [facility] Date DME Agency Contacted: 07/01/20   Representative spoke with at DME Agency: Nash Shearer [facility] Arranged: PT,OT,Nurse's Aide [facility] Agency: [place] Date [place]: 07/01/20   Representative spoke with at [place]: [place] ([place]) Interventions    Readmission Risk Interventions No flowsheet data found.

## 2020-07-03 NOTE — Discharge Instructions (Signed)
High Protein Nutrition Therapy How Much Protein Do You Need? If you have certain medical conditions, you may need more or less protein. Check with your doctor or registered dietitian to find out how much protein you need. The general guidelines for the amount of protein most healthy people need are:  Aim for at least 85 grams of protein daily Foods Recommended Food Amount Protein (grams)  Ground sirloin 3 oz 24  Tofu, firm  cup 20  Tuna fish packed in water 3 oz 20  Pork tenderloin 3 oz 18.4  Chicken breast, boneless/skinless 3 oz 26  Cottage cheese, low fat  cup 13.4  Soy milk 1 cup (8 oz) 11  Soybeans  cup 11  Vegetable or soy patty 1 patty 11  Pumpkin seeds 1 oz 8.5  Milk: fat free, low fat, whole 1 cup (8 oz) 8  Peanut butter, smooth or creamy 2 Tbsp 8  Yogurt 6 oz 8  Egg substitute  cup 7.5  Cheese 1 slice (1 oz) 7  Kidney beans, canned  cup 7  Nuts: peanuts, pistachios, almonds 1 oz 6  Fish (haddock, flounder, perch, pollock) 1 oz 6.5  Egg, whole or hard-boiled 1 egg 6  Sunflower seeds 1 oz 5.5

## 2020-07-03 NOTE — Discharge Summary (Signed)
LAJOYA DOMBEK Hopkins:[id number] DOB: 06-04-1935 DOA: 06/29/2020  PCP: Sallee Lange, NP  Admit date: 06/29/2020 Discharge date: 07/03/2020  Time spent: 45 minutes  Recommendations for Outpatient Follow-up:  1. Close f/u with PCP, will need check of kidney function and electrolytes within 1 week 2. Palliative care consulted 3. Consider follow-up with GI (Dr. Haig Prophet) if consistent with goals of care  4. Asymptomatic covid positive, has been referred for outpatient treatment    Discharge Diagnoses:  Active Problems:   Breast cancer ([facility])   Acute pulmonary embolism ([facility])   AKI (acute kidney injury) ([facility])   Debility   Discharge Condition: stable  Diet recommendation: low sodium  There were no vitals filed for this visit.  History of present illness:  [name] is a 85 y.o. female with medical history significant for paroxysmal atrial fibrillation, history of nonalcoholic hepatic steatosis that has progressed liver cirrhosis with cirrhosis, debility, small segmental pulmonary embolism in the right and left lower lobes currently on Eliquis twice daily, presented to the emergency department for chief concerns of weakness.  She endorses acute weakness and unable to walk since Wednesday (06/26/20) or Thursday (06/27/20), patient is not sure. She states she does not have shortness of breath with laying flat. She endorses shortness of breath with ambulation.  She states that she has had decreased urination in the last week.  She endorses compliance of medications.  However she did state that she has stopped taking her Lasix because she "I felt bad"  Social history: she lives with her divorced ex spouse. They divorced in 1993. He is the father of her three children. She endorses daily tobacco use and quit in November 2021. She endorses infrequent etioh use. She denies recreational drug use.  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 Fib and PE on NOAC, and history of breast cancer who presents with weakness/fatigue along with lower extremity edema and AKI. This is patients 3rd admission in Jan 2022  Acute kidney injury in setting of liver cirrhosis and poor compliance ?Hepatorenal syndrome -Worsening liver enzymes and hyponatremia in setting of noncompliance with medical med recommendations -Patient has been advised to follow-up outpatient with gastroenterologist for the last 12 years but she does not believe that she has liver cirrhosis -Checking PT and INR to calculate meld score -followed by GI and nephrology inpatient. Treated with IV albumin; lasix and spironolactone were resumed  Hypertension-controlled -Resumed home metoprolol 12.5 mg p.o. twice daily  Gen weakness/Debility -PT/OT to see pt-- recommends rehab, family opts for home health with pt/ot -CT the head without contrast --no acute abnormality -Bilateral lower extremity ultrasound NEGATIVE for DVT  NASH/Cirrhosis of liver with gastric varices (per record) ChronicTCP Ascites with recent paracentesis 06/13/2020 --follow GI rec--received IV albumin for 3 days, re-started on diuretics - outpt GI f/u if patient willing and consistent w/ goals of care -outpt palliative care consult  Bilateral subsegmental pulmonary embolism -resumed home apixaban 5 mg p.o. twice daily (dx Jan 2022)  Paroxysmal atrial fibrillation -apixaban 5 mg p.o. twice daily  Chronic Bilateral lower extremity venous changes-with redness however no evidence of cellulitis, this appears to be chronic in nature. U/s negative for dvt --pt used to follow wound clinic in 2019 --wound consult appreciated--will wrap her legs as directed  Covid infection Asymptomatic, was not treated inpatient. unvaccinated - ambulatory referral for outpt covid tx  Procedures:  none   Consultations:  GI,  nephrology  Discharge Exam: Vitals:   07/03/20 0402 07/03/20 0754  BP: (!) 140/94 140/79  Pulse: 88 84  Resp: 16 17  Temp: 98 F (36.7 C) (!) 97.5 F (36.4 C)  SpO2: 93% 95%    General: chronically ill, NAD Cardiovascular: RRR, soft systolic murmur Respiratory: CTAB Extremities: 1+ pitting edema in the LEs Abdomen: soft, non-tender  Discharge Instructions   Discharge Instructions    Amb Referral to Palliative Care   Complete by: As directed    Call MD for:  difficulty breathing, headache or visual disturbances   Complete by: As directed    Call MD for:  persistant dizziness or light-headedness   Complete by: As directed    Call MD for:  persistant nausea and vomiting   Complete by: As directed    Call MD for:  severe uncontrolled pain   Complete by: As directed    Call MD for:  temperature >100.4   Complete by: As directed    Diet - low sodium heart healthy   Complete by: As directed    Discharge wound care:   Complete by: As directed    Compression stockings are recommended   Increase activity slowly   Complete by: As directed      Allergies as of 07/03/2020      Reactions   Gabapentin Itching   Ivp Dye [iodinated Diagnostic Agents] Itching      Medication List    STOP taking these medications   meloxicam 15 MG tablet Commonly known as: MOBIC     TAKE these medications   apixaban 5 MG Tabs tablet Commonly known as: ELIQUIS Take 1 tablet (5 mg total) by mouth 2 (two) times daily. What changed: additional instructions   calcium carbonate 1250 (500 Ca) MG chewable tablet Commonly known as: OS-CAL Chew 1 tablet by mouth daily.   Centrum Silver 50+Women Tabs Take 1 tablet by mouth daily.   Cholecalciferol 25 MCG (1000 UT) tablet Take 1,000 Units by mouth daily.   furosemide 40 MG tablet Commonly known as: Lasix Take 1 tablet (40 mg total) by mouth daily.   metoprolol tartrate 25 MG tablet Commonly known as: LOPRESSOR Take 0.5 tablets (12.5 mg  total) by mouth 2 (two) times daily.   pantoprazole 40 MG tablet Commonly known as: PROTONIX Take 1 tablet (40 mg total) by mouth daily.   potassium chloride 10 MEQ tablet Commonly known as: KLOR-CON Take 1 tablet (10 mEq total) by mouth daily.   spironolactone 25 MG tablet Commonly known as: ALDACTONE Take 25 mg by mouth daily.            Durable Medical Equipment  (From admission, onward)         Start     Ordered   07/02/20 1350  For home use only DME Hospital bed  Once       Question Answer Comment  Length of Need 6 Months   Bed type Semi-electric      07/02/20 1349           Discharge Care Instructions  (From admission, onward)         Start     Ordered   07/03/20 0000  Discharge wound care:       Comments: Compression stockings are recommended   07/03/20 1106         Allergies  Allergen Reactions  . Gabapentin Itching  . Ivp Dye [Iodinated Diagnostic  Agents] Itching      The results of significant diagnostics from this hospitalization (including imaging, microbiology, ancillary and laboratory) are listed below for reference.    Significant Diagnostic Studies: CT ABDOMEN PELVIS WO CONTRAST  Result Date: 06/12/2020 CLINICAL DATA:  Abdominal pain EXAM: CT ABDOMEN AND PELVIS WITHOUT CONTRAST TECHNIQUE: Multidetector CT imaging of the abdomen and pelvis was performed following the standard protocol without IV contrast. COMPARISON:  06/01/2020 FINDINGS: Lower chest: Small bilateral pleural effusions appear new from previous exam. Cardiac enlargement. Aortic atherosclerosis and coronary artery calcifications identified. Soft tissue lesion with adjacent surgical clips noted in the lateral right breast, image 3/2. Hepatobiliary: The liver is atrophic and cirrhotic with a diffuse nodular contour. Within the limitations of unenhanced technique there is no focal liver abnormality. Previous cholecystectomy. No signs of biliary dilatation. Pancreas: Unremarkable.  No pancreatic ductal dilatation or surrounding inflammatory changes. Spleen: Normal in size without focal abnormality. Adrenals/Urinary Tract: Normal appearance of the adrenal glands. Bilateral renal sinus cysts are again noted. Nonobstructing left renal calculi measures 3 mm, image 42/2. Exophytic lesion arising off the inferior pole of right kidney anteriorly is incompletely characterized without IV contrast measuring 1.7 cm, image 48/2. Urinary bladder appears unremarkable. Stomach/Bowel: Stomach is unremarkable. The appendix is visualized and appears normal. No bowel wall thickening, inflammation or distension. Colonic diverticulosis noted. Vascular/Lymphatic: Aortic atherosclerosis. Upper abdominal varices. No abdominopelvic adenopathy. Reproductive: Uterus and bilateral adnexa are unremarkable. Other: There is been interval increase in volume of abdominal and pelvic ascites when compared with 06/01/2020. No focal fluid collection identified. The calcified lesion within the anterior aspect of the upper abdomen is unchanged measuring 1.8 cm, image 48/2. Favor benign abnormality. Musculoskeletal: Status post solid fusion of L3 through L5. Bone graft harvesting sites identified within the left iliac bone. No acute or suspicious osseous findings. IMPRESSION: 1. Morphologic features of the liver compatible with cirrhosis. Stigmata of portal venous hypertension is noted including varices and ascites. When compared with the previous exam there is been interval increase in volume of ascites within the abdomen and pelvis. 2. New small bilateral pleural effusions. 3. Aortic atherosclerosis and coronary artery calcifications. 4. Nonobstructing left renal calculus. Aortic Atherosclerosis (ICD10-I70.0). Electronically Signed   By: Kerby Moors M.D.   On: 06/12/2020 12:55   DG Chest 1 View  Result Date: 06/12/2020 CLINICAL DATA:  Right upper quadrant pain. EXAM: CHEST  1 VIEW COMPARISON:  10/11/2015 FINDINGS: Patchy  opacity at the right base compatible with atelectasis or infiltrate. Streaky/linear density at the left base suggest atelectasis. The cardio pericardial silhouette is enlarged. Bones are diffusely demineralized. IMPRESSION: Bibasilar atelectasis or infiltrate, right greater than left. Electronically Signed   By: Misty Stanley M.D.   On: 06/12/2020 14:57   DG Chest 2 View  Result Date: 06/29/2020 CLINICAL DATA:  Worsening lower extremity edema. EXAM: CHEST - 2 VIEW COMPARISON:  Chest radiograph dated 06/12/2020. FINDINGS: The heart remains enlarged. An airspace opacity in the right lower lung has decreased since prior exam and minimal right basilar atelectasis/airspace disease persists. There is a trace right pleural effusion. The left lung is clear. There is no pneumothorax. There is diffuse osseous demineralization. Degenerative changes are seen in the spine. IMPRESSION: 1. Improved airspace opacity in the right lower lung. 2. Trace right pleural effusion. Electronically Signed   By: Zerita Boers M.D.   On: 06/29/2020 13:46   CT HEAD WO CONTRAST  Result Date: 06/29/2020 CLINICAL DATA:  BILATERAL worsening lower extremity pain and weakness, history  hypertension, breast cancer, atrial fibrillation, question stroke, history cirrhosis EXAM: CT HEAD WITHOUT CONTRAST TECHNIQUE: Contiguous axial images were obtained from the base of the skull through the vertex without intravenous contrast. Sagittal and coronal MPR images reconstructed from axial data set. COMPARISON:  06/01/2020 FINDINGS: Brain: Generalized atrophy. Normal ventricular morphology. No midline shift or mass effect. Small vessel chronic ischemic changes of deep cerebral white matter. No intracranial hemorrhage, mass lesion, evidence of acute infarction, or extra-axial fluid collection. Vascular: Atherosclerotic calcification of internal carotid arteries at skull base. No hyperdense vessels Skull: Intact Sinuses/Orbits: Clear Other: N/A IMPRESSION:  Atrophy with small vessel chronic ischemic changes of deep cerebral white matter. No acute intracranial abnormalities. Electronically Signed   By: Lavonia Dana M.D.   On: 06/29/2020 16:42   US Abdomen Complete  Result Date: 06/29/2020 CLINICAL DATA:  Abdominal pain. EXAM: ABDOMEN ULTRASOUND COMPLETE COMPARISON:  CT dated June 12, 2020 FINDINGS: Gallbladder: The patient is status post prior cholecystectomy. Common bile duct: Diameter: 3 mm Liver: The liver surface is nodular. There is no discrete hepatic mass. Portal vein is patent on color Doppler imaging with normal direction of blood flow towards the liver. IVC: No abnormality visualized. Pancreas: Visualized portion unremarkable. Spleen: Size and appearance within normal limits. Right Kidney: Length: 10 cm. There is no hydronephrosis. There is a lower pole 1.7 cm cyst. Left Kidney: Length: 10.3 cm. Echogenicity within normal limits. No mass or hydronephrosis visualized. Abdominal aorta: No aneurysm visualized. Other findings: There is a small volume of ascites in the upper abdomen. There are bilateral pleural effusions. IMPRESSION: 1. Cirrhotic appearing liver without evidence for discrete hepatic mass. 2. Small volume ascites in the upper abdomen. 3. Incidentally noted are bilateral pleural effusions. Electronically Signed   By: Constance Holster M.D.   On: 06/29/2020 18:49   DG Pelvis Portable  Result Date: 06/29/2020 CLINICAL DATA:  Weakness. Unable to sleep the bed for 1 week. Lower extremity edema and pain. EXAM: PORTABLE PELVIS 1-2 VIEWS COMPARISON:  None. FINDINGS: The cortical margins of the bony pelvis are intact. No fracture. Pubic symphysis and sacroiliac joints are congruent. Degenerative change of both hips with acetabular spurring, right greater than left. There is mild bilateral hip joint space narrowing. No evidence of a vascular necrosis, focal bone lesion or bony destruction. Surgical hardware in the lower lumbar spine is partially  included. IMPRESSION: Mild osteoarthritis of both hips, right greater than left. Electronically Signed   By: Keith Rake M.D.   On: 06/29/2020 15:17   US Venous Img Lower Bilateral (DVT)  Result Date: 06/29/2020 CLINICAL DATA:  Edema EXAM: BILATERAL LOWER EXTREMITY VENOUS DOPPLER ULTRASOUND TECHNIQUE: Gray-scale sonography with compression, as well as color and duplex ultrasound, were performed to evaluate the deep venous system(s) from the level of the common femoral vein through the popliteal and proximal calf veins. COMPARISON:  June 01, 2020 FINDINGS: VENOUS Normal compressibility of the common femoral, superficial femoral, and popliteal veins, as well as the visualized calf veins. Visualized portions of profunda femoral vein and great saphenous vein unremarkable. No filling defects to suggest DVT on grayscale or color Doppler imaging. Doppler waveforms show normal direction of venous flow, normal respiratory plasticity and response to augmentation. OTHER There is nonspecific bilateral lower extremity edema. Limitations: none IMPRESSION: 1. No DVT. 2. There is nonspecific bilateral lower extremity edema. Electronically Signed   By: Constance Holster M.D.   On: 06/29/2020 18:45   US Paracentesis  Result Date: 06/13/2020 INDICATION: 85 year old with cirrhosis and  ascites. Request for diagnostic paracentesis. EXAM: ULTRASOUND GUIDED DIAGNOSTIC PARACENTESIS MEDICATIONS: None. COMPLICATIONS: None immediate. PROCEDURE: Informed written consent was obtained from the patient after a discussion of the risks, benefits and alternatives to treatment. A timeout was performed prior to the initiation of the procedure. Initial ultrasound scanning demonstrates a small amount of ascites within the right upper abdominal quadrant. The right lower abdomen was prepped and draped in the usual sterile fashion. 1% lidocaine was used for local anesthesia. Following this, a 19 gauge Yueh catheter was introduced using  ultrasound guidance. An ultrasound image was saved for documentation purposes. The paracentesis was performed. The catheter was removed and a dressing was applied. The patient tolerated the procedure well without immediate post procedural complication. Patient received post-procedure intravenous albumin; see nursing notes for details. FINDINGS: A total of approximately 200 mL of yellow opaque fluid was removed. Samples were sent to the laboratory as requested by the clinical team. IMPRESSION: Successful ultrasound-guided paracentesis yielding 200 mL of peritoneal fluid. Electronically Signed   By: Markus Daft M.D.   On: 06/13/2020 12:57   ECHOCARDIOGRAM COMPLETE  Result Date: 06/04/2020    ECHOCARDIOGRAM REPORT   Patient Name:   [name] Date of Exam: 06/03/2020 Medical Rec #:  [id number]      Height:       67.0 in Accession #:    [id number]     Weight:       165.0 lb Date of Birth:  11/30/1935      BSA:          1.863 m Patient Age:    12 years       BP:           123/81 mmHg Patient Gender: F              HR:           51 bpm. Exam Location:  [facility] Procedure: 2D Echo, Cardiac Doppler and Color Doppler Indications:     Bacteremia R78.81  History:         Patient has no prior history of Echocardiogram examinations.  Sonographer:     Alyse Low Roar Referring Phys:  [id number] AMRIT ADHIKARI Diagnosing Phys: Nelva Bush MD IMPRESSIONS  1. Left ventricular ejection fraction, by estimation, is 55 to 60%. The left ventricle has normal function. Left ventricular endocardial border not optimally defined to evaluate regional wall motion. Left ventricular diastolic parameters are indeterminate.  2. Right ventricular systolic function is normal. The right ventricular size is normal. Moderately increased right ventricular wall thickness.  3. Left atrial size was mildly dilated.  4. Right atrial size was mildly dilated.  5. The mitral valve was not well visualized. Mild mitral valve regurgitation.  6. The aortic valve is  tricuspid. Aortic valve regurgitation is trivial. No aortic stenosis is present. FINDINGS  Left Ventricle: Left ventricular ejection fraction, by estimation, is 55 to 60%. The left ventricle has normal function. Left ventricular endocardial border not optimally defined to evaluate regional wall motion. The left ventricular internal cavity size was normal in size. There is borderline left ventricular hypertrophy. Left ventricular diastolic parameters are indeterminate. Right Ventricle: The right ventricular size is normal. Moderately increased right ventricular wall thickness. Right ventricular systolic function is normal. Left Atrium: Left atrial size was mildly dilated. Right Atrium: Right atrial size was mildly dilated. Pericardium: Trivial pericardial effusion is present. Mitral Valve: The mitral valve was not well visualized. Mild mitral valve regurgitation. Tricuspid Valve: The tricuspid valve  is grossly normal. Tricuspid valve regurgitation is mild. Aortic Valve: The aortic valve is tricuspid. Aortic valve regurgitation is trivial. No aortic stenosis is present. Aortic valve peak gradient measures 4.2 mmHg. Pulmonic Valve: The pulmonic valve was grossly normal. Pulmonic valve regurgitation is mild. No evidence of pulmonic stenosis. Aorta: The aortic root is normal in size and structure. Venous: The inferior vena cava was not well visualized. IAS/Shunts: The interatrial septum was not well visualized.  LEFT VENTRICLE PLAX 2D LVIDd:         4.40 cm  Diastology LVIDs:         3.30 cm  LV e' medial:    7.83 cm/s LV PW:         1.00 cm  LV E/e' medial:  12.2 LV IVS:        1.00 cm  LV e' lateral:   10.40 cm/s LVOT diam:     2.00 cm  LV E/e' lateral: 9.2 LVOT Area:     3.14 cm  RIGHT VENTRICLE RV Mid diam:    3.10 cm RV S prime:     10.10 cm/s LEFT ATRIUM             Index       RIGHT ATRIUM           Index LA diam:        3.85 cm 2.07 cm/m  RA Area:     21.90 cm LA Vol (A2C):   47.6 ml 25.54 ml/m RA Volume:    61.90 ml  33.22 ml/m LA Vol (A4C):   48.6 ml 26.08 ml/m LA Biplane Vol: 48.6 ml 26.08 ml/m  AORTIC VALVE                PULMONIC VALVE AV Area (Vmax): 2.19 cm    PV Vmax:        0.68 m/s AV Vmax:        102.00 cm/s PV Peak grad:   1.8 mmHg AV Peak Grad:   4.2 mmHg    RVOT Peak grad: 1 mmHg LVOT Vmax:      71.10 cm/s  AORTA Ao Root diam: 3.10 cm MITRAL VALVE               TRICUSPID VALVE MV Area (PHT): 3.27 cm    TR Peak grad:   17.0 mmHg MV Decel Time: 232 msec    TR Vmax:        206.00 cm/s MV E velocity: 95.50 cm/s                            SHUNTS                            Systemic Diam: 2.00 cm Nelva Bush MD Electronically signed by Nelva Bush MD Signature Date/Time: 06/04/2020/12:47:25 PM    Final     Microbiology: Recent Results (from the past 240 hour(s))  SARS CORONAVIRUS 2 (TAT 6-24 HRS) Nasopharyngeal Nasopharyngeal Swab     Status: Abnormal   Collection Time: 06/29/20 12:24 PM   Specimen: Nasopharyngeal Swab  Result Value Ref Range Status   SARS Coronavirus 2 POSITIVE (A) NEGATIVE Final    Comment: (NOTE) SARS-CoV-2 target nucleic acids are DETECTED.  The SARS-CoV-2 RNA is generally detectable in upper and lower respiratory specimens during the acute phase of infection. Positive results are indicative of the presence of SARS-CoV-2 RNA.  Clinical correlation with patient history and other diagnostic information is  necessary to determine patient infection status. Positive results do not rule out bacterial infection or co-infection with other viruses.  The expected result is Negative.  Fact Sheet for Patients: SugarRoll.be  Fact Sheet for Healthcare Providers: https://www.woods-mathews.com/  This test is not yet approved or cleared by the [place] FDA and  has been authorized for detection and/or diagnosis of SARS-CoV-2 by FDA under an Emergency Use Authorization (EUA). This EUA will remain  in effect (meaning this test  can be used) for the duration of the COVID-19 declaration under Section 564(b)(1) of the Act, 21 U. S.C. section 360bbb-3(b)(1), unless the authorization is terminated or revoked sooner.   Performed at [facility], [place] [address], [place], [place] [address]   Urine Culture     Status: None   Collection Time: 06/30/20  4:54 PM   Specimen: Urine, Clean Catch  Result Value Ref Range Status   Specimen Description   Final    URINE, CLEAN CATCH Performed at [facility], [address], [place], [place] [address]    Special Requests   Final    NONE Performed at [facility], [address] [place], [place] [address]    Culture   Final    NO GROWTH Performed at [facility], [place] [address], [place], [place] [address]    Report Status 07/02/2020 FINAL  Final     Labs: Basic Metabolic Panel: Recent Labs  Lab 06/29/20 1224 06/30/20 0415 07/01/20 1048 07/02/20 0740  NA 131* 135 133* 135  K 4.4 3.8 3.8 4.1  CL 98 99 99 101  CO2 20* 25 23 22   GLUCOSE 113* 83 121* 79  BUN 48* 54* 62* 62*  CREATININE 1.52* 1.67* 1.99* 1.69*  CALCIUM 7.7* 7.6* 7.8* 8.2*  MG 2.1  --   --   --    Liver Function Tests: Recent Labs  Lab 06/29/20 1224 06/30/20 0415  AST 72* 36  ALT 44 26  ALKPHOS 157* 83  BILITOT 1.4* 1.5*  PROT 5.8* 4.6*  ALBUMIN 2.0* 2.5*   No results for input(s): LIPASE, AMYLASE in the last 168 hours. No results for input(s): AMMONIA in the last 168 hours. CBC: Recent Labs  Lab 06/29/20 1224 06/30/20 0520 07/03/20 0516  WBC 9.6 6.6 6.5  NEUTROABS 7.2  --   --   HGB 16.0* 11.9* 12.7  HCT 48.1* 35.9* 38.8  MCV 93.8 95.2 96.0  PLT 136* 81* 154   Cardiac Enzymes: Recent Labs  Lab 06/29/20 1224  CKTOTAL 107   BNP: BNP (last 3 results) Recent Labs    06/01/20 1534 06/29/20 1224  BNP 137.6* 190.5*    ProBNP (last 3 results) No results for input(s): PROBNP in the last 8760 hours.  CBG: Recent Labs  Lab  07/01/20 1714  GLUCAP 99       Signed:  Desma Maxim MD.  [facility] 07/03/2020, 11:07 AM

## 2020-07-03 NOTE — TOC Transition Note (Signed)
Transition of Care [facility]) - CM/SW Discharge Note   Patient Details  Name: [name] MRN: [id number] Date of Birth: [birth year]  [facility]) CM/SW Contact:  Candie Chroman, LCSW Phone Number: 07/03/2020, 2:28 PM   Clinical Narrative:  Patient has orders to discharge home today. [facility] is aware. 3-in-1 delivered to the room. Hospital bed will be at the home in less than an hour. Daughter aware and said ex-husband should be here waiting to pick her up. Sent secure chat to RN and charge RN to notify. No further concerns. CSW signing off.  Final next level of care: [place] Barriers to Discharge: Barriers Resolved   Patient Goals and CMS Choice Patient states their goals for this hospitalization and ongoing recovery are:: home with home health CMS Medicare.gov Compare Post Acute Care list provided to:: Patient Choice offered to / list presented to : [place]  Discharge Placement                Patient to be transferred to facility by: Ex-husband will pick her up Name of family member notified: Kathi Der and Geni Bers Patient and family notified of of transfer: 07/03/20  Discharge Plan and Services                DME Arranged: 3-N-1,Hospital bed DME Agency: [facility] Date DME Agency Contacted: 07/03/20   Representative spoke with at DME Agency: [place]: PT,OT,Nurse's Aide [place]: [place] Date [place]: 07/03/20   Representative spoke with at [place]: [place] Determinants of Health ([place]) Interventions     Readmission Risk Interventions No flowsheet data found.

## 2020-07-03 NOTE — Care Management Important Message (Signed)
Important Message  Patient Details  Name: [name] MRN: [id number] Date of Birth: 10-28-1935   Medicare Important Message Given:  Other (see comment)  Patient is in a isolation room.  I tried calling the patient two times but did not get an answer to review the Important Message from Medicare.  Juliann Pulse A Mykelle Cockerell 07/03/2020, 12:34 PM

## 2020-07-03 NOTE — Progress Notes (Signed)
[facility], [place] 07/03/20  Subjective:   Hospital day # 3  Feels okay Still notices edema in her legs, managed with compression socks States she is able to eat some although appetite is poor     Renal: 02/01 0701 - 02/02 0700 In: -  Out: 400 [Urine:400] Lab Results  Component Value Date   CREATININE 1.69 (H) 07/02/2020   CREATININE 1.99 (H) 07/01/2020   CREATININE 1.67 (H) 06/30/2020     Objective:  Vital signs in last 24 hours:  Temp:  [97.5 F (36.4 C)-98.7 F (37.1 C)] 97.7 F (36.5 C) (02/02 1203) Pulse Rate:  [70-88] 82 (02/02 1203) Resp:  [16-18] 17 (02/02 1203) BP: (108-140)/(55-94) 125/85 (02/02 1203) SpO2:  [93 %-96 %] 94 % (02/02 1203)  Weight change:  There were no vitals filed for this visit.  Intake/Output:    Intake/Output Summary (Last 24 hours) at 07/03/2020 1553 Last data filed at 07/03/2020 0431 Gross per 24 hour  Intake --  Output 400 ml  Net -400 ml     Physical Exam: General:  Elderly, frail female, sitting up in the bed  HEENT  moist oral mucous membranes, anicteric  Pulm/lungs  normal breathing effort on room air, clear anteriorly and laterally  CVS/Heart  no rub  Abdomen:   Soft, nontender, nondistended  Extremities:  1-2+ pitting edema bilaterally  Neurologic:  Alert, able to answer questions appropriately  Skin:  Scattered ecchymosis    Basic Metabolic Panel:  Recent Labs  Lab 06/29/20 1224 06/30/20 0415 07/01/20 1048 07/02/20 0740  NA 131* 135 133* 135  K 4.4 3.8 3.8 4.1  CL 98 99 99 101  CO2 20* 25 23 22   GLUCOSE 113* 83 121* 79  BUN 48* 54* 62* 62*  CREATININE 1.52* 1.67* 1.99* 1.69*  CALCIUM 7.7* 7.6* 7.8* 8.2*  MG 2.1  --   --   --      CBC: Recent Labs  Lab 06/29/20 1224 06/30/20 0520 07/03/20 0516  WBC 9.6 6.6 6.5  NEUTROABS 7.2  --   --   HGB 16.0* 11.9* 12.7  HCT 48.1* 35.9* 38.8  MCV 93.8 95.2 96.0  PLT 136* 81* 154     No results found for: HEPBSAG, HEPBSAB,  HEPBIGM    Microbiology:  Recent Results (from the past 240 hour(s))  SARS CORONAVIRUS 2 (TAT 6-24 HRS) Nasopharyngeal Nasopharyngeal Swab     Status: Abnormal   Collection Time: 06/29/20 12:24 PM   Specimen: Nasopharyngeal Swab  Result Value Ref Range Status   SARS Coronavirus 2 POSITIVE (A) NEGATIVE Final    Comment: (NOTE) SARS-CoV-2 target nucleic acids are DETECTED.  The SARS-CoV-2 RNA is generally detectable in upper and lower respiratory specimens during the acute phase of infection. Positive results are indicative of the presence of SARS-CoV-2 RNA. Clinical correlation with patient history and other diagnostic information is  necessary to determine patient infection status. Positive results do not rule out bacterial infection or co-infection with other viruses.  The expected result is Negative.  Fact Sheet for Patients: SugarRoll.be  Fact Sheet for Healthcare Providers: https://www.woods-mathews.com/  This test is not yet approved or cleared by the [place] FDA and  has been authorized for detection and/or diagnosis of SARS-CoV-2 by FDA under an Emergency Use Authorization (EUA). This EUA will remain  in effect (meaning this test can be used) for the duration of the COVID-19 declaration under Section 564(b)(1) of the Act, 21 U. S.C. section 360bbb-3(b)(1), unless the  authorization is terminated or revoked sooner.   Performed at [facility], [place] [address], [place], [place] [address]   Urine Culture     Status: None   Collection Time: 06/30/20  4:54 PM   Specimen: Urine, Clean Catch  Result Value Ref Range Status   Specimen Description   Final    URINE, CLEAN CATCH Performed at [facility], [address], [place], [place] [address]    Special Requests   Final    NONE Performed at [facility], [address] [place], [place] [address]    Culture   Final    NO GROWTH Performed at  [facility], [place] [address], [place], [place] [address]    Report Status 07/02/2020 FINAL  Final    Coagulation Studies: No results for input(s): LABPROT, INR in the last 72 hours.  Urinalysis: No results for input(s): COLORURINE, LABSPEC, PHURINE, GLUCOSEU, HGBUR, BILIRUBINUR, KETONESUR, PROTEINUR, UROBILINOGEN, NITRITE, LEUKOCYTESUR in the last 72 hours.  Invalid input(s): APPERANCEUR    Imaging: No results found.   Medications:    . apixaban  5 mg Oral BID  . calcium carbonate  1,250 mg Oral Daily  . Chlorhexidine Gluconate Cloth  6 each Topical Daily  . cholecalciferol  1,000 Units Oral Daily  . feeding supplement  237 mL Oral TID BM  . furosemide  40 mg Oral Daily  . metoprolol tartrate  12.5 mg Oral BID  . multivitamin with minerals  1 tablet Oral Daily  . pantoprazole  40 mg Oral Daily  . potassium chloride  10 mEq Oral Daily  . spironolactone  25 mg Oral Daily   ondansetron **OR** ondansetron (ZOFRAN) IV  Assessment/ Plan:  85 y.o. female with paroxysmal atrial fibrillation, nonalcoholic hepatic steatosis progressed to liver cirrhosis, debility, history of small segmental pulmonary embolism in the right and left lower lobes requiring anticoagulation with Eliquis   admitted on 06/29/2020 for Weakness [R53.1] Bilateral leg edema [R60.0] AKI (acute kidney injury) ([place]) [N17.9] Abdominal pain [R10.9] Leg pain, anterior, left [M79.605] Pain in both lower extremities [M79.604, M79.605]  #Acute kidney injury AKI likely secondary to ATN History of nonsteroidal use Few episodes of hypotension are noted 02/01 0701 - 02/02 0700 In: -  Out: 400 [Urine:400]  Lab Results  Component Value Date   CREATININE 1.69 (H) 07/02/2020   CREATININE 1.99 (H) 07/01/2020   CREATININE 1.67 (H) 06/30/2020  Imaging: Ultrasound abdomen June 29, 2020: Cirrhosis, small volume ascites,  right lower pole 1.7 cm cyst, no mass or hydronephrosis  Baseline creatinine of 0.99 from  June 14, 2020 Current creatinine trend is improving Patient is nonoliguric Electrolytes and volume status are acceptable.  No acute indication for dialysis  #Peripheral edema Secondary from third spacing in context of liver cirrhosis Continued on furosemide 40 mg daily and spironolactone 25 mg daily Avoid hypotension Consider compression socks during the day  #COVID-19 infection  tested + January 29 Currently on room air   LOS: [place] 2/2/20223:53 PM  [place], [place]  Note: This note was prepared with Dragon dictation. Any transcription errors are unintentional

## 2020-07-04 ENCOUNTER — Encounter: Payer: Self-pay | Admitting: Infectious Diseases

## 2020-07-04 NOTE — Progress Notes (Signed)
Patient is out of window for any of the available EUA outpatient therapies given symptoms started > 7 days prior.   The patient was not called, chart review completed with recent hospitalization. She seemed to be "asymptomatic" per their documentation however clear she has had sudden change in fatigue presentation since 1/27.   The patient was not called   Summer Madeira, MSN, NP-C [place] for Infectious Disease [email or web address] Pager: [phone] Office: [place]: [phone]

## 2020-07-05 ENCOUNTER — Telehealth: Payer: Self-pay | Admitting: Primary Care

## 2020-07-05 NOTE — Telephone Encounter (Signed)
Spoke with patient regarding Palliative referral/services and all questions were answered and she was in agreement with scheduling visit.  I have scheduled an In-person Consult for 07/15/20 @ 1 PM.

## 2020-07-10 DIAGNOSIS — C229 Malignant neoplasm of liver, not specified as primary or secondary: Secondary | ICD-10-CM | POA: Insufficient documentation

## 2020-07-10 DIAGNOSIS — K7469 Other cirrhosis of liver: Secondary | ICD-10-CM | POA: Insufficient documentation

## 2020-07-11 LAB — FUNGAL ORGANISM REFLEX

## 2020-07-11 LAB — FUNGUS CULTURE WITH STAIN

## 2020-07-11 LAB — FUNGUS CULTURE RESULT

## 2020-07-12 DIAGNOSIS — K219 Gastro-esophageal reflux disease without esophagitis: Secondary | ICD-10-CM | POA: Insufficient documentation

## 2020-07-13 DIAGNOSIS — K7581 Nonalcoholic steatohepatitis (NASH): Secondary | ICD-10-CM | POA: Insufficient documentation

## 2020-07-13 DIAGNOSIS — Z86711 Personal history of pulmonary embolism: Secondary | ICD-10-CM | POA: Insufficient documentation

## 2020-07-13 DIAGNOSIS — Z853 Personal history of malignant neoplasm of breast: Secondary | ICD-10-CM | POA: Insufficient documentation

## 2020-07-15 ENCOUNTER — Other Ambulatory Visit: Payer: Medicare Other | Admitting: Primary Care

## 2020-07-15 ENCOUNTER — Other Ambulatory Visit: Payer: Self-pay

## 2020-07-15 DIAGNOSIS — N179 Acute kidney failure, unspecified: Secondary | ICD-10-CM

## 2020-07-15 DIAGNOSIS — K7460 Unspecified cirrhosis of liver: Secondary | ICD-10-CM

## 2020-07-15 DIAGNOSIS — R600 Localized edema: Secondary | ICD-10-CM

## 2020-07-15 DIAGNOSIS — Z515 Encounter for palliative care: Secondary | ICD-10-CM

## 2020-07-15 NOTE — Progress Notes (Signed)
[facility] Palliative Care Consult Note Telephone: [phone]  Fax: [phone]    Date of encounter: 07/15/20 PATIENT NAME: [name] [place] [address] [phone] (home)  DOB: 1936-05-20 MRN: [id number]  PRIMARY CARE PROVIDER:    Sallee Lange, NP,  [address] [place] [address] [phone]  REFERRING PROVIDER:   Sallee Lange, NP [place],  [place] [address] [phone]  RESPONSIBLE PARTY:   Extended Emergency Contact Information Primary Emergency Contact: [place] of [place] Phone: [phone] Mobile Phone: [phone] Relation: Son Secondary Emergency Contact: Juel Burrow Address: [address]          [place], [place] [address] Johnnette Litter of [place] Phone: [phone] Mobile Phone: [phone] Relation: Daughter  I met face to face with patient and family in  home. Palliative Care was asked to follow this patient by consultation request of [place], Victoriano Lain, * to help address advance care planning and goals of care. This is the initial visit.   ASSESSMENT AND RECOMMENDATIONS:   1. Advance Care Planning/Goals of Care: Goals include to maximize quality of life and symptom management. Our advance care planning conversation included a discussion about:     The value and importance of advance care planning   Experiences with loved ones who have been seriously ill or have died   Exploration of personal, cultural or spiritual beliefs that might influence medical decisions   Exploration of goals of care in the event of a sudden injury or illness   Identification and preparation of a healthcare agent - None  Decision not to resuscitate   States her SO and she are not married, and their children make their decisions. She refuses to return to [facility] and drive to [facility].   2. Symptom Management:   States smoked 30  pack years, having stopped several times over the years and has been smoke free again x 4 months.   Endorses recent poor health, had been functional prior. States she gets depressed and anxiety at hs.  Endorses losses and poor sleep patterns. Endorses insomnia, newly afraid of the dark, hs fear of not awakening. Discussed sleeping pill.   Depression: States poor sleep and depression. I would recommend SSRI or trazodone. I would not recommend a benzodiazepine due to poor result in elderly populations.  CHF: Has 4+ LE edema to knees.  Endorse first time she has had this problem. Does not understand this diagnosis. Endorses dry weight is now 165 lbs and now is 188 lbs.   Covid: States she didn't have covid despite documentation.  Refuses the immunizations.  DME: Getting w/c tomorrow. Refuses hospital bed. Had had one but she refuses. Tried a BSC  But it was too unstable Has elevated toilet seats, shower chair and walker.   3. Follow up Palliative Care Visit: Palliative care will not follow as pt was admitted to [facility] hospice today, and patient did not mention this until after the interview.  4. Family /Caregiver/Community Supports: Lives in home with spouse.   Has [facility] currently.  5. Cognitive / Functional decline: A and O x 3. Decline since thanksgiving (cleaning, mowing, cooking), mainly due  To 3 recent hospital illnesses. Now dependent in most ADLS. Has adult children, in general area.   I spent 60 minutes providing this consultation,  from 1300 to 1400. More than 50% of the time in this consultation was spent in  counseling and care coordination.  CODE STATUS: DNR  PPS: 50%  HOSPICE ELIGIBILITY/DIAGNOSIS: TBD  Subjective:  CHIEF COMPLAINT: debility, depression  HISTORY OF PRESENT ILLNESS:  [name] is a 85 y.o. year old female  with new health decline from UTI in early Jan. She was referred for palliative. but apparently has been admitted to hospice services  earlier today.  We are asked to consult around advance care planning and complex medical decision making.    Review and summarization of old Epic records shows or history from other than patient. Review or lab tests, radiology,  or medicine. Review of case with family member    History obtained from review of EMR, discussion with primary team, and  interview with family, caregiver  and/or [name]. Records reviewed and summarized above.   CURRENT PROBLEM LIST:  Patient Active Problem List   Diagnosis Date Noted  . Bilateral leg edema   . Liver cirrhosis secondary to NASH ([place])   . AKI (acute kidney injury) ([place]) 06/29/2020  . Debility 06/29/2020  . Sepsis ([place]) 06/12/2020  . Acute pulmonary embolism ([place]) 06/02/2020  . Breast cancer ([place]) 11/02/2014  . Breast CA ( [place]) 01/19/2013   PAST MEDICAL HISTORY:  Active Ambulatory Problems    Diagnosis Date Noted  . Breast cancer ([place]) 11/02/2014  . Breast CA ([place]) 01/19/2013  . Acute pulmonary embolism ([place]) 06/02/2020  . Sepsis ([place]) 06/12/2020  . AKI (acute kidney injury) ([place]) 06/29/2020  . Debility 06/29/2020  . Bilateral leg edema   . Liver cirrhosis secondary to NASH [facility])    Resolved Ambulatory Problems    Diagnosis Date Noted  . No Resolved Ambulatory Problems   Past Medical History:  Diagnosis Date  . Arthritis   . Chemotherapy follow-up examination 2011  . Cirrhosis ([place])   . Personal history of chemotherapy   . Personal history of radiation therapy   . Radiation 2011  . Skin cancer   . Wears dentures    SOCIAL HX:  Social History   Tobacco Use  . Smoking status: Current Every Day Smoker    Packs/day: 0.50    Types: Cigarettes    Last attempt to quit: 04/28/2014    Years since quitting: 6.2  . Smokeless tobacco: Former Network engineer Use Topics  . Alcohol use: No    Alcohol/week: 0.0 standard drinks    Comment: occasional glass of wine   FAMILY HX:  Family History  Problem Relation Age of Onset   . Cancer Father        prostate  . Cancer Brother        prostate  . Breast cancer Neg Hx       ALLERGIES:  Allergies  Allergen Reactions  . Gabapentin Itching  . Ivp Dye [Iodinated Diagnostic Agents] Itching     PERTINENT MEDICATIONS:  Outpatient Encounter Medications as of 07/15/2020  Medication Sig  . apixaban (ELIQUIS) 5 MG TABS tablet Take 1 tablet (5 mg total) by mouth 2 (two) times daily. (Patient taking differently: Take 5 mg by mouth 2 (two) times daily. Take 10 mg by mouth twice daily for 6 days, then stop)  . calcium carbonate (OS-CAL) 1250 (500 Ca) MG chewable tablet Chew 1 tablet by mouth daily.  . Cholecalciferol 25 MCG (1000 UT) tablet Take 1,000 Units by mouth daily.  . furosemide (LASIX) 40 MG tablet Take 1 tablet (40 mg total) by mouth daily.  . metoprolol tartrate (LOPRESSOR) 25 MG tablet Take 0.5 tablets (12.5  mg total) by mouth 2 (two) times daily.  . Multiple Vitamins-Minerals (CENTRUM SILVER 50+WOMEN) TABS Take 1 tablet by mouth daily.  . pantoprazole (PROTONIX) 40 MG tablet Take 1 tablet (40 mg total) by mouth daily.  . potassium chloride (KLOR-CON) 10 MEQ tablet Take 1 tablet (10 mEq total) by mouth daily.  Marland Kitchen spironolactone (ALDACTONE) 25 MG tablet Take 25 mg by mouth daily.   No facility-administered encounter medications on file as of 07/15/2020.    Objective: ROS  General: NAD EYES: denies vision changes ENMT: denies dysphagia Cardiovascular: denies chest pain Pulmonary: denies  cough, denies increased SOB Abdomen: endorses fair appetite, denies  constipation, endorses continence of bowel GU: denies dysuria, endorses continence of urine MSK:  endorses ROM limitations, no falls reported Skin: left foot wound with compression wraps Neurological: endorses weakness, denies pain, endorses insomnia Psych: Endorses positive mood Heme/lymph/immuno: denies bruises, abnormal bleeding  Physical Exam: Current and past weights:endorses dry weight at 165  lbs. States 188 lbs with edema Constitutional: 111/64 HR 82 RR 18 General: frail appearing, WNWD EYES: anicteric sclera, lids intact, no discharge  ENMT: intact hearing,oral mucous membranes moist, dentition intact CV:  4+LE edema Pulmonary:  no increased work of breathing, no cough, no audible wheezes, room air Abdomen: intake 75%,  no ascites GU: deferred MSK: mod sarcopenia, decreased ROM in all extremities, no contractures of LE, ambulatory with walker Skin: warm and dry, no rashes or wounds on visible skin Neuro: Generalized weakness, no cognitive impairment Psych: anxious affect, A and O x 3 Hem/lymph/immuno: no widespread bruising   Thank you for the opportunity to participate in the care of [name].  The palliative care team will continue to follow. Please call our office at [phone] if we can be of additional assistance.  Jason Coop, NP , DNP, MPH, AGPCNP-BC, ACHPN   COVID-19 PATIENT SCREENING TOOL  Person answering questions: _______self____________   1.  Is the patient or any family member in the home showing any signs or symptoms regarding respiratory infection?                  Person with Symptom  ______________na___________ a. Fever/chills/headache                                                        Yes___ No__X_            b. Shortness of breath                                                            Yes___ No__X_           c. Cough/congestion                                               Yes___  No__X_          d. Muscle/Body aches/pains  Yes___ No__X_         e. Gastrointestinal symptoms (diarrhea,nausea)             Yes___ No__X_         f. Sudden loss of smell or taste      Yes___ No__X_        2. Within the past 10 days, has anyone living in the home had any contact with someone with or under investigation for COVID-19?    Yes___ No__X__   Person __________________

## 2020-07-23 ENCOUNTER — Other Ambulatory Visit: Payer: Self-pay | Admitting: Internal Medicine

## 2020-07-23 DIAGNOSIS — I480 Paroxysmal atrial fibrillation: Secondary | ICD-10-CM

## 2020-07-23 DIAGNOSIS — R0602 Shortness of breath: Secondary | ICD-10-CM

## 2020-08-06 ENCOUNTER — Encounter
Admission: RE | Admit: 2020-08-06 | Discharge: 2020-08-06 | Disposition: A | Discharge status: Home / Self Care | Source: Ambulatory Visit | Attending: Internal Medicine | Admitting: Internal Medicine

## 2020-08-06 ENCOUNTER — Other Ambulatory Visit: Payer: Self-pay

## 2020-08-06 DIAGNOSIS — I480 Paroxysmal atrial fibrillation: Secondary | ICD-10-CM | POA: Diagnosis not present

## 2020-08-06 DIAGNOSIS — R0602 Shortness of breath: Secondary | ICD-10-CM | POA: Insufficient documentation

## 2020-08-06 MED ORDER — TECHNETIUM TC 99M TETROFOSMIN IV KIT
10.5800 | PACK | Freq: Once | INTRAVENOUS | Status: AC | PRN
  Administered 2020-08-06: 10.58 via INTRAVENOUS

## 2020-08-06 MED ORDER — REGADENOSON 0.4 MG/5ML IV SOLN
0.4000 mg | Freq: Once | INTRAVENOUS | Status: AC
  Administered 2020-08-06: 0.4 mg via INTRAVENOUS
  Filled 2020-08-06: qty 5

## 2020-08-06 MED ORDER — TECHNETIUM TC 99M TETROFOSMIN IV KIT
30.0000 | PACK | Freq: Once | INTRAVENOUS | Status: AC | PRN
  Administered 2020-08-06: 30.723 via INTRAVENOUS

## 2020-08-08 LAB — NM MYOCAR MULTI W/SPECT W/WALL MOTION / EF
Estimated workload: 1 METS
Exercise duration (min): 1 min
Exercise duration (sec): 2 s
LV dias vol: 45 mL (ref 46–106)
LV sys vol: 14 mL
MPHR: 137 {beats}/min
Peak HR: 100 {beats}/min
Percent HR: 72 %
Rest HR: 91 {beats}/min
SDS: 0
SRS: 7
SSS: 0
TID: 1.27

## 2020-08-12 DIAGNOSIS — I482 Chronic atrial fibrillation, unspecified: Secondary | ICD-10-CM | POA: Insufficient documentation

## 2020-08-12 DIAGNOSIS — R42 Dizziness and giddiness: Secondary | ICD-10-CM | POA: Insufficient documentation

## 2020-09-09 ENCOUNTER — Other Ambulatory Visit (INDEPENDENT_AMBULATORY_CARE_PROVIDER_SITE_OTHER): Payer: Self-pay | Admitting: Vascular Surgery

## 2020-09-09 DIAGNOSIS — I739 Peripheral vascular disease, unspecified: Secondary | ICD-10-CM

## 2020-09-09 DIAGNOSIS — S91301A Unspecified open wound, right foot, initial encounter: Secondary | ICD-10-CM

## 2020-09-10 ENCOUNTER — Encounter (INDEPENDENT_AMBULATORY_CARE_PROVIDER_SITE_OTHER): Payer: Self-pay | Admitting: Vascular Surgery

## 2020-09-10 ENCOUNTER — Encounter (INDEPENDENT_AMBULATORY_CARE_PROVIDER_SITE_OTHER): Payer: Self-pay

## 2020-10-10 ENCOUNTER — Encounter (INDEPENDENT_AMBULATORY_CARE_PROVIDER_SITE_OTHER): Payer: Self-pay | Admitting: Vascular Surgery

## 2020-10-10 ENCOUNTER — Ambulatory Visit (INDEPENDENT_AMBULATORY_CARE_PROVIDER_SITE_OTHER): Payer: Medicare Other | Admitting: Vascular Surgery

## 2020-10-10 ENCOUNTER — Other Ambulatory Visit: Payer: Self-pay

## 2020-10-10 ENCOUNTER — Ambulatory Visit (INDEPENDENT_AMBULATORY_CARE_PROVIDER_SITE_OTHER): Payer: Medicare Other

## 2020-10-10 VITALS — BP 101/67 | HR 85 | Resp 16 | Ht 68.0 in

## 2020-10-10 DIAGNOSIS — I890 Lymphedema, not elsewhere classified: Secondary | ICD-10-CM | POA: Diagnosis not present

## 2020-10-10 DIAGNOSIS — G629 Polyneuropathy, unspecified: Secondary | ICD-10-CM | POA: Insufficient documentation

## 2020-10-10 DIAGNOSIS — I739 Peripheral vascular disease, unspecified: Secondary | ICD-10-CM | POA: Diagnosis not present

## 2020-10-10 DIAGNOSIS — S91301A Unspecified open wound, right foot, initial encounter: Secondary | ICD-10-CM

## 2020-10-10 DIAGNOSIS — I83003 Varicose veins of unspecified lower extremity with ulcer of ankle: Secondary | ICD-10-CM

## 2020-10-10 DIAGNOSIS — E782 Mixed hyperlipidemia: Secondary | ICD-10-CM | POA: Diagnosis not present

## 2020-10-10 DIAGNOSIS — L97302 Non-pressure chronic ulcer of unspecified ankle with fat layer exposed: Secondary | ICD-10-CM

## 2020-10-10 DIAGNOSIS — I482 Chronic atrial fibrillation, unspecified: Secondary | ICD-10-CM | POA: Diagnosis not present

## 2020-10-10 DIAGNOSIS — M1990 Unspecified osteoarthritis, unspecified site: Secondary | ICD-10-CM | POA: Insufficient documentation

## 2020-10-10 DIAGNOSIS — I2510 Atherosclerotic heart disease of native coronary artery without angina pectoris: Secondary | ICD-10-CM | POA: Insufficient documentation

## 2020-10-11 DIAGNOSIS — I890 Lymphedema, not elsewhere classified: Secondary | ICD-10-CM | POA: Insufficient documentation

## 2020-10-11 DIAGNOSIS — I83003 Varicose veins of unspecified lower extremity with ulcer of ankle: Secondary | ICD-10-CM | POA: Insufficient documentation

## 2020-10-11 NOTE — Progress Notes (Signed)
MRN : [id number]  [name] is a 85 y.o. (Apr 24, 1936) female who presents with chief complaint of  Chief Complaint  Patient presents with  . New Patient (Initial Visit)    Ref Baker PVD right foot wound  .  History of Present Illness:   Patient is seen for evaluation of leg pain and swelling associated with new onset ulceration bilaterally.   She notes the there are two ulcers on the right and one on the left.  The patient first noticed the swelling remotely. The swelling is associated with pain and discoloration. The pain and swelling worsens with prolonged dependency and improves with elevation. The pain is unrelated to activity.  The patient notes that in the morning the legs are better but the leg symptoms worsened throughout the course of the day. The patient has also noted a progressive worsening of the discoloration in the ankle and shin area.   The patient notes that an ulcer has developed acutely without specific trauma and since it occurred it has been very slow to heal.  There is a moderate amount of drainage associated with the open area.  The wound is also painful.  The patient denies claudication symptoms or rest pain symptoms.  The patient denies DJD and LS spine disease.  The patient has not had any past angiography, interventions or vascular surgery.  Elevation makes the leg symptoms better, dependency makes them much worse. The patient denies any recent changes in medications.  The patient has not been wearing graduated compression.  The patient has a history of DVT or PE. There is no prior history of superficial phlebitis. There is no history of primary lymphedema.  No history of malignancies. No history of trauma or groin or pelvic surgery. There is no history of radiation treatment to the groin or pelvis   ABI's done today are normal bilaterally    Current Meds  Medication Sig  . apixaban (ELIQUIS) 5 MG TABS tablet Take 1 tablet (5 mg total) by mouth 2  (two) times daily. (Patient taking differently: Take 5 mg by mouth 2 (two) times daily. Take 10 mg by mouth twice daily for 6 days, then stop)  . metoprolol tartrate (LOPRESSOR) 25 MG tablet Take 0.5 tablets (12.5 mg total) by mouth 2 (two) times daily.    Past Medical History:  Diagnosis Date  . Arthritis    lower back, knees  . Breast cancer ([place]) 2011   RT LUMPECTOMY  . Chemotherapy follow-up examination 2011   RT BREAST CANCER  . Cirrhosis ([place])   . Personal history of chemotherapy   . Personal history of radiation therapy   . Radiation 2011   RT BREAST CANCER  . Skin cancer   . Wears dentures    full upper, partial lower    Past Surgical History:  Procedure Laterality Date  . BREAST BIOPSY Right 2014   RT CORE W/CLIP - NEG  . BREAST EXCISIONAL BIOPSY Right 2011   breast ca  . CATARACT EXTRACTION W/PHACO Right 04/15/2016   Procedure: CATARACT EXTRACTION PHACO AND INTRAOCULAR LENS PLACEMENT (IOC);  Surgeon: Leandrew Koyanagi, MD;  Location: [facility];  Service: Ophthalmology;  Laterality: Right;  . CATARACT EXTRACTION W/PHACO Left 07/01/2016   Procedure: CATARACT EXTRACTION PHACO AND INTRAOCULAR LENS PLACEMENT (IOC);  Surgeon: Leandrew Koyanagi, MD;  Location: [facility];  Service: Ophthalmology;  Laterality: Left;  Left eye  . CHOLECYSTECTOMY      Social History Social History   Tobacco  Use  . Smoking status: Current Every Day Smoker    Packs/day: 0.50    Types: Cigarettes    Last attempt to quit: 04/28/2014    Years since quitting: 6.4  . Smokeless tobacco: Former Network engineer Use Topics  . Alcohol use: No    Alcohol/week: 0.0 standard drinks    Comment: occasional glass of wine  . Drug use: No    Family History Family History  Problem Relation Age of Onset  . Cancer Father        prostate  . Cancer Brother        prostate  . Breast cancer Neg Hx   No family history of bleeding/clotting disorders, porphyria or autoimmune  disease   Allergies  Allergen Reactions  . Gabapentin Itching  . Ivp Dye [Iodinated Diagnostic Agents] Itching  . Metrizamide Itching     REVIEW OF SYSTEMS (Negative unless checked)  Constitutional: [] Weight loss  [] Fever  [] Chills Cardiac: [] Chest pain   [] Chest pressure   [] Palpitations   [] Shortness of breath when laying flat   [] Shortness of breath with exertion. Vascular:  [] Pain in legs with walking   [x] Pain in legs at rest  [x] History of PE   [] Phlebitis   [x] Swelling in legs   [] Varicose veins   [] Non-healing ulcers Pulmonary:   [] Uses home oxygen   [] Productive cough   [] Hemoptysis   [] Wheeze  [] COPD   [] Asthma Neurologic:  [] Dizziness   [] Seizures   [] History of stroke   [] History of TIA  [] Aphasia   [] Vissual changes   [] Weakness or numbness in arm   [x] Weakness or numbness in leg Musculoskeletal:   [] Joint swelling   [] Joint pain   [] Low back pain Hematologic:  [] Easy bruising  [] Easy bleeding   [] Hypercoagulable state   [] Anemic Gastrointestinal:  [] Diarrhea   [] Vomiting  [] Gastroesophageal reflux/heartburn   [] Difficulty swallowing. Genitourinary:  [] Chronic kidney disease   [] Difficult urination  [] Frequent urination   [] Blood in urine Skin:  [x] Rashes   [x] Ulcers  Psychological:  [] History of anxiety   []  History of major depression.  Physical Examination  Vitals:   10/10/20 1453  BP: 101/67  Pulse: 85  Resp: 16  Height: 5\' 8"  (1.727 m)   Body mass index is 28.26 kg/m. Gen: WD/WN, Mild generalized distress seen in a wheelchair Head: /AT, + temporalis wasting.  Ear/Nose/Throat: Hearing grossly intact, nares w/o erythema or drainage, poor dentition Eyes: PER, EOMI, sclera nonicteric.  Neck: Supple, no masses.  No bruit or JVD.  Pulmonary:  Good air movement, clear to auscultation bilaterally, no use of accessory muscles.  Cardiac: RRR, normal S1, S2, no Murmurs. Vascular: 2-3+ edema bilaterally with severe venous changes bilaterally.  Venous ulcers noted in  the ankle area bilaterally, noninfected. Vessel Right Left  Radial Palpable Palpable  PT Palpable Palpable  DP Palpable Palpable  Gastrointestinal: soft, non-distended. No guarding/no peritoneal signs.  Musculoskeletal: M/S 5/5 throughout.  No deformity or atrophy.  Neurologic: CN 2-12 intact. Pain and light touch intact in extremities.  Symmetrical.  Speech is fluent. Motor exam as listed above. Psychiatric: Judgment intact, Mood & affect appropriate for pt's clinical situation. Dermatologic: Severe venous stasis dermatitis with ulcers present.  No changes consistent with cellulitis. Lymph : + lichenification or skin changes of chronic lymphedema.  CBC Lab Results  Component Value Date   WBC 6.5 07/03/2020   HGB 12.7 07/03/2020   HCT 38.8 07/03/2020   MCV 96.0 07/03/2020   PLT 154 07/03/2020  BMET    Component Value Date/Time   NA 135 07/02/2020 0740   K 4.1 07/02/2020 0740   CL 101 07/02/2020 0740   CO2 22 07/02/2020 0740   GLUCOSE 79 07/02/2020 0740   BUN 62 (H) 07/02/2020 0740   CREATININE 1.69 (H) 07/02/2020 0740   CALCIUM 8.2 (L) 07/02/2020 0740   GFRNONAA 30 (L) 07/02/2020 0740   GFRAA >60 07/09/2019 1548   CrCl cannot be calculated (Patient's most recent lab result is older than the maximum 21 days allowed.).  COAG Lab Results  Component Value Date   INR 1.1 06/29/2020   INR 1.1 06/13/2020   INR 1.0 06/12/2020    Radiology VAS [place] ABI WITH/WO TBI  Result Date: 10/10/2020  LOWER EXTREMITY DOPPLER STUDY Patient Name:  [name]  Date of Exam:   10/10/2020 Medical Rec #: [id number]       Accession #:    [id number] Date of Birth: [birth year]       Patient Gender: F Patient Age:   44Y Exam Location:  [facility] Procedure:      VAS [place] ABI WITH/WO TBI Referring Phys: [id number] Hamer --------------------------------------------------------------------------------  Indications: Rest pain.  Limitations: Today's exam was limited due to Patient could  not get out of              wheelchair. Performing Technologist: Concha Norway RVT  Examination Guidelines: A complete evaluation includes at minimum, Doppler waveform signals and systolic blood pressure reading at the level of bilateral brachial, anterior tibial, and posterior tibial arteries, when vessel segments are accessible. Bilateral testing is considered an integral part of a complete examination. Photoelectric Plethysmograph (PPG) waveforms and toe systolic pressure readings are included as required and additional duplex testing as needed. Limited examinations for reoccurring indications may be performed as noted.  ABI Findings: +--------+------------------+-----+---------+--------+ Right   Rt Pressure (mmHg)IndexWaveform Comment  +--------+------------------+-----+---------+--------+ [id number]                                      +--------+------------------+-----+---------+--------+ ATA     169               1.41 triphasic         +--------+------------------+-----+---------+--------+ PTA     174               1.45 triphasic         +--------+------------------+-----+---------+--------+ +--------+------------------+-----+---------+-------+ Left    Lt Pressure (mmHg)IndexWaveform Comment +--------+------------------+-----+---------+-------+ [id number]                                     +--------+------------------+-----+---------+-------+ ATA     166               1.38 triphasic        +--------+------------------+-----+---------+-------+ PTA     130               1.08 triphasic        +--------+------------------+-----+---------+-------+  Summary: Right: Resting right ankle-brachial index is within normal range. No evidence of significant right lower extremity arterial disease. Left: Resting left ankle-brachial index is within normal range. No evidence of significant left lower extremity arterial disease.  *See table(s) above for measurements and  observations.  Electronically signed by Hortencia Pilar MD on 10/10/2020 at 4:50:56 PM.    Final  Assessment/Plan 1. Venous stasis ulcer of ankle with fat layer exposed, unspecified laterality, unspecified whether varicose veins present ([place]) No surgery or intervention at this point in time.    I have had a long discussion with the patient regarding venous insufficiency and why it  causes symptoms, specifically venous ulceration . I have discussed with the patient the chronic skin changes that accompany venous insufficiency and the long term sequela such as infection and recurring  ulceration.  Patient will be placed in [employer] which will be changed weekly drainage permitting.  In addition, behavioral modification including several periods of elevation of the lower extremities during the day will be continued. Achieving a position with the ankles at heart level was stressed to the patient  The patient is instructed to begin routine exercise, especially walking on a daily basis  Patient should undergo duplex ultrasound of the venous system to ensure that DVT or reflux is not present.  Following the review of the ultrasound the patient will follow up in one week to reassess the degree of swelling and the control that Unna therapy is offering.   The patient can be assessed for graduated compression stockings or wraps as well as a Lymph Pump once the ulcers are healed.   2. Lymphedema I have had a long discussion with the patient regarding swelling and why it  causes symptoms.  Patient will begin wearing graduated compression stockings class 1 (20-30 mmHg) on a daily basis a prescription was given. The patient will  beginning wearing the stockings first thing in the morning and removing them in the evening. The patient is instructed specifically not to sleep in the stockings.   In addition, behavioral modification will be initiated.  This will include frequent elevation, use of over the  counter pain medications and exercise such as walking.  I have reviewed systemic causes for chronic edema such as liver, kidney and cardiac etiologies.  The patient denies problems with these organ systems.    Consideration for a lymph pump will also be made based upon the effectiveness of conservative therapy.  This would help to improve the edema control and prevent sequela such as ulcers and infections   3. Chronic a-fib (HCC) Continue antiarrhythmia medications as already ordered, these medications have been reviewed and there are no changes at this time.  Continue anticoagulation as ordered by Cardiology Service   4. Hyperlipidemia, mixed Continue statin as ordered and reviewed, no changes at this time     Hortencia Pilar, MD  10/11/2020 6:23 AM

## 2020-10-14 ENCOUNTER — Ambulatory Visit (INDEPENDENT_AMBULATORY_CARE_PROVIDER_SITE_OTHER): Payer: Medicare Other | Admitting: Nurse Practitioner

## 2020-10-14 ENCOUNTER — Other Ambulatory Visit: Payer: Self-pay

## 2020-10-14 VITALS — BP 129/85 | HR 54 | Ht 68.0 in | Wt 158.0 lb

## 2020-10-14 DIAGNOSIS — L97302 Non-pressure chronic ulcer of unspecified ankle with fat layer exposed: Secondary | ICD-10-CM

## 2020-10-14 DIAGNOSIS — I83003 Varicose veins of unspecified lower extremity with ulcer of ankle: Secondary | ICD-10-CM | POA: Diagnosis not present

## 2020-10-14 NOTE — Progress Notes (Signed)
History of Present Illness  There is no documented history at this time  Assessments & Plan   There are no diagnoses linked to this encounter.    Additional instructions  Subjective:  Patient presents with venous ulcer of the Bilateral lower extremity.    Procedure:  3 layer unna wrap was placed Bilateral lower extremity.   Plan:   Follow up in one week.  

## 2020-10-15 ENCOUNTER — Telehealth (INDEPENDENT_AMBULATORY_CARE_PROVIDER_SITE_OTHER): Payer: Self-pay

## 2020-10-15 NOTE — Telephone Encounter (Signed)
I called pt's Home Care with [facility] and left a message for Autum the Manager to Call our office  back to add unna wraps toher nurse visits at home and that we will bring her back in the office in 4 weeks and at that time she might continue the wraps or be taken out.

## 2020-10-18 ENCOUNTER — Other Ambulatory Visit: Payer: Self-pay

## 2020-10-18 ENCOUNTER — Encounter: Payer: Self-pay | Admitting: Emergency Medicine

## 2020-10-18 ENCOUNTER — Emergency Department: Payer: Medicare Other

## 2020-10-18 ENCOUNTER — Inpatient Hospital Stay
Admission: EM | Admit: 2020-10-18 | Discharge: 2020-10-22 | DRG: 871 | Disposition: A | Discharge status: Skilled Nursing Facility | Payer: Medicare Other | Attending: Internal Medicine | Admitting: Internal Medicine

## 2020-10-18 DIAGNOSIS — F17210 Nicotine dependence, cigarettes, uncomplicated: Secondary | ICD-10-CM | POA: Diagnosis present

## 2020-10-18 DIAGNOSIS — Z20822 Contact with and (suspected) exposure to covid-19: Secondary | ICD-10-CM | POA: Diagnosis present

## 2020-10-18 DIAGNOSIS — Z8042 Family history of malignant neoplasm of prostate: Secondary | ICD-10-CM

## 2020-10-18 DIAGNOSIS — N184 Chronic kidney disease, stage 4 (severe): Secondary | ICD-10-CM | POA: Diagnosis present

## 2020-10-18 DIAGNOSIS — I890 Lymphedema, not elsewhere classified: Secondary | ICD-10-CM

## 2020-10-18 DIAGNOSIS — L89312 Pressure ulcer of right buttock, stage 2: Secondary | ICD-10-CM | POA: Diagnosis present

## 2020-10-18 DIAGNOSIS — M79606 Pain in leg, unspecified: Secondary | ICD-10-CM

## 2020-10-18 DIAGNOSIS — K7581 Nonalcoholic steatohepatitis (NASH): Secondary | ICD-10-CM | POA: Diagnosis not present

## 2020-10-18 DIAGNOSIS — I2510 Atherosclerotic heart disease of native coronary artery without angina pectoris: Secondary | ICD-10-CM | POA: Diagnosis present

## 2020-10-18 DIAGNOSIS — N170 Acute kidney failure with tubular necrosis: Secondary | ICD-10-CM | POA: Diagnosis present

## 2020-10-18 DIAGNOSIS — L97329 Non-pressure chronic ulcer of left ankle with unspecified severity: Secondary | ICD-10-CM | POA: Diagnosis present

## 2020-10-18 DIAGNOSIS — A419 Sepsis, unspecified organism: Principal | ICD-10-CM | POA: Diagnosis present

## 2020-10-18 DIAGNOSIS — Z7901 Long term (current) use of anticoagulants: Secondary | ICD-10-CM

## 2020-10-18 DIAGNOSIS — K219 Gastro-esophageal reflux disease without esophagitis: Secondary | ICD-10-CM | POA: Diagnosis present

## 2020-10-18 DIAGNOSIS — Z888 Allergy status to other drugs, medicaments and biological substances status: Secondary | ICD-10-CM | POA: Diagnosis not present

## 2020-10-18 DIAGNOSIS — I878 Other specified disorders of veins: Secondary | ICD-10-CM | POA: Diagnosis present

## 2020-10-18 DIAGNOSIS — Z9221 Personal history of antineoplastic chemotherapy: Secondary | ICD-10-CM

## 2020-10-18 DIAGNOSIS — Z85828 Personal history of other malignant neoplasm of skin: Secondary | ICD-10-CM

## 2020-10-18 DIAGNOSIS — E872 Acidosis: Secondary | ICD-10-CM | POA: Diagnosis present

## 2020-10-18 DIAGNOSIS — R6521 Severe sepsis with septic shock: Secondary | ICD-10-CM | POA: Diagnosis present

## 2020-10-18 DIAGNOSIS — L97309 Non-pressure chronic ulcer of unspecified ankle with unspecified severity: Secondary | ICD-10-CM | POA: Diagnosis present

## 2020-10-18 DIAGNOSIS — L89322 Pressure ulcer of left buttock, stage 2: Secondary | ICD-10-CM | POA: Diagnosis present

## 2020-10-18 DIAGNOSIS — R6520 Severe sepsis without septic shock: Secondary | ICD-10-CM | POA: Diagnosis not present

## 2020-10-18 DIAGNOSIS — M7989 Other specified soft tissue disorders: Secondary | ICD-10-CM | POA: Diagnosis present

## 2020-10-18 DIAGNOSIS — I83028 Varicose veins of left lower extremity with ulcer other part of lower leg: Secondary | ICD-10-CM | POA: Diagnosis present

## 2020-10-18 DIAGNOSIS — Z853 Personal history of malignant neoplasm of breast: Secondary | ICD-10-CM

## 2020-10-18 DIAGNOSIS — Z923 Personal history of irradiation: Secondary | ICD-10-CM | POA: Diagnosis not present

## 2020-10-18 DIAGNOSIS — L03116 Cellulitis of left lower limb: Secondary | ICD-10-CM | POA: Diagnosis not present

## 2020-10-18 DIAGNOSIS — I482 Chronic atrial fibrillation, unspecified: Secondary | ICD-10-CM | POA: Diagnosis not present

## 2020-10-18 DIAGNOSIS — Z79899 Other long term (current) drug therapy: Secondary | ICD-10-CM

## 2020-10-18 DIAGNOSIS — N179 Acute kidney failure, unspecified: Secondary | ICD-10-CM

## 2020-10-18 DIAGNOSIS — K7460 Unspecified cirrhosis of liver: Secondary | ICD-10-CM | POA: Diagnosis present

## 2020-10-18 DIAGNOSIS — I83003 Varicose veins of unspecified lower extremity with ulcer of ankle: Secondary | ICD-10-CM | POA: Diagnosis present

## 2020-10-18 DIAGNOSIS — M170 Bilateral primary osteoarthritis of knee: Secondary | ICD-10-CM | POA: Diagnosis present

## 2020-10-18 LAB — COMPREHENSIVE METABOLIC PANEL
ALT: 30 U/L (ref 0–44)
AST: 44 U/L — ABNORMAL HIGH (ref 15–41)
Albumin: 3 g/dL — ABNORMAL LOW (ref 3.5–5.0)
Alkaline Phosphatase: 129 U/L — ABNORMAL HIGH (ref 38–126)
Anion gap: 10 (ref 5–15)
BUN: 75 mg/dL — ABNORMAL HIGH (ref 8–23)
CO2: 19 mmol/L — ABNORMAL LOW (ref 22–32)
Calcium: 8.6 mg/dL — ABNORMAL LOW (ref 8.9–10.3)
Chloride: 110 mmol/L (ref 98–111)
Creatinine, Ser: 2.21 mg/dL — ABNORMAL HIGH (ref 0.44–1.00)
GFR, Estimated: 22 mL/min — ABNORMAL LOW (ref >60)
Glucose, Bld: 98 mg/dL (ref 70–99)
Potassium: 4.4 mmol/L (ref 3.5–5.1)
Sodium: 139 mmol/L (ref 135–145)
Total Bilirubin: 1.6 mg/dL — ABNORMAL HIGH (ref 0.3–1.2)
Total Protein: 5.8 g/dL — ABNORMAL LOW (ref 6.5–8.1)

## 2020-10-18 LAB — CBC
HCT: 38 % (ref 36.0–46.0)
Hemoglobin: 12.3 g/dL (ref 12.0–15.0)
MCH: 31.6 pg (ref 26.0–34.0)
MCHC: 32.4 g/dL (ref 30.0–36.0)
MCV: 97.7 fL (ref 80.0–100.0)
Platelets: 243 10*3/uL (ref 150–400)
RBC: 3.89 MIL/uL (ref 3.87–5.11)
RDW: 18.3 % — ABNORMAL HIGH (ref 11.5–15.5)
WBC: 8.5 10*3/uL (ref 4.0–10.5)
nRBC: 0.2 % (ref 0.0–0.2)

## 2020-10-18 LAB — LACTIC ACID, PLASMA
Lactic Acid, Venous: 3.3 mmol/L (ref 0.5–1.9)
Lactic Acid, Venous: 4.5 mmol/L (ref 0.5–1.9)
Lactic Acid, Venous: 3.9 mmol/L (ref 0.5–1.9)

## 2020-10-18 MED ORDER — PANTOPRAZOLE SODIUM 40 MG PO TBEC
40.0000 mg | DELAYED_RELEASE_TABLET | Freq: Every day | ORAL | Status: DC
  Administered 2020-10-18 – 2020-10-22 (×5): 40 mg via ORAL
  Filled 2020-10-18 (×5): qty 1

## 2020-10-18 MED ORDER — SODIUM CHLORIDE 0.9 % IV SOLN
Freq: Two times a day (BID) | INTRAVENOUS | Status: DC
  Filled 2020-10-18: qty 1
  Filled 2020-10-18: qty 10

## 2020-10-18 MED ORDER — APIXABAN 5 MG PO TABS: 5.0000 mg | ORAL_TABLET | Freq: Two times a day (BID) | ORAL | Status: DC

## 2020-10-18 MED ORDER — SODIUM CHLORIDE 0.9 % IV BOLUS (SEPSIS)
500.0000 mL | Freq: Once | INTRAVENOUS | Status: AC
  Administered 2020-10-18: 500 mL via INTRAVENOUS

## 2020-10-18 MED ORDER — CEFAZOLIN SODIUM-DEXTROSE 1-4 GM/50ML-% IV SOLN: 1.0000 g | Freq: Two times a day (BID) | INTRAVENOUS | Status: DC

## 2020-10-18 MED ORDER — LACTATED RINGERS IV SOLN: INTRAVENOUS | Status: DC

## 2020-10-18 MED ORDER — APIXABAN 5 MG PO TABS
5.0000 mg | ORAL_TABLET | Freq: Two times a day (BID) | ORAL | Status: DC
  Administered 2020-10-18: 5 mg via ORAL
  Filled 2020-10-18: qty 1

## 2020-10-18 MED ORDER — ONDANSETRON HCL 4 MG/2ML IJ SOLN: 4.0000 mg | Freq: Four times a day (QID) | INTRAMUSCULAR | Status: DC | PRN

## 2020-10-18 MED ORDER — CEFAZOLIN SODIUM-DEXTROSE 1-4 GM/50ML-% IV SOLN: 1.0000 g | Freq: Three times a day (TID) | INTRAVENOUS | Status: DC

## 2020-10-18 MED ORDER — CEFAZOLIN SODIUM-DEXTROSE 1-4 GM/50ML-% IV SOLN
1.0000 g | Freq: Two times a day (BID) | INTRAVENOUS | Status: DC
  Filled 2020-10-18 (×3): qty 50

## 2020-10-18 MED ORDER — ONDANSETRON HCL 4 MG PO TABS: 4.0000 mg | ORAL_TABLET | Freq: Four times a day (QID) | ORAL | Status: DC | PRN

## 2020-10-18 MED ORDER — SODIUM CHLORIDE 0.9 % IV SOLN
Freq: Two times a day (BID) | INTRAVENOUS | Status: DC
  Filled 2020-10-18 (×2): qty 100

## 2020-10-18 MED ORDER — METOPROLOL TARTRATE 25 MG PO TABS
12.5000 mg | ORAL_TABLET | Freq: Two times a day (BID) | ORAL | Status: DC
  Administered 2020-10-18 – 2020-10-22 (×8): 12.5 mg via ORAL
  Filled 2020-10-18 (×8): qty 1

## 2020-10-18 MED ORDER — SODIUM CHLORIDE 0.9 % IV SOLN
2.0000 g | INTRAVENOUS | Status: DC
  Administered 2020-10-18: 2 g via INTRAVENOUS
  Filled 2020-10-18: qty 20

## 2020-10-18 MED ORDER — LACTATED RINGERS IV BOLUS
500.0000 mL | Freq: Once | INTRAVENOUS | Status: AC
  Administered 2020-10-18: 500 mL via INTRAVENOUS

## 2020-10-18 MED ORDER — CEFAZOLIN IV (FOR PTA / DISCHARGE USE ONLY): 1.0000 g | Freq: Two times a day (BID) | INTRAVENOUS | Status: DC

## 2020-10-18 NOTE — ED Notes (Signed)
IV team refused to place IV at this time due to not having IV fluids/antibiotics ordered at this time. IV RN stated they cannot place IV due to needing blood work. IV RN stated that MD will need to do fem stick for blood work. Corky Downs, MD aware.

## 2020-10-18 NOTE — ED Notes (Signed)
Unable to obtain blood work by this RN, Gaffer, and Kennyth Lose, Therapist, sports. IV will not give back blood at this time. Lab called and will send someone to get blood.

## 2020-10-18 NOTE — Progress Notes (Signed)
IVT consulted for PIV placement w/blood draw.  Upon assessment, pt has PIV placed.  No current orders indicating the need for a second site or IV meds. Flushed current PIV; flushed well and clamped.  Advised RN, Curt Bears of findings.  RN states she currently needs a blood draw and unable to draw from current line. Per policy, IVT unable to place PIV for sole purpose of blood draw.  If need arises for a second PIV site and unable to obtain access, advised to place a consult for PIV placement. Referred to MD regarding difficulty getting blood draw.

## 2020-10-18 NOTE — ED Notes (Signed)
Patient placed on pure wick °

## 2020-10-18 NOTE — ED Notes (Signed)
Pharmacy tech at bedside 

## 2020-10-18 NOTE — Sepsis Progress Note (Signed)
Notified MD and bedside Rn of rising lactic acid. Will sign off sepsis monitoring at this time.

## 2020-10-18 NOTE — Consult Note (Signed)
[facility] Nurse Consult Note: Reason for Consult:Chronic venous insufficiency, now with cellulitis.  Is followed in the community by [facility]. Wound type:Venous insufficiency, infection Pressure Injury POA: N/A Measurement:N/A Wound bed:N/A Drainage (amount, consistency, odor) N/A Periwound:erythematous above knee and including medial thighs Dressing procedure/placement/frequency: I have provided Nursing with Guidance for the care of this patient's bilateral LEs using a daily soap and water cleanse followed by application of topical antimicrobial nonadherent dressings as needed to open areas (xeroform). This is to be followed by the application of a "dry boot" i.e., Kerlix roll gauze wrapped from just below toes to just below knees with heel inclusive and topped with ACE bandages wrapped in an identical manner.  The heels are to be placed into Prevalon boots for mild elevation and pressure redistribution/pressure injury prevention.  A sacral silicone bordered foam dressing is to be placed for sacral PI prevention.  Patient is to return to the care of the [facility] (please note that it appears some recent visits have been missed) upon discharge with follow up from her PCP.  [facility] nursing team will not follow, but will remain available to this patient, the nursing and medical teams.  Please re-consult if needed. Thanks, Maudie Flakes, MSN, RN, [place], Arther Abbott  Pager# [phone]

## 2020-10-18 NOTE — ED Triage Notes (Signed)
Patient arrives via EMS from home for left leg swellling/reddness/warmth that started around 0830 this morning. Patient states she normally gets swelling in her legs but never redness. Patient AOx4 at this time.

## 2020-10-18 NOTE — ED Notes (Signed)
Critical result: Lactic Acid- 3.3 Kinner, MD aware.

## 2020-10-18 NOTE — ED Provider Notes (Signed)
[facility] Emergency Department Provider Note   ____________________________________________    I have reviewed the triage vital signs and the nursing notes.   HISTORY  Chief Complaint Leg Swelling     HPI [name] is a 85 y.o. female who presents with complaints of pain and redness to her left lower leg.  Patient recently saw vascular for edema in the legs, some venous stasis ulcers.  Has had legs wrapped.  Woke up this morning and left leg was red and painful, denies injury to the area.  No fevers or chills.  Has not taken anything for this.  Past Medical History:  Diagnosis Date  . Arthritis    lower back, knees  . Breast cancer ([place]) 2011   RT LUMPECTOMY  . Chemotherapy follow-up examination 2011   RT BREAST CANCER  . Cirrhosis ([place])   . Personal history of chemotherapy   . Personal history of radiation therapy   . Radiation 2011   RT BREAST CANCER  . Skin cancer   . Wears dentures    full upper, partial lower    Patient Active Problem List   Diagnosis Date Noted  . Venous ulcer of ankle ([place]) 10/11/2020  . Lymphedema 10/11/2020  . ASCVD (arteriosclerotic cardiovascular disease) 10/10/2020  . Neuropathy 10/10/2020  . Osteoarthritis 10/10/2020  . Chronic a-fib ([place]) 08/12/2020  . Dizziness 08/12/2020  . NASH (nonalcoholic steatohepatitis) 07/13/2020  . Personal history of breast cancer 07/13/2020  . Personal history of pulmonary embolism 07/13/2020  . Gastro-esophageal reflux disease without esophagitis 07/12/2020  . Other cirrhosis of liver ([place]) 07/10/2020  . Malignant neoplasm of liver, not specified as primary or secondary ([place]) 07/10/2020  . Debility 06/29/2020  . Sepsis ([place]) 06/12/2020  . Bilateral leg edema 06/07/2020  . Hyperlipidemia, mixed 06/07/2020  . SOBOE (shortness of breath on exertion) 06/07/2020  . Acute pulmonary embolism ([place]) 06/02/2020  . History of lumbar surgery 07/22/2018  . Spinal stenosis of  lumbar region at multiple levels 07/22/2018  . LBP (low back pain) 02/14/2015  . 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 Splenomegaly 11/21/2014  . Pain and swelling of lower leg 07/30/2014  . Osteopenia 06/01/2014  . Leg cramps 05/30/2014  . Breast cancer ([place]) 01/19/2013  . Breast CA ([place]) 01/19/2013    Past Surgical History:  Procedure Laterality Date  . BREAST BIOPSY Right 2014   RT CORE W/CLIP - NEG  . BREAST EXCISIONAL BIOPSY Right 2011   breast ca  . CATARACT EXTRACTION W/PHACO Right 04/15/2016   Procedure: CATARACT EXTRACTION PHACO AND INTRAOCULAR LENS PLACEMENT (IOC);  Surgeon: Leandrew Koyanagi, MD;  Location: [facility];  Service: Ophthalmology;  Laterality: Right;  . CATARACT EXTRACTION W/PHACO Left 07/01/2016   Procedure: CATARACT EXTRACTION PHACO AND INTRAOCULAR LENS PLACEMENT (IOC);  Surgeon: Leandrew Koyanagi, MD;  Location: [facility];  Service: Ophthalmology;  Laterality: Left;  Left eye  . CHOLECYSTECTOMY      Prior to Admission medications   Medication Sig Start Date End Date Taking? Authorizing Provider  apixaban (ELIQUIS) 5 MG TABS tablet Take 1 tablet (5 mg total) by mouth 2 (two) times daily. Patient taking differently: Take 5 mg by mouth 2 (two) times daily. Take 10 mg by mouth twice daily for 6 days, then stop 06/10/20   Shelly Coss, MD  calcium carbonate (OS-CAL) 1250 (500 Ca) MG chewable tablet Chew 1 tablet by mouth daily. Patient not taking: Reported on 10/10/2020    [provider]  Cholecalciferol 25 MCG (1000 UT) tablet Take 1,000 Units by mouth daily. Patient not taking: Reported on 10/10/2020    [provider]  furosemide (LASIX) 40 MG tablet Take 1 tablet (40 mg total) by mouth daily. 06/15/20 07/15/20  Wyvonnia Dusky, MD  metoprolol tartrate (LOPRESSOR) 25 MG tablet Take 0.5 tablets (12.5 mg total) by mouth 2 (two) times daily. 06/04/20   Shelly Coss, MD  Multiple Vitamins-Minerals (CENTRUM SILVER  50+WOMEN) TABS Take 1 tablet by mouth daily. Patient not taking: Reported on 10/10/2020    [provider]  pantoprazole (PROTONIX) 40 MG tablet Take 1 tablet (40 mg total) by mouth daily. Patient not taking: Reported on 10/10/2020 06/04/20   Shelly Coss, MD  potassium chloride (KLOR-CON) 10 MEQ tablet Take 1 tablet (10 mEq total) by mouth daily. 06/15/20 07/15/20  Wyvonnia Dusky, MD  spironolactone (ALDACTONE) 25 MG tablet Take 25 mg by mouth daily. Patient not taking: Reported on 10/10/2020 06/24/20   [provider]     Allergies Gabapentin, Ivp dye [iodinated diagnostic agents], and Metrizamide  Family History  Problem Relation Age of Onset  . Cancer Father        prostate  . Cancer Brother        prostate  . Breast cancer Neg Hx     Social History Social History   Tobacco Use  . Smoking status: Current Every Day Smoker    Packs/day: 0.50    Types: Cigarettes    Last attempt to quit: 04/28/2014    Years since quitting: 6.4  . Smokeless tobacco: Former Network engineer Use Topics  . Alcohol use: No    Alcohol/week: 0.0 standard drinks    Comment: occasional glass of wine  . Drug use: No    Review of Systems  Constitutional: No fever/chills Eyes: No visual changes.  ENT: No sore throat. Cardiovascular: Denies chest pain. Respiratory: Denies shortness of breath. Gastrointestinal: No abdominal pain. Genitourinary: Negative for dysuria. Musculoskeletal: As above Skin: Appears more consistent with bruising than Neurological: Negative for headaches or weakness   ____________________________________________   PHYSICAL EXAM:  VITAL SIGNS: ED Triage Vitals  Enc Vitals Group     BP 10/18/20 1143 (!) 119/105     Pulse Rate 10/18/20 1143 (!) 105     Resp 10/18/20 1143 18     Temp 10/18/20 1143 (!) 97.5 F (36.4 C)     Temp Source 10/18/20 1143 Oral     SpO2 10/18/20 1138 100 %     Weight 10/18/20 1140 76.8 kg (169 lb 6.4 oz)     Height  10/18/20 1140 1.727 m (5\' 8" )     Head Circumference --      Peak Flow --      Pain Score 10/18/20 1139 10     Pain Loc --      Pain Edu? --      Excl. in [place]? --     Constitutional: Alert and oriented. No acute distress.   Nose: No congestion/rhinnorhea. Mouth/Throat: Mucous membranes are moist.   Neck:  Painless ROM Cardiovascular: Normal rate, regular rhythm. Grossly normal heart sounds.  Good peripheral circulation. Respiratory: Normal respiratory effort.  No retractions. Lungs CTAB. Gastrointestinal: Soft and nontender. No distention.  No CVA tenderness.  Musculoskeletal: Left leg: Removed wrap, erythematous tender area, see image Less consistent with cellulitis especially given its rapid onset, more concerning for bruising discoloration, possibly related to wrapped leg, no ulcerations of the left lower  Neurologic:  Normal speech and language. No gross focal neurologic deficits are appreciated.  Skin:  Skin is warm, dry and intact.  Psychiatric: Mood and affect are normal. Speech and behavior are normal.  ____________________________________________   LABS (all labs ordered are listed, but only abnormal results are displayed)  Labs Reviewed  CBC - Abnormal; Notable for the following components:      Result Value   RDW 18.3 (*)    All other components within normal limits  COMPREHENSIVE METABOLIC PANEL - Abnormal; Notable for the following components:   CO2 19 (*)    BUN 75 (*)    Creatinine, Ser 2.21 (*)    Calcium 8.6 (*)    Total Protein 5.8 (*)    Albumin 3.0 (*)    AST 44 (*)    Alkaline Phosphatase 129 (*)    Total Bilirubin 1.6 (*)    GFR, Estimated 22 (*)    All other components within normal limits  LACTIC ACID, PLASMA - Abnormal; Notable for the following components:   Lactic Acid, Venous 3.3 (*)    All other components within normal limits  CULTURE, BLOOD (SINGLE)  LACTIC ACID, PLASMA   ____________________________________________  EKG  ED ECG  REPORT I, Lavonia Drafts, the attending physician, personally viewed and interpreted this ECG.   Date: 10/18/2020  Rhythm: Atrial fibrillation QRS Axis: normal Intervals: normal ST/T Wave abnormalities: normal Narrative Interpretation: no evidence of acute ischemia  ____________________________________________  RADIOLOGY  Ultrasound negative for DVT ____________________________________________   PROCEDURES  Procedure(s) performed: yes  Procedures   Angiocath insertion Performed by: Lavonia Drafts  Consent: Verbal consent obtained. Risks and benefits: risks, benefits and alternatives were discussed Time out: Immediately prior to procedure a "time out" was called to verify the correct patient, procedure, equipment, support staff and site/side marked as required.  Preparation: Patient was prepped and draped in the usual sterile fashion.  Vein Location: Left AC  Ultrasound Guided  Gauge: 18  Normal blood return and flush without difficulty Patient tolerance: Patient tolerated the procedure well with no immediate complications.     Critical Care performed: No ____________________________________________   INITIAL IMPRESSION / ASSESSMENT AND PLAN / ED COURSE  Pertinent labs & imaging results that were available during my care of the patient were reviewed by me and considered in my medical decision making (see chart for details).  Patient presents with discoloration to the left leg as above, good distal pulses, warm and well-perfused, mild tenderness in the area.  Discoloration seems suspicious for bruising as opposed to infection.  Afebrile.  The patient is on Eliquis for atrial fibrillation.  On her left upper arm she does have a similar area where she had a blood pressure cuff applied apparently.  Pending labs, patient was a very difficult stick  Lab work notable for normal white blood cell count however elevated lactic acid, given this we will treat with IV  Rocephin admit to the hospitalist service    ____________________________________________   FINAL CLINICAL IMPRESSION(S) / ED DIAGNOSES  Final diagnoses:  Cellulitis of left lower extremity        Note:  This document was prepared using Dragon voice recognition software and may include unintentional dictation errors.   Lavonia Drafts, MD 10/18/20 [phone]

## 2020-10-18 NOTE — ED Notes (Signed)
Lab unsuccessful at 2nd attempt for blood work.

## 2020-10-18 NOTE — Progress Notes (Signed)
CODE SEPSIS - PHARMACY COMMUNICATION  **Broad Spectrum Antibiotics should be administered within 1 hour of Sepsis diagnosis**  Time Code Sepsis Called/Page Received: 1441  Antibiotics Ordered: ceftriaxone  Time of 1st antibiotic administration: 1449  Additional action taken by pharmacy: none required  If necessary, Name of Provider/Nurse Contacted: N/A    Dallie Piles ,PharmD Clinical Pharmacist  10/18/2020  2:45 PM

## 2020-10-18 NOTE — Progress Notes (Signed)
Pt bilat LE washed with soap and water per orders. Pt was in pain during parts of this. Pt tolerated dressings for the most part though. Upper left inner leg noted to be weeping and abd pads placed with kerlex and mesh undies to help keep placement. R top of foot had a small wound that did not apear to be weeping, there was a blood blister appearing wound to the outer R ankle. Pt L leg was red mostly to the inner upper thigh and most of the lower part of the leg. Pt lower legs both have +3 edema and are very cool, DP pulses felt lightly. Cap refill also very light to be seen, but 3 seconds. Pt did have small wound to the back of the Left ankle.

## 2020-10-18 NOTE — H&P (Signed)
History and Physical    KYLEEN VILLATORO LDJ:[id number] DOB: 1936-02-26 DOA: 10/18/2020  PCP: Sallee Lange, NP   Patient coming from: Home  I have personally briefly reviewed patient's old medical records in [place]  Chief Complaint: Left leg redness/pain  HPI: [name] is a 85 y.o. female with medical history significant for breast cancer status post chemo and radiation therapy, nicotine dependence, chronic atrial fibrillation, chronic venous stasis who presents to the ER via EMS for evaluation of sudden onset left leg redness, swelling and differential warmth that started early this morning. Patient was seen by vascular surgery on 10/10/20 and was placed into Unna boots for her chronic venous stasis with recommendation to change weekly. Patient stated that she took off the wrap early this morning because she had a lot of drainage from both legs and took a shower. She states that she did not have the redness when she initially took of the wrap and that after her shower she wrapped up her legs and went back to bed. She woke up this morning with extensive redness, differential warmth and pain involving her left leg.  She was seen by the home health nurse who recommended she go to the emergency room for further evaluation. She denies having any fever or chills, she denies any recent trauma or falls, no chest pain, no shortness of breath, no dizziness, no lightheadedness, no nausea, no vomiting, no abdominal pain, no headache, no focal deficits. Labs show sodium 139, potassium 4.4, chloride 110, bicarb 19, glucose 98, BUN 75, creatinine 2.21, calcium 8.6, alkaline phosphatase 129, albumin 3.0, AST 44, ALT 30, total protein 5.8, total bilirubin 1.6, lactic acid 3.3, white count 8.5, hemoglobin 12.3, hematocrit 38.0, MCV 97.7, RDW 18.3, platelet count 243 Bilateral lower extremity ultrasound shows no evidence of deep venous thrombosis in either lower extremity. Twelve-lead EKG  reviewed by me shows A. Fib with RVR.    ED Course: Patient is an 85 year old female with a history of A. fib and chronic venous insufficiency who presents to the ER via EMS for evaluation of sudden onset, progressive redness and differential warmth involving her left lower extremity.  She is afebrile and has no leukocytosis.  She received a dose of Rocephin in the ER and will be admitted to the hospital for left leg cellulitis.    Review of Systems: As per HPI otherwise all other systems reviewed and negative.    Past Medical History:  Diagnosis Date  . Arthritis    lower back, knees  . Breast cancer ([place]) 2011   RT LUMPECTOMY  . Chemotherapy follow-up examination 2011   RT BREAST CANCER  . Cirrhosis ([place])   . Personal history of chemotherapy   . Personal history of radiation therapy   . Radiation 2011   RT BREAST CANCER  . Skin cancer   . Wears dentures    full upper, partial lower    Past Surgical History:  Procedure Laterality Date  . BREAST BIOPSY Right 2014   RT CORE W/CLIP - NEG  . BREAST EXCISIONAL BIOPSY Right 2011   breast ca  . CATARACT EXTRACTION W/PHACO Right 04/15/2016   Procedure: CATARACT EXTRACTION PHACO AND INTRAOCULAR LENS PLACEMENT (IOC);  Surgeon: Leandrew Koyanagi, MD;  Location: [facility];  Service: Ophthalmology;  Laterality: Right;  . CATARACT EXTRACTION W/PHACO Left 07/01/2016   Procedure: CATARACT EXTRACTION PHACO AND INTRAOCULAR LENS PLACEMENT (IOC);  Surgeon: Leandrew Koyanagi, MD;  Location: [facility];  Service: Ophthalmology;  Laterality: Left;  Left eye  . CHOLECYSTECTOMY       reports that she has been smoking cigarettes. She has been smoking about 0.50 packs per day. She has quit using smokeless tobacco. She reports that she does not drink alcohol and does not use drugs.  Allergies  Allergen Reactions  . Gabapentin Itching  . Ivp Dye [Iodinated Diagnostic Agents] Itching  . Metrizamide Itching    Family  History  Problem Relation Age of Onset  . Cancer Father        prostate  . Cancer Brother        prostate  . Breast cancer Neg Hx       Prior to Admission medications   Medication Sig Start Date End Date Taking? Authorizing Provider  apixaban (ELIQUIS) 5 MG TABS tablet Take 1 tablet (5 mg total) by mouth 2 (two) times daily. Patient taking differently: Take 5 mg by mouth 2 (two) times daily. Take 10 mg by mouth twice daily for 6 days, then stop 06/10/20   Shelly Coss, MD  calcium carbonate (OS-CAL) 1250 (500 Ca) MG chewable tablet Chew 1 tablet by mouth daily. Patient not taking: Reported on 10/10/2020    [provider]  Cholecalciferol 25 MCG (1000 UT) tablet Take 1,000 Units by mouth daily. Patient not taking: Reported on 10/10/2020    [provider]  furosemide (LASIX) 40 MG tablet Take 1 tablet (40 mg total) by mouth daily. 06/15/20 07/15/20  Wyvonnia Dusky, MD  metoprolol tartrate (LOPRESSOR) 25 MG tablet Take 0.5 tablets (12.5 mg total) by mouth 2 (two) times daily. 06/04/20   Shelly Coss, MD  Multiple Vitamins-Minerals (CENTRUM SILVER 50+WOMEN) TABS Take 1 tablet by mouth daily. Patient not taking: Reported on 10/10/2020    [provider]  pantoprazole (PROTONIX) 40 MG tablet Take 1 tablet (40 mg total) by mouth daily. Patient not taking: Reported on 10/10/2020 06/04/20   Shelly Coss, MD  potassium chloride (KLOR-CON) 10 MEQ tablet Take 1 tablet (10 mEq total) by mouth daily. 06/15/20 07/15/20  Wyvonnia Dusky, MD  spironolactone (ALDACTONE) 25 MG tablet Take 25 mg by mouth daily. Patient not taking: Reported on 10/10/2020 06/24/20   [provider]    Physical Exam: Vitals:   10/18/20 1252 10/18/20 1300 10/18/20 1409 10/18/20 1500  BP: (!) 92/49 106/66 112/75 107/66  Pulse: (!) 58 (!) 101 (!) 101 98  Resp: (!) 26 (!) 27 18 20   Temp:      TempSrc:      SpO2: 99% 97% 100% 98%  Weight:      Height:         Vitals:    10/18/20 1252 10/18/20 1300 10/18/20 1409 10/18/20 1500  BP: (!) 92/49 106/66 112/75 107/66  Pulse: (!) 58 (!) 101 (!) 101 98  Resp: (!) 26 (!) 27 18 20   Temp:      TempSrc:      SpO2: 99% 97% 100% 98%  Weight:      Height:          Constitutional: Alert and oriented x 3 . Not in any apparent distress HEENT:      Head: Normocephalic and atraumatic.         Eyes: PERLA, EOMI, Conjunctivae are normal. Sclera is non-icteric.       Mouth/Throat: Mucous membranes are moist.       Neck: Supple with no signs of meningismus. Cardiovascular:  Irregularly irregular. No murmurs, gallops, or rubs. 2+ symmetrical  distal pulses are present . No JVD. 2+ LE edema Respiratory: Respiratory effort normal .Lungs sounds clear bilaterally. No wheezes, crackles, or rhonchi.  Gastrointestinal: Soft, non tender, and non distended with positive bowel sounds.  Genitourinary: No CVA tenderness. Musculoskeletal: Nontender with normal range of motion in all extremities. No cyanosis, or erythema of extremities. Neurologic:  Face is symmetric. Moving all extremities. No gross focal neurologic deficits  Skin: Skin is warm, dry.  Redness involving both inner thigh of the left leg and extending to the mid left leg with differential warmth and tender to touch.  Unna boot over right leg Psychiatric: Mood and affect are normal   Labs on Admission: I have personally reviewed following labs and imaging studies  CBC: Recent Labs  Lab 10/18/20 1349  WBC 8.5  HGB 12.3  HCT 38.0  MCV 97.7  PLT 170   Basic Metabolic Panel: Recent Labs  Lab 10/18/20 1349  NA 139  K 4.4  CL 110  CO2 19*  GLUCOSE 98  BUN 75*  CREATININE 2.21*  CALCIUM 8.6*   GFR: Estimated Creatinine Clearance: 21 mL/min (A) (by C-G formula based on SCr of 2.21 mg/dL (H)). Liver Function Tests: Recent Labs  Lab 10/18/20 1349  AST 44*  ALT 30  ALKPHOS 129*  BILITOT 1.6*  PROT 5.8*  ALBUMIN 3.0*   No results for input(s): LIPASE,  AMYLASE in the last 168 hours. No results for input(s): AMMONIA in the last 168 hours. Coagulation Profile: No results for input(s): INR, PROTIME in the last 168 hours. Cardiac Enzymes: No results for input(s): CKTOTAL, CKMB, CKMBINDEX, TROPONINI in the last 168 hours. BNP (last 3 results) No results for input(s): PROBNP in the last 8760 hours. HbA1C: No results for input(s): HGBA1C in the last 72 hours. CBG: No results for input(s): GLUCAP in the last 168 hours. Lipid Profile: No results for input(s): CHOL, HDL, LDLCALC, TRIG, CHOLHDL, LDLDIRECT in the last 72 hours. Thyroid Function Tests: No results for input(s): TSH, T4TOTAL, FREET4, T3FREE, THYROIDAB in the last 72 hours. Anemia Panel: No results for input(s): VITAMINB12, FOLATE, FERRITIN, TIBC, IRON, RETICCTPCT in the last 72 hours. Urine analysis:    Component Value Date/Time   COLORURINE AMBER (A) 06/29/2020 1456   APPEARANCEUR CLOUDY (A) 06/29/2020 1456   APPEARANCEUR Cloudy (A) 11/28/2014 1456   LABSPEC 1.017 06/29/2020 1456   PHURINE 5.0 06/29/2020 1456   GLUCOSEU NEGATIVE 06/29/2020 1456   HGBUR LARGE (A) 06/29/2020 1456   BILIRUBINUR NEGATIVE 06/29/2020 1456   BILIRUBINUR Negative 11/28/2014 1456   KETONESUR NEGATIVE 06/29/2020 1456   PROTEINUR 100 (A) 06/29/2020 1456   NITRITE NEGATIVE 06/29/2020 1456   LEUKOCYTESUR NEGATIVE 06/29/2020 1456    Radiological Exams on Admission: US Venous Img Lower Bilateral (DVT)  Result Date: 10/18/2020 CLINICAL DATA:  Bilateral lower extremity pain and swelling EXAM: BILATERAL LOWER EXTREMITY VENOUS DOPPLER ULTRASOUND TECHNIQUE: Gray-scale sonography with graded compression, as well as color Doppler and duplex ultrasound were performed to evaluate the lower extremity deep venous systems from the level of the common femoral vein and including the common femoral, femoral, profunda femoral, popliteal and calf veins including the posterior tibial, peroneal and gastrocnemius veins when  visible. The superficial great saphenous vein was also interrogated. Spectral Doppler was utilized to evaluate flow at rest and with distal augmentation maneuvers in the common femoral, femoral and popliteal veins. COMPARISON:  None. FINDINGS: RIGHT LOWER EXTREMITY Common Femoral Vein: No evidence of thrombus. Normal compressibility, respiratory phasicity and response to augmentation. Saphenofemoral  Junction: No evidence of thrombus. Normal compressibility and flow on color Doppler imaging. Profunda Femoral Vein: No evidence of thrombus. Normal compressibility and flow on color Doppler imaging. Femoral Vein: No evidence of thrombus. Normal compressibility, respiratory phasicity and response to augmentation. Popliteal Vein: No evidence of thrombus. Normal compressibility, respiratory phasicity and response to augmentation. Calf Veins: No evidence of thrombus. Normal compressibility and flow on color Doppler imaging. Superficial Great Saphenous Vein: No evidence of thrombus. Normal compressibility. Venous Reflux:  None. Other Findings:  None. LEFT LOWER EXTREMITY Common Femoral Vein: No evidence of thrombus. Normal compressibility, respiratory phasicity and response to augmentation. Saphenofemoral Junction: No evidence of thrombus. Normal compressibility and flow on color Doppler imaging. Profunda Femoral Vein: No evidence of thrombus. Normal compressibility and flow on color Doppler imaging. Femoral Vein: No evidence of thrombus. Normal compressibility, respiratory phasicity and response to augmentation. Popliteal Vein: No evidence of thrombus. Normal compressibility, respiratory phasicity and response to augmentation. Calf Veins: No evidence of thrombus. Normal compressibility and flow on color Doppler imaging. Superficial Great Saphenous Vein: No evidence of thrombus. Normal compressibility. Venous Reflux:  None. Other Findings:  None. IMPRESSION: No evidence of deep venous thrombosis in either lower extremity.  Electronically Signed   By: Jacqulynn Cadet M.D.   On: 10/18/2020 13:55     Assessment/Plan Principal Problem:   Left leg cellulitis Active Problems:   Chronic a-fib (HCC)   NASH (nonalcoholic steatohepatitis)   Venous ulcer of ankle (HCC)   AKI (acute kidney injury) ([place])    Left leg cellulitis Concerning for possible Erysipelas We will place patient on IV Ancef adjusted to renal function Follow-up results of blood cultures Keep lower extremity elevated    Chronic venous stasis ulcers We will request wound care consult    Chronic A. Fib Patient is rate controlled Continue metoprolol with holding parameters Continue apixaban as primary prophylaxis for an acute stroke    History of liver cirrhosis Secondary to NASH Hold Lasix and spironolactone due to worsening renal function    Acute kidney injury At baseline patient has a serum creatinine of 0.9 and today on admission her creatinine is 2.1 with elevated BUN levels Hold Lasix and spironolactone Judicious IV fluid hydration Repeat renal parameters in a.m.    Lactic acidosis Unclear etiology with no evidence of sepsis at this time We will continue to monitor lactate levels   DVT prophylaxis: Apixaban Code Status: full code Family Communication: Greater than 50% of time was spent discussing patient's condition and plan of care with her and her husband at the bedside.  All questions and concerns have been addressed.  They verbalized understanding and agree with the plan. Disposition Plan: Back to previous home environment Consults called: Wound care Status: At the time of admission, it appears that the appropriate admission status for this patient is inpatient. This is judged to be reasonable and necessary in order to provide the required intensity of service to ensure the patient's safety given the presenting symptoms, physical exam findings, and initial radiographic and laboratory data in the context of  their comorbid conditions. Patient requires inpatient status due to high intensity of service, high risk evaluation and high frequency of surveillance required.    Collier Bullock MD [facility]     10/18/2020, 3:54 PM

## 2020-10-18 NOTE — Progress Notes (Signed)
Elink following Code Sepsis. 

## 2020-10-18 NOTE — ED Notes (Signed)
Lab unable to obtain bloodwork. IV team consult placed at this time.

## 2020-10-19 DIAGNOSIS — R6520 Severe sepsis without septic shock: Secondary | ICD-10-CM

## 2020-10-19 DIAGNOSIS — A419 Sepsis, unspecified organism: Principal | ICD-10-CM

## 2020-10-19 LAB — BASIC METABOLIC PANEL
Anion gap: 9 (ref 5–15)
BUN: 66 mg/dL — ABNORMAL HIGH (ref 8–23)
CO2: 17 mmol/L — ABNORMAL LOW (ref 22–32)
Calcium: 7.6 mg/dL — ABNORMAL LOW (ref 8.9–10.3)
Chloride: 110 mmol/L (ref 98–111)
Creatinine, Ser: 2.11 mg/dL — ABNORMAL HIGH (ref 0.44–1.00)
GFR, Estimated: 23 mL/min — ABNORMAL LOW (ref >60)
Glucose, Bld: 95 mg/dL (ref 70–99)
Potassium: 4.2 mmol/L (ref 3.5–5.1)
Sodium: 136 mmol/L (ref 135–145)

## 2020-10-19 LAB — BLOOD CULTURE ID PANEL (REFLEXED) - BCID2

## 2020-10-19 LAB — CBC
HCT: 31.9 % — ABNORMAL LOW (ref 36.0–46.0)
Hemoglobin: 10.5 g/dL — ABNORMAL LOW (ref 12.0–15.0)
MCH: 32.1 pg (ref 26.0–34.0)
MCHC: 32.9 g/dL (ref 30.0–36.0)
MCV: 97.6 fL (ref 80.0–100.0)
Platelets: 159 10*3/uL (ref 150–400)
RBC: 3.27 MIL/uL — ABNORMAL LOW (ref 3.87–5.11)
RDW: 18.3 % — ABNORMAL HIGH (ref 11.5–15.5)
WBC: 5.8 10*3/uL (ref 4.0–10.5)
nRBC: 0 % (ref 0.0–0.2)

## 2020-10-19 LAB — SARS CORONAVIRUS 2 (TAT 6-24 HRS): SARS Coronavirus 2: NEGATIVE

## 2020-10-19 LAB — LACTIC ACID, PLASMA: Lactic Acid, Venous: 3.4 mmol/L (ref 0.5–1.9)

## 2020-10-19 MED ORDER — SODIUM CHLORIDE 0.9 % IV SOLN
1.0000 g | Freq: Two times a day (BID) | INTRAVENOUS | Status: DC
  Filled 2020-10-19 (×2): qty 10

## 2020-10-19 MED ORDER — APIXABAN 2.5 MG PO TABS
2.5000 mg | ORAL_TABLET | Freq: Two times a day (BID) | ORAL | Status: DC
  Administered 2020-10-19 – 2020-10-22 (×7): 2.5 mg via ORAL
  Filled 2020-10-19 (×7): qty 1

## 2020-10-19 MED ORDER — PIPERACILLIN-TAZOBACTAM 3.375 G IVPB
3.3750 g | Freq: Three times a day (TID) | INTRAVENOUS | Status: DC
  Administered 2020-10-19 – 2020-10-21 (×6): 3.375 g via INTRAVENOUS
  Filled 2020-10-19 (×6): qty 50

## 2020-10-19 MED ORDER — ENSURE ENLIVE PO LIQD
237.0000 mL | Freq: Three times a day (TID) | ORAL | Status: DC
  Administered 2020-10-19 – 2020-10-22 (×6): 237 mL via ORAL

## 2020-10-19 MED ORDER — SODIUM CHLORIDE 0.45 % IV SOLN
INTRAVENOUS | Status: DC
  Filled 2020-10-19 (×6): qty 75

## 2020-10-19 MED ORDER — ADULT MULTIVITAMIN W/MINERALS CH
1.0000 | ORAL_TABLET | Freq: Every day | ORAL | Status: DC
  Administered 2020-10-19 – 2020-10-22 (×4): 1 via ORAL
  Filled 2020-10-19 (×4): qty 1

## 2020-10-19 NOTE — Consult Note (Signed)
VASCULAR SURGERY CONSULTATION   Requested by:  Dr. Francine Graven (Hospitalist)  Reason for consultation: left leg rash    History of Present Illness   [name] is a 85 y.o. (1936-03-15) female who presents with cc: left leg pain.  Pt was seen by Dr. Ronalee Belts on 10/10/20.  She was diagnosed with phlebolymphedema and started on LLE unna boot.  Pt started having increased pain in Left leg on Thursday, leading her to take off the unna boot.  She notes the red areas of her left leg are tender to touch.  She denies any drainage from the skin at this point..  Past Medical History:  Diagnosis Date  . Arthritis    lower back, knees  . Breast cancer ([place]) 2011   RT LUMPECTOMY  . Chemotherapy follow-up examination 2011   RT BREAST CANCER  . Cirrhosis ([place])   . Personal history of chemotherapy   . Personal history of radiation therapy   . Radiation 2011   RT BREAST CANCER  . Skin cancer   . Wears dentures    full upper, partial lower    Past Surgical History:  Procedure Laterality Date  . BREAST BIOPSY Right 2014   RT CORE W/CLIP - NEG  . BREAST EXCISIONAL BIOPSY Right 2011   breast ca  . CATARACT EXTRACTION W/PHACO Right 04/15/2016   Procedure: CATARACT EXTRACTION PHACO AND INTRAOCULAR LENS PLACEMENT (IOC);  Surgeon: Leandrew Koyanagi, MD;  Location: [facility];  Service: Ophthalmology;  Laterality: Right;  . CATARACT EXTRACTION W/PHACO Left 07/01/2016   Procedure: CATARACT EXTRACTION PHACO AND INTRAOCULAR LENS PLACEMENT (IOC);  Surgeon: Leandrew Koyanagi, MD;  Location: [facility];  Service: Ophthalmology;  Laterality: Left;  Left eye  . CHOLECYSTECTOMY       Social History   Socioeconomic History  . Marital status: Divorced    Spouse name: Not on file  . Number of children: Not on file  . Years of education: Not on file  . Highest education level: Not on file  Occupational History  . Not on file  Tobacco Use  . Smoking status: Current Every Day  Smoker    Packs/day: 0.50    Types: Cigarettes    Last attempt to quit: 04/28/2014    Years since quitting: 6.4  . Smokeless tobacco: Former Network engineer and Sexual Activity  . Alcohol use: No    Alcohol/week: 0.0 standard drinks    Comment: occasional glass of wine  . Drug use: No  . Sexual activity: Not on file  Other Topics Concern  . Not on file  Social History Narrative  . Not on file   Social Determinants of Health   Financial Resource Strain: Not on file  Food Insecurity: Not on file  Transportation Needs: Not on file  Physical Activity: Not on file  Stress: Not on file  Social Connections: Not on file  Intimate Partner Violence: Not on file    Family History  Problem Relation Age of Onset  . Cancer Father        prostate  . Cancer Brother        prostate  . Breast cancer Neg Hx     Current Facility-Administered Medications  Medication Dose Route Frequency Provider Last Rate Last Admin  . apixaban (ELIQUIS) tablet 2.5 mg  2.5 mg Oral BID Belue, Alver Sorrow, RPH      . lactated ringers infusion   Intravenous Continuous Agbata, Tochukwu, MD 125 mL/hr  at 10/19/20 0501 New Bag at 10/19/20 0501  . metoprolol tartrate (LOPRESSOR) tablet 12.5 mg  12.5 mg Oral BID Agbata, Tochukwu, MD   12.5 mg at 10/18/20 2220  . ondansetron (ZOFRAN) tablet 4 mg  4 mg Oral Q6H PRN Agbata, Tochukwu, MD       Or  . ondansetron (ZOFRAN) injection 4 mg  4 mg Intravenous Q6H PRN Agbata, Tochukwu, MD      . pantoprazole (PROTONIX) EC tablet 40 mg  40 mg Oral Daily Agbata, Tochukwu, MD   40 mg at 10/18/20 1553  . piperacillin-tazobactam (ZOSYN) IVPB 3.375 g  3.375 g Intravenous Q8H Sharen Hones, MD        Allergies  Allergen Reactions  . Gabapentin Itching  . Ivp Dye [Iodinated Diagnostic Agents] Itching  . Metrizamide Itching    REVIEW OF SYSTEMS (negative unless checked):   Cardiac:  []  Chest pain or chest pressure? []  Shortness of breath upon activity? []  Shortness of breath  when lying flat? []  Irregular heart rhythm?  Vascular:  []  Pain in calf, thigh, or hip brought on by walking? [x]  Pain in feet at night that wakes you up from your sleep? []  Blood clot in your veins? [x]  Leg swelling?  Pulmonary:  []  Oxygen at home? []  Productive cough? []  Wheezing?  Neurologic:  []  Sudden weakness in arms or legs? []  Sudden numbness in arms or legs? []  Sudden onset of difficult speaking or slurred speech? []  Temporary loss of vision in one eye? []  Problems with dizziness?  Gastrointestinal:  []  Blood in stool? []  Vomited blood?  Genitourinary:  []  Burning when urinating? []  Blood in urine?  Psychiatric:  []  Major depression  Hematologic:  []  Bleeding problems? []  Problems with blood clotting?  Dermatologic:  [x]  Rashes or ulcers?  Constitutional:  []  Fever or chills?  Ear/Nose/Throat:  []  Change in hearing? []  Nose bleeds? []  Sore throat?  Musculoskeletal:  []  Back pain? []  Joint pain? []  Muscle pain?   Physical Examination     Vitals:   10/18/20 2128 10/18/20 2339 10/19/20 0429 10/19/20 0839  BP: 125/88 120/78 111/68 109/73  Pulse: 93 99 82 94  Resp: 16 16 16 16   Temp: 97.8 F (36.6 C) 98.1 F (36.7 C) (!) 97.5 F (36.4 C) 97.6 F (36.4 C)  TempSrc:   Oral Oral  SpO2: 99% 98% 97% 97%  Weight:      Height:       Body mass index is 25.76 kg/m.  General Alert, O x 3, WD, NAD  Vascular Vessel Right Left  Radial Palpable Palpable  Brachial Palpable Palpable  Carotid Palpable, No Bruit Palpable, No Bruit  Aorta Not palpable N/A  Femoral Palpable Palpable  Popliteal Not palpable Not palpable  PT Palpable Palpable  DP Palpable Palpable    Musculo- skeletal M/S 5/5 throughout BUE, M/S 3-4/5 BLE  , RLE: stasis dermatitis, healing dorsum ulcer, 2 cm diameter, with granulation evident, ischemic skin patch more proximal and lateral without frank skin down, LLE: posterior, lateral ulcer, 2 cm diameter, with granulation,  extensive intense erythema extending from knee to upper thigh, TTP to touch, some serous drainage    Laboratory   CBC CBC Latest Ref Rng & Units 10/19/2020 10/18/2020 07/03/2020  WBC 4.0 - 10.5 K/uL 5.8 8.5 6.5  Hemoglobin 12.0 - 15.0 g/dL 10.5(L) 12.3 12.7  Hematocrit 36.0 - 46.0 % 31.9(L) 38.0 38.8  Platelets 150 - 400 K/uL 159 243 154    BMP BMP Latest Ref  Rng & Units 10/19/2020 10/18/2020 07/02/2020  Glucose 70 - 99 mg/dL 95 98 79  BUN 8 - 23 mg/dL 66(H) 75(H) 62(H)  Creatinine 0.44 - 1.00 mg/dL 2.11(H) 2.21(H) 1.69(H)  Sodium 135 - 145 mmol/L 136 139 135  Potassium 3.5 - 5.1 mmol/L 4.2 4.4 4.1  Chloride 98 - 111 mmol/L 110 110 101  CO2 22 - 32 mmol/L 17(L) 19(L) 22  Calcium 8.9 - 10.3 mg/dL 7.6(L) 8.6(L) 8.2(L)    Coagulation Lab Results  Component Value Date   INR 1.1 06/29/2020   INR 1.1 06/13/2020   INR 1.0 06/12/2020   No results found for: PTT  Lipids No results found for: CHOL, TRIG, HDL, CHOLHDL, VLDL, LDLCALC, LDLDIRECT   Radiology     US Venous Img Lower Bilateral (DVT)  Result Date: 10/18/2020 CLINICAL DATA:  Bilateral lower extremity pain and swelling EXAM: BILATERAL LOWER EXTREMITY VENOUS DOPPLER ULTRASOUND TECHNIQUE: Gray-scale sonography with graded compression, as well as color Doppler and duplex ultrasound were performed to evaluate the lower extremity deep venous systems from the level of the common femoral vein and including the common femoral, femoral, profunda femoral, popliteal and calf veins including the posterior tibial, peroneal and gastrocnemius veins when visible. The superficial great saphenous vein was also interrogated. Spectral Doppler was utilized to evaluate flow at rest and with distal augmentation maneuvers in the common femoral, femoral and popliteal veins. COMPARISON:  None. FINDINGS: RIGHT LOWER EXTREMITY Common Femoral Vein: No evidence of thrombus. Normal compressibility, respiratory phasicity and response to augmentation. Saphenofemoral  Junction: No evidence of thrombus. Normal compressibility and flow on color Doppler imaging. Profunda Femoral Vein: No evidence of thrombus. Normal compressibility and flow on color Doppler imaging. Femoral Vein: No evidence of thrombus. Normal compressibility, respiratory phasicity and response to augmentation. Popliteal Vein: No evidence of thrombus. Normal compressibility, respiratory phasicity and response to augmentation. Calf Veins: No evidence of thrombus. Normal compressibility and flow on color Doppler imaging. Superficial Great Saphenous Vein: No evidence of thrombus. Normal compressibility. Venous Reflux:  None. Other Findings:  None. LEFT LOWER EXTREMITY Common Femoral Vein: No evidence of thrombus. Normal compressibility, respiratory phasicity and response to augmentation. Saphenofemoral Junction: No evidence of thrombus. Normal compressibility and flow on color Doppler imaging. Profunda Femoral Vein: No evidence of thrombus. Normal compressibility and flow on color Doppler imaging. Femoral Vein: No evidence of thrombus. Normal compressibility, respiratory phasicity and response to augmentation. Popliteal Vein: No evidence of thrombus. Normal compressibility, respiratory phasicity and response to augmentation. Calf Veins: No evidence of thrombus. Normal compressibility and flow on color Doppler imaging. Superficial Great Saphenous Vein: No evidence of thrombus. Normal compressibility. Venous Reflux:  None. Other Findings:  None. IMPRESSION: No evidence of deep venous thrombosis in either lower extremity. Electronically Signed   By: Jacqulynn Cadet M.D.   On: 10/18/2020 13:55     Medical Decision Making   [name] is a 85 y.o. 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  Wrap legs with ACE bandage from foot to  above the knee as tolerated.  If increased drainage from left thigh, apply Aquacel Ag to any wounds.  Management of LLE cellulitis per Hospitalist team  Dr. Ronalee Belts will check on patient on Monday  Thank you for allowing [place] to participate in this patient's care.   Adele Barthel, MD, FACS, FSVS  Covering for [facility]: [phone]  10/19/2020, 8:56 AM

## 2020-10-19 NOTE — Plan of Care (Signed)
  Problem: Education: Goal: Knowledge of General Education information will improve Description: Including pain rating scale, medication(s)/side effects and non-pharmacologic comfort measures Outcome: Progressing   Problem: Clinical Measurements: Goal: Will remain free from infection Outcome: Progressing   Problem: Activity: Goal: Risk for activity intolerance will decrease Outcome: Progressing   Problem: Nutrition: Goal: Adequate nutrition will be maintained Outcome: Progressing   Problem: Coping: Goal: Level of anxiety will decrease Outcome: Progressing   Problem: Pain Managment: Goal: General experience of comfort will improve Outcome: Progressing   Problem: Safety: Goal: Ability to remain free from injury will improve Outcome: Progressing   Problem: Skin Integrity: Goal: Risk for impaired skin integrity will decrease Outcome: Progressing   

## 2020-10-19 NOTE — Progress Notes (Signed)
PROGRESS NOTE    [name]  CVE:[id number] DOB: 1935-07-20 DOA: 10/18/2020 PCP: Sallee Lange, NP   Chief complaint left leg Redness and pain. Brief Narrative:  [name] is a 85 y.o. female with medical history significant for breast cancer status post chemo and radiation therapy, nicotine dependence, chronic atrial fibrillation, chronic venous stasis who presents to the ER via EMS for evaluation of sudden onset left leg redness, swelling and differential warmth that started early morning 5/20. She initially placed on cefazolin for cellulitis.  Blood cultures on 5/21 positive for gram-negative rods, antibiotic changed to Zosyn.    Assessment & Plan:   Principal Problem:   Left leg cellulitis Active Problems:   Severe sepsis (HCC)   Chronic a-fib (HCC)   NASH (nonalcoholic steatohepatitis)   Venous ulcer of ankle ([place])   AKI (acute kidney injury) ([place])  #1.  Severe sepsis with septic shock secondary to cellulitis. Acute kidney injury on stage IV chronic kidney disease secondary to sepsis. Metabolic acidosis. Lactic acidosis. Patient has significant cellulitis on the left leg, I carefully examined her leg today, there is no subcutaneous air, based on picture present yesterday, the cellulitis today is not worse.  No evidence of necrotizing fasciitis. Her blood culture came back with gram-negative rods, I will change antibiotic to Zosyn.  Pending final results. I reviewed the previous vital signs and labs, patient does meet severe sepsis with septic shock.  She had a tachycardia, tachypnea, acute kidney injury as well as a lactic acid at 4.5.  He received fluids initially, she is currently hemodynamically stable, lactic acid gradually coming down. She does not need additional fluid bolus at this time. I also reviewed the patient previous labs, the best creatinine level was 1.69 on 07/02/2020 with GFR 30.  She has a chronic kidney disease stage IV at the least. I will  continue IV fluids, but change bicarbonate drip.  #2.  Chronic atrial fibrillation. Continue Eliquis.  3.  Liver cirrhosis secondary to [place]. Due to severe sepsis, diuretics on hold.  DVT prophylaxis: Eliquis Code Status: Full Family Communication: Called son, not able to reach him. Disposition Plan:  .   Status is: Inpatient  Remains inpatient appropriate because:Inpatient level of care appropriate due to severity of illness   Dispo: The patient is from: Home              Anticipated d/c is to: Home              Patient currently is not medically stable to d/c.   Difficult to place patient No        I/O last 3 completed shifts: In: 599.8 [IV Piggyback:599.8] Out: 75 [Urine:50] Total I/O In: 120 [P.O.:120] Out: -      Consultants:     Procedures: none  Antimicrobials: Zosyn.  Subjective: Patient feels tired today, otherwise she denies any fever or chills. No short of breath or cough. She is still complaining of pain in her left leg. Denies any abdominal pain or nausea vomiting. No dysuria hematuria No chest pain or palpitation.  Objective: Vitals:   10/18/20 2128 10/18/20 2339 10/19/20 0429 10/19/20 0839  BP: 125/88 120/78 111/68 109/73  Pulse: 93 99 82 94  Resp: 16 16 16 16   Temp: 97.8 F (36.6 C) 98.1 F (36.7 C) (!) 97.5 F (36.4 C) 97.6 F (36.4 C)  TempSrc:   Oral Oral  SpO2: 99% 98% 97% 97%  Weight:      Height:  Intake/Output Summary (Last 24 hours) at 10/19/2020 1106 Last data filed at 10/19/2020 1051 Gross per 24 hour  Intake 719.82 ml  Output 50 ml  Net 669.82 ml   Filed Weights   10/18/20 1140  Weight: 76.8 kg    Examination:  General exam: Appears calm and comfortable  Respiratory system: Clear to auscultation. Respiratory effort normal. Cardiovascular system: S1 & S2 heard, RRR. No JVD, murmurs, rubs, gallops or clicks. No pedal edema. Gastrointestinal system: Abdomen is nondistended, soft and nontender. No  organomegaly or masses felt. Normal bowel sounds heard. Central nervous system: Alert and oriented. No focal neurological deficits. Extremities: Left leg red, mild swelling, tender to touch. Skin: No rashes, lesions or ulcers Psychiatry:  Mood & affect appropriate.     Data Reviewed: I have personally reviewed following labs and imaging studies  CBC: Recent Labs  Lab 10/18/20 1349 10/19/20 0151  WBC 8.5 5.8  HGB 12.3 10.5*  HCT 38.0 31.9*  MCV 97.7 97.6  PLT 243 308   Basic Metabolic Panel: Recent Labs  Lab 10/18/20 1349 10/19/20 0151  NA 139 136  K 4.4 4.2  CL 110 110  CO2 19* 17*  GLUCOSE 98 95  BUN 75* 66*  CREATININE 2.21* 2.11*  CALCIUM 8.6* 7.6*   GFR: Estimated Creatinine Clearance: 22 mL/min (A) (by C-G formula based on SCr of 2.11 mg/dL (H)). Liver Function Tests: Recent Labs  Lab 10/18/20 1349  AST 44*  ALT 30  ALKPHOS 129*  BILITOT 1.6*  PROT 5.8*  ALBUMIN 3.0*   No results for input(s): LIPASE, AMYLASE in the last 168 hours. No results for input(s): AMMONIA in the last 168 hours. Coagulation Profile: No results for input(s): INR, PROTIME in the last 168 hours. Cardiac Enzymes: No results for input(s): CKTOTAL, CKMB, CKMBINDEX, TROPONINI in the last 168 hours. BNP (last 3 results) No results for input(s): PROBNP in the last 8760 hours. HbA1C: No results for input(s): HGBA1C in the last 72 hours. CBG: No results for input(s): GLUCAP in the last 168 hours. Lipid Profile: No results for input(s): CHOL, HDL, LDLCALC, TRIG, CHOLHDL, LDLDIRECT in the last 72 hours. Thyroid Function Tests: No results for input(s): TSH, T4TOTAL, FREET4, T3FREE, THYROIDAB in the last 72 hours. Anemia Panel: No results for input(s): VITAMINB12, FOLATE, FERRITIN, TIBC, IRON, RETICCTPCT in the last 72 hours. Sepsis Labs: Recent Labs  Lab 10/18/20 1349 10/18/20 1927 10/18/20 2232 10/19/20 0151  LATICACIDVEN 3.3* 4.5* 3.9* 3.4*    Recent Results (from the past  240 hour(s))  Blood culture (single)     Status: None (Preliminary result)   Collection Time: 10/18/20  1:49 PM   Specimen: BLOOD  Result Value Ref Range Status   Specimen Description BLOOD BLOOD LEFT ARM  Final   Special Requests   Final    BOTTLES DRAWN AEROBIC AND ANAEROBIC Blood Culture results may not be optimal due to an inadequate volume of blood received in culture bottles   Culture  Setup Time   Final    Organism ID to follow AEROBIC BOTTLE ONLY GRAM NEGATIVE RODS CRITICAL RESULT CALLED TO, READ BACK BY AND VERIFIED WITH: ALEX CHAPPELL AT [facility] ON 10/19/2020 [place]. Performed at [facility], [address] [place], [place] [address]    Culture GRAM NEGATIVE RODS  Final   Report Status PENDING  Incomplete  Blood Culture ID Panel (Reflexed)     Status: None   Collection Time: 10/18/20  1:49 PM  Result Value Ref Range Status  Enterococcus faecalis NOT DETECTED NOT DETECTED Final   Enterococcus Faecium NOT DETECTED NOT DETECTED Final   Listeria monocytogenes NOT DETECTED NOT DETECTED Final   Staphylococcus species NOT DETECTED NOT DETECTED Final   Staphylococcus aureus (BCID) NOT DETECTED NOT DETECTED Final   Staphylococcus epidermidis NOT DETECTED NOT DETECTED Final   Staphylococcus lugdunensis NOT DETECTED NOT DETECTED Final   Streptococcus species NOT DETECTED NOT DETECTED Final   Streptococcus agalactiae NOT DETECTED NOT DETECTED Final   Streptococcus pneumoniae NOT DETECTED NOT DETECTED Final   Streptococcus pyogenes NOT DETECTED NOT DETECTED Final   A.calcoaceticus-baumannii NOT DETECTED NOT DETECTED Final   Bacteroides fragilis NOT DETECTED NOT DETECTED Final   Enterobacterales NOT DETECTED NOT DETECTED Final   Enterobacter cloacae complex NOT DETECTED NOT DETECTED Final   Escherichia coli NOT DETECTED NOT DETECTED Final   Klebsiella aerogenes NOT DETECTED NOT DETECTED Final   Klebsiella oxytoca NOT DETECTED NOT DETECTED Final   Klebsiella pneumoniae NOT  DETECTED NOT DETECTED Final   Proteus species NOT DETECTED NOT DETECTED Final   Salmonella species NOT DETECTED NOT DETECTED Final   Serratia marcescens NOT DETECTED NOT DETECTED Final   Haemophilus influenzae NOT DETECTED NOT DETECTED Final   Neisseria meningitidis NOT DETECTED NOT DETECTED Final   Pseudomonas aeruginosa NOT DETECTED NOT DETECTED Final   Stenotrophomonas maltophilia NOT DETECTED NOT DETECTED Final   Candida albicans NOT DETECTED NOT DETECTED Final   Candida auris NOT DETECTED NOT DETECTED Final   Candida glabrata NOT DETECTED NOT DETECTED Final   Candida krusei NOT DETECTED NOT DETECTED Final   Candida parapsilosis NOT DETECTED NOT DETECTED Final   Candida tropicalis NOT DETECTED NOT DETECTED Final   Cryptococcus neoformans/gattii NOT DETECTED NOT DETECTED Final    Comment: Performed at [facility], [address] [place], [place] [address]  SARS CORONAVIRUS 2 (TAT 6-24 HRS) Nasopharyngeal Nasopharyngeal Swab     Status: None   Collection Time: 10/18/20  3:30 PM   Specimen: Nasopharyngeal Swab  Result Value Ref Range Status   SARS Coronavirus 2 NEGATIVE NEGATIVE Final    Comment: (NOTE) SARS-CoV-2 target nucleic acids are NOT DETECTED.  The SARS-CoV-2 RNA is generally detectable in upper and lower respiratory specimens during the acute phase of infection. Negative results do not preclude SARS-CoV-2 infection, do not rule out co-infections with other pathogens, and should not be used as the sole basis for treatment or other patient management decisions. Negative results must be combined with clinical observations, patient history, and epidemiological information. The expected result is Negative.  Fact Sheet for Patients: SugarRoll.be  Fact Sheet for Healthcare Providers: https://www.woods-mathews.com/  This test is not yet approved or cleared by the [place] FDA and  has been authorized for detection  and/or diagnosis of SARS-CoV-2 by FDA under an Emergency Use Authorization (EUA). This EUA will remain  in effect (meaning this test can be used) for the duration of the COVID-19 declaration under Se ction 564(b)(1) of the Act, 21 U.S.C. section 360bbb-3(b)(1), unless the authorization is terminated or revoked sooner.  Performed at [facility], [place] [address] [place],  [address]          Radiology Studies: US Venous Img Lower Bilateral (DVT)  Result Date: 10/18/2020 CLINICAL DATA:  Bilateral lower extremity pain and swelling EXAM: BILATERAL LOWER EXTREMITY VENOUS DOPPLER ULTRASOUND TECHNIQUE: Gray-scale sonography with graded compression, as well as color Doppler and duplex ultrasound were performed to evaluate the lower extremity deep venous systems from the level of  the common femoral vein and including the common femoral, femoral, profunda femoral, popliteal and calf veins including the posterior tibial, peroneal and gastrocnemius veins when visible. The superficial great saphenous vein was also interrogated. Spectral Doppler was utilized to evaluate flow at rest and with distal augmentation maneuvers in the common femoral, femoral and popliteal veins. COMPARISON:  None. FINDINGS: RIGHT LOWER EXTREMITY Common Femoral Vein: No evidence of thrombus. Normal compressibility, respiratory phasicity and response to augmentation. Saphenofemoral Junction: No evidence of thrombus. Normal compressibility and flow on color Doppler imaging. Profunda Femoral Vein: No evidence of thrombus. Normal compressibility and flow on color Doppler imaging. Femoral Vein: No evidence of thrombus. Normal compressibility, respiratory phasicity and response to augmentation. Popliteal Vein: No evidence of thrombus. Normal compressibility, respiratory phasicity and response to augmentation. Calf Veins: No evidence of thrombus. Normal compressibility and flow on color Doppler imaging. Superficial Great Saphenous  Vein: No evidence of thrombus. Normal compressibility. Venous Reflux:  None. Other Findings:  None. LEFT LOWER EXTREMITY Common Femoral Vein: No evidence of thrombus. Normal compressibility, respiratory phasicity and response to augmentation. Saphenofemoral Junction: No evidence of thrombus. Normal compressibility and flow on color Doppler imaging. Profunda Femoral Vein: No evidence of thrombus. Normal compressibility and flow on color Doppler imaging. Femoral Vein: No evidence of thrombus. Normal compressibility, respiratory phasicity and response to augmentation. Popliteal Vein: No evidence of thrombus. Normal compressibility, respiratory phasicity and response to augmentation. Calf Veins: No evidence of thrombus. Normal compressibility and flow on color Doppler imaging. Superficial Great Saphenous Vein: No evidence of thrombus. Normal compressibility. Venous Reflux:  None. Other Findings:  None. IMPRESSION: No evidence of deep venous thrombosis in either lower extremity. Electronically Signed   By: Jacqulynn Cadet M.D.   On: 10/18/2020 13:55        Scheduled Meds: . apixaban  2.5 mg Oral BID  . metoprolol tartrate  12.5 mg Oral BID  . pantoprazole  40 mg Oral Daily   Continuous Infusions: . lactated ringers 125 mL/hr at 10/19/20 0501  . piperacillin-tazobactam (ZOSYN)  IV       LOS: 1 day    Time spent: 32 minutes, more than 50% time involving direct patient care.    Sharen Hones, MD [facility]   To contact the attending provider between 7A-7P or the covering provider during after hours 7P-7A, please log into the web site [email or web address] and access using universal [place] password for that web site. If you do not have the password, please call the hospital operator.  10/19/2020, 11:06 AM

## 2020-10-19 NOTE — Progress Notes (Signed)
Initial Nutrition Assessment  DOCUMENTATION CODES:   Not applicable  INTERVENTION:   -Ensure Enlive po TID, each supplement provides 350 kcal and 20 grams of protein -MVI with minerals daily  NUTRITION DIAGNOSIS:   Increased nutrient needs related to wound healing as evidenced by estimated needs.  GOAL:   Patient will meet greater than or equal to 90% of their needs  MONITOR:   PO intake,Supplement acceptance,Labs,Weight trends,Skin,I & O's  REASON FOR ASSESSMENT:   Malnutrition Screening Tool    ASSESSMENT:   [name] is a 85 y.o. female with medical history significant for breast cancer status post chemo and radiation therapy, nicotine dependence, chronic atrial fibrillation, chronic venous stasis who presents to the ER via EMS for evaluation of sudden onset left leg redness, swelling and differential warmth that started early this morning.  Pt admitted with lt leg cellulitis.   Reviewed I/O's: +550 ml x 24 hours  UOP: 50 ml x 24 hours  Pt unavailable at time of visit. Attempted to speak with pt via call to hospital room phone, however, unable to reach. RD unable to obtain further nutrition-related history or complete nutrition-focused physical exam at this time.   Per [facility] notes, pt with chronic venous stasis ulcers to BLEs. Per vascular surgery notes, no surgical debridement needed at this time.   Pt with poor oral intake. Noted meal completions 0-50%.   Reviewed wt hx; pt has experienced a 8.8% wt loss over the past 4 months, While not significant for time frame, it is concerning given advanced age and multiple co-morbidities.   Pt at high risk for malnutrition, however, unable to identify at this time. Pt would greatly benefit from addition of oral nutrition supplements.   Medications reviewed and include zosyn.   Labs reviewed.   Diet Order:   Diet Order            Diet 2 gram sodium Room service appropriate? Yes; Fluid consistency: Thin  Diet  effective now                 EDUCATION NEEDS:   Education needs have been addressed  Skin:  Skin Assessment: Skin Integrity Issues: Skin Integrity Issues:: Stage II,Other (Comment) Stage II: sacrum Other: venous stasis ulcers to BLEs  Last BM:  10/18/20  Height:   Ht Readings from Last 1 Encounters:  10/18/20 5\' 8"  (1.727 m)    Weight:   Wt Readings from Last 1 Encounters:  10/18/20 76.8 kg    Ideal Body Weight:  63.6 kg  BMI:  Body mass index is 25.76 kg/m.  Estimated Nutritional Needs:   Kcal:  1900-2100  Protein:  100-115 grams  Fluid:  > 1.9 L    [name], RD, LDN, [place] Registered Dietitian II Certified Diabetes Care and Education Specialist Please refer to [facility] for RD and/or RD on-call/weekend/after hours pager

## 2020-10-19 NOTE — Progress Notes (Signed)
PHARMACY - PHYSICIAN COMMUNICATION CRITICAL VALUE ALERT - BLOOD CULTURE IDENTIFICATION (BCID)  [name] is an 85 y.o. female who presented to [facility] on 10/18/2020 with a chief complaint of cellulitis  Assessment: 1/2 bottles GNR. Suspect skin source. BCID was unable to identify species.   Name of physician (or Provider) Contacted: Dr. Roosevelt Locks  Current antibiotics: Cefazolin  Changes to prescribed antibiotics recommended:  Broaden to [place] pending organism identification  Results for orders placed or performed during the hospital encounter of 10/18/20  Blood Culture ID Panel (Reflexed) (Collected: 10/18/2020  1:49 PM)  Result Value Ref Range   Enterococcus faecalis NOT DETECTED NOT DETECTED   Enterococcus Faecium NOT DETECTED NOT DETECTED   Listeria monocytogenes NOT DETECTED NOT DETECTED   Staphylococcus species NOT DETECTED NOT DETECTED   Staphylococcus aureus (BCID) NOT DETECTED NOT DETECTED   Staphylococcus epidermidis NOT DETECTED NOT DETECTED   Staphylococcus lugdunensis NOT DETECTED NOT DETECTED   Streptococcus species NOT DETECTED NOT DETECTED   Streptococcus agalactiae NOT DETECTED NOT DETECTED   Streptococcus pneumoniae NOT DETECTED NOT DETECTED   Streptococcus pyogenes NOT DETECTED NOT DETECTED   A.calcoaceticus-baumannii NOT DETECTED NOT DETECTED   Bacteroides fragilis NOT DETECTED NOT DETECTED   Enterobacterales NOT DETECTED NOT DETECTED   Enterobacter cloacae complex NOT DETECTED NOT DETECTED   Escherichia coli NOT DETECTED NOT DETECTED   Klebsiella aerogenes NOT DETECTED NOT DETECTED   Klebsiella oxytoca NOT DETECTED NOT DETECTED   Klebsiella pneumoniae NOT DETECTED NOT DETECTED   Proteus species NOT DETECTED NOT DETECTED   Salmonella species NOT DETECTED NOT DETECTED   Serratia marcescens NOT DETECTED NOT DETECTED   Haemophilus influenzae NOT DETECTED NOT DETECTED   Neisseria meningitidis NOT DETECTED NOT DETECTED   Pseudomonas aeruginosa NOT DETECTED  NOT DETECTED   Stenotrophomonas maltophilia NOT DETECTED NOT DETECTED   Candida albicans NOT DETECTED NOT DETECTED   Candida auris NOT DETECTED NOT DETECTED   Candida glabrata NOT DETECTED NOT DETECTED   Candida krusei NOT DETECTED NOT DETECTED   Candida parapsilosis NOT DETECTED NOT DETECTED   Candida tropicalis NOT DETECTED NOT DETECTED   Cryptococcus neoformans/gattii NOT DETECTED NOT DETECTED    Benita Gutter 10/19/2020  8:40 AM

## 2020-10-19 NOTE — Evaluation (Signed)
Physical Therapy Evaluation Patient Details Name: [name] MRN: [id number] DOB: 1935/09/12 Today's Date: 10/19/2020   History of Present Illness  [name] is a 85 y.o. female with medical history significant for breast cancer status post chemo and radiation therapy, nicotine dependence, chronic atrial fibrillation, chronic venous stasis who presents to the ER via EMS for evaluation of sudden onset left leg redness, swelling and differential warmth that started early this morning.  Patient was seen by vascular surgery on 10/10/20 and was placed into [place]na boots for her chronic venous stasis with recommendation to change weekly.  Patient stated that she took off the wrap early this morning because she had a lot of drainage from both legs and took a shower.  She states that she did not have the redness when she initially took of the wrap and that after her shower she wrapped up her legs and went back to bed.  She woke up this morning with extensive redness, differential warmth and pain involving her left leg.  She was seen by the home health nurse who recommended she go to the emergency room for further evaluation.    Clinical Impression  Patient received in bed, states she would love to get out of bed. Reports pain weakness in left LE. Patient's left leg is very red and weeping. Requires min assist to move leg in bed and min assist to raise trunk to seated position. Patient is able to stand with min assist with cues for scooting to edge of bed. She is able to take a few steps to recliner but declines further ambulation due to pain. Patient requested to use bathroom, which she was assisted with during session. Patient will continue to benefit from skilled PT while here to improve functional independence, safety and strength.         Follow Up Recommendations SNF    Equipment Recommendations  None recommended by PT    Recommendations for Other Services       Precautions / Restrictions  Precautions Precautions: Fall Restrictions Weight Bearing Restrictions: No      Mobility  Bed Mobility Overal bed mobility: Needs Assistance Bed Mobility: Supine to Sit     Supine to sit: HOB elevated;Min assist;Mod assist     General bed mobility comments: min assist to move left LE in bed due to pain, min/mod assist to raise trunk to seated position    Transfers Overall transfer level: Needs assistance Equipment used: Rolling walker (2 wheeled) Transfers: Sit to/from Stand Sit to Stand: Min assist         General transfer comment: requires cues to scoot to edge of bed/chair, min assist to power up  Ambulation/Gait Ambulation/Gait assistance: Min guard Gait Distance (Feet): 3 Feet Assistive device: Rolling walker (2 wheeled)   Gait velocity: decr   General Gait Details: patient is generally steady with standing and walking a few feet. Limited by pain, legs weeping and dripping on floor.  Stairs            Wheelchair Mobility    Modified Rankin (Stroke Patients Only)       Balance Overall balance assessment: Needs assistance Sitting-balance support: Feet supported Sitting balance-Leahy Scale: Good     Standing balance support: Bilateral upper extremity supported;During functional activity Standing balance-Leahy Scale: Fair Standing balance comment: reliant on RW and min guard for safety  Pertinent Vitals/Pain Pain Assessment: Faces Faces Pain Scale: Hurts even more Pain Location: L LE Pain Descriptors / Indicators: Discomfort;Tightness;Heaviness;Guarding;Grimacing Pain Intervention(s): Limited activity within patient's tolerance;Monitored during session;Repositioned    Home Living Family/patient expects to be discharged to:: Private residence Living Arrangements: Non-relatives/Friends Available Help at Discharge: Available 24 hours/day;Friend(s) Type of Home: Mobile home Home Access: Stairs to enter    Technical brewer of Steps: 1 Home Layout: One level Home Equipment: None;Walker - 2 wheels Additional Comments: Patient states she was using walker prior to admission    Prior Function Level of Independence: Needs assistance   Gait / Transfers Assistance Needed: ambulates with walker  ADL's / Homemaking Assistance Needed: ex husband cooks meals        Hand Dominance        Extremity/Trunk Assessment   Upper Extremity Assessment Upper Extremity Assessment: Generalized weakness    Lower Extremity Assessment Lower Extremity Assessment: Generalized weakness;LLE deficits/detail LLE Deficits / Details: leg weeping and very red almost purple. LLE: Unable to fully assess due to pain LLE Coordination: decreased gross motor    Cervical / Trunk Assessment Cervical / Trunk Assessment: Normal  Communication   Communication: No difficulties  Cognition Arousal/Alertness: Awake/alert Behavior During Therapy: WFL for tasks assessed/performed Overall Cognitive Status: Within Functional Limits for tasks assessed                                        General Comments      Exercises     Assessment/Plan    PT Assessment Patient needs continued PT services  PT Problem List Decreased strength;Decreased mobility;Decreased activity tolerance;Pain       PT Treatment Interventions Therapeutic exercise;Gait training;Stair training;Functional mobility training;Therapeutic activities;Patient/family education    PT Goals (Current goals can be found in the Care Plan section)  Acute Rehab PT Goals Patient Stated Goal: to decrease pain, get legs better PT Goal Formulation: With patient Time For Goal Achievement: 11/01/20 Potential to Achieve Goals: Fair    Frequency Min 2X/week   Barriers to discharge Decreased caregiver support      Co-evaluation               AM-PAC PT "6 Clicks" Mobility  Outcome Measure Help needed turning from your back to  your side while in a flat bed without using bedrails?: A Little Help needed moving from lying on your back to sitting on the side of a flat bed without using bedrails?: A Lot Help needed moving to and from a bed to a chair (including a wheelchair)?: A Little Help needed standing up from a chair using your arms (e.g., wheelchair or bedside chair)?: A Little Help needed to walk in hospital room?: A Lot Help needed climbing 3-5 steps with a railing? : A Lot 6 Click Score: 15    End of Session Equipment Utilized During Treatment: Gait belt Activity Tolerance: Patient limited by pain Patient left: in chair;with call bell/phone within reach;with chair alarm set Nurse Communication: Mobility status PT Visit Diagnosis: Other abnormalities of gait and mobility (R26.89);Muscle weakness (generalized) (M62.81);Pain;Difficulty in walking, not elsewhere classified (R26.2) Pain - Right/Left: Left Pain - part of body: Leg    Time: 4696-2952 PT Time Calculation (min) (ACUTE ONLY): 46 min   Charges:   PT Evaluation $PT Eval Moderate Complexity: 1 Mod PT Treatments $Therapeutic Activity: 8-22 mins        Amanda Cockayne,  PT, GCS 10/19/20,12:06 PM

## 2020-10-19 NOTE — Plan of Care (Signed)
  Problem: Education: Goal: Knowledge of General Education information will improve Description: Including pain rating scale, medication(s)/side effects and non-pharmacologic comfort measures Outcome: Progressing   Problem: Clinical Measurements: Goal: Ability to maintain clinical measurements within normal limits will improve Outcome: Progressing   Problem: Clinical Measurements: Goal: Diagnostic test results will improve Outcome: Progressing   Problem: Clinical Measurements: Goal: Cardiovascular complication will be avoided Outcome: Progressing   Problem: Activity: Goal: Risk for activity intolerance will decrease Outcome: Progressing   Problem: Nutrition: Goal: Adequate nutrition will be maintained Outcome: Progressing   Problem: Pain Managment: Goal: General experience of comfort will improve Outcome: Progressing   Problem: Safety: Goal: Ability to remain free from injury will improve Outcome: Progressing   Problem: Skin Integrity: Goal: Risk for impaired skin integrity will decrease Outcome: Progressing

## 2020-10-20 ENCOUNTER — Encounter (INDEPENDENT_AMBULATORY_CARE_PROVIDER_SITE_OTHER): Payer: Self-pay | Admitting: Nurse Practitioner

## 2020-10-20 LAB — CBC WITH DIFFERENTIAL/PLATELET
Abs Immature Granulocytes: 0.05 10*3/uL (ref 0.00–0.07)
Basophils Absolute: 0 10*3/uL (ref 0.0–0.1)
Basophils Relative: 0 %
Eosinophils Absolute: 0.1 10*3/uL (ref 0.0–0.5)
Eosinophils Relative: 1 %
HCT: 35.9 % — ABNORMAL LOW (ref 36.0–46.0)
Hemoglobin: 11.7 g/dL — ABNORMAL LOW (ref 12.0–15.0)
Immature Granulocytes: 1 %
Lymphocytes Relative: 15 %
Lymphs Abs: 1.4 10*3/uL (ref 0.7–4.0)
MCH: 32.1 pg (ref 26.0–34.0)
MCHC: 32.6 g/dL (ref 30.0–36.0)
MCV: 98.4 fL (ref 80.0–100.0)
Monocytes Absolute: 0.8 10*3/uL (ref 0.1–1.0)
Monocytes Relative: 8 %
Neutro Abs: 6.9 10*3/uL (ref 1.7–7.7)
Neutrophils Relative %: 75 %
Platelets: 176 10*3/uL (ref 150–400)
RBC: 3.65 MIL/uL — ABNORMAL LOW (ref 3.87–5.11)
RDW: 18.6 % — ABNORMAL HIGH (ref 11.5–15.5)
WBC: 9.1 10*3/uL (ref 4.0–10.5)
nRBC: 0 % (ref 0.0–0.2)

## 2020-10-20 LAB — BASIC METABOLIC PANEL
Anion gap: 10 (ref 5–15)
BUN: 62 mg/dL — ABNORMAL HIGH (ref 8–23)
CO2: 19 mmol/L — ABNORMAL LOW (ref 22–32)
Calcium: 7.7 mg/dL — ABNORMAL LOW (ref 8.9–10.3)
Chloride: 106 mmol/L (ref 98–111)
Creatinine, Ser: 2.06 mg/dL — ABNORMAL HIGH (ref 0.44–1.00)
GFR, Estimated: 23 mL/min — ABNORMAL LOW (ref >60)
Glucose, Bld: 87 mg/dL (ref 70–99)
Potassium: 4.2 mmol/L (ref 3.5–5.1)
Sodium: 135 mmol/L (ref 135–145)

## 2020-10-20 LAB — MAGNESIUM: Magnesium: 1.5 mg/dL — ABNORMAL LOW (ref 1.7–2.4)

## 2020-10-20 MED ORDER — ACETAMINOPHEN 325 MG PO TABS
650.0000 mg | ORAL_TABLET | Freq: Four times a day (QID) | ORAL | Status: DC | PRN
  Administered 2020-10-20: 650 mg via ORAL
  Filled 2020-10-20: qty 2

## 2020-10-20 MED ORDER — QUETIAPINE FUMARATE 25 MG PO TABS
25.0000 mg | ORAL_TABLET | Freq: Every day | ORAL | Status: DC
  Administered 2020-10-20: 25 mg via ORAL
  Filled 2020-10-20: qty 1

## 2020-10-20 MED ORDER — MAGNESIUM SULFATE 2 GM/50ML IV SOLN
2.0000 g | Freq: Once | INTRAVENOUS | Status: AC
  Administered 2020-10-20: 2 g via INTRAVENOUS
  Filled 2020-10-20: qty 50

## 2020-10-20 NOTE — Progress Notes (Addendum)
PROGRESS NOTE    [name]  DJS:[id number] DOB: [birth year] DOA: 10/18/2020 PCP: Sallee Lange, NP   Chief complaint.  Left leg redness and pain. Brief Narrative:  [name] a 85 y.o.femalewith medical history significant forbreast cancer status post chemo and radiation therapy, nicotine dependence, chronic atrial fibrillation, chronic venous stasis who presents to the ER via EMS for evaluation of sudden onset left leg redness, swelling and differential warmth that started early morning 5/20. She initially placed on cefazolin for cellulitis.  Blood cultures on 5/21 positive for gram-negative rods, antibiotic changed to Zosyn.   Assessment & Plan:   Principal Problem:   Left leg cellulitis Active Problems:   Severe sepsis (HCC)   Chronic a-fib (HCC)   NASH (nonalcoholic steatohepatitis)   Venous ulcer of ankle ([place])   AKI (acute kidney injury) ([place])  #1.  Severe sepsis with septic shock secondary to cellulitis. Acute kidney injury on stage IV chronic kidney disease secondary to sepsis. ATN Metabolic acidosis. Lactic acidosis.  Blood culture with gram-negative rods, still pending identification and susceptibility.  Patient currently hemodynamically stable.  Continue Zosyn for now. Renal function gradually improving, most likely ATN due to shock. Patient still has significant metabolic acidosis, will continue bicarbonate drip. Patient currently does not have any evidence of volume overload, no hypoxia, not short of breath.  No crackles in the base.  #2.  Chronic atrial fibrillation. Continue Eliquis.  3.  Liver cirrhosis secondary to [place]. Diuretics on hold due to severe sepsis.  #4.  Hypomagnesemia.  Supplemented.   DVT prophylaxis: Eliquis Code Status: Full Family Communication: Called son again, not able to reach him. Disposition Plan:  .   Status is: Inpatient  Remains inpatient appropriate because:Inpatient level of care appropriate due to  severity of illness   Dispo: The patient is from: Home              Anticipated d/c is to: SNF              Patient currently is not medically stable to d/c.   Difficult to place patient No        I/O last 3 completed shifts: In: 57 [P.O.:960] Out: 200 [Urine:200] No intake/output data recorded.     Consultants:   None  Procedures: None  Antimicrobials: Zosyn  Subjective: Patient had 3 large stools yesterday, loose.  No abdominal pain.  No nausea vomiting. She still has some pain in the left lower extremity, but much improved. She could not sleep last night, but no confusion. Denies any short of breath or cough. No dysuria hematuria No fever chills.  Objective: Vitals:   10/19/20 1716 10/19/20 2042 10/20/20 0425 10/20/20 0753  BP: (!) 98/52 104/71 127/77 118/72  Pulse: 85 85 76 74  Resp: 16 17 18 16   Temp: (!) 97.5 F (36.4 C) 97.6 F (36.4 C) 97.8 F (36.6 C) 98 F (36.7 C)  TempSrc: Oral     SpO2: 100% 97% 100% 100%  Weight:      Height:        Intake/Output Summary (Last 24 hours) at 10/20/2020 1047 Last data filed at 10/20/2020 0449 Gross per 24 hour  Intake 960 ml  Output 150 ml  Net 810 ml   Filed Weights   10/18/20 1140  Weight: 76.8 kg    Examination:  General exam: Appears calm and comfortable  Respiratory system: Clear to auscultation. Respiratory effort normal. Cardiovascular system: S1 & S2 heard, RRR. No JVD, murmurs,  rubs, gallops or clicks. No pedal edema. Gastrointestinal system: Abdomen is nondistended, soft and nontender. No organomegaly or masses felt. Normal bowel sounds heard. Central nervous system: Alert and oriented x3. No focal neurological deficits. Extremities: Symmetric 5 x 5 power. Skin: No rashes, lesions or ulcers Psychiatry:  Mood & affect appropriate.     Data Reviewed: I have personally reviewed following labs and imaging studies  CBC: Recent Labs  Lab 10/18/20 1349 10/19/20 0151  WBC 8.5 5.8  HGB  12.3 10.5*  HCT 38.0 31.9*  MCV 97.7 97.6  PLT 243 242   Basic Metabolic Panel: Recent Labs  Lab 10/18/20 1349 10/19/20 0151 10/20/20 0918  NA 139 136 135  K 4.4 4.2 4.2  CL 110 110 106  CO2 19* 17* 19*  GLUCOSE 98 95 87  BUN 75* 66* 62*  CREATININE 2.21* 2.11* 2.06*  CALCIUM 8.6* 7.6* 7.7*  MG  --   --  1.5*   GFR: Estimated Creatinine Clearance: 22.6 mL/min (A) (by C-G formula based on SCr of 2.06 mg/dL (H)). Liver Function Tests: Recent Labs  Lab 10/18/20 1349  AST 44*  ALT 30  ALKPHOS 129*  BILITOT 1.6*  PROT 5.8*  ALBUMIN 3.0*   No results for input(s): LIPASE, AMYLASE in the last 168 hours. No results for input(s): AMMONIA in the last 168 hours. Coagulation Profile: No results for input(s): INR, PROTIME in the last 168 hours. Cardiac Enzymes: No results for input(s): CKTOTAL, CKMB, CKMBINDEX, TROPONINI in the last 168 hours. BNP (last 3 results) No results for input(s): PROBNP in the last 8760 hours. HbA1C: No results for input(s): HGBA1C in the last 72 hours. CBG: No results for input(s): GLUCAP in the last 168 hours. Lipid Profile: No results for input(s): CHOL, HDL, LDLCALC, TRIG, CHOLHDL, LDLDIRECT in the last 72 hours. Thyroid Function Tests: No results for input(s): TSH, T4TOTAL, FREET4, T3FREE, THYROIDAB in the last 72 hours. Anemia Panel: No results for input(s): VITAMINB12, FOLATE, FERRITIN, TIBC, IRON, RETICCTPCT in the last 72 hours. Sepsis Labs: Recent Labs  Lab 10/18/20 1349 10/18/20 1927 10/18/20 2232 10/19/20 0151  LATICACIDVEN 3.3* 4.5* 3.9* 3.4*    Recent Results (from the past 240 hour(s))  Blood culture (single)     Status: None (Preliminary result)   Collection Time: 10/18/20  1:49 PM   Specimen: BLOOD  Result Value Ref Range Status   Specimen Description   Final    BLOOD BLOOD LEFT ARM Performed at [facility], [address] [place], [place] [address]    Special Requests   Final    BOTTLES DRAWN AEROBIC  AND ANAEROBIC Blood Culture results may not be optimal due to an inadequate volume of blood received in culture bottles Performed at [facility], [address] [place], [place] [address]    Culture  Setup Time   Final    AEROBIC BOTTLE ONLY GRAM NEGATIVE RODS CRITICAL RESULT CALLED TO, READ BACK BY AND VERIFIED WITH: ALEX CHAPPELL AT 4008 ON 10/19/2020 Lily. Performed at [facility], [place] [address] [place], [place] [address]    Culture GRAM NEGATIVE RODS  Final   Report Status PENDING  Incomplete  Blood Culture ID Panel (Reflexed)     Status: None   Collection Time: 10/18/20  1:49 PM  Result Value Ref Range Status   Enterococcus faecalis NOT DETECTED NOT DETECTED Final   Enterococcus Faecium NOT DETECTED NOT DETECTED Final   Listeria monocytogenes NOT DETECTED NOT DETECTED Final  Staphylococcus species NOT DETECTED NOT DETECTED Final   Staphylococcus aureus (BCID) NOT DETECTED NOT DETECTED Final   Staphylococcus epidermidis NOT DETECTED NOT DETECTED Final   Staphylococcus lugdunensis NOT DETECTED NOT DETECTED Final   Streptococcus species NOT DETECTED NOT DETECTED Final   Streptococcus agalactiae NOT DETECTED NOT DETECTED Final   Streptococcus pneumoniae NOT DETECTED NOT DETECTED Final   Streptococcus pyogenes NOT DETECTED NOT DETECTED Final   A.calcoaceticus-baumannii NOT DETECTED NOT DETECTED Final   Bacteroides fragilis NOT DETECTED NOT DETECTED Final   Enterobacterales NOT DETECTED NOT DETECTED Final   Enterobacter cloacae complex NOT DETECTED NOT DETECTED Final   Escherichia coli NOT DETECTED NOT DETECTED Final   Klebsiella aerogenes NOT DETECTED NOT DETECTED Final   Klebsiella oxytoca NOT DETECTED NOT DETECTED Final   Klebsiella pneumoniae NOT DETECTED NOT DETECTED Final   Proteus species NOT DETECTED NOT DETECTED Final   Salmonella species NOT DETECTED NOT DETECTED Final   Serratia marcescens NOT DETECTED NOT DETECTED Final   Haemophilus influenzae NOT  DETECTED NOT DETECTED Final   Neisseria meningitidis NOT DETECTED NOT DETECTED Final   Pseudomonas aeruginosa NOT DETECTED NOT DETECTED Final   Stenotrophomonas maltophilia NOT DETECTED NOT DETECTED Final   Candida albicans NOT DETECTED NOT DETECTED Final   Candida auris NOT DETECTED NOT DETECTED Final   Candida glabrata NOT DETECTED NOT DETECTED Final   Candida krusei NOT DETECTED NOT DETECTED Final   Candida parapsilosis NOT DETECTED NOT DETECTED Final   Candida tropicalis NOT DETECTED NOT DETECTED Final   Cryptococcus neoformans/gattii NOT DETECTED NOT DETECTED Final    Comment: Performed at [facility], [address] [place], [place] [address]  SARS CORONAVIRUS 2 (TAT 6-24 HRS) Nasopharyngeal Nasopharyngeal Swab     Status: None   Collection Time: 10/18/20  3:30 PM   Specimen: Nasopharyngeal Swab  Result Value Ref Range Status   SARS Coronavirus 2 NEGATIVE NEGATIVE Final    Comment: (NOTE) SARS-CoV-2 target nucleic acids are NOT DETECTED.  The SARS-CoV-2 RNA is generally detectable in upper and lower respiratory specimens during the acute phase of infection. Negative results do not preclude SARS-CoV-2 infection, do not rule out co-infections with other pathogens, and should not be used as the sole basis for treatment or other patient management decisions. Negative results must be combined with clinical observations, patient history, and epidemiological information. The expected result is Negative.  Fact Sheet for Patients: SugarRoll.be  Fact Sheet for Healthcare Providers: https://www.woods-mathews.com/  This test is not yet approved or cleared by the [place] FDA and  has been authorized for detection and/or diagnosis of SARS-CoV-2 by FDA under an Emergency Use Authorization (EUA). This EUA will remain  in effect (meaning this test can be used) for the duration of the COVID-19 declaration under Se ction 564(b)(1) of  the Act, 21 U.S.C. section 360bbb-3(b)(1), unless the authorization is terminated or revoked sooner.  Performed at [facility], [place] [address] [place], [place] [address]          Radiology Studies: US Venous Img Lower Bilateral (DVT)  Result Date: 10/18/2020 CLINICAL DATA:  Bilateral lower extremity pain and swelling EXAM: BILATERAL LOWER EXTREMITY VENOUS DOPPLER ULTRASOUND TECHNIQUE: Gray-scale sonography with graded compression, as well as color Doppler and duplex ultrasound were performed to evaluate the lower extremity deep venous systems from the level of the common femoral vein and including the common femoral, femoral, profunda femoral, popliteal and calf veins including the posterior tibial, peroneal and gastrocnemius veins when visible. The  superficial great saphenous vein was also interrogated. Spectral Doppler was utilized to evaluate flow at rest and with distal augmentation maneuvers in the common femoral, femoral and popliteal veins. COMPARISON:  None. FINDINGS: RIGHT LOWER EXTREMITY Common Femoral Vein: No evidence of thrombus. Normal compressibility, respiratory phasicity and response to augmentation. Saphenofemoral Junction: No evidence of thrombus. Normal compressibility and flow on color Doppler imaging. Profunda Femoral Vein: No evidence of thrombus. Normal compressibility and flow on color Doppler imaging. Femoral Vein: No evidence of thrombus. Normal compressibility, respiratory phasicity and response to augmentation. Popliteal Vein: No evidence of thrombus. Normal compressibility, respiratory phasicity and response to augmentation. Calf Veins: No evidence of thrombus. Normal compressibility and flow on color Doppler imaging. Superficial Great Saphenous Vein: No evidence of thrombus. Normal compressibility. Venous Reflux:  None. Other Findings:  None. LEFT LOWER EXTREMITY Common Femoral Vein: No evidence of thrombus. Normal compressibility, respiratory phasicity and  response to augmentation. Saphenofemoral Junction: No evidence of thrombus. Normal compressibility and flow on color Doppler imaging. Profunda Femoral Vein: No evidence of thrombus. Normal compressibility and flow on color Doppler imaging. Femoral Vein: No evidence of thrombus. Normal compressibility, respiratory phasicity and response to augmentation. Popliteal Vein: No evidence of thrombus. Normal compressibility, respiratory phasicity and response to augmentation. Calf Veins: No evidence of thrombus. Normal compressibility and flow on color Doppler imaging. Superficial Great Saphenous Vein: No evidence of thrombus. Normal compressibility. Venous Reflux:  None. Other Findings:  None. IMPRESSION: No evidence of deep venous thrombosis in either lower extremity. Electronically Signed   By: Jacqulynn Cadet M.D.   On: 10/18/2020 13:55        Scheduled Meds: . apixaban  2.5 mg Oral BID  . feeding supplement  237 mL Oral TID BM  . metoprolol tartrate  12.5 mg Oral BID  . multivitamin with minerals  1 tablet Oral Daily  . pantoprazole  40 mg Oral Daily   Continuous Infusions: . magnesium sulfate bolus IVPB    . piperacillin-tazobactam (ZOSYN)  IV 3.375 g (10/20/20 5697)  . sodium bicarbonate in 0.45 NS mL infusion 100 mL/hr at 10/20/20 0050     LOS: 2 days    Time spent: 26 minutes    Sharen Hones, MD [facility]   To contact the attending provider between 7A-7P or the covering provider during after hours 7P-7A, please log into the web site [email or web address] and access using universal [place] password for that web site. If you do not have the password, please call the hospital operator.  10/20/2020, 10:47 AM

## 2020-10-20 NOTE — TOC Initial Note (Signed)
Transition of Care [facility]) - Initial/Assessment Note    Patient Details  Name: [name] MRN: [id number] Date of Birth: 1935-06-25  Transition of Care [facility]) CM/SW Contact:    Boris Sharper, LCSW Phone Number: 10/20/2020, 2:12 PM  Clinical Narrative:                CSW spoke with pt and her daughter regarding discharge plans they were in agreement with pt going to SNF. CSW explained the process and asked if they had a preference and pt's daughter prefers Radiation protection practitioner. CSW completed FL2 and PASRR and faxed to surrounding facilities.   Expected Discharge Plan: Skilled Nursing Facility Barriers to Discharge: Continued Medical Work up   Patient Goals and CMS Choice Patient states their goals for this hospitalization and ongoing recovery are:: Daugter thinks a SNF is the best otpion at this time CMS Medicare.gov Compare Post Acute Care list provided to:: Patient Choice offered to / list presented to : Patient  Expected Discharge Plan and Services Expected Discharge Plan: [place]       Living arrangements for the past 2 months: Single Family Home                                      Prior Living Arrangements/Services Living arrangements for the past 2 months: Single Family Home Lives with:: Spouse Patient language and need for interpreter reviewed:: Yes        Need for Family Participation in Patient Care: Yes (Comment) Care giver support system in place?: Yes (comment)   Criminal Activity/Legal Involvement Pertinent to Current Situation/Hospitalization: No - Comment as needed  Activities of Daily Living Home Assistive Devices/Equipment: Walker (specify type),Raised toilet seat with rails,Eyeglasses ADL Screening (condition at time of admission) Patient's cognitive ability adequate to safely complete daily activities?: Yes Is the patient deaf or have difficulty hearing?: Yes Does the patient have difficulty seeing, even when wearing  glasses/contacts?: No Does the patient have difficulty concentrating, remembering, or making decisions?: No Patient able to express need for assistance with ADLs?: Yes Does the patient have difficulty dressing or bathing?: Yes Independently performs ADLs?: No Communication: Independent Dressing (OT): Needs assistance Is this a change from baseline?: Pre-admission baseline Grooming: Independent Feeding: Independent Bathing: Needs assistance Is this a change from baseline?: Change from baseline, expected to last >3 days Toileting: Needs assistance Is this a change from baseline?: Change from baseline, expected to last >3days In/Out Bed: Needs assistance Is this a change from baseline?: Change from baseline, expected to last >3 days Walks in Home: Independent with device (comment) Does the patient have difficulty walking or climbing stairs?: Yes Weakness of Legs: Both Weakness of Arms/Hands: None  Permission Sought/Granted Permission sought to share information with : Facility Art therapist granted to share information with : Yes, Verbal Permission Granted  Share Information with NAME: [name]     Permission granted to share info w Relationship: daughter  Permission granted to share info w Contact Information: [phone]  Emotional Assessment Appearance:: Other (Comment Required (unable to assess) Attitude/Demeanor/Rapport: Unable to Assess Affect (typically observed): Unable to Assess Orientation: : Oriented to Self,Oriented to Place,Oriented to  Time,Oriented to Situation Alcohol / Substance Use: Not Applicable Psych Involvement: No (comment)  Admission diagnosis:  Leg pain [M79.606] Cellulitis of left lower extremity [L03.116] Left leg cellulitis [L93.570] Patient Active Problem List   Diagnosis Date Noted  . Left  leg cellulitis 10/18/2020  . AKI (acute kidney injury) ([place]) 10/18/2020  . Venous ulcer of ankle ([place]) 10/11/2020  . Lymphedema 10/11/2020   . ASCVD (arteriosclerotic cardiovascular disease) 10/10/2020  . Neuropathy 10/10/2020  . Osteoarthritis 10/10/2020  . Chronic a-fib ([place]) 08/12/2020  . Dizziness 08/12/2020  . NASH (nonalcoholic steatohepatitis) 07/13/2020  . Personal history of breast cancer 07/13/2020  . Personal history of pulmonary embolism 07/13/2020  . Gastro-esophageal reflux disease without esophagitis 07/12/2020  . Other cirrhosis of liver ([place]) 07/10/2020  . Malignant neoplasm of liver, not specified as primary or secondary ([place]) 07/10/2020  . Debility 06/29/2020  . Severe sepsis ([place]) 06/12/2020  . Bilateral leg edema 06/07/2020  . Hyperlipidemia, mixed 06/07/2020  . SOBOE (shortness of breath on exertion) 06/07/2020  . Acute pulmonary embolism ([place]) 06/02/2020  . History of lumbar surgery 07/22/2018  . Spinal stenosis of lumbar region at multiple levels 07/22/2018  . LBP (low back pain) 02/14/2015  . 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 Splenomegaly 11/21/2014  . Pain and swelling of lower leg 07/30/2014  . Osteopenia 06/01/2014  . Leg cramps 05/30/2014  . Breast cancer ([place]) 01/19/2013  . Breast CA ([place]) 01/19/2013   PCP:  Sallee Lange, NP Pharmacy:   [facility], [address], [place] [address] Phone: [phone] Fax: [facility], [place] [address] Phone: [phone] Fax: [phone]  [id number] [place], [address] [place] [address] Phone: [phone] Fax: [phone]     Social Determinants of Health (SDOH) Interventions    Readmission Risk Interventions No flowsheet data found.

## 2020-10-20 NOTE — NC FL2 (Signed)
[place] LEVEL OF CARE SCREENING TOOL     IDENTIFICATION  Patient Name: [name] Birthdate: 1936-03-06 Sex: female Admission Date (Current Location): 10/18/2020  [place] and [place] Number:  Engineering geologist and Address:  [facility], [address], [place], [place] [address]      Provider Number: [id number]  Attending Physician Name and Address:  Sharen Hones, MD  Relative Name and Phone Number:  [name] [phone]    Current Level of Care: Hospital Recommended Level of Care: [place] Prior Approval Number:    Date Approved/Denied:   PASRR Number: [id number]  Discharge Plan: SNF    Current Diagnoses: Patient Active Problem List   Diagnosis Date Noted  . Left leg cellulitis 10/18/2020  . AKI (acute kidney injury) ([place]) 10/18/2020  . Venous ulcer of ankle ([place]) 10/11/2020  . Lymphedema 10/11/2020  . ASCVD (arteriosclerotic cardiovascular disease) 10/10/2020  . Neuropathy 10/10/2020  . Osteoarthritis 10/10/2020  . Chronic a-fib ([place]) 08/12/2020  . Dizziness 08/12/2020  . NASH (nonalcoholic steatohepatitis) 07/13/2020  . Personal history of breast cancer 07/13/2020  . Personal history of pulmonary embolism 07/13/2020  . Gastro-esophageal reflux disease without esophagitis 07/12/2020  . Other cirrhosis of liver ([place]) 07/10/2020  . Malignant neoplasm of liver, not specified as primary or secondary ([place]) 07/10/2020  . Debility 06/29/2020  . Severe sepsis ([place]) 06/12/2020  . Bilateral leg edema 06/07/2020  . Hyperlipidemia, mixed 06/07/2020  . SOBOE (shortness of breath on exertion) 06/07/2020  . Acute pulmonary embolism ([place]) 06/02/2020  . History of lumbar surgery 07/22/2018  . Spinal stenosis of lumbar region at multiple levels 07/22/2018  . LBP (low back pain) 02/14/2015  . 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 Splenomegaly 11/21/2014  . Pain and swelling of lower leg 07/30/2014  .  Osteopenia 06/01/2014  . Leg cramps 05/30/2014  . Breast cancer ([place]) 01/19/2013  . Breast CA ([place]) 01/19/2013    Orientation RESPIRATION BLADDER Height & Weight     Self,Time,Situation,Place  Normal Continent Weight: 169 lb 6.4 oz (76.8 kg) Height:  5\' 8"  (172.7 cm)  BEHAVIORAL SYMPTOMS/MOOD NEUROLOGICAL BOWEL NUTRITION STATUS      Continent Diet  AMBULATORY STATUS COMMUNICATION OF NEEDS Skin   Extensive Assist Verbally Normal                       Personal Care Assistance Level of Assistance  Bathing,Feeding,Dressing Bathing Assistance: Limited assistance Feeding assistance: Limited assistance Dressing Assistance: Limited assistance     Functional Limitations Info  Sight,Hearing,Speech Sight Info: Adequate Hearing Info: Impaired Speech Info: Adequate    SPECIAL CARE FACTORS FREQUENCY  OT (By licensed OT),PT (By licensed PT)     PT Frequency: 5x week OT Frequency: 5x week            Contractures Contractures Info: Not present    Additional Factors Info  Code Status Code Status Info: Full             Current Medications (10/20/2020):  This is the current hospital active medication list Current Facility-Administered Medications  Medication Dose Route Frequency Provider Last Rate Last Admin  . apixaban (ELIQUIS) tablet 2.5 mg  2.5 mg Oral BID Renda Rolls, RPH   2.5 mg at 10/20/20 1032  . feeding supplement (ENSURE ENLIVE / ENSURE PLUS) liquid 237 mL  237 mL Oral TID BM Sharen Hones, MD   237 mL at 10/20/20 1315  . metoprolol tartrate (LOPRESSOR) tablet  12.5 mg  12.5 mg Oral BID Agbata, Tochukwu, MD   12.5 mg at 10/20/20 1033  . multivitamin with minerals tablet 1 tablet  1 tablet Oral Daily Sharen Hones, MD   1 tablet at 10/20/20 1032  . ondansetron (ZOFRAN) tablet 4 mg  4 mg Oral Q6H PRN Agbata, Tochukwu, MD       Or  . ondansetron (ZOFRAN) injection 4 mg  4 mg Intravenous Q6H PRN Agbata, Tochukwu, MD      . pantoprazole (PROTONIX) EC tablet 40 mg   40 mg Oral Daily Agbata, Tochukwu, MD   40 mg at 10/20/20 1032  . piperacillin-tazobactam (ZOSYN) IVPB 3.375 g  3.375 g Intravenous Redmond Pulling, MD 12.5 mL/hr at 10/20/20 1316 3.375 g at 10/20/20 1316  . QUEtiapine (SEROQUEL) tablet 25 mg  25 mg Oral QHS Sharen Hones, MD      . sodium bicarbonate 75 mEq in sodium chloride 0.45 % 1,075 mL infusion   Intravenous Continuous Sharen Hones, MD 100 mL/hr at 10/20/20 1211 New Bag at 10/20/20 1211     Discharge Medications: Please see discharge summary for a list of discharge medications.  Relevant Imaging Results:  Relevant Lab Results:   Additional Information SS#: [id number]  Boris Sharper, LCSW

## 2020-10-21 LAB — CULTURE, BLOOD (SINGLE)

## 2020-10-21 LAB — BASIC METABOLIC PANEL
Anion gap: 10 (ref 5–15)
BUN: 65 mg/dL — ABNORMAL HIGH (ref 8–23)
CO2: 24 mmol/L (ref 22–32)
Calcium: 7.5 mg/dL — ABNORMAL LOW (ref 8.9–10.3)
Chloride: 104 mmol/L (ref 98–111)
Creatinine, Ser: 2.05 mg/dL — ABNORMAL HIGH (ref 0.44–1.00)
GFR, Estimated: 24 mL/min — ABNORMAL LOW (ref >60)
Glucose, Bld: 92 mg/dL (ref 70–99)
Potassium: 4.4 mmol/L (ref 3.5–5.1)
Sodium: 138 mmol/L (ref 135–145)

## 2020-10-21 LAB — CBC WITH DIFFERENTIAL/PLATELET
Abs Immature Granulocytes: 0.07 10*3/uL (ref 0.00–0.07)
Basophils Absolute: 0 10*3/uL (ref 0.0–0.1)
Basophils Relative: 0 %
Eosinophils Absolute: 0.2 10*3/uL (ref 0.0–0.5)
Eosinophils Relative: 2 %
HCT: 31.8 % — ABNORMAL LOW (ref 36.0–46.0)
Hemoglobin: 10.8 g/dL — ABNORMAL LOW (ref 12.0–15.0)
Immature Granulocytes: 1 %
Lymphocytes Relative: 16 %
Lymphs Abs: 1.2 10*3/uL (ref 0.7–4.0)
MCH: 31.9 pg (ref 26.0–34.0)
MCHC: 34 g/dL (ref 30.0–36.0)
MCV: 93.8 fL (ref 80.0–100.0)
Monocytes Absolute: 0.7 10*3/uL (ref 0.1–1.0)
Monocytes Relative: 9 %
Neutro Abs: 5.2 10*3/uL (ref 1.7–7.7)
Neutrophils Relative %: 72 %
Platelets: 189 10*3/uL (ref 150–400)
RBC: 3.39 MIL/uL — ABNORMAL LOW (ref 3.87–5.11)
RDW: 18.2 % — ABNORMAL HIGH (ref 11.5–15.5)
WBC: 7.2 10*3/uL (ref 4.0–10.5)
nRBC: 0 % (ref 0.0–0.2)

## 2020-10-21 LAB — MAGNESIUM: Magnesium: 1.9 mg/dL (ref 1.7–2.4)

## 2020-10-21 MED ORDER — SODIUM CHLORIDE 0.9 % IV SOLN
3.0000 g | Freq: Two times a day (BID) | INTRAVENOUS | Status: DC
  Administered 2020-10-21 – 2020-10-22 (×3): 3 g via INTRAVENOUS
  Filled 2020-10-21: qty 3
  Filled 2020-10-21 (×4): qty 8

## 2020-10-21 MED ORDER — LACTATED RINGERS IV SOLN: INTRAVENOUS | Status: DC

## 2020-10-21 NOTE — TOC Progression Note (Addendum)
Transition of Care [facility]) - Progression Note    Patient Details  Name: [name] MRN: [id number] Date of Birth: 1935/09/26  Transition of Care [facility]) CM/SW [place], RN Phone Number: 10/21/2020, 3:49 PM  Clinical Narrative:     Reviewed bed offer from [facility], the patient accepted the bed offer, started ins auth process, ref number [id number]  Expected Discharge Plan: [place] Barriers to Discharge: Continued Medical Work up  Expected Discharge Plan and Services Expected Discharge Plan: [place] arrangements for the past 2 months: Single Family Home                                       Social Determinants of Health (SDOH) Interventions    Readmission Risk Interventions No flowsheet data found.

## 2020-10-21 NOTE — Progress Notes (Signed)
PROGRESS NOTE    [name]  BOF:[id number] DOB: August 26, 1935 DOA: 10/18/2020 PCP: Sallee Lange, NP   CC.  Left leg redness and pain. Brief Narrative:  [name] a 85 y.o.femalewith medical history significant forbreast cancer status post chemo and radiation therapy, nicotine dependence, chronic atrial fibrillation, chronic venous stasis who presents to the ER via EMS for evaluation of sudden onset left leg redness, swelling and differential warmth that started early morning5/20. She initially placed on cefazolin for cellulitis. Blood cultures on 5/21 positive for gram-negative rods, antibiotic changed to Zosyn.   Assessment & Plan:   Principal Problem:   Left leg cellulitis Active Problems:   Severe sepsis (HCC)   Chronic a-fib (HCC)   NASH (nonalcoholic steatohepatitis)   Venous ulcer of ankle ([place])   AKI (acute kidney injury) ([place])  #1. Severe sepsis with septic shock secondary to cellulitis. Acute kidney injury on stage IV chronic kidney disease secondary to sepsis. ATN Metabolic acidosis. Lactic acidosis. Patient currently hemodynamically stable.  Blood culture came back with Acinetobacter Junii.  Only had a 1 blood draw, not sure this is real or contaminant.  We will continue Unasyn for now. At discharge, I will treat her with Augmentin plus Cipro. Patient renal function is improving, metabolic acidosis has resolved.  I will change IV fluids to lactated Ringer.  Recheck renal function tomorrow.  #2.  Chronic atrial fibrillation. Continue Eliquis.  3.  Liver cirrhosis secondary to [place]. Diuretics on hold.  4.  Hypomagnesemia Improved.   DVT prophylaxis: Eliquis Code Status: Full Family Communication: Not able to Disposition Plan:  .   Status is: Inpatient  Remains inpatient appropriate because:Inpatient level of care appropriate due to severity of illness   Dispo: The patient is from: Home              Anticipated d/c is to: SNF               Patient currently is not medically stable to d/c.   Difficult to place patient No        I/O last 3 completed shifts: In: 480 [P.O.:480] Out: 150 [Urine:150] No intake/output data recorded.     Consultants:   None  Procedures: None  Antimicrobials:  Unasyn  Subjective: Patient slept well last night, she still has some loose stools.  But no nausea vomiting or abdominal pain. Left leg pain is much better. No fever or chills Denies any short of breath or cough.  Objective: Vitals:   10/20/20 0753 10/20/20 1611 10/20/20 1940 10/21/20 0815  BP: 118/72 104/78 105/68 (!) 138/91  Pulse: 74 77 74 73  Resp: 16 18 17 14   Temp: 98 F (36.7 C) 98.6 F (37 C) 97.8 F (36.6 C) (!) 97.5 F (36.4 C)  TempSrc:    Oral  SpO2: 100% 100% 98% 98%  Weight:      Height:        Intake/Output Summary (Last 24 hours) at 10/21/2020 1353 Last data filed at 10/20/2020 1851 Gross per 24 hour  Intake 480 ml  Output --  Net 480 ml   Filed Weights   10/18/20 1140  Weight: 76.8 kg    Examination:  General exam: Appears calm and comfortable  Respiratory system: Clear to auscultation. Respiratory effort normal. Cardiovascular system: Irregular, no JVD, murmurs, rubs, gallops or clicks. No pedal edema. Gastrointestinal system: Abdomen is nondistended, soft and nontender. No organomegaly or masses felt. Normal bowel sounds heard. Central nervous system: Alert and  oriented x3. No focal neurological deficits. Extremities: Left leg swelling, red, but condition improving. Skin: No rashes, lesions or ulcers Psychiatry: Judgement and insight appear normal. Mood & affect appropriate.     Data Reviewed: I have personally reviewed following labs and imaging studies  CBC: Recent Labs  Lab 10/18/20 1349 10/19/20 0151 10/20/20 1116 10/21/20 0558  WBC 8.5 5.8 9.1 7.2  NEUTROABS  --   --  6.9 5.2  HGB 12.3 10.5* 11.7* 10.8*  HCT 38.0 31.9* 35.9* 31.8*  MCV 97.7 97.6 98.4 93.8   PLT 243 159 176 867   Basic Metabolic Panel: Recent Labs  Lab 10/18/20 1349 10/19/20 0151 10/20/20 0918 10/21/20 0558  NA 139 136 135 138  K 4.4 4.2 4.2 4.4  CL 110 110 106 104  CO2 19* 17* 19* 24  GLUCOSE 98 95 87 92  BUN 75* 66* 62* 65*  CREATININE 2.21* 2.11* 2.06* 2.05*  CALCIUM 8.6* 7.6* 7.7* 7.5*  MG  --   --  1.5* 1.9   GFR: Estimated Creatinine Clearance: 22.7 mL/min (A) (by C-G formula based on SCr of 2.05 mg/dL (H)). Liver Function Tests: Recent Labs  Lab 10/18/20 1349  AST 44*  ALT 30  ALKPHOS 129*  BILITOT 1.6*  PROT 5.8*  ALBUMIN 3.0*   No results for input(s): LIPASE, AMYLASE in the last 168 hours. No results for input(s): AMMONIA in the last 168 hours. Coagulation Profile: No results for input(s): INR, PROTIME in the last 168 hours. Cardiac Enzymes: No results for input(s): CKTOTAL, CKMB, CKMBINDEX, TROPONINI in the last 168 hours. BNP (last 3 results) No results for input(s): PROBNP in the last 8760 hours. HbA1C: No results for input(s): HGBA1C in the last 72 hours. CBG: No results for input(s): GLUCAP in the last 168 hours. Lipid Profile: No results for input(s): CHOL, HDL, LDLCALC, TRIG, CHOLHDL, LDLDIRECT in the last 72 hours. Thyroid Function Tests: No results for input(s): TSH, T4TOTAL, FREET4, T3FREE, THYROIDAB in the last 72 hours. Anemia Panel: No results for input(s): VITAMINB12, FOLATE, FERRITIN, TIBC, IRON, RETICCTPCT in the last 72 hours. Sepsis Labs: Recent Labs  Lab 10/18/20 1349 10/18/20 1927 10/18/20 2232 10/19/20 0151  LATICACIDVEN 3.3* 4.5* 3.9* 3.4*    Recent Results (from the past 240 hour(s))  Blood culture (single)     Status: Abnormal   Collection Time: 10/18/20  1:49 PM   Specimen: BLOOD  Result Value Ref Range Status   Specimen Description   Final    BLOOD BLOOD LEFT ARM Performed at [facility], [address], [place], [place] [address]    Special Requests   Final    BOTTLES DRAWN AEROBIC AND  ANAEROBIC Blood Culture results may not be optimal due to an inadequate volume of blood received in culture bottles Performed at [facility], [address] [place], [place] [address]    Culture  Setup Time   Final    AEROBIC BOTTLE ONLY GRAM NEGATIVE RODS CRITICAL RESULT CALLED TO, READ BACK BY AND VERIFIED WITH: ALEX CHAPPELL AT 2836 ON 10/19/2020 [place]. Performed at [facility], [place] [address] [place], [place] [address]    Culture ACINETOBACTER JUNII (A)  Final   Report Status 10/21/2020 FINAL  Final   Organism ID, Bacteria ACINETOBACTER JUNII  Final      Susceptibility   Acinetobacter junii - MIC*    CEFTAZIDIME 4 SENSITIVE Sensitive     CIPROFLOXACIN <=0.25 SENSITIVE Sensitive     GENTAMICIN <=1 SENSITIVE Sensitive  IMIPENEM <=0.25 SENSITIVE Sensitive     PIP/TAZO <=4 SENSITIVE Sensitive     TRIMETH/SULFA <=20 SENSITIVE Sensitive     AMPICILLIN/SULBACTAM <=2 SENSITIVE Sensitive     * ACINETOBACTER JUNII  Blood Culture ID Panel (Reflexed)     Status: None   Collection Time: 10/18/20  1:49 PM  Result Value Ref Range Status   Enterococcus faecalis NOT DETECTED NOT DETECTED Final   Enterococcus Faecium NOT DETECTED NOT DETECTED Final   Listeria monocytogenes NOT DETECTED NOT DETECTED Final   Staphylococcus species NOT DETECTED NOT DETECTED Final   Staphylococcus aureus (BCID) NOT DETECTED NOT DETECTED Final   Staphylococcus epidermidis NOT DETECTED NOT DETECTED Final   Staphylococcus lugdunensis NOT DETECTED NOT DETECTED Final   Streptococcus species NOT DETECTED NOT DETECTED Final   Streptococcus agalactiae NOT DETECTED NOT DETECTED Final   Streptococcus pneumoniae NOT DETECTED NOT DETECTED Final   Streptococcus pyogenes NOT DETECTED NOT DETECTED Final   A.calcoaceticus-baumannii NOT DETECTED NOT DETECTED Final   Bacteroides fragilis NOT DETECTED NOT DETECTED Final   Enterobacterales NOT DETECTED NOT DETECTED Final   Enterobacter cloacae complex NOT  DETECTED NOT DETECTED Final   Escherichia coli NOT DETECTED NOT DETECTED Final   Klebsiella aerogenes NOT DETECTED NOT DETECTED Final   Klebsiella oxytoca NOT DETECTED NOT DETECTED Final   Klebsiella pneumoniae NOT DETECTED NOT DETECTED Final   Proteus species NOT DETECTED NOT DETECTED Final   Salmonella species NOT DETECTED NOT DETECTED Final   Serratia marcescens NOT DETECTED NOT DETECTED Final   Haemophilus influenzae NOT DETECTED NOT DETECTED Final   Neisseria meningitidis NOT DETECTED NOT DETECTED Final   Pseudomonas aeruginosa NOT DETECTED NOT DETECTED Final   Stenotrophomonas maltophilia NOT DETECTED NOT DETECTED Final   Candida albicans NOT DETECTED NOT DETECTED Final   Candida auris NOT DETECTED NOT DETECTED Final   Candida glabrata NOT DETECTED NOT DETECTED Final   Candida krusei NOT DETECTED NOT DETECTED Final   Candida parapsilosis NOT DETECTED NOT DETECTED Final   Candida tropicalis NOT DETECTED NOT DETECTED Final   Cryptococcus neoformans/gattii NOT DETECTED NOT DETECTED Final    Comment: Performed at [facility], [address] [place], [place] [address]  SARS CORONAVIRUS 2 (TAT 6-24 HRS) Nasopharyngeal Nasopharyngeal Swab     Status: None   Collection Time: 10/18/20  3:30 PM   Specimen: Nasopharyngeal Swab  Result Value Ref Range Status   SARS Coronavirus 2 NEGATIVE NEGATIVE Final    Comment: (NOTE) SARS-CoV-2 target nucleic acids are NOT DETECTED.  The SARS-CoV-2 RNA is generally detectable in upper and lower respiratory specimens during the acute phase of infection. Negative results do not preclude SARS-CoV-2 infection, do not rule out co-infections with other pathogens, and should not be used as the sole basis for treatment or other patient management decisions. Negative results must be combined with clinical observations, patient history, and epidemiological information. The expected result is Negative.  Fact Sheet for  Patients: SugarRoll.be  Fact Sheet for Healthcare Providers: https://www.woods-mathews.com/  This test is not yet approved or cleared by the [place] FDA and  has been authorized for detection and/or diagnosis of SARS-CoV-2 by FDA under an Emergency Use Authorization (EUA). This EUA will remain  in effect (meaning this test can be used) for the duration of the COVID-19 declaration under Se ction 564(b)(1) of the Act, 21 U.S.C. section 360bbb-3(b)(1), unless the authorization is terminated or revoked sooner.  Performed at [facility], [place] [address] [place], [place] [address]  Radiology Studies: No results found.      Scheduled Meds: . apixaban  2.5 mg Oral BID  . feeding supplement  237 mL Oral TID BM  . metoprolol tartrate  12.5 mg Oral BID  . multivitamin with minerals  1 tablet Oral Daily  . pantoprazole  40 mg Oral Daily  . QUEtiapine  25 mg Oral QHS   Continuous Infusions: . ampicillin-sulbactam (UNASYN) IV    . lactated ringers       LOS: 3 days    Time spent: 28 minutes    Sharen Hones, MD [facility]   To contact the attending provider between 7A-7P or the covering provider during after hours 7P-7A, please log into the web site [email or web address] and access using universal [place] password for that web site. If you do not have the password, please call the hospital operator.  10/21/2020, 1:53 PM

## 2020-10-21 NOTE — TOC Progression Note (Addendum)
Transition of Care [facility]) - Progression Note    Patient Details  Name: [name] MRN: [id number] Date of Birth: 1935-12-19  Transition of Care [facility]) CM/SW [place], RN Phone Number: 10/21/2020, 3:38 PM  Clinical Narrative:    Jeralene Huff out to Magda Paganini at [facility] to make a bed offer, the patient would like to go to [facility], [facility] stated they do not have any beds at this time, reached out to other facilities   Expected Discharge Plan: [place] Barriers to Discharge: Continued Medical Work up  Expected Discharge Plan and Services Expected Discharge Plan: [place] arrangements for the past 2 months: Single Family Home                                       Social Determinants of Health (SDOH) Interventions    Readmission Risk Interventions No flowsheet data found.

## 2020-10-21 NOTE — Care Management Important Message (Signed)
Important Message  Patient Details  Name: [name] MRN: [id number] Date of Birth: Jan 27, 1936   Medicare Important Message Given:  Yes     Dannette Barbara 10/21/2020, 12:09 PM

## 2020-10-21 NOTE — Progress Notes (Signed)
Physical Therapy Treatment Patient Details Name: [name] MRN: [id number] DOB: 02/26/1936 Today's Date: 10/21/2020    History of Present Illness [name] is a 85 y.o. female with medical history significant for breast cancer status post chemo and radiation therapy, nicotine dependence, chronic atrial fibrillation, chronic venous stasis who presents to the ER via EMS for evaluation of sudden onset left leg redness, swelling and differential warmth that started early this morning.  Patient was seen by vascular surgery on 10/10/20 and was placed into Unna boots for her chronic venous stasis with recommendation to change weekly.  Patient stated that she took off the wrap early this morning because she had a lot of drainage from both legs and took a shower.  She states that she did not have the redness when she initially took of the wrap and that after her shower she wrapped up her legs and went back to bed.  She woke up this morning with extensive redness, differential warmth and pain involving her left leg.  She was seen by the home health nurse who recommended she go to the emergency room for further evaluation.    PT Comments    Pt is making gradual progress towards goals. Pt frustrated with RN care describing a pill that she was given "to help me sleep". Concerns relayed to LPN in room. Pt found in bed with soaked linens. Agreeable to perform OOB mobility for linen replacement. CNA in room to assist. Once standing, incontinent episode noted and B LE weeping dripping on floor. Once in recliner, able to participate in seated there-ex safely. Will continue to progress as able.  Follow Up Recommendations  SNF     Equipment Recommendations  None recommended by PT    Recommendations for Other Services       Precautions / Restrictions Precautions Precautions: Fall Restrictions Weight Bearing Restrictions: No    Mobility  Bed Mobility Overal bed mobility: Needs Assistance Bed  Mobility: Supine to Sit     Supine to sit: Min assist     General bed mobility comments: reaches out for hand held assist. Slight assist for trunkal elevation. Able to slide B LE across and over bed    Transfers Overall transfer level: Needs assistance Equipment used: Rolling walker (2 wheeled) Transfers: Sit to/from Stand Sit to Stand: Min assist         General transfer comment: cues for pushing from seated surface. Unsteadiness with post leaning, however once standing, upright posture noted  Ambulation/Gait Ambulation/Gait assistance: Min guard Gait Distance (Feet): 5 Feet Assistive device: Rolling walker (2 wheeled) Gait Pattern/deviations: Step-to pattern     General Gait Details: ambulated over to recliner with safe technique. Fatigues quickly and defers further ambulation. During ambulation, incontience noted and LE weeping onto floor.   Stairs             Wheelchair Mobility    Modified Rankin (Stroke Patients Only)       Balance Overall balance assessment: Needs assistance Sitting-balance support: Feet supported Sitting balance-Leahy Scale: Good     Standing balance support: Bilateral upper extremity supported;During functional activity Standing balance-Leahy Scale: Fair                              Cognition Arousal/Alertness: Awake/alert Behavior During Therapy: WFL for tasks assessed/performed Overall Cognitive Status: Within Functional Limits for tasks assessed  General Comments: sleeping upon arrival, however easily awakens      Exercises Other Exercises Other Exercises: seated ther-ex performed on B LE including AP, quad sets, hip abd/add, and SLRs. Also performed LAQ. All ther-ex performed x 10 reps with cga. Fatigues quickly with rest breaks between each bout of ther-ex    General Comments        Pertinent Vitals/Pain Pain Assessment: Faces Faces Pain Scale: Hurts even  more Pain Location: L LE Pain Descriptors / Indicators: Discomfort;Tightness;Heaviness;Guarding;Grimacing Pain Intervention(s): Limited activity within patient's tolerance;Repositioned    Home Living                      Prior Function            PT Goals (current goals can now be found in the care plan section) Acute Rehab PT Goals Patient Stated Goal: to decrease pain, get legs better PT Goal Formulation: With patient Time For Goal Achievement: 11/01/20 Potential to Achieve Goals: Fair Progress towards PT goals: Progressing toward goals    Frequency    Min 2X/week      PT Plan Current plan remains appropriate    Co-evaluation              AM-PAC PT "6 Clicks" Mobility   Outcome Measure  Help needed turning from your back to your side while in a flat bed without using bedrails?: A Little Help needed moving from lying on your back to sitting on the side of a flat bed without using bedrails?: A Lot Help needed moving to and from a bed to a chair (including a wheelchair)?: A Little Help needed standing up from a chair using your arms (e.g., wheelchair or bedside chair)?: A Little Help needed to walk in hospital room?: A Lot Help needed climbing 3-5 steps with a railing? : A Lot 6 Click Score: 15    End of Session Equipment Utilized During Treatment: Gait belt Activity Tolerance: Patient limited by pain Patient left: in chair;with call bell/phone within reach;with chair alarm set Nurse Communication: Mobility status PT Visit Diagnosis: Other abnormalities of gait and mobility (R26.89);Muscle weakness (generalized) (M62.81);Pain;Difficulty in walking, not elsewhere classified (R26.2) Pain - Right/Left: Left Pain - part of body: Leg     Time: 7169-6789 PT Time Calculation (min) (ACUTE ONLY): 32 min  Charges:  $Gait Training: 8-22 mins $Therapeutic Exercise: 8-22 mins                     Greggory Stallion, PT,  DPT [phone]    Rafel Garde 10/21/2020, 4:19 PM

## 2020-10-21 NOTE — Progress Notes (Addendum)
Pharmacy Antibiotic Note  [name] is a 85 y.o. female admitted on 10/18/2020 with cellulitis and possible Acinetobacter Junii bacteremia.  Pharmacy has been consulted for ampicillin/sulbactam dosing. Patient currently on piperacillin/tazobactam, but since blood culture set drawn 5/20 with 1 of 2 bottles with GNR,  A. Junii. May represent contaminant, can be seen in nosocomial infections (central line infections) or opportunistic organism seen in immunocompromised patients or frequent hospitalizations or increased antibiotic exposure.    Today, 10/21/2020 Day #4 antibiotics, Day #3 effective therapy for acinetobacter   Renal Function stable  Afebrile  WBC WNL   Plan:  Change pip/tazo to ampicillin/sulbactam 3gm IV q12h based on current renal function  Follow renal function and clinical signs of infection/improvement  Once appropriate for PO antibiotic, consider cipro 500mg  daily and amoxicillin/clavulonate 500mg  or amoxicillin 500mg   PO BID per current renal function. Amox/clav is not active against acinetobacter (sulbactam is active against acinetobacter)  Height: 5\' 8"  (172.7 cm) Weight: 76.8 kg (169 lb 6.4 oz) IBW/kg (Calculated) : 63.9  Temp (24hrs), Avg:98 F (36.7 C), Min:97.5 F (36.4 C), Max:98.6 F (37 C)  Recent Labs  Lab 10/18/20 1349 10/18/20 1927 10/18/20 2232 10/19/20 0151 10/20/20 0918 10/20/20 1116 10/21/20 0558  WBC 8.5  --   --  5.8  --  9.1 7.2  CREATININE 2.21*  --   --  2.11* 2.06*  --  2.05*  LATICACIDVEN 3.3* 4.5* 3.9* 3.4*  --   --   --     Estimated Creatinine Clearance: 22.7 mL/min (A) (by C-G formula based on SCr of 2.05 mg/dL (H)).    Allergies  Allergen Reactions  . Gabapentin Itching  . Ivp Dye [Iodinated Diagnostic Agents] Itching  . Metrizamide Itching    Antimicrobials this admission: 5/20 Ceftriaxone x1 5/20 cefazollin >> 5/21 5/21 pip/tzo >> 5/23 5/23 amp/sulb  Dose adjustments this admission:   Microbiology  results: 5/20 BCx (single):  1 of 2 bottles GNR, A junii   Thank you for allowing pharmacy to be a part of this patient's care.  Doreene Eland, PharmD, BCPS.   Work Cell: [phone] 10/21/2020 10:18 AM

## 2020-10-22 LAB — CBC WITH DIFFERENTIAL/PLATELET
Abs Immature Granulocytes: 0.17 10*3/uL — ABNORMAL HIGH (ref 0.00–0.07)
Basophils Absolute: 0 10*3/uL (ref 0.0–0.1)
Basophils Relative: 0 %
Eosinophils Absolute: 0.3 10*3/uL (ref 0.0–0.5)
Eosinophils Relative: 3 %
HCT: 36.7 % (ref 36.0–46.0)
Hemoglobin: 12 g/dL (ref 12.0–15.0)
Immature Granulocytes: 2 %
Lymphocytes Relative: 13 %
Lymphs Abs: 1.2 10*3/uL (ref 0.7–4.0)
MCH: 32.1 pg (ref 26.0–34.0)
MCHC: 32.7 g/dL (ref 30.0–36.0)
MCV: 98.1 fL (ref 80.0–100.0)
Monocytes Absolute: 0.8 10*3/uL (ref 0.1–1.0)
Monocytes Relative: 9 %
Neutro Abs: 7.2 10*3/uL (ref 1.7–7.7)
Neutrophils Relative %: 73 %
Platelets: 185 10*3/uL (ref 150–400)
RBC: 3.74 MIL/uL — ABNORMAL LOW (ref 3.87–5.11)
RDW: 18.5 % — ABNORMAL HIGH (ref 11.5–15.5)
WBC: 9.7 10*3/uL (ref 4.0–10.5)
nRBC: 0 % (ref 0.0–0.2)

## 2020-10-22 LAB — RESP PANEL BY RT-PCR (FLU A&B, COVID) ARPGX2
Influenza A by PCR: NEGATIVE
Influenza B by PCR: NEGATIVE
SARS Coronavirus 2 by RT PCR: NEGATIVE

## 2020-10-22 LAB — BASIC METABOLIC PANEL
Anion gap: 11 (ref 5–15)
BUN: 61 mg/dL — ABNORMAL HIGH (ref 8–23)
CO2: 24 mmol/L (ref 22–32)
Calcium: 7.6 mg/dL — ABNORMAL LOW (ref 8.9–10.3)
Chloride: 102 mmol/L (ref 98–111)
Creatinine, Ser: 1.98 mg/dL — ABNORMAL HIGH (ref 0.44–1.00)
GFR, Estimated: 25 mL/min — ABNORMAL LOW (ref >60)
Glucose, Bld: 80 mg/dL (ref 70–99)
Potassium: 3.8 mmol/L (ref 3.5–5.1)
Sodium: 137 mmol/L (ref 135–145)

## 2020-10-22 LAB — MAGNESIUM: Magnesium: 1.9 mg/dL (ref 1.7–2.4)

## 2020-10-22 MED ORDER — CIPROFLOXACIN HCL 250 MG PO TABS: 250.0000 mg | ORAL_TABLET | Freq: Two times a day (BID) | ORAL | 0 refills | Status: AC

## 2020-10-22 MED ORDER — AMOXICILLIN-POT CLAVULANATE 500-125 MG PO TABS: 1.0000 | ORAL_TABLET | Freq: Two times a day (BID) | ORAL | 0 refills | Status: AC

## 2020-10-22 MED ORDER — QUETIAPINE FUMARATE 25 MG PO TABS: 25.0000 mg | ORAL_TABLET | Freq: Every day | ORAL | Status: AC

## 2020-10-22 MED ORDER — APIXABAN 2.5 MG PO TABS: 2.5000 mg | ORAL_TABLET | Freq: Two times a day (BID) | ORAL | 0 refills | Status: AC

## 2020-10-22 NOTE — TOC Progression Note (Signed)
Transition of Care [facility]) - Progression Note    Patient Details  Name: [name] MRN: [id number] Date of Birth: December 11, 1935  Transition of [facility]) CM/SW [place], RN Phone Number: 10/22/2020, 10:30 AM  Clinical Narrative:    Received notifiecation that insurance was approved, [id number] start date 5/24 next review 5/26, notified [facility]   Expected Discharge Plan: [place] Barriers to Discharge: Continued Medical Work up  Expected Discharge Plan and Services Expected Discharge Plan: [place]       Living arrangements for the past 2 months: Single Family Home                                       Social Determinants of Health (SDOH) Interventions    Readmission Risk Interventions No flowsheet data found.

## 2020-10-22 NOTE — TOC Progression Note (Signed)
Transition of Care [facility]) - Progression Note    Patient Details  Name: [name] MRN: [id number] Date of Birth: 30-May-1936  Transition of Care [facility]) CM/SW [place], RN Phone Number: 10/22/2020, 10:35 AM  Clinical Narrative:   Spoke with the patient and notified her that we have received ins approval to go to [facility], She asked that I call Loniie at [phone], I called and spoke with [name], I explained that she will DC to [facility] today    Expected Discharge Plan: [place] Barriers to Discharge: Continued Medical Work up  Expected Discharge Plan and Services Expected Discharge Plan: [place] arrangements for the past 2 months: Single Family Home                                       Social Determinants of Health (SDOH) Interventions    Readmission Risk Interventions No flowsheet data found.

## 2020-10-22 NOTE — Plan of Care (Signed)
  Problem: Education: Goal: Knowledge of General Education information will improve Description: Including pain rating scale, medication(s)/side effects and non-pharmacologic comfort measures 10/22/2020 1100 by Conard Novak, RN Outcome: Adequate for Discharge 10/22/2020 1100 by Conard Novak, RN Outcome: Adequate for Discharge   Problem: Health Behavior/Discharge Planning: Goal: Ability to manage health-related needs will improve 10/22/2020 1100 by Conard Novak, RN Outcome: Adequate for Discharge 10/22/2020 1100 by Conard Novak, RN Outcome: Adequate for Discharge   Problem: Clinical Measurements: Goal: Ability to maintain clinical measurements within normal limits will improve 10/22/2020 1100 by Conard Novak, RN Outcome: Adequate for Discharge 10/22/2020 1100 by Conard Novak, RN Outcome: Adequate for Discharge Goal: Will remain free from infection 10/22/2020 1100 by Conard Novak, RN Outcome: Adequate for Discharge 10/22/2020 1100 by Conard Novak, RN Outcome: Adequate for Discharge Goal: Diagnostic test results will improve 10/22/2020 1100 by Conard Novak, RN Outcome: Adequate for Discharge 10/22/2020 1100 by Conard Novak, RN Outcome: Adequate for Discharge Goal: Respiratory complications will improve 10/22/2020 1100 by Conard Novak, RN Outcome: Adequate for Discharge 10/22/2020 1100 by Conard Novak, RN Outcome: Adequate for Discharge Goal: Cardiovascular complication will be avoided 10/22/2020 1100 by Conard Novak, RN Outcome: Adequate for Discharge 10/22/2020 1100 by Conard Novak, RN Outcome: Adequate for Discharge   Problem: Activity: Goal: Risk for activity intolerance will decrease 10/22/2020 1100 by Conard Novak, RN Outcome: Adequate for Discharge 10/22/2020 1100 by Conard Novak, RN Outcome: Adequate for Discharge   Problem: Nutrition: Goal: Adequate nutrition will be maintained 10/22/2020 1100 by  Conard Novak, RN Outcome: Adequate for Discharge 10/22/2020 1100 by Conard Novak, RN Outcome: Adequate for Discharge   Problem: Coping: Goal: Level of anxiety will decrease 10/22/2020 1100 by Conard Novak, RN Outcome: Adequate for Discharge 10/22/2020 1100 by Conard Novak, RN Outcome: Adequate for Discharge   Problem: Elimination: Goal: Will not experience complications related to bowel motility 10/22/2020 1100 by Conard Novak, RN Outcome: Adequate for Discharge 10/22/2020 1100 by Conard Novak, RN Outcome: Adequate for Discharge Goal: Will not experience complications related to urinary retention 10/22/2020 1100 by Conard Novak, RN Outcome: Adequate for Discharge 10/22/2020 1100 by Conard Novak, RN Outcome: Adequate for Discharge   Problem: Pain Managment: Goal: General experience of comfort will improve 10/22/2020 1100 by Conard Novak, RN Outcome: Adequate for Discharge 10/22/2020 1100 by Conard Novak, RN Outcome: Adequate for Discharge   Problem: Safety: Goal: Ability to remain free from injury will improve 10/22/2020 1100 by Conard Novak, RN Outcome: Adequate for Discharge 10/22/2020 1100 by Conard Novak, RN Outcome: Adequate for Discharge   Problem: Skin Integrity: Goal: Risk for impaired skin integrity will decrease 10/22/2020 1100 by Conard Novak, RN Outcome: Adequate for Discharge 10/22/2020 1100 by Conard Novak, RN Outcome: Adequate for Discharge   Problem: Acute Rehab PT Goals(only PT should resolve) Goal: Pt Will Go Supine/Side To Sit Outcome: Adequate for Discharge Goal: Pt Will Go Sit To Supine/Side Outcome: Adequate for Discharge Goal: Patient Will Transfer Sit To/From Stand Outcome: Adequate for Discharge Goal: Pt Will Ambulate Outcome: Adequate for Discharge Goal: Pt Will Go Up/Down Stairs Outcome: Adequate for Discharge   Problem: Increased Nutrient Needs (NI-5.1) Goal: Food and/or  nutrient delivery Description: Individualized approach for food/nutrient provision. Outcome: Adequate for Discharge

## 2020-10-22 NOTE — Discharge Summary (Addendum)
Physician Discharge Summary  Patient ID: [name] MRN: [id number] DOB/AGE: Apr 01, 1936 85 y.o.  Admit date: 10/18/2020 Discharge date: 10/22/2020  Admission Diagnoses:  Discharge Diagnoses:  Principal Problem:   Left leg cellulitis Active Problems:   Severe sepsis ([place])   Chronic a-fib ([facility])   NASH (nonalcoholic steatohepatitis)   Venous ulcer of ankle ([place])   AKI (acute kidney injury) [facility])   Discharged Condition: fair  Hospital Course:  [name] a 85 y.o.femalewith medical history significant forbreast cancer status post chemo and radiation therapy, nicotine dependence, chronic atrial fibrillation, chronic venous stasis who presents to the ER via EMS for evaluation of sudden onset left leg redness, swelling and differential warmth that started early morning5/20. She initially placed on cefazolin for cellulitis. Blood cultures on 5/21 positive for gram-negative rods, antibiotic changed to Zosyn.  #1. Severe sepsis with septic shock secondary to cellulitis due to Acinetobacter Junii Left leg cellulitis secondary to Acinetobacter Junii Acute kidney injury on stage IV chronic kidney disease secondary to sepsis.ATN Metabolic acidosis. Lactic acidosis. Patient currently is hemodynamically stable.  Blood culture came back with Acinetobacter Junii.  Only had a 1 blood draw, not sure this is real or contaminant.    She is treated with Zosyn then changed to Unasyn. At discharge, I will treat her with Augmentin (7 days for cellulitis) plus Cipro(10 days for possible bacteremia). Patient renal function is improving, metabolic acidosis has resolved.   #2.  Chronic atrial fibrillation. Continue Eliquis.  But reduce the dose by adjusted to renal function  3.  Liver cirrhosis secondary to [place]. Diuretics on hold.   4.  Hypomagnesemia Improved.  5. Pressure ulcers. POA. Pressure Injury 10/18/20 Buttocks Mid;Medial Stage 2 -  Partial thickness loss of dermis  presenting as a shallow open injury with a red, pink wound bed without slough. circular, pink open area (Active)  10/18/20 2200  Location: Buttocks  Location Orientation: Mid;Medial  Staging: Stage 2 -  Partial thickness loss of dermis presenting as a shallow open injury with a red, pink wound bed without slough.  Wound Description (Comments): circular, pink open area  Present on Admission: Yes        Consults: None  Significant Diagnostic Studies:   BILATERAL LOWER EXTREMITY VENOUS DOPPLER ULTRASOUND  TECHNIQUE: Gray-scale sonography with graded compression, as well as color Doppler and duplex ultrasound were performed to evaluate the lower extremity deep venous systems from the level of the common femoral vein and including the common femoral, femoral, profunda femoral, popliteal and calf veins including the posterior tibial, peroneal and gastrocnemius veins when visible. The superficial great saphenous vein was also interrogated. Spectral Doppler was utilized to evaluate flow at rest and with distal augmentation maneuvers in the common femoral, femoral and popliteal veins.  COMPARISON:  None.  FINDINGS: RIGHT LOWER EXTREMITY  Common Femoral Vein: No evidence of thrombus. Normal compressibility, respiratory phasicity and response to augmentation.  Saphenofemoral Junction: No evidence of thrombus. Normal compressibility and flow on color Doppler imaging.  Profunda Femoral Vein: No evidence of thrombus. Normal compressibility and flow on color Doppler imaging.  Femoral Vein: No evidence of thrombus. Normal compressibility, respiratory phasicity and response to augmentation.  Popliteal Vein: No evidence of thrombus. Normal compressibility, respiratory phasicity and response to augmentation.  Calf Veins: No evidence of thrombus. Normal compressibility and flow on color Doppler imaging.  Superficial Great Saphenous Vein: No evidence of thrombus.  Normal compressibility.  Venous Reflux:  None.  Other Findings:  None.  LEFT  LOWER EXTREMITY  Common Femoral Vein: No evidence of thrombus. Normal compressibility, respiratory phasicity and response to augmentation.  Saphenofemoral Junction: No evidence of thrombus. Normal compressibility and flow on color Doppler imaging.  Profunda Femoral Vein: No evidence of thrombus. Normal compressibility and flow on color Doppler imaging.  Femoral Vein: No evidence of thrombus. Normal compressibility, respiratory phasicity and response to augmentation.  Popliteal Vein: No evidence of thrombus. Normal compressibility, respiratory phasicity and response to augmentation.  Calf Veins: No evidence of thrombus. Normal compressibility and flow on color Doppler imaging.  Superficial Great Saphenous Vein: No evidence of thrombus. Normal compressibility.  Venous Reflux:  None.  Other Findings:  None.  IMPRESSION: No evidence of deep venous thrombosis in either lower extremity.   Electronically Signed   By: Jacqulynn Cadet M.D.   On: 10/18/2020 13:55    Treatments: Zosyn  Discharge Exam: Blood pressure (!) 140/94, pulse 60, temperature (!) 97.5 F (36.4 C), temperature source Oral, resp. rate 14, height 5\' 8"  (1.727 m), weight 76.8 kg, SpO2 98 %. General appearance: alert and cooperative Resp: clear to auscultation bilaterally Cardio: regular rate and rhythm, S1, S2 normal, no murmur, click, rub or gallop GI: soft, non-tender; bowel sounds normal; no masses,  no organomegaly Extremities: Bilateral leg chronic edema, left leg redness much improved.  Disposition: Discharge disposition: 03-Skilled Nursing Facility       Discharge Instructions    AMB referral to wound care center   Complete by: As directed    Diet - low sodium heart healthy   Complete by: As directed    Discharge wound care:   Complete by: As directed    RN to care. Refer to wc   Increase  activity slowly   Complete by: As directed      Allergies as of 10/22/2020      Reactions   Gabapentin Itching   Ivp Dye [iodinated Diagnostic Agents] Itching   Metrizamide Itching      Medication List    STOP taking these medications   furosemide 40 MG tablet Commonly known as: Lasix   metoprolol succinate 25 MG 24 hr tablet Commonly known as: TOPROL-XL   potassium chloride 10 MEQ tablet Commonly known as: KLOR-CON   spironolactone 25 MG tablet Commonly known as: ALDACTONE     TAKE these medications   acetaminophen 500 MG tablet Commonly known as: TYLENOL Take 1,000 mg by mouth as needed.   amoxicillin-clavulanate 500-125 MG tablet Commonly known as: Augmentin Take 1 tablet (500 mg total) by mouth in the morning and at bedtime for 7 days.   apixaban 2.5 MG Tabs tablet Commonly known as: ELIQUIS Take 1 tablet (2.5 mg total) by mouth 2 (two) times daily. What changed:   medication strength  how much to take   ciprofloxacin 250 MG tablet Commonly known as: CIPRO Take 1 tablet (250 mg total) by mouth 2 (two) times daily for 10 days.   metoprolol tartrate 25 MG tablet Commonly known as: LOPRESSOR Take 0.5 tablets (12.5 mg total) by mouth 2 (two) times daily.   pantoprazole 40 MG tablet Commonly known as: PROTONIX Take 1 tablet (40 mg total) by mouth daily.   QUEtiapine 25 MG tablet Commonly known as: SEROQUEL Take 1 tablet (25 mg total) by mouth at bedtime.            Discharge Care Instructions  (From admission, onward)         Start     Ordered   10/22/20 0000  Discharge wound care:       Comments: RN to care. Refer to wc   10/22/20 1046          Contact information for follow-up providers    Gauger, Victoriano Lain, NP Follow up in 1 week(s).   Specialty: Internal Medicine Contact information: [place] [address] [phone]            Contact information for after-discharge care    Destination    [facility] Preferred SNF .   Service: Skilled Nursing Contact information: [address] [place] [phone]                35 minutes  Signed: Sharen Hones 10/22/2020, 10:46 AM

## 2020-10-22 NOTE — Progress Notes (Signed)
Report called to Maudie Mercury at [facility].  Transport arranged for 4:30-5pm.

## 2020-11-18 ENCOUNTER — Inpatient Hospital Stay: Payer: Medicare Other

## 2020-11-18 ENCOUNTER — Emergency Department: Payer: Medicare Other

## 2020-11-18 ENCOUNTER — Other Ambulatory Visit: Payer: Self-pay

## 2020-11-18 ENCOUNTER — Inpatient Hospital Stay
Admission: EM | Admit: 2020-11-18 | Discharge: 2020-11-29 | DRG: 871 | Disposition: E | Payer: Medicare Other | Source: Skilled Nursing Facility | Attending: Internal Medicine | Admitting: Internal Medicine

## 2020-11-18 DIAGNOSIS — K7469 Other cirrhosis of liver: Secondary | ICD-10-CM | POA: Diagnosis present

## 2020-11-18 DIAGNOSIS — R630 Anorexia: Secondary | ICD-10-CM | POA: Diagnosis present

## 2020-11-18 DIAGNOSIS — Z7901 Long term (current) use of anticoagulants: Secondary | ICD-10-CM

## 2020-11-18 DIAGNOSIS — K219 Gastro-esophageal reflux disease without esophagitis: Secondary | ICD-10-CM | POA: Diagnosis present

## 2020-11-18 DIAGNOSIS — R6521 Severe sepsis with septic shock: Secondary | ICD-10-CM | POA: Diagnosis present

## 2020-11-18 DIAGNOSIS — R7989 Other specified abnormal findings of blood chemistry: Secondary | ICD-10-CM | POA: Diagnosis present

## 2020-11-18 DIAGNOSIS — Z720 Tobacco use: Secondary | ICD-10-CM | POA: Diagnosis not present

## 2020-11-18 DIAGNOSIS — Z961 Presence of intraocular lens: Secondary | ICD-10-CM | POA: Diagnosis present

## 2020-11-18 DIAGNOSIS — Z9842 Cataract extraction status, left eye: Secondary | ICD-10-CM

## 2020-11-18 DIAGNOSIS — E785 Hyperlipidemia, unspecified: Secondary | ICD-10-CM | POA: Diagnosis present

## 2020-11-18 DIAGNOSIS — Z66 Do not resuscitate: Secondary | ICD-10-CM | POA: Diagnosis present

## 2020-11-18 DIAGNOSIS — N179 Acute kidney failure, unspecified: Secondary | ICD-10-CM | POA: Diagnosis not present

## 2020-11-18 DIAGNOSIS — N170 Acute kidney failure with tubular necrosis: Secondary | ICD-10-CM | POA: Diagnosis present

## 2020-11-18 DIAGNOSIS — Z20822 Contact with and (suspected) exposure to covid-19: Secondary | ICD-10-CM | POA: Diagnosis present

## 2020-11-18 DIAGNOSIS — Z85828 Personal history of other malignant neoplasm of skin: Secondary | ICD-10-CM

## 2020-11-18 DIAGNOSIS — I878 Other specified disorders of veins: Secondary | ICD-10-CM | POA: Diagnosis present

## 2020-11-18 DIAGNOSIS — G9341 Metabolic encephalopathy: Secondary | ICD-10-CM | POA: Diagnosis present

## 2020-11-18 DIAGNOSIS — Z79899 Other long term (current) drug therapy: Secondary | ICD-10-CM

## 2020-11-18 DIAGNOSIS — Z9841 Cataract extraction status, right eye: Secondary | ICD-10-CM

## 2020-11-18 DIAGNOSIS — I214 Non-ST elevation (NSTEMI) myocardial infarction: Secondary | ICD-10-CM | POA: Diagnosis present

## 2020-11-18 DIAGNOSIS — Z853 Personal history of malignant neoplasm of breast: Secondary | ICD-10-CM | POA: Diagnosis not present

## 2020-11-18 DIAGNOSIS — R6520 Severe sepsis without septic shock: Secondary | ICD-10-CM | POA: Diagnosis not present

## 2020-11-18 DIAGNOSIS — I2510 Atherosclerotic heart disease of native coronary artery without angina pectoris: Secondary | ICD-10-CM | POA: Diagnosis present

## 2020-11-18 DIAGNOSIS — R778 Other specified abnormalities of plasma proteins: Secondary | ICD-10-CM | POA: Diagnosis not present

## 2020-11-18 DIAGNOSIS — Z6835 Body mass index (BMI) 35.0-35.9, adult: Secondary | ICD-10-CM

## 2020-11-18 DIAGNOSIS — Z888 Allergy status to other drugs, medicaments and biological substances status: Secondary | ICD-10-CM

## 2020-11-18 DIAGNOSIS — R079 Chest pain, unspecified: Secondary | ICD-10-CM | POA: Diagnosis present

## 2020-11-18 DIAGNOSIS — Z9221 Personal history of antineoplastic chemotherapy: Secondary | ICD-10-CM

## 2020-11-18 DIAGNOSIS — N184 Chronic kidney disease, stage 4 (severe): Secondary | ICD-10-CM | POA: Diagnosis present

## 2020-11-18 DIAGNOSIS — N390 Urinary tract infection, site not specified: Secondary | ICD-10-CM | POA: Diagnosis present

## 2020-11-18 DIAGNOSIS — E782 Mixed hyperlipidemia: Secondary | ICD-10-CM | POA: Diagnosis present

## 2020-11-18 DIAGNOSIS — F17210 Nicotine dependence, cigarettes, uncomplicated: Secondary | ICD-10-CM | POA: Diagnosis present

## 2020-11-18 DIAGNOSIS — A419 Sepsis, unspecified organism: Secondary | ICD-10-CM | POA: Diagnosis present

## 2020-11-18 DIAGNOSIS — Z515 Encounter for palliative care: Secondary | ICD-10-CM | POA: Diagnosis not present

## 2020-11-18 DIAGNOSIS — I482 Chronic atrial fibrillation, unspecified: Secondary | ICD-10-CM | POA: Diagnosis present

## 2020-11-18 DIAGNOSIS — R197 Diarrhea, unspecified: Secondary | ICD-10-CM | POA: Diagnosis present

## 2020-11-18 DIAGNOSIS — K7581 Nonalcoholic steatohepatitis (NASH): Secondary | ICD-10-CM | POA: Diagnosis present

## 2020-11-18 DIAGNOSIS — Z923 Personal history of irradiation: Secondary | ICD-10-CM

## 2020-11-18 DIAGNOSIS — I4510 Unspecified right bundle-branch block: Secondary | ICD-10-CM | POA: Diagnosis present

## 2020-11-18 DIAGNOSIS — F0390 Unspecified dementia without behavioral disturbance: Secondary | ICD-10-CM | POA: Diagnosis present

## 2020-11-18 DIAGNOSIS — R570 Cardiogenic shock: Secondary | ICD-10-CM | POA: Diagnosis present

## 2020-11-18 DIAGNOSIS — Z91041 Radiographic dye allergy status: Secondary | ICD-10-CM

## 2020-11-18 LAB — TROPONIN I (HIGH SENSITIVITY)
Troponin I (High Sensitivity): 42 ng/L — ABNORMAL HIGH (ref <18)
Troponin I (High Sensitivity): 332 ng/L (ref <18)
Troponin I (High Sensitivity): 1565 ng/L (ref <18)

## 2020-11-18 LAB — CBC WITH DIFFERENTIAL/PLATELET
Abs Immature Granulocytes: 0.33 10*3/uL — ABNORMAL HIGH (ref 0.00–0.07)
Basophils Absolute: 0 10*3/uL (ref 0.0–0.1)
Basophils Relative: 0 %
Eosinophils Absolute: 0.1 10*3/uL (ref 0.0–0.5)
Eosinophils Relative: 1 %
HCT: 35.3 % — ABNORMAL LOW (ref 36.0–46.0)
Hemoglobin: 11.4 g/dL — ABNORMAL LOW (ref 12.0–15.0)
Immature Granulocytes: 3 %
Lymphocytes Relative: 18 %
Lymphs Abs: 2.3 10*3/uL (ref 0.7–4.0)
MCH: 32.4 pg (ref 26.0–34.0)
MCHC: 32.3 g/dL (ref 30.0–36.0)
MCV: 100.3 fL — ABNORMAL HIGH (ref 80.0–100.0)
Monocytes Absolute: 0.8 10*3/uL (ref 0.1–1.0)
Monocytes Relative: 6 %
Neutro Abs: 9 10*3/uL — ABNORMAL HIGH (ref 1.7–7.7)
Neutrophils Relative %: 72 %
Platelets: 300 10*3/uL (ref 150–400)
RBC: 3.52 MIL/uL — ABNORMAL LOW (ref 3.87–5.11)
RDW: 18.5 % — ABNORMAL HIGH (ref 11.5–15.5)
WBC: 12.5 10*3/uL — ABNORMAL HIGH (ref 4.0–10.5)
nRBC: 0 % (ref 0.0–0.2)

## 2020-11-18 LAB — URINALYSIS, COMPLETE (UACMP) WITH MICROSCOPIC
Bilirubin Urine: NEGATIVE
Glucose, UA: NEGATIVE mg/dL
Ketones, ur: NEGATIVE mg/dL
Nitrite: NEGATIVE
Protein, ur: 100 mg/dL — AB
RBC / HPF: >50 RBC/hpf — ABNORMAL HIGH (ref 0–5)
Specific Gravity, Urine: 1.013 (ref 1.005–1.030)
WBC, UA: >50 WBC/hpf — ABNORMAL HIGH (ref 0–5)
pH: 5 (ref 5.0–8.0)

## 2020-11-18 LAB — APTT: aPTT: 37 seconds — ABNORMAL HIGH (ref 24–36)

## 2020-11-18 LAB — HEMOGLOBIN A1C
Hgb A1c MFr Bld: 4.4 % — ABNORMAL LOW (ref 4.8–5.6)
Mean Plasma Glucose: 80 mg/dL

## 2020-11-18 LAB — HEPARIN LEVEL (UNFRACTIONATED): Heparin Unfractionated: >1.1 IU/mL — ABNORMAL HIGH (ref 0.30–0.70)

## 2020-11-18 LAB — LACTIC ACID, PLASMA
Lactic Acid, Venous: 3.5 mmol/L (ref 0.5–1.9)
Lactic Acid, Venous: 6.5 mmol/L (ref 0.5–1.9)
Lactic Acid, Venous: 9.4 mmol/L (ref 0.5–1.9)
Lactic Acid, Venous: 10.8 mmol/L (ref 0.5–1.9)

## 2020-11-18 LAB — BASIC METABOLIC PANEL
Anion gap: 12 (ref 5–15)
BUN: 88 mg/dL — ABNORMAL HIGH (ref 8–23)
CO2: 13 mmol/L — ABNORMAL LOW (ref 22–32)
Calcium: 8 mg/dL — ABNORMAL LOW (ref 8.9–10.3)
Chloride: 112 mmol/L — ABNORMAL HIGH (ref 98–111)
Creatinine, Ser: 3.29 mg/dL — ABNORMAL HIGH (ref 0.44–1.00)
GFR, Estimated: 13 mL/min — ABNORMAL LOW (ref >60)
Glucose, Bld: 94 mg/dL (ref 70–99)
Potassium: 4.7 mmol/L (ref 3.5–5.1)
Sodium: 137 mmol/L (ref 135–145)

## 2020-11-18 LAB — PROCALCITONIN: Procalcitonin: 0.23 ng/mL

## 2020-11-18 LAB — RESP PANEL BY RT-PCR (FLU A&B, COVID) ARPGX2
Influenza A by PCR: NEGATIVE
Influenza B by PCR: NEGATIVE
SARS Coronavirus 2 by RT PCR: NEGATIVE

## 2020-11-18 LAB — AMMONIA: Ammonia: 13 umol/L (ref 9–35)

## 2020-11-18 LAB — PROTIME-INR
INR: 1.7 — ABNORMAL HIGH (ref 0.8–1.2)
Prothrombin Time: 19.9 seconds — ABNORMAL HIGH (ref 11.4–15.2)

## 2020-11-18 LAB — MAGNESIUM: Magnesium: 1.8 mg/dL (ref 1.7–2.4)

## 2020-11-18 MED ORDER — ONDANSETRON HCL 4 MG/2ML IJ SOLN: 4.0000 mg | Freq: Three times a day (TID) | INTRAMUSCULAR | Status: DC | PRN

## 2020-11-18 MED ORDER — SODIUM CHLORIDE 0.9 % IV SOLN
1.0000 g | INTRAVENOUS | Status: DC
  Administered 2020-11-18: 1 g via INTRAVENOUS
  Filled 2020-11-18: qty 10

## 2020-11-18 MED ORDER — NOREPINEPHRINE 4 MG/250ML-% IV SOLN: 0.0000 ug/min | INTRAVENOUS | Status: DC

## 2020-11-18 MED ORDER — ATORVASTATIN CALCIUM 20 MG PO TABS: 20.0000 mg | ORAL_TABLET | Freq: Every day | ORAL | Status: DC

## 2020-11-18 MED ORDER — LACTATED RINGERS IV SOLN: INTRAVENOUS | Status: DC

## 2020-11-18 MED ORDER — LACTATED RINGERS IV BOLUS
1000.0000 mL | Freq: Once | INTRAVENOUS | Status: AC
  Administered 2020-11-18: 1000 mL via INTRAVENOUS

## 2020-11-18 MED ORDER — ACETAMINOPHEN 325 MG PO TABS: 650.0000 mg | ORAL_TABLET | Freq: Four times a day (QID) | ORAL | Status: DC | PRN

## 2020-11-18 MED ORDER — SODIUM CHLORIDE 0.9 % IV SOLN: Freq: Once | INTRAVENOUS | Status: AC

## 2020-11-18 MED ORDER — METOPROLOL TARTRATE 25 MG PO TABS: 25.0000 mg | ORAL_TABLET | Freq: Once | ORAL | Status: DC

## 2020-11-18 MED ORDER — NICOTINE 21 MG/24HR TD PT24: 21.0000 mg | MEDICATED_PATCH | Freq: Every day | TRANSDERMAL | Status: DC

## 2020-11-18 MED ORDER — HEPARIN BOLUS VIA INFUSION
2000.0000 [IU] | Freq: Once | INTRAVENOUS | Status: AC
  Administered 2020-11-18: 2000 [IU] via INTRAVENOUS
  Filled 2020-11-18: qty 2000

## 2020-11-18 MED ORDER — OXYCODONE-ACETAMINOPHEN 5-325 MG PO TABS
1.0000 | ORAL_TABLET | ORAL | Status: DC | PRN
Start: 2020-11-18 — End: 2020-11-19

## 2020-11-18 MED ORDER — MORPHINE SULFATE (PF) 2 MG/ML IV SOLN
INTRAVENOUS | Status: AC
  Administered 2020-11-18: 2 mg via INTRAVENOUS
  Filled 2020-11-18: qty 1

## 2020-11-18 MED ORDER — HEPARIN (PORCINE) 25000 UT/250ML-% IV SOLN
900.0000 [IU]/h | INTRAVENOUS | Status: DC
  Administered 2020-11-18: 900 [IU]/h via INTRAVENOUS
  Filled 2020-11-18: qty 250

## 2020-11-18 MED ORDER — NOREPINEPHRINE 4 MG/250ML-% IV SOLN
0.0000 ug/min | INTRAVENOUS | Status: DC
  Administered 2020-11-18: 2 ug/min via INTRAVENOUS

## 2020-11-18 MED ORDER — MORPHINE SULFATE (PF) 2 MG/ML IV SOLN
2.0000 mg | Freq: Once | INTRAVENOUS | Status: AC
  Administered 2020-11-18: 2 mg via INTRAVENOUS
  Filled 2020-11-18: qty 1

## 2020-11-18 MED ORDER — SODIUM CHLORIDE 0.9 % IV BOLUS: 1000.0000 mL | Freq: Once | INTRAVENOUS | Status: DC

## 2020-11-18 MED ORDER — MORPHINE SULFATE (PF) 2 MG/ML IV SOLN: 1.0000 mg | INTRAVENOUS | Status: DC | PRN

## 2020-11-18 MED ORDER — LACTATED RINGERS IV BOLUS (SEPSIS)
1000.0000 mL | Freq: Once | INTRAVENOUS | Status: AC
  Administered 2020-11-18: 1000 mL via INTRAVENOUS

## 2020-11-18 NOTE — ED Notes (Signed)
HR briefly read "0" on monitor yet pt's eyes open and she is tracking this RN. Asked family if they would prefer this RN silence monitor by placing it on comfort care mode or leave it as is; family stated they'd rather it be left on so they can continue to watch it.

## 2020-11-18 NOTE — ED Notes (Signed)
Pt bed sheets, pad, brief changed. No BM noted in brief.

## 2020-11-18 NOTE — ED Notes (Signed)
Peripheral IV has infiltrated.  Unable to establish IV access.  Dr. Milagros Loll alerted.  IV team consult placed.

## 2020-11-18 NOTE — ED Notes (Signed)
Provider at bedside

## 2020-11-18 NOTE — Sepsis Progress Note (Signed)
Sepsis protocol being followed by eLink 

## 2020-11-18 NOTE — ED Notes (Signed)
LR previously running at 100cc/hr temporarily inc to 999cc/hr. Contacting attending provider. Lorriane Shire to check manual BP soon.

## 2020-11-18 NOTE — ED Notes (Signed)
Provider Mansy notified via secure chat that family wants to start pt on pain meds for comfort.

## 2020-11-18 NOTE — ED Notes (Signed)
Night coverage provider Mansy notified of pt's automatic BP reading low and attempts to complete manual BP. Pt given more warm blankets. Visitors remain at bedside. Awaiting orders.

## 2020-11-18 NOTE — ED Notes (Signed)
Lab personnel state lab equipment "went down" as they were running samples earlier. State repeat trop should result in "next 9 minutes".

## 2020-11-18 NOTE — Consult Note (Signed)
ANTICOAGULATION CONSULT NOTE - Initial Consult  Pharmacy Consult for Heparin Infusion Indication: chest pain/ACS  Allergies  Allergen Reactions   Gabapentin Itching   Ivp Dye [Iodinated Diagnostic Agents] Itching   Metrizamide Itching    Patient Measurements: Weight: 75.8 kg (167 lb 1.7 oz) Heparin Dosing Weight: 75.8 kg  Vital Signs: Temp: 98.2 F (36.8 C) (06/20 0827) Temp Source: Rectal (06/20 0827) BP: 96/58 (06/20 1600) Pulse Rate: 96 (06/20 1012)  Labs: Recent Labs    11/27/2020 0821 11/11/2020 1153 11/13/2020 1502  HGB 11.4*  --   --   HCT 35.3*  --   --   PLT 300  --   --   APTT  --  37*  --   LABPROT  --  19.9*  --   INR  --  1.7*  --   CREATININE 3.29*  --   --   TROPONINIHS 42* 332* 1,565*    Estimated Creatinine Clearance: 13.1 mL/min (A) (by C-G formula based on SCr of 3.29 mg/dL (H)).   Medical History: Past Medical History:  Diagnosis Date   Arthritis    lower back, knees   Breast cancer ([place]) 2011   RT LUMPECTOMY   Chemotherapy follow-up examination 2011   RT BREAST CANCER   Cirrhosis ([place])    Personal history of chemotherapy    Personal history of radiation therapy    Radiation 2011   RT BREAST CANCER   Skin cancer    Wears dentures    full upper, partial lower    Medications:  (Not in a hospital admission)  Scheduled:   heparin  2,000 Units Intravenous Once   nicotine  21 mg Transdermal Daily   Infusions:   cefTRIAXone (ROCEPHIN)  IV Stopped (11/14/2020 1012)   heparin     lactated ringers 100 mL/hr at 11/21/2020 1505   PRN: acetaminophen, ondansetron (ZOFRAN) IV, oxyCODONE-acetaminophen  Assessment: Pharmacy has been consulted to initiate Heparin infusion in 85yo patient with history Atrial fibrillation(on Eliquis PTA) who presents to the ED with chest pain from skilled nursing facility. Patient's last dose of eliquis was taken yesterday evening, 11/17/20. Baseline labs: aPTT 37sec, INR 1.7, HL pending, Plts 300, Hgb 11.4  Goal of  Therapy:  Heparin level 0.3-0.7 units/ml aPTT 66-102 seconds Monitor platelets by anticoagulation protocol: Yes   Plan:  Give 2000 units bolus x 1(half of normal bolus) Start heparin infusion at 900 units/hr Check aPTT level in 8 hours and HL daily until both levels correlate, then will transition to HL dosing only. Continue to monitor H&H and platelets  Zyairah Wacha A Alijah Akram 11/16/2020,5:26 PM

## 2020-11-18 NOTE — ED Notes (Signed)
New IV wrapped with occlusive dressing in attempt to protect since arms continue to weep fluid.

## 2020-11-18 NOTE — ED Triage Notes (Signed)
Arrives from [facility] for c/o CP.  Pain started last night, miloxicam given and patient slept overnight.  History of AFIB.  325 mg ASA given PTA at facility.  Per EMS patient c/o abdominal pain.  History of extremity swelling and afib.

## 2020-11-18 NOTE — ED Notes (Signed)
[name] Agricultural consultant at bedside attempting manual BP.

## 2020-11-18 NOTE — ED Notes (Signed)
IV team at bedside pulled repeat lactic, trop, and blue tube.

## 2020-11-18 NOTE — ED Notes (Signed)
Current manuel BP 60s/30s.

## 2020-11-18 NOTE — ED Notes (Signed)
Provider Mora Bellman notified via secure chat of inc lactic from 3.5 to 6.5. Visitor at bedside.

## 2020-11-18 NOTE — ED Notes (Signed)
Return from CT.  Rocephin infusing.  Patient resting.

## 2020-11-18 NOTE — ED Notes (Signed)
Family members remain at bedside with pt. All 3 allowed back to bedside.

## 2020-11-18 NOTE — ED Notes (Addendum)
Provider Mansy talking with family at bedside to decide on course whether comfort care or not. Pt's husband on his way to hospital. Family currently wants pt to remain on levophed until father gets here.

## 2020-11-18 NOTE — ED Notes (Signed)
Pt switched over from stretcher to hillrom centrella bed by this RN, Caryl Pina RN, and Presenter, broadcasting. Visitor remains at bedside. LR running at 100cc/hr on pump. Sheets and bed pads changed again as pt's arms and legs continue to weep.

## 2020-11-18 NOTE — ED Provider Notes (Signed)
[facility] Emergency Department Provider Note ____________________________________________   Event Date/Time   First MD Initiated Contact with Patient 11/07/2020 [phone]     (approximate)  I have reviewed the triage vital signs and the nursing notes.  HISTORY  Chief Complaint Chest Pain   HPI [name] is a 85 y.o. femalewho presents to the ED for evaluation of chest pain  Chart review indicates history of breast cancer s/p right-sided lumpectomy and chemotherapy/radiation, nicotine dependence, chronic atrial fibrillation, venous stasis.  Admission 1 month ago for left leg cellulitis.  On Eliquis due to her A. fib.  Patient presents to the ED via EMS for chest pain from her SNF.  She is unable to provide any relevant history due to her dementia and encephalopathy.  Majority of history is provided by 2 of her 3 children over the phone.  I discussed the case with [name] and [name] (mPOA).  They report that she just does not eat and they have not seen her for a few days.  They heard from the facility that she was sent to the ED, but uncertain why.  We discussed CODE STATUS and they report she is DNR, okay with fluids, medications for heart, antibiotics, but no procedural interventions.  Past Medical History:  Diagnosis Date   Arthritis    lower back, knees   Breast cancer ([place]) 2011   RT LUMPECTOMY   Chemotherapy follow-up examination 2011   RT BREAST CANCER   Cirrhosis ([place])    Personal history of chemotherapy    Personal history of radiation therapy    Radiation 2011   RT BREAST CANCER   Skin cancer    Wears dentures    full upper, partial lower    Patient Active Problem List   Diagnosis Date Noted   UTI (urinary tract infection) 11/25/2020   Chest pain 11/26/2020   Elevated troponin 11/26/2020   Acute renal failure superimposed on stage 4 chronic kidney disease ([place]) 11/24/2020   Tobacco abuse 11/18/2020   Left leg cellulitis 10/18/2020    AKI (acute kidney injury) ([place]) 10/18/2020   Venous ulcer of ankle ([place]) 10/11/2020   Lymphedema 10/11/2020   ASCVD (arteriosclerotic cardiovascular disease) 10/10/2020   Neuropathy 10/10/2020   Osteoarthritis 10/10/2020   Chronic a-fib ([place]) 08/12/2020   Dizziness 08/12/2020   NASH (nonalcoholic steatohepatitis) 07/13/2020   Personal history of breast cancer 07/13/2020   Personal history of pulmonary embolism 07/13/2020   Gastro-esophageal reflux disease without esophagitis 07/12/2020   Other cirrhosis of liver ([place]) 07/10/2020   Malignant neoplasm of liver, not specified as primary or secondary ([place]) 07/10/2020   Debility 06/29/2020   Severe sepsis ([place]) 06/12/2020   Bilateral leg edema 06/07/2020   Hyperlipidemia, mixed 06/07/2020   SOBOE (shortness of breath on exertion) 06/07/2020   Acute pulmonary embolism ([place]) 06/02/2020   History of lumbar surgery 07/22/2018   Spinal stenosis of lumbar region at multiple levels 07/22/2018   LBP (low back pain) 02/14/2015   Cystic disease of kidney 11/21/2014   Splenomegaly 11/21/2014   Pain and swelling of lower leg 07/30/2014   Osteopenia 06/01/2014   Leg cramps 05/30/2014   Breast cancer ([place]) 01/19/2013   Breast CA ([place]) 01/19/2013    Past Surgical History:  Procedure Laterality Date   BREAST BIOPSY Right 2014   RT CORE W/CLIP - NEG   BREAST EXCISIONAL BIOPSY Right 2011   breast ca   CATARACT EXTRACTION W/PHACO Right 04/15/2016   Procedure: CATARACT EXTRACTION PHACO  AND INTRAOCULAR LENS PLACEMENT (IOC);  Surgeon: Leandrew Koyanagi, MD;  Location: [facility];  Service: Ophthalmology;  Laterality: Right;   CATARACT EXTRACTION W/PHACO Left 07/01/2016   Procedure: CATARACT EXTRACTION PHACO AND INTRAOCULAR LENS PLACEMENT (IOC);  Surgeon: Leandrew Koyanagi, MD;  Location: [facility];  Service: Ophthalmology;  Laterality: Left;  Left eye   CHOLECYSTECTOMY      Prior to Admission medications   Medication Sig  Start Date End Date Taking? Authorizing Provider  apixaban (ELIQUIS) 2.5 MG TABS tablet Take 1 tablet (2.5 mg total) by mouth 2 (two) times daily. 10/22/20  Yes Sharen Hones, MD  metoprolol tartrate (LOPRESSOR) 25 MG tablet Take 0.5 tablets (12.5 mg total) by mouth 2 (two) times daily. 06/04/20  Yes Shelly Coss, MD  pantoprazole (PROTONIX) 40 MG tablet Take 1 tablet (40 mg total) by mouth daily. 06/04/20  Yes Shelly Coss, MD  Pediatric Multivitamins-Iron (POLY-VITAMIN/IRON PO) Take by mouth daily.   Yes [provider]  QUEtiapine (SEROQUEL) 25 MG tablet Take 1 tablet (25 mg total) by mouth at bedtime. 10/22/20  Yes Sharen Hones, MD  vitamin C (ASCORBIC ACID) 500 MG tablet Take 500 mg by mouth daily.   Yes [provider]  zinc sulfate 220 (50 Zn) MG capsule Take 220 mg by mouth daily.   Yes [provider]  acetaminophen (TYLENOL) 500 MG tablet Take 1,000 mg by mouth as needed.    [provider]    Allergies Gabapentin, Ivp dye [iodinated diagnostic agents], and Metrizamide  Family History  Problem Relation Age of Onset   Cancer Father        prostate   Cancer Brother        prostate   Breast cancer Neg Hx     Social History Social History   Tobacco Use   Smoking status: Every Day    Packs/day: 0.50    Pack years: 0.00    Types: Cigarettes    Last attempt to quit: 04/28/2014    Years since quitting: 6.5   Smokeless tobacco: Former  Substance Use Topics   Alcohol use: No    Alcohol/week: 0.0 standard drinks    Comment: occasional glass of wine   Drug use: No    Review of Systems  Unable to be assessed due to patient's altered mentation and dementia ____________________________________________   PHYSICAL EXAM:  VITAL SIGNS: Vitals:   11/05/2020 0930 11/06/2020 1012  BP: 91/65 99/62  Pulse: (!) 114 96  Resp: 19 13  Temp:    SpO2: 98% 99%      Constitutional: Alert and sitting up in bed, oriented only to self.  Follows  commands in all 4 extremities. Eyes: Conjunctivae are normal. PERRL. EOMI. Head: Atraumatic. Nose: No congestion/rhinnorhea. Mouth/Throat: Mucous membranes are dry.  Oropharynx non-erythematous. Neck: No stridor. No cervical spine tenderness to palpation. Cardiovascular: Tachycardic rate, irregular rhythm. Grossly normal heart sounds.  Good peripheral circulation. Respiratory: Normal respiratory effort.  No retractions. Lungs CTAB. Gastrointestinal: Soft , nondistended.  Musculoskeletal: No lower extremity tenderness nor edema.  No joint effusions. No signs of acute trauma. Neurologic:   No gross focal neurologic deficits are appreciated.  Skin:  Skin is warm, dry Psychiatric: Mood and affect are difficult to assess due to her disorientation  ____________________________________________   LABS (all labs ordered are listed, but only abnormal results are displayed)  Labs Reviewed  BASIC METABOLIC PANEL - Abnormal; Notable for the following components:      Result Value  Chloride 112 (*)    CO2 13 (*)    BUN 88 (*)    Creatinine, Ser 3.29 (*)    Calcium 8.0 (*)    GFR, Estimated 13 (*)    All other components within normal limits  CBC WITH DIFFERENTIAL/PLATELET - Abnormal; Notable for the following components:   WBC 12.5 (*)    RBC 3.52 (*)    Hemoglobin 11.4 (*)    HCT 35.3 (*)    MCV 100.3 (*)    RDW 18.5 (*)    Neutro Abs 9.0 (*)    Abs Immature Granulocytes 0.33 (*)    All other components within normal limits  URINALYSIS, COMPLETE (UACMP) WITH MICROSCOPIC - Abnormal; Notable for the following components:   Color, Urine AMBER (*)    APPearance CLOUDY (*)    Hgb urine dipstick LARGE (*)    Protein, ur 100 (*)    Leukocytes,Ua MODERATE (*)    RBC / HPF >50 (*)    WBC, UA >50 (*)    Bacteria, UA FEW (*)    Non Squamous Epithelial PRESENT (*)    All other components within normal limits  LACTIC ACID, PLASMA - Abnormal; Notable for the following components:   Lactic  Acid, Venous 3.5 (*)    All other components within normal limits  TROPONIN I (HIGH SENSITIVITY) - Abnormal; Notable for the following components:   Troponin I (High Sensitivity) 42 (*)    All other components within normal limits  CULTURE, BLOOD (SINGLE)  RESP PANEL BY RT-PCR (FLU A&B, COVID) ARPGX2  URINE CULTURE  CULTURE, BLOOD (ROUTINE X 2)  CULTURE, BLOOD (ROUTINE X 2)  MAGNESIUM  PROTIME-INR  AMMONIA  HEMOGLOBIN A1C  LACTIC ACID, PLASMA  LACTIC ACID, PLASMA  APTT  PROCALCITONIN  TROPONIN I (HIGH SENSITIVITY)   ____________________________________________  12 Lead EKG  Atrial fibrillation, rate of 121 bpm.  Normal axis and intervals.  No STEMI. ____________________________________________  RADIOLOGY  ED MD interpretation: 1 view CXR reviewed by me with right hilar atelectasis  Official radiology report(s): DG Chest Portable 1 View  Result Date: 11/24/2020 CLINICAL DATA:  85 year old female with chest pain, atrial fibrillation, RVR. EXAM: PORTABLE CHEST 1 VIEW COMPARISON:  Chest radiographs 06/29/2020 and earlier. FINDINGS: Portable AP semi upright view at 0822 hours. Mildly rotated to the left. Stable lung volumes and mediastinal contours, with borderline to mild cardiomegaly. Visualized tracheal air column is within normal limits. No pneumothorax, pulmonary edema or definite effusion. There is an oval opacity at the right hilum which might be artifact or segmental atelectasis. Negative visible bowel gas pattern. Chronic right axillary and chest wall surgical clips. No acute osseous abnormality identified. IMPRESSION: 1. Oval opacity at the right hilum might be artifact or segmental atelectasis. PA and lateral views of the chest would be helpful when possible. 2. No other acute cardiopulmonary abnormality. Electronically Signed   By: Genevie Ann M.D.   On: 11/17/2020 08:49   CT Renal Stone Study  Result Date: 10/31/2020 CLINICAL DATA:  85 year old female with renal  insufficiency, UTI. Pain. Atrial fibrillation EXAM: CT ABDOMEN AND PELVIS WITHOUT CONTRAST TECHNIQUE: Multidetector CT imaging of the abdomen and pelvis was performed following the standard protocol without IV contrast. COMPARISON:  CT Abdomen and Pelvis 06/12/2020 and earlier. FINDINGS: Lower chest: Stable mild cardiomegaly. No pericardial effusion. Moderate bilateral layering pleural effusions are increased since January with associated compressive lung base atelectasis. Focal atelectasis also suspected in the right lateral costophrenic angle. Chronic postoperative changes to  the right lower breast tissue appear stable since January. Hepatobiliary: Nodular, cirrhotic liver. No discrete liver lesion in the absence of contrast. Chronically absent gallbladder. Perihepatic ascites is small to moderate and stable since January. Pancreas: Atrophied. Spleen: Decreased spleen size since January. Small volume perisplenic ascites has increased. Adrenals/Urinary Tract: Normal adrenal glands. Nonobstructed kidneys. Occasional renal cysts. No nephrolithiasis. Decompressed ureters. Diminutive urinary bladder. Stomach/Bowel: Increased retained stool in the rectum but no dilated large or small bowel. Diverticulosis in the distal descending and sigmoid colon7. Occasional diverticula7 in the transverse colon.a decompressed and negative terminal ileum. Unremarkable stomach and duodenum. No free air. Vascular/Lymphatic: Aortoiliac calcified atherosclerosis. Normal caliber abdominal aorta. Vascular patency is not evaluated in the absence of IV contrast. Reproductive: Negative noncontrast appearance. Other: Moderate volume pelvic ascites has mildly increased. Musculoskeletal: Diffuse osteopenia with multilevel chronic lumbar fusion. No acute osseous abnormality identified. Increased bilateral body wall edema since January. There is a broad-based area of abnormal right ventral abdominal wall subcutaneous gas (coronal image 20) extending  to the superficial muscle layer in an area of 10 cm or so. No regional soft tissue fluid, and also on lung windows there appears to be an implanted subcutaneous device here. See series 2, image 52. IMPRESSION: 1. Anasarca and moderate layering pleural effusions are increased since January. 2. Chronic Cirrhosis with small to moderate volume pelvic ascites, increased since January. Small volume abdominal ascites has not significantly changed. Decreased spleen size since January. 3. No obstructive uropathy. 4. A 10 cm area of right side ventral abdominal wall soft tissue gas appears related to an implanted device, perhaps a glucose monitor. Please correlate clinically. 5. Aortic Atherosclerosis (ICD10-I70.0). 6. Stable postoperative appearance of the visible lower right breast. Electronically Signed   By: Genevie Ann M.D.   On: 11/28/2020 10:33    ____________________________________________   PROCEDURES and INTERVENTIONS  Procedure(s) performed (including Critical Care):  .1-3 Lead EKG Interpretation  Date/Time: 11/10/2020 9:49 AM Performed by: Vladimir Crofts, MD Authorized by: Vladimir Crofts, MD     Interpretation: abnormal     ECG rate:  104   ECG rate assessment: tachycardic     Rhythm: atrial fibrillation     Ectopy: none     Conduction: normal   .Critical Care  Date/Time: 11/04/2020 9:49 AM Performed by: Vladimir Crofts, MD Authorized by: Vladimir Crofts, MD   Critical care provider statement:    Critical care time (minutes):  45   Critical care was necessary to treat or prevent imminent or life-threatening deterioration of the following conditions:  Sepsis   Critical care was time spent personally by me on the following activities:  Discussions with consultants, evaluation of patient's response to treatment, examination of patient, ordering and performing treatments and interventions, ordering and review of laboratory studies, ordering and review of radiographic studies, pulse oximetry,  re-evaluation of patient's condition, obtaining history from patient or surrogate and review of old charts  Medications  lactated ringers infusion (has no administration in time range)  cefTRIAXone (ROCEPHIN) 1 g in sodium chloride 0.9 % 100 mL IVPB (0 g Intravenous Stopped 11/16/2020 1012)  lactated ringers bolus 1,000 mL (has no administration in time range)  ondansetron (ZOFRAN) injection 4 mg (has no administration in time range)  acetaminophen (TYLENOL) tablet 650 mg (has no administration in time range)  nicotine (NICODERM CQ - dosed in mg/24 hours) patch 21 mg (has no administration in time range)  oxyCODONE-acetaminophen (PERCOCET/ROXICET) 5-325 MG per tablet 1 tablet (has no administration in time range)  morphine 2 MG/ML injection 2 mg (2 mg Intravenous Given 11/02/2020 0842)  lactated ringers bolus 1,000 mL (1,000 mLs Intravenous New Bag/Given 11/07/2020 0847)    ____________________________________________   MDM / ED COURSE   85 year old woman with DNR in place presents to the ED from her SNF via EMS for evaluation of chest discomfort, found to have evidence of sepsis possibly due to UTI versus SBP, requiring medical admission.  Tachycardic on arrival, and remains hemodynamically stable without pressors.  Exam is difficult due to her disorientation and dementia.  She shows stigmata of dehydration.  No focal deficits neurologically or vascularly.  Blood work with evidence of sepsis with leukocytosis and lactic acidosis, as well as AKI and metabolic acidosis.  Urine is concerning for infectious etiology of this.  CT renal study demonstrates no obstructive uropathy contributing to her AKI and UTI.  She was provided Rocephin and cultures were drawn.  We will admit to medicine for further work-up and management.  Clinical Course as of 11/22/2020 1039  Mon Nov 18, 2020  0817 Discussing with [name], son. Who conferences in his sister, [name], daughter of patient.  [DS]  6283 Is,They prefer medical  management, without any interventions or procedures.  They are agreeable to fluids, medications for her heart, antibiotics and admission to the hospital.  No ventilators or more serious procedures. [DS]  1517 I discuss the case with Dr. Blaine Hamper, who agrees to admit [DS]    Clinical Course User Index [DS] Vladimir Crofts, MD    ____________________________________________   FINAL CLINICAL IMPRESSION(S) / ED DIAGNOSES  Final diagnoses:  Sepsis with acute renal failure and tubular necrosis without septic shock, due to unspecified organism [facility])     ED Discharge Orders     None        Fatin Bachicha   Note:  This document was prepared using Dragon voice recognition software and may include unintentional dictation errors.    Vladimir Crofts, MD 11/07/2020 1040

## 2020-11-18 NOTE — ED Notes (Signed)
Pt's daughter updated by this RN while pt's significant other at bedside with her on his personal phone.

## 2020-11-18 NOTE — ED Notes (Signed)
Phlebotomy at bedside attempting to draw blood cultures and PT- INR

## 2020-11-18 NOTE — ED Notes (Signed)
Upon assessing L upper arm IV, noted that IV leaking and pulled partially loose by pt. Attempted to adjust placement and check with flush and to pull back blood. Pt's arm red and tender just above where IV used to be. IV removed due to infiltration. Bolus started back up at L hand IV which pulls back blood well.

## 2020-11-18 NOTE — ED Notes (Signed)
Per provider Mora Bellman, next lactic to be collected whenever current LR bolus completed.

## 2020-11-18 NOTE — ED Notes (Signed)
Repeat collect of blue tube for INR/PTT sent to lab as requested.

## 2020-11-18 NOTE — ED Notes (Signed)
Called lab about missing repeat trop results.

## 2020-11-18 NOTE — Consult Note (Signed)
[facility] Consultation Note  Patient ID: [name], MRN: [id number], DOB/AGE: [birth year] 85 y.o. Admit date: 11/15/2020   Date of Consult: 11/14/2020 Primary Physician: Sallee Lange, NP Primary Cardiologist: None  Chief Complaint:  Chief Complaint  Patient presents with   Chest Pain   Reason for Consult:  Chest pain with elevated troponin and atrial fibrillation  HPI: 85 y.o. female with known known cirrhosis with a liver with worsening and significant multiorgan failure with current glomerular filtration rate and chronic kidney disease of 13 paroxysmal nonvalvular atrial fibrillation with rapid ventricular rate and known coronary artery disease now with elevated troponin of 1565 consistent with non-ST elevation myocardial infarction after having episodes of chest pain yesterday.  It is not particularly responsive at this time and the family has considered palliative care otherwise her blood pressure is 70 systolic and heart rate is approximately 100 bpm with atrial fibrillation.  EKG has shown atrial fibrillation with right bundle branch block and rapid rate  Past Medical History:  Diagnosis Date   Arthritis    lower back, knees   Breast cancer ([place]) 2011   RT LUMPECTOMY   Chemotherapy follow-up examination 2011   RT BREAST CANCER   Cirrhosis ([place])    Personal history of chemotherapy    Personal history of radiation therapy    Radiation 2011   RT BREAST CANCER   Skin cancer    Wears dentures    full upper, partial lower      Surgical History:  Past Surgical History:  Procedure Laterality Date   BREAST BIOPSY Right 2014   RT CORE W/CLIP - NEG   BREAST EXCISIONAL BIOPSY Right 2011   breast ca   CATARACT EXTRACTION W/PHACO Right 04/15/2016   Procedure: CATARACT EXTRACTION PHACO AND INTRAOCULAR LENS PLACEMENT (IOC);  Surgeon: Leandrew Koyanagi, MD;  Location: [place];  Service: Ophthalmology;  Laterality: Right;   CATARACT EXTRACTION  W/PHACO Left 07/01/2016   Procedure: CATARACT EXTRACTION PHACO AND INTRAOCULAR LENS PLACEMENT (IOC);  Surgeon: Leandrew Koyanagi, MD;  Location: [place];  Service: Ophthalmology;  Laterality: Left;  Left eye   CHOLECYSTECTOMY       Home Meds: Prior to Admission medications   Medication Sig Start Date End Date Taking? Authorizing Provider  apixaban (ELIQUIS) 2.5 MG TABS tablet Take 1 tablet (2.5 mg total) by mouth 2 (two) times daily. 10/22/20  Yes Sharen Hones, MD  metoprolol tartrate (LOPRESSOR) 25 MG tablet Take 0.5 tablets (12.5 mg total) by mouth 2 (two) times daily. 06/04/20  Yes Shelly Coss, MD  pantoprazole (PROTONIX) 40 MG tablet Take 1 tablet (40 mg total) by mouth daily. 06/04/20  Yes Shelly Coss, MD  Pediatric Multivitamins-Iron (POLY-VITAMIN/IRON PO) Take by mouth daily.   Yes [provider]  QUEtiapine (SEROQUEL) 25 MG tablet Take 1 tablet (25 mg total) by mouth at bedtime. 10/22/20  Yes Sharen Hones, MD  vitamin C (ASCORBIC ACID) 500 MG tablet Take 500 mg by mouth daily.   Yes [provider]  zinc sulfate 220 (50 Zn) MG capsule Take 220 mg by mouth daily.   Yes [provider]  acetaminophen (TYLENOL) 500 MG tablet Take 1,000 mg by mouth as needed.    [provider]    Inpatient Medications:   atorvastatin  20 mg Oral QHS   nicotine  21 mg Transdermal Daily    cefTRIAXone (ROCEPHIN)  IV Stopped (11/26/2020 1012)   heparin 900 Units/hr (11/16/2020 1746)   lactated ringers  100 mL/hr at 10/30/2020 1505    Allergies:  Allergies  Allergen Reactions   Gabapentin Itching   Ivp Dye [Iodinated Diagnostic Agents] Itching   Metrizamide Itching    Social History   Socioeconomic History   Marital status: Divorced    Spouse name: Not on file   Number of children: Not on file   Years of education: Not on file   Highest education level: Not on file  Occupational History   Not on file  Tobacco Use   Smoking status: Every Day     Packs/day: 0.50    Pack years: 0.00    Types: Cigarettes    Last attempt to quit: 04/28/2014    Years since quitting: 6.5   Smokeless tobacco: Former  Substance and Sexual Activity   Alcohol use: No    Alcohol/week: 0.0 standard drinks    Comment: occasional glass of wine   Drug use: No   Sexual activity: Not on file  Other Topics Concern   Not on file  Social History Narrative   Not on file   Social Determinants of Health   Financial Resource Strain: Not on file  Food Insecurity: Not on file  Transportation Needs: Not on file  Physical Activity: Not on file  Stress: Not on file  Social Connections: Not on file  Intimate Partner Violence: Not on file     Family History  Problem Relation Age of Onset   Cancer Father        prostate   Cancer Brother        prostate   Breast cancer Neg Hx      Review of Systems Cannot assess due to patient's obtundation  Labs: No results for input(s): CKTOTAL, CKMB, TROPONINI in the last 72 hours. Lab Results  Component Value Date   WBC 12.5 (H) 11/17/2020   HGB 11.4 (L) 11/28/2020   HCT 35.3 (L) 11/17/2020   MCV 100.3 (H) 11/08/2020   PLT 300 11/07/2020    Recent Labs  Lab 11/08/2020 0821  NA 137  K 4.7  CL 112*  CO2 13*  BUN 88*  CREATININE 3.29*  CALCIUM 8.0*  GLUCOSE 94   No results found for: CHOL, HDL, LDLCALC, TRIG No results found for: DDIMER  Radiology/Studies:  CT HEAD WO CONTRAST  Result Date: 11/07/2020 CLINICAL DATA:  Delirium. EXAM: CT HEAD WITHOUT CONTRAST TECHNIQUE: Contiguous axial images were obtained from the base of the skull through the vertex without intravenous contrast. COMPARISON:  Most recent head CT 06/29/2020 FINDINGS: Brain: Stable degree of atrophy and chronic small vessel ischemia. No intracranial hemorrhage, mass effect, or midline shift. No hydrocephalus. The basilar cisterns are patent. No evidence of territorial infarct or acute ischemia. No extra-axial or intracranial fluid  collection. Vascular: Atherosclerosis of skullbase vasculature without hyperdense vessel or abnormal calcification. Skull: No fracture or focal lesion. Sinuses/Orbits: Paranasal sinuses and mastoid air cells are clear. The visualized orbits are unremarkable. Bilateral cataract resection. Other: None. IMPRESSION: 1. No acute intracranial abnormality. 2. Stable atrophy and chronic small vessel ischemia. Electronically Signed   By: Keith Rake M.D.   On: 11/28/2020 14:58   DG Chest Portable 1 View  Result Date: 10/30/2020 CLINICAL DATA:  85 year old female with chest pain, atrial fibrillation, RVR. EXAM: PORTABLE CHEST 1 VIEW COMPARISON:  Chest radiographs 06/29/2020 and earlier. FINDINGS: Portable AP semi upright view at 0822 hours. Mildly rotated to the left. Stable lung volumes and mediastinal contours, with borderline to mild cardiomegaly. Visualized tracheal air  column is within normal limits. No pneumothorax, pulmonary edema or definite effusion. There is an oval opacity at the right hilum which might be artifact or segmental atelectasis. Negative visible bowel gas pattern. Chronic right axillary and chest wall surgical clips. No acute osseous abnormality identified. IMPRESSION: 1. Oval opacity at the right hilum might be artifact or segmental atelectasis. PA and lateral views of the chest would be helpful when possible. 2. No other acute cardiopulmonary abnormality. Electronically Signed   By: Genevie Ann M.D.   On: 11/13/2020 08:49   CT Renal Stone Study  Result Date: 11/15/2020 CLINICAL DATA:  85 year old female with renal insufficiency, UTI. Pain. Atrial fibrillation EXAM: CT ABDOMEN AND PELVIS WITHOUT CONTRAST TECHNIQUE: Multidetector CT imaging of the abdomen and pelvis was performed following the standard protocol without IV contrast. COMPARISON:  CT Abdomen and Pelvis 06/12/2020 and earlier. FINDINGS: Lower chest: Stable mild cardiomegaly. No pericardial effusion. Moderate bilateral layering  pleural effusions are increased since January with associated compressive lung base atelectasis. Focal atelectasis also suspected in the right lateral costophrenic angle. Chronic postoperative changes to the right lower breast tissue appear stable since January. Hepatobiliary: Nodular, cirrhotic liver. No discrete liver lesion in the absence of contrast. Chronically absent gallbladder. Perihepatic ascites is small to moderate and stable since January. Pancreas: Atrophied. Spleen: Decreased spleen size since January. Small volume perisplenic ascites has increased. Adrenals/Urinary Tract: Normal adrenal glands. Nonobstructed kidneys. Occasional renal cysts. No nephrolithiasis. Decompressed ureters. Diminutive urinary bladder. Stomach/Bowel: Increased retained stool in the rectum but no dilated large or small bowel. Diverticulosis in the distal descending and sigmoid colon7. Occasional diverticula7 in the transverse colon.a decompressed and negative terminal ileum. Unremarkable stomach and duodenum. No free air. Vascular/Lymphatic: Aortoiliac calcified atherosclerosis. Normal caliber abdominal aorta. Vascular patency is not evaluated in the absence of IV contrast. Reproductive: Negative noncontrast appearance. Other: Moderate volume pelvic ascites has mildly increased. Musculoskeletal: Diffuse osteopenia with multilevel chronic lumbar fusion. No acute osseous abnormality identified. Increased bilateral body wall edema since January. There is a broad-based area of abnormal right ventral abdominal wall subcutaneous gas (coronal image 20) extending to the superficial muscle layer in an area of 10 cm or so. No regional soft tissue fluid, and also on lung windows there appears to be an implanted subcutaneous device here. See series 2, image 52. IMPRESSION: 1. Anasarca and moderate layering pleural effusions are increased since January. 2. Chronic Cirrhosis with small to moderate volume pelvic ascites, increased since  January. Small volume abdominal ascites has not significantly changed. Decreased spleen size since January. 3. No obstructive uropathy. 4. A 10 cm area of right side ventral abdominal wall soft tissue gas appears related to an implanted device, perhaps a glucose monitor. Please correlate clinically. 5. Aortic Atherosclerosis (ICD10-I70.0). 6. Stable postoperative appearance of the visible lower right breast. Electronically Signed   By: Genevie Ann M.D.   On: 11/26/2020 10:33    EKG: Atrial fibrillation with rapid ventricular rate and right bundle branch block  Weights: Filed Weights   11/15/2020 0800  Weight: 75.8 kg     Physical Exam: Blood pressure (!) 95/52, pulse 96, temperature 98.2 F (36.8 C), temperature source Rectal, resp. rate 16, weight 75.8 kg, SpO2 95 %. Body mass index is 25.41 kg/m. General: Well developed, well nourished, not particularly responsive Head eyes ears nose throat: Normocephalic, atraumatic, sclera non-icteric, no xanthomas, nares are without discharge. No apparent thyromegaly and/or mass  Lungs: Normal respiratory effort.  no wheezes, no rales, no rhonchi.  Heart:  Irregular with normal S1 S2. no murmur gallop, no rub, PMI is normal size and placement, carotid upstroke normal without bruit, jugular venous pressure is normal Abdomen: Soft, non-tender, non-distended with normoactive bowel sounds. No hepatomegaly. No rebound/guarding. No obvious abdominal masses. Abdominal aorta is normal size without bruit Extremities: No edema. no cyanosis, no clubbing, no ulcers  Peripheral : 2+ bilateral upper extremity pulses, 2+ bilateral femoral pulses, 2+ bilateral dorsal pedal pulse Neuro: Is not alert and oriented. No facial asymmetry. No focal deficit. Moves all extremities spontaneously. Musculoskeletal: Normal muscle tone without kyphosis Psych: Does not responds to questions appropriately with a normal affect.    Assessment: 85 year old female with cirrhosis of the liver  and significant multiorgan failure having acute chest discomfort consistent with non-ST elevation myocardial infarction and atrial fibrillation with rapid ventricular rate but no apparent current congestive heart failure  Plan: 1.  No additional medication management for atrial fibrillation with heart rate in reasonable control without additional medications and concerns for hypotension 2.  Consider echocardiogram for LV systolic Defoe dysfunction valvular heart disease contributing to chest pain and non-ST elevation myocardial infarction 3.  Reasonable for heparin for 48 hours for non-ST elevation myocardial infarction and further treatment options after that including the possibility of Plavix and/or aspirin 4.  No invasive procedures necessary at this time 5.  Further consideration of other intervention after  Signed, Corey Skains M.D. [facility] 11/27/2020, 7:39 PM

## 2020-11-18 NOTE — ED Notes (Signed)
Automatic keeps reading pt's BP really low; attempted at L upper arm and R lower arm; attempted at R ankle. Attempted manual BP but not confident in reading as is extremely hard to hear. Charge RN Lorriane Shire notified and requested that she attempt to take manual to double check or see if she can hear more confidently.

## 2020-11-18 NOTE — ED Notes (Signed)
LR held while rocephin infusing

## 2020-11-18 NOTE — H&P (Signed)
History and Physical    [name] IHK:[id number] DOB: 1936-03-18 DOA: 11/02/2020  Referring MD/NP/PA:   PCP: Sallee Lange, NP   Patient coming from:  The patient is coming from SNF.   Chief Complaint: AMS, chest pain, abdominal pain  HPI: [name] is a 85 y.o. female with medical history significant of HLD, skin CA, NASH and liver cirrhosis, PE and A fib on Eliquis, tobacco abuse, CKD-IV, who presents with AMS, chest pain and abdominal pain.  Patient has AMS and is unable to provide accurate medical history. Her ex-husband at the bedside provided some medical history.  I also called her daughter and son by phone to have collected some medical history.  Medical history is still limited.  Per family, patient complains of chest pain in facility, but no cough, shortness of breath, fever or chills.  Patient had some abdominal pain yesterday, and has intermittent diarrhea recently.  No active nausea, vomiting.  She has poor appetite and decreased oral intake.  She drinks relates of fluid per her son.  Not sure if patient has symptoms of UTI.  Patient moves all extremities.  No facial droop or slurred speech.  At her normal baseline, patient is alert and oriented x3.  Patient is oriented today, she knows her own name, not oriented to time and place.   ED Course: pt was found to have WBC 12.1, negative COVID PCR, ammonia level 13, lactic acid 3.5, 6.5, 9.4, troponin level 42, 332, 1565, positive urinalysis (cloudy appearance, moderate amount of leukocyte, few bacteria, WBC> 50), worsening renal function, temperature normal, soft blood pressure, heart rate of 114, RR 20, oxygen saturation 97% on room air.  CT head is negative for acute intracranial abnormalities.  Chest x-ray showed oval opacity in the right hilum.  CT per renal stone protocol is negative for obstructive stone.  Patient is admitted to progressive bed as inpatient  Review of Systems: Could not be reviewed due to  altered mental status  Allergy:  Allergies  Allergen Reactions   Gabapentin Itching   Ivp Dye [Iodinated Diagnostic Agents] Itching   Metrizamide Itching    Past Medical History:  Diagnosis Date   Arthritis    lower back, knees   Breast cancer ([place]) 2011   RT LUMPECTOMY   Chemotherapy follow-up examination 2011   RT BREAST CANCER   Cirrhosis ([place])    Personal history of chemotherapy    Personal history of radiation therapy    Radiation 2011   RT BREAST CANCER   Skin cancer    Wears dentures    full upper, partial lower    Past Surgical History:  Procedure Laterality Date   BREAST BIOPSY Right 2014   RT CORE W/CLIP - NEG   BREAST EXCISIONAL BIOPSY Right 2011   breast ca   CATARACT EXTRACTION W/PHACO Right 04/15/2016   Procedure: CATARACT EXTRACTION PHACO AND INTRAOCULAR LENS PLACEMENT ([place]);  Surgeon: Leandrew Koyanagi, MD;  Location: [facility];  Service: Ophthalmology;  Laterality: Right;   CATARACT EXTRACTION W/PHACO Left 07/01/2016   Procedure: CATARACT EXTRACTION PHACO AND INTRAOCULAR LENS PLACEMENT ([facility]);  Surgeon: Leandrew Koyanagi, MD;  Location: [facility];  Service: Ophthalmology;  Laterality: Left;  Left eye   CHOLECYSTECTOMY      Social History:  reports that she has been smoking cigarettes. She has been smoking an average of 0.50 packs per day. She has quit using smokeless tobacco. She reports that she does not drink alcohol and does  not use drugs.  Family History:  Family History  Problem Relation Age of Onset   Cancer Father        prostate   Cancer Brother        prostate   Breast cancer Neg Hx      Prior to Admission medications   Medication Sig Start Date End Date Taking? Authorizing Provider  acetaminophen (TYLENOL) 500 MG tablet Take 1,000 mg by mouth as needed.    [provider]  apixaban (ELIQUIS) 2.5 MG TABS tablet Take 1 tablet (2.5 mg total) by mouth 2 (two) times daily. 10/22/20   Sharen Hones, MD   metoprolol tartrate (LOPRESSOR) 25 MG tablet Take 0.5 tablets (12.5 mg total) by mouth 2 (two) times daily. Patient not taking: No sig reported 06/04/20   Shelly Coss, MD  pantoprazole (PROTONIX) 40 MG tablet Take 1 tablet (40 mg total) by mouth daily. Patient not taking: No sig reported 06/04/20   Shelly Coss, MD  QUEtiapine (SEROQUEL) 25 MG tablet Take 1 tablet (25 mg total) by mouth at bedtime. 10/22/20   Sharen Hones, MD    Physical Exam: Vitals:   10/30/2020 1745 11/02/2020 1800 11/26/2020 1830 11/11/2020 1850  BP:    (!) 95/52  Pulse:      Resp: 12  20 16   Temp:      TempSrc:      SpO2:  95%    Weight:       General: Not in acute distress.  Dry mucous membranes HEENT:       Eyes: PERRL, EOMI, no scleral icterus.       ENT: No discharge from the ears and nose       Neck: No JVD, no bruit, no mass felt. Heme: No neck lymph node enlargement. Cardiac: S1/S2, RRR, No murmurs, No gallops or rubs. Respiratory: No rales, wheezing, rhonchi or rubs. GI: Soft, nondistended, seems to have lower abdominal tenderness, BS present. GU: No hematuria Ext: has venous insufficiency change with trace leg edema bilaterally. 1+DP/PT pulse bilaterally. Musculoskeletal: No joint deformities, No joint redness or warmth, no limitation of ROM in spin. Skin: No rashes.  Neuro: Confused, knows her own name, not oriented to time and place, cranial nerves II-XII grossly intact, moves all extremitie Psych: Patient is not psychotic, no suicidal or hemocidal ideation.  Labs on Admission: I have personally reviewed following labs and imaging studies  CBC: Recent Labs  Lab 11/05/2020 0821  WBC 12.5*  NEUTROABS 9.0*  HGB 11.4*  HCT 35.3*  MCV 100.3*  PLT 921   Basic Metabolic Panel: Recent Labs  Lab 11/26/2020 0821  NA 137  K 4.7  CL 112*  CO2 13*  GLUCOSE 94  BUN 88*  CREATININE 3.29*  CALCIUM 8.0*  MG 1.8   GFR: Estimated Creatinine Clearance: 13.1 mL/min (A) (by C-G formula based on SCr of  3.29 mg/dL (H)). Liver Function Tests: No results for input(s): AST, ALT, ALKPHOS, BILITOT, PROT, ALBUMIN in the last 168 hours. No results for input(s): LIPASE, AMYLASE in the last 168 hours. Recent Labs  Lab 11/07/2020 1153  AMMONIA 13   Coagulation Profile: Recent Labs  Lab 11/17/2020 1153  INR 1.7*   Cardiac Enzymes: No results for input(s): CKTOTAL, CKMB, CKMBINDEX, TROPONINI in the last 168 hours. BNP (last 3 results) No results for input(s): PROBNP in the last 8760 hours. HbA1C: No results for input(s): HGBA1C in the last 72 hours. CBG: No results for input(s): GLUCAP in the last 168 hours.  Lipid Profile: No results for input(s): CHOL, HDL, LDLCALC, TRIG, CHOLHDL, LDLDIRECT in the last 72 hours. Thyroid Function Tests: No results for input(s): TSH, T4TOTAL, FREET4, T3FREE, THYROIDAB in the last 72 hours. Anemia Panel: No results for input(s): VITAMINB12, FOLATE, FERRITIN, TIBC, IRON, RETICCTPCT in the last 72 hours. Urine analysis:    Component Value Date/Time   COLORURINE AMBER (A) 11/25/2020 0900   APPEARANCEUR CLOUDY (A) 11/21/2020 0900   APPEARANCEUR Cloudy (A) 11/28/2014 1456   LABSPEC 1.013 11/24/2020 0900   PHURINE 5.0 11/10/2020 0900   GLUCOSEU NEGATIVE 11/23/2020 0900   HGBUR LARGE (A) 11/09/2020 0900   BILIRUBINUR NEGATIVE 11/16/2020 0900   BILIRUBINUR Negative 11/28/2014 1456   KETONESUR NEGATIVE 10/30/2020 0900   PROTEINUR 100 (A) 11/15/2020 0900   NITRITE NEGATIVE 11/13/2020 0900   LEUKOCYTESUR MODERATE (A) 11/24/2020 0900   Sepsis Labs: @LABRCNTIP (procalcitonin:4,lacticidven:4) ) Recent Results (from the past 240 hour(s))  Blood culture (single)     Status: None (Preliminary result)   Collection Time: 11/17/2020 10:35 AM   Specimen: BLOOD  Result Value Ref Range Status   Specimen Description BLOOD BLOOD RIGHT HAND  Final   Special Requests   Final    BOTTLES DRAWN AEROBIC ONLY Blood Culture results may not be optimal due to an inadequate volume  of blood received in culture bottles   Culture   Final    NO GROWTH < 12 HOURS Performed at [facility], [address] [place],  [address]    Report Status PENDING  Incomplete  Resp Panel by RT-PCR (Flu A&B, Covid) Nasopharyngeal Swab     Status: None   Collection Time: 11/11/2020 10:56 AM   Specimen: Nasopharyngeal Swab; Nasopharyngeal(NP) swabs in vial transport medium  Result Value Ref Range Status   SARS Coronavirus 2 by RT PCR NEGATIVE NEGATIVE Final    Comment: (NOTE) SARS-CoV-2 target nucleic acids are NOT DETECTED.  The SARS-CoV-2 RNA is generally detectable in upper respiratory specimens during the acute phase of infection. The lowest concentration of SARS-CoV-2 viral copies this assay can detect is 138 copies/mL. A negative result does not preclude SARS-Cov-2 infection and should not be used as the sole basis for treatment or other patient management decisions. A negative result may occur with  improper specimen collection/handling, submission of specimen other than nasopharyngeal swab, presence of viral mutation(s) within the areas targeted by this assay, and inadequate number of viral copies(<138 copies/mL). A negative result must be combined with clinical observations, patient history, and epidemiological information. The expected result is Negative.  Fact Sheet for Patients:  EntrepreneurPulse.com.au  Fact Sheet for Healthcare Providers:  IncredibleEmployment.be  This test is no t yet approved or cleared by the [place] FDA and  has been authorized for detection and/or diagnosis of SARS-CoV-2 by FDA under an Emergency Use Authorization (EUA). This EUA will remain  in effect (meaning this test can be used) for the duration of the COVID-19 declaration under Section 564(b)(1) of the Act, 21 U.S.C.section 360bbb-3(b)(1), unless the authorization is terminated  or revoked sooner.       Influenza A by PCR  NEGATIVE NEGATIVE Final   Influenza B by PCR NEGATIVE NEGATIVE Final    Comment: (NOTE) The Xpert Xpress SARS-CoV-2/FLU/RSV plus assay is intended as an aid in the diagnosis of influenza from Nasopharyngeal swab specimens and should not be used as a sole basis for treatment. Nasal washings and aspirates are unacceptable for Xpert Xpress SARS-CoV-2/FLU/RSV testing.  Fact Sheet for Patients: EntrepreneurPulse.com.au  Fact Sheet  for Healthcare Providers: IncredibleEmployment.be  This test is not yet approved or cleared by the [place] and has been authorized for detection and/or diagnosis of SARS-CoV-2 by FDA under an Emergency Use Authorization (EUA). This EUA will remain in effect (meaning this test can be used) for the duration of the COVID-19 declaration under Section 564(b)(1) of the Act, 21 U.S.C. section 360bbb-3(b)(1), unless the authorization is terminated or revoked.  Performed at [facility], [address] [place], [place] [address]      Radiological Exams on Admission: CT HEAD WO CONTRAST  Result Date: 11/28/2020 CLINICAL DATA:  Delirium. EXAM: CT HEAD WITHOUT CONTRAST TECHNIQUE: Contiguous axial images were obtained from the base of the skull through the vertex without intravenous contrast. COMPARISON:  Most recent head CT 06/29/2020 FINDINGS: Brain: Stable degree of atrophy and chronic small vessel ischemia. No intracranial hemorrhage, mass effect, or midline shift. No hydrocephalus. The basilar cisterns are patent. No evidence of territorial infarct or acute ischemia. No extra-axial or intracranial fluid collection. Vascular: Atherosclerosis of skullbase vasculature without hyperdense vessel or abnormal calcification. Skull: No fracture or focal lesion. Sinuses/Orbits: Paranasal sinuses and mastoid air cells are clear. The visualized orbits are unremarkable. Bilateral cataract resection. Other: None. IMPRESSION: 1. No  acute intracranial abnormality. 2. Stable atrophy and chronic small vessel ischemia. Electronically Signed   By: Keith Rake M.D.   On: 11/05/2020 14:58   DG Chest Portable 1 View  Result Date: 11/01/2020 CLINICAL DATA:  85 year old female with chest pain, atrial fibrillation, RVR. EXAM: PORTABLE CHEST 1 VIEW COMPARISON:  Chest radiographs 06/29/2020 and earlier. FINDINGS: Portable AP semi upright view at 0822 hours. Mildly rotated to the left. Stable lung volumes and mediastinal contours, with borderline to mild cardiomegaly. Visualized tracheal air column is within normal limits. No pneumothorax, pulmonary edema or definite effusion. There is an oval opacity at the right hilum which might be artifact or segmental atelectasis. Negative visible bowel gas pattern. Chronic right axillary and chest wall surgical clips. No acute osseous abnormality identified. IMPRESSION: 1. Oval opacity at the right hilum might be artifact or segmental atelectasis. PA and lateral views of the chest would be helpful when possible. 2. No other acute cardiopulmonary abnormality. Electronically Signed   By: Genevie Ann M.D.   On: 11/12/2020 08:49   CT Renal Stone Study  Result Date: 11/13/2020 CLINICAL DATA:  85 year old female with renal insufficiency, UTI. Pain. Atrial fibrillation EXAM: CT ABDOMEN AND PELVIS WITHOUT CONTRAST TECHNIQUE: Multidetector CT imaging of the abdomen and pelvis was performed following the standard protocol without IV contrast. COMPARISON:  CT Abdomen and Pelvis 06/12/2020 and earlier. FINDINGS: Lower chest: Stable mild cardiomegaly. No pericardial effusion. Moderate bilateral layering pleural effusions are increased since January with associated compressive lung base atelectasis. Focal atelectasis also suspected in the right lateral costophrenic angle. Chronic postoperative changes to the right lower breast tissue appear stable since January. Hepatobiliary: Nodular, cirrhotic liver. No discrete liver  lesion in the absence of contrast. Chronically absent gallbladder. Perihepatic ascites is small to moderate and stable since January. Pancreas: Atrophied. Spleen: Decreased spleen size since January. Small volume perisplenic ascites has increased. Adrenals/Urinary Tract: Normal adrenal glands. Nonobstructed kidneys. Occasional renal cysts. No nephrolithiasis. Decompressed ureters. Diminutive urinary bladder. Stomach/Bowel: Increased retained stool in the rectum but no dilated large or small bowel. Diverticulosis in the distal descending and sigmoid colon7. Occasional diverticula7 in the transverse colon.a decompressed and negative terminal ileum. Unremarkable stomach and duodenum. No free air. Vascular/Lymphatic: Aortoiliac calcified atherosclerosis.  Normal caliber abdominal aorta. Vascular patency is not evaluated in the absence of IV contrast. Reproductive: Negative noncontrast appearance. Other: Moderate volume pelvic ascites has mildly increased. Musculoskeletal: Diffuse osteopenia with multilevel chronic lumbar fusion. No acute osseous abnormality identified. Increased bilateral body wall edema since January. There is a broad-based area of abnormal right ventral abdominal wall subcutaneous gas (coronal image 20) extending to the superficial muscle layer in an area of 10 cm or so. No regional soft tissue fluid, and also on lung windows there appears to be an implanted subcutaneous device here. See series 2, image 52. IMPRESSION: 1. Anasarca and moderate layering pleural effusions are increased since January. 2. Chronic Cirrhosis with small to moderate volume pelvic ascites, increased since January. Small volume abdominal ascites has not significantly changed. Decreased spleen size since January. 3. No obstructive uropathy. 4. A 10 cm area of right side ventral abdominal wall soft tissue gas appears related to an implanted device, perhaps a glucose monitor. Please correlate clinically. 5. Aortic Atherosclerosis  (ICD10-I70.0). 6. Stable postoperative appearance of the visible lower right breast. Electronically Signed   By: Genevie Ann M.D.   On: 11/09/2020 10:33     EKG: I have personally reviewed.  Atrial fibrillation, QTC 459, low voltage, early R wave progression, heart rate 121  Assessment/Plan Principal Problem:   Severe sepsis with septic shock (HCC) Active Problems:   Other cirrhosis of liver (HCC)   Chronic a-fib (HCC)   Gastro-esophageal reflux disease without esophagitis   NASH (nonalcoholic steatohepatitis)   UTI (urinary tract infection)   Chest pain   Elevated troponin   Acute renal failure superimposed on stage 4 chronic kidney disease (HCC)   Tobacco abuse   Acute metabolic encephalopathy   Diarrhea   Severe sepsis with septic shock due to UTI: Patient is critical for severe sepsis with septic shock, with leukocytosis with WBC 12.5, tachycardia with heart rate 114, elevated lactic acid up to 9.4.  Currently blood pressure is soft.  -Admitted to progressive bed as inpatient - IV Rocephin - Follow-up of blood culture and urine culture -will get Procalcitonin and trend lactic acid levels per sepsis protocol. -IVF: 3L of LR bolus in ED, followed by 100 cc/h   Other cirrhosis of liver due to NASH Ammonia level 13 -Check INR, PTT  Chest pain and Elevated troponin: Troponin level 42, 332, 1565.  Likely due to demand ischemia, but okay with treating, cannot rule out non-STEMI -Switch Eliquis to IV heparin -Trend troponin -Started Lipitor 20 mg daily -Check A1c, FLP -Consulted with Dr. Nehemiah Massed of cardiology  Chronic a-fib ([place]) -Metoprolol -Switched Eliquis to IV heparin  Gastro-esophageal reflux disease without esophagitis -Protonix  Acute renal failure superimposed on stage 4 chronic kidney disease ([place]): Baseline creatinine 1.6-2.0.  Her creatinine is 3.29, BUN 88.  CT of renal stone protocol negative for obstruction.  Due to UTI -Follow-up renal function by BMP -IV  fluid as above  Tobacco abuse -Nicotine patch  Acute metabolic encephalopathy: CT head negative.  Possibly due to multifactorial etiology, particularly sepsis and UTI -Frequent neuro check  Diarrhea -Check C. difficile and GI pathogen panel     DVT ppx: on IV Heparin     Code Status: DNR per her son and daughter Family Communication:   Yes, patient's son and daughter by phone  disposition Plan:  Anticipate discharge back to previous environment Consults called: Dr. Nehemiah Massed of cardiology Admission status and Level of care: Progressive Cardiac:   as inpt  Status is: Inpatient  Remains inpatient appropriate because:Inpatient level of care appropriate due to severity of illness  Dispo: The patient is from: SNF              Anticipated d/c is to: SNF              Patient currently is not medically stable to d/c.   Difficult to place patient No          Date of Service 11/02/2020    [facility]   If 7PM-7AM, please contact night-coverage [email or web address] 11/14/2020, 7:05 PM

## 2020-11-18 NOTE — ED Notes (Signed)
Provider Mora Bellman notified via secure chat of critical inc trop and lactic.

## 2020-11-18 NOTE — ED Notes (Signed)
IV team at bedside 

## 2020-11-18 NOTE — ED Notes (Signed)
Pt's gown and bed pads changed.

## 2020-11-18 NOTE — ED Notes (Signed)
Heparin gtt and LR "variable" when it comes to compatibility so placed another IV team consult.

## 2020-11-18 NOTE — ED Notes (Signed)
Attempted to started 2nd IV, unsuccessful.  Unable to draw blood cultures.  IV team consult placed.

## 2020-11-18 NOTE — ED Notes (Signed)
Pt's lunch tray to bedside. Pt refused to eat per visitor at bedside.

## 2020-11-18 NOTE — ED Notes (Signed)
Will collect next lactic once LR bolus finished.

## 2020-11-18 NOTE — ED Notes (Signed)
LR bolus initiated and heparin gtt stopped since LR and heparin variable in compatibility. Verbal from providers at bedside to hang LR and stop heparin gtt.

## 2020-11-18 NOTE — Consult Note (Signed)
CODE SEPSIS - PHARMACY COMMUNICATION  **Broad Spectrum Antibiotics should be administered within 1 hour of Sepsis diagnosis**  Time Code Sepsis Called/Page Received: 6047  Antibiotics Ordered: Rocephin   Time of 1st antibiotic administration: 9987  Additional action taken by pharmacy: none  If necessary, Name of Provider/Nurse Contacted: n/a    Pearla Dubonnet ,PharmD Clinical Pharmacist  11/12/2020  10:14 AM

## 2020-11-19 DIAGNOSIS — A419 Sepsis, unspecified organism: Secondary | ICD-10-CM | POA: Diagnosis not present

## 2020-11-19 DIAGNOSIS — R6521 Severe sepsis with septic shock: Secondary | ICD-10-CM | POA: Diagnosis not present

## 2020-11-19 LAB — URINE CULTURE: Culture: NO GROWTH

## 2020-11-19 MED ORDER — METOPROLOL TARTRATE 25 MG PO TABS: 12.5000 mg | ORAL_TABLET | Freq: Two times a day (BID) | ORAL | Status: DC

## 2020-11-19 MED ORDER — MORPHINE SULFATE (PF) 2 MG/ML IV SOLN: 1.0000 mg | INTRAVENOUS | Status: DC | PRN

## 2020-11-19 MED ORDER — BIOTENE DRY MOUTH MT LIQD
15.0000 mL | OROMUCOSAL | Status: DC | PRN
  Filled 2020-11-19: qty 15

## 2020-11-19 MED ORDER — POLYVINYL ALCOHOL 1.4 % OP SOLN
1.0000 [drp] | Freq: Four times a day (QID) | OPHTHALMIC | Status: DC | PRN
  Filled 2020-11-19: qty 15

## 2020-11-19 MED ORDER — PANTOPRAZOLE SODIUM 40 MG PO TBEC: 40.0000 mg | DELAYED_RELEASE_TABLET | Freq: Every day | ORAL | Status: DC

## 2020-11-19 MED ORDER — GLYCOPYRROLATE 0.2 MG/ML IJ SOLN
0.2000 mg | INTRAMUSCULAR | Status: DC | PRN
  Filled 2020-11-19: qty 1

## 2020-11-19 MED ORDER — LORAZEPAM 2 MG/ML PO CONC
1.0000 mg | ORAL | Status: DC | PRN
  Filled 2020-11-19: qty 0.5

## 2020-11-19 MED ORDER — ZINC SULFATE 220 (50 ZN) MG PO CAPS: 220.0000 mg | ORAL_CAPSULE | Freq: Every day | ORAL | Status: DC

## 2020-11-19 MED ORDER — HALOPERIDOL LACTATE 5 MG/ML IJ SOLN: 0.5000 mg | INTRAMUSCULAR | Status: DC | PRN

## 2020-11-19 MED ORDER — QUETIAPINE FUMARATE 25 MG PO TABS: 25.0000 mg | ORAL_TABLET | Freq: Every day | ORAL | Status: DC

## 2020-11-19 MED ORDER — ASCORBIC ACID 500 MG PO TABS: 500.0000 mg | ORAL_TABLET | Freq: Every day | ORAL | Status: DC

## 2020-11-19 MED ORDER — ACETAMINOPHEN 325 MG PO TABS: 650.0000 mg | ORAL_TABLET | Freq: Four times a day (QID) | ORAL | Status: DC | PRN

## 2020-11-19 MED ORDER — ONDANSETRON 4 MG PO TBDP: 4.0000 mg | ORAL_TABLET | Freq: Four times a day (QID) | ORAL | Status: DC | PRN

## 2020-11-19 MED ORDER — LORAZEPAM 2 MG/ML IJ SOLN: 1.0000 mg | INTRAMUSCULAR | Status: DC | PRN

## 2020-11-19 MED ORDER — HALOPERIDOL 0.5 MG PO TABS
0.5000 mg | ORAL_TABLET | ORAL | Status: DC | PRN
  Filled 2020-11-19: qty 1

## 2020-11-19 MED ORDER — HALOPERIDOL LACTATE 2 MG/ML PO CONC
0.5000 mg | ORAL | Status: DC | PRN
  Filled 2020-11-19: qty 0.3

## 2020-11-19 MED ORDER — GLYCOPYRROLATE 1 MG PO TABS
1.0000 mg | ORAL_TABLET | ORAL | Status: DC | PRN
  Filled 2020-11-19: qty 1

## 2020-11-19 MED ORDER — ONDANSETRON HCL 4 MG/2ML IJ SOLN: 4.0000 mg | Freq: Four times a day (QID) | INTRAMUSCULAR | Status: DC | PRN

## 2020-11-19 MED ORDER — POLY-VITAMIN/IRON 10 MG/ML PO SOLN: 0.5000 mL | Freq: Every day | ORAL | Status: DC

## 2020-11-19 MED ORDER — ACETAMINOPHEN 650 MG RE SUPP: 650.0000 mg | Freq: Four times a day (QID) | RECTAL | Status: DC | PRN

## 2020-11-19 MED ORDER — LORAZEPAM 1 MG PO TABS: 1.0000 mg | ORAL_TABLET | ORAL | Status: DC | PRN

## 2020-11-23 LAB — CULTURE, BLOOD (SINGLE): Culture: NO GROWTH

## 2020-11-29 NOTE — ED Notes (Signed)
Received message from Dr Reesa Chew that she is reviewing chart before placing orders.

## 2020-11-29 NOTE — ED Notes (Signed)
Pt agonal over last 3 minutes then noted as asystole on monitor sustained. Checked pulse and auscultated with stethoscope. Heather RN confirmed with this Therapist, sports. Pt deceased. Family notified and told to take the time they need. Chaplain called.

## 2020-11-29 NOTE — ED Notes (Signed)
Per Night shift RN, pt on comfort care. This RN messaged Dr Reesa Chew via secure chat for orders.

## 2020-11-29 NOTE — ED Notes (Signed)
Chaplain at bedside. Daughter collected pt's earrings.

## 2020-11-29 NOTE — Death Summary Note (Signed)
Death Summary  [name]:[id number] DOB: [birth year] DOA: November 23, 2020  PCP: Summer Lange, NP  Admit date: 11/23/20 Date of Death: November 24, 2020 Time of Death: 12:54PM Notification: Summer Lange, NP notified of death of 11-24-2020   History of present illness:  [name] is a 85 y.o. female with a history of HLD, skin CA, NASH and liver cirrhosis, PE and A fib on Eliquis, tobacco abuse, CKD-IV,CAD [name] presented with complaint of AMS, chest pain and abdominal pain.  Found to have A. fib with RVR with markedly elevated troponin consistent with NSTEMI.  Cardiology was consulted and she was initially started on heparin infusion. Patient also developed cardiogenic shock requiring pressor support. Remained unresponsive.  As she continued to get worse despite maximum pressor support, family decided to withdraw treatment and just keep her comfortable.  Patient had very poor functional status at baseline. End-of-life comfort measures were started and she passed peacefully with the presence of multiple family members.  Final Diagnoses:  1.   Cardiogenic shock with NSTEMI   The results of significant diagnostics from this hospitalization (including imaging, microbiology, ancillary and laboratory) are listed below for reference.    Significant Diagnostic Studies: CT HEAD WO CONTRAST  Result Date: 11-23-2020 CLINICAL DATA:  Delirium. EXAM: CT HEAD WITHOUT CONTRAST TECHNIQUE: Contiguous axial images were obtained from the base of the skull through the vertex without intravenous contrast. COMPARISON:  Most recent head CT 06/29/2020 FINDINGS: Brain: Stable degree of atrophy and chronic small vessel ischemia. No intracranial hemorrhage, mass effect, or midline shift. No hydrocephalus. The basilar cisterns are patent. No evidence of territorial infarct or acute ischemia. No extra-axial or intracranial fluid collection. Vascular: Atherosclerosis of skullbase vasculature  without hyperdense vessel or abnormal calcification. Skull: No fracture or focal lesion. Sinuses/Orbits: Paranasal sinuses and mastoid air cells are clear. The visualized orbits are unremarkable. Bilateral cataract resection. Other: None. IMPRESSION: 1. No acute intracranial abnormality. 2. Stable atrophy and chronic small vessel ischemia. Electronically Signed   By: Keith Rake M.D.   On: 23-Nov-2020 14:58   DG Chest Portable 1 View  Result Date: November 23, 2020 CLINICAL DATA:  85 year old female with chest pain, atrial fibrillation, RVR. EXAM: PORTABLE CHEST 1 VIEW COMPARISON:  Chest radiographs 06/29/2020 and earlier. FINDINGS: Portable AP semi upright view at 0822 hours. Mildly rotated to the left. Stable lung volumes and mediastinal contours, with borderline to mild cardiomegaly. Visualized tracheal air column is within normal limits. No pneumothorax, pulmonary edema or definite effusion. There is an oval opacity at the right hilum which might be artifact or segmental atelectasis. Negative visible bowel gas pattern. Chronic right axillary and chest wall surgical clips. No acute osseous abnormality identified. IMPRESSION: 1. Oval opacity at the right hilum might be artifact or segmental atelectasis. PA and lateral views of the chest would be helpful when possible. 2. No other acute cardiopulmonary abnormality. Electronically Signed   By: Genevie Ann M.D.   On: 2020/11/23 08:49   CT Renal Stone Study  Result Date: 11-23-2020 CLINICAL DATA:  85 year old female with renal insufficiency, UTI. Pain. Atrial fibrillation EXAM: CT ABDOMEN AND PELVIS WITHOUT CONTRAST TECHNIQUE: Multidetector CT imaging of the abdomen and pelvis was performed following the standard protocol without IV contrast. COMPARISON:  CT Abdomen and Pelvis 06/12/2020 and earlier. FINDINGS: Lower chest: Stable mild cardiomegaly. No pericardial effusion. Moderate bilateral layering pleural effusions are increased since January with associated  compressive lung base atelectasis. Focal atelectasis also suspected in the right lateral costophrenic angle.  Chronic postoperative changes to the right lower breast tissue appear stable since January. Hepatobiliary: Nodular, cirrhotic liver. No discrete liver lesion in the absence of contrast. Chronically absent gallbladder. Perihepatic ascites is small to moderate and stable since January. Pancreas: Atrophied. Spleen: Decreased spleen size since January. Small volume perisplenic ascites has increased. Adrenals/Urinary Tract: Normal adrenal glands. Nonobstructed kidneys. Occasional renal cysts. No nephrolithiasis. Decompressed ureters. Diminutive urinary bladder. Stomach/Bowel: Increased retained stool in the rectum but no dilated large or small bowel. Diverticulosis in the distal descending and sigmoid colon7. Occasional diverticula7 in the transverse colon.a decompressed and negative terminal ileum. Unremarkable stomach and duodenum. No free air. Vascular/Lymphatic: Aortoiliac calcified atherosclerosis. Normal caliber abdominal aorta. Vascular patency is not evaluated in the absence of IV contrast. Reproductive: Negative noncontrast appearance. Other: Moderate volume pelvic ascites has mildly increased. Musculoskeletal: Diffuse osteopenia with multilevel chronic lumbar fusion. No acute osseous abnormality identified. Increased bilateral body wall edema since January. There is a broad-based area of abnormal right ventral abdominal wall subcutaneous gas (coronal image 20) extending to the superficial muscle layer in an area of 10 cm or so. No regional soft tissue fluid, and also on lung windows there appears to be an implanted subcutaneous device here. See series 2, image 52. IMPRESSION: 1. Anasarca and moderate layering pleural effusions are increased since January. 2. Chronic Cirrhosis with small to moderate volume pelvic ascites, increased since January. Small volume abdominal ascites has not significantly  changed. Decreased spleen size since January. 3. No obstructive uropathy. 4. A 10 cm area of right side ventral abdominal wall soft tissue gas appears related to an implanted device, perhaps a glucose monitor. Please correlate clinically. 5. Aortic Atherosclerosis (ICD10-I70.0). 6. Stable postoperative appearance of the visible lower right breast. Electronically Signed   By: Genevie Ann M.D.   On: 11/17/2020 10:33    Microbiology: Recent Results (from the past 240 hour(s))  Urine Culture     Status: None   Collection Time: 11/16/2020  9:00 AM   Specimen: Urine, Random  Result Value Ref Range Status   Specimen Description   Final    URINE, RANDOM Performed at [facility], [address], [place], [place] [address]    Special Requests   Final    NONE Performed at [facility], [address] [place], [place] [address]    Culture   Final    NO GROWTH Performed at [facility], [place] [address], [place], [place] [address]    Report Status Nov 23, 2020 FINAL  Final  Blood culture (single)     Status: None (Preliminary result)   Collection Time: 11/09/2020 10:35 AM   Specimen: BLOOD  Result Value Ref Range Status   Specimen Description BLOOD BLOOD RIGHT HAND  Final   Special Requests   Final    BOTTLES DRAWN AEROBIC ONLY Blood Culture results may not be optimal due to an inadequate volume of blood received in culture bottles   Culture   Final    NO GROWTH < 24 HOURS Performed at [facility], [address], [place], [place] [address]    Report Status PENDING  Incomplete  Resp Panel by RT-PCR (Flu A&B, Covid) Nasopharyngeal Swab     Status: None   Collection Time: 11/17/2020 10:56 AM   Specimen: Nasopharyngeal Swab; Nasopharyngeal(NP) swabs in vial transport medium  Result Value Ref Range Status   SARS Coronavirus 2 by RT PCR NEGATIVE NEGATIVE Final    Comment: (NOTE) SARS-CoV-2 target nucleic acids are  NOT DETECTED.  The SARS-CoV-2 RNA is generally  detectable in upper respiratory specimens during the acute phase of infection. The lowest concentration of SARS-CoV-2 viral copies this assay can detect is 138 copies/mL. A negative result does not preclude SARS-Cov-2 infection and should not be used as the sole basis for treatment or other patient management decisions. A negative result may occur with  improper specimen collection/handling, submission of specimen other than nasopharyngeal swab, presence of viral mutation(s) within the areas targeted by this assay, and inadequate number of viral copies(<138 copies/mL). A negative result must be combined with clinical observations, patient history, and epidemiological information. The expected result is Negative.  Fact Sheet for Patients:  EntrepreneurPulse.com.au  Fact Sheet for Healthcare Providers:  IncredibleEmployment.be  This test is no t yet approved or cleared by the [place] FDA and  has been authorized for detection and/or diagnosis of SARS-CoV-2 by FDA under an Emergency Use Authorization (EUA). This EUA will remain  in effect (meaning this test can be used) for the duration of the COVID-19 declaration under Section 564(b)(1) of the Act, 21 U.S.C.section 360bbb-3(b)(1), unless the authorization is terminated  or revoked sooner.       Influenza A by PCR NEGATIVE NEGATIVE Final   Influenza B by PCR NEGATIVE NEGATIVE Final    Comment: (NOTE) The Xpert Xpress SARS-CoV-2/FLU/RSV plus assay is intended as an aid in the diagnosis of influenza from Nasopharyngeal swab specimens and should not be used as a sole basis for treatment. Nasal washings and aspirates are unacceptable for Xpert Xpress SARS-CoV-2/FLU/RSV testing.  Fact Sheet for Patients: EntrepreneurPulse.com.au  Fact Sheet for Healthcare Providers: IncredibleEmployment.be  This test is not yet approved or cleared by the [place] FDA  and has been authorized for detection and/or diagnosis of SARS-CoV-2 by FDA under an Emergency Use Authorization (EUA). This EUA will remain in effect (meaning this test can be used) for the duration of the COVID-19 declaration under Section 564(b)(1) of the Act, 21 U.S.C. section 360bbb-3(b)(1), unless the authorization is terminated or revoked.  Performed at [facility], [address] [place], [place] [address]      Labs: Basic Metabolic Panel: Recent Labs  Lab 10/30/2020 0821  NA 137  K 4.7  CL 112*  CO2 13*  GLUCOSE 94  BUN 88*  CREATININE 3.29*  CALCIUM 8.0*  MG 1.8   Liver Function Tests: No results for input(s): AST, ALT, ALKPHOS, BILITOT, PROT, ALBUMIN in the last 168 hours. No results for input(s): LIPASE, AMYLASE in the last 168 hours. Recent Labs  Lab 11/15/2020 1153  AMMONIA 13   CBC: Recent Labs  Lab 11/21/2020 0821  WBC 12.5*  NEUTROABS 9.0*  HGB 11.4*  HCT 35.3*  MCV 100.3*  PLT 300   Cardiac Enzymes: No results for input(s): CKTOTAL, CKMB, CKMBINDEX, TROPONINI in the last 168 hours. D-Dimer No results for input(s): DDIMER in the last 72 hours. BNP: Invalid input(s): POCBNP CBG: No results for input(s): GLUCAP in the last 168 hours. Anemia work up No results for input(s): VITAMINB12, FOLATE, FERRITIN, TIBC, IRON, RETICCTPCT in the last 72 hours. Urinalysis    Component Value Date/Time   COLORURINE AMBER (A) 11/02/2020 0900   APPEARANCEUR CLOUDY (A) 11/28/2020 0900   APPEARANCEUR Cloudy (A) 11/28/2014 1456   LABSPEC 1.013 11/14/2020 0900   PHURINE 5.0 11/21/2020 0900   GLUCOSEU NEGATIVE 11/07/2020 0900   HGBUR LARGE (A) 11/21/2020 0900   BILIRUBINUR NEGATIVE 11/03/2020 0900   BILIRUBINUR Negative 11/28/2014 1456  KETONESUR NEGATIVE 11/10/2020 0900   PROTEINUR 100 (A) 11/17/2020 0900   NITRITE NEGATIVE 10/31/2020 0900   LEUKOCYTESUR MODERATE (A) 11/25/2020 0900   Sepsis Labs Invalid input(s): PROCALCITONIN,  WBC,   LACTICIDVEN   SIGNED:  Lorella Nimrod, MD  [facility] 12-11-20, 1:13 PM Pager   If 7PM-7AM, please contact night-coverage [email or web address] Password [email or web address]  This record has been created using Systems analyst. Errors have been sought and corrected,but may not always be located. Such creation errors do not reflect on the standard of care.

## 2020-11-29 NOTE — ED Notes (Signed)
Family requested bp cuff not to be on pt d/t pt skin tearing easily.

## 2020-11-29 NOTE — ED Notes (Signed)
Pt ready to be transported to morgue. Facilities manager at bedside.

## 2020-11-29 NOTE — ED Notes (Signed)
Family remains at bedside. Once family done saying goodbyes this RN will remove IVs and have transport contacted for morgue. Funeral arrangements sheet filled out by family.

## 2020-11-29 NOTE — ED Notes (Signed)
Checked on  Pt and family at this time. Pt looks comfortable and is in no distress. Family doesn't need anything at this moment

## 2020-11-29 NOTE — ED Notes (Signed)
Non-adhesive dressing applied over skin tear at R forearm.

## 2020-11-29 NOTE — ED Notes (Signed)
Per attending Reesa Chew S full set of vitals to be completed once daily and pt confirmed comfort care.

## 2020-11-29 NOTE — ED Notes (Addendum)
Dr Reesa Chew at bedside, Dr Reesa Chew placed order that RN may pronounce death, Dr Reesa Chew states this is comfort care orders.

## 2020-11-29 NOTE — ED Notes (Signed)
Messaged provider Latina Craver via secure chat about comfort care orders as intensivist Lissa Merlin and provider Mansy talked with family last night about option for comfort care in which family requested pt be taken off levophed and fluids and requested be placed on pain meds as needed. However, neither provider ever officially placed orders for "comfort care". Verbal from [facility] to d/c levophed and fluids per family's wishes. Mansy also placed PRN pain med orders per family's wishes. Previous RN Bubba Hales removed BP cuff per family's wishes as they were worried it would cut up pt's skin bc it continues to weep fluid heavily. Awaiting orders from attending on how often BP to be taken with this in mind.

## 2020-11-29 NOTE — ED Notes (Signed)
Pt HR 33, not responsive to verbal stimuli. Dr Reesa Chew paged for orders

## 2020-11-29 NOTE — ED Notes (Signed)
Pt blankets, gown, chux changed. Repositioned in bed.

## 2020-11-29 NOTE — ED Notes (Signed)
Pt does not appear in pain or uncomfortable at this time. Family at bedside, denies needs

## 2020-11-29 NOTE — ED Notes (Signed)
Pt given blankets.  Pt Opened eyes for a moment. Advised family to let me know if they needed anything else

## 2020-11-29 DEATH — deceased
